# Patient Record
Sex: Male | Born: 1935 | Race: Black or African American | Hispanic: No | Marital: Married | Smoking: Former smoker
Health system: Southern US, Community
[De-identification: ages and names within clinical notes are randomized; demographics above are authoritative.]

## PROBLEM LIST (undated history)

## (undated) DIAGNOSIS — I428 Other cardiomyopathies: Secondary | ICD-10-CM

## (undated) DIAGNOSIS — Z9289 Personal history of other medical treatment: Secondary | ICD-10-CM

## (undated) DIAGNOSIS — IMO0001 Reserved for inherently not codable concepts without codable children: Secondary | ICD-10-CM

## (undated) DIAGNOSIS — K635 Polyp of colon: Secondary | ICD-10-CM

## (undated) DIAGNOSIS — M1990 Unspecified osteoarthritis, unspecified site: Secondary | ICD-10-CM

## (undated) DIAGNOSIS — C61 Malignant neoplasm of prostate: Secondary | ICD-10-CM

## (undated) DIAGNOSIS — J189 Pneumonia, unspecified organism: Secondary | ICD-10-CM

## (undated) DIAGNOSIS — C169 Malignant neoplasm of stomach, unspecified: Principal | ICD-10-CM

## (undated) DIAGNOSIS — D649 Anemia, unspecified: Secondary | ICD-10-CM

## (undated) DIAGNOSIS — I10 Essential (primary) hypertension: Secondary | ICD-10-CM

## (undated) DIAGNOSIS — C163 Malignant neoplasm of pyloric antrum: Secondary | ICD-10-CM

## (undated) DIAGNOSIS — E785 Hyperlipidemia, unspecified: Secondary | ICD-10-CM

## (undated) DIAGNOSIS — I5022 Chronic systolic (congestive) heart failure: Secondary | ICD-10-CM

## (undated) DIAGNOSIS — I447 Left bundle-branch block, unspecified: Secondary | ICD-10-CM

## (undated) DIAGNOSIS — J449 Chronic obstructive pulmonary disease, unspecified: Secondary | ICD-10-CM

## (undated) DIAGNOSIS — Z95810 Presence of automatic (implantable) cardiac defibrillator: Secondary | ICD-10-CM

## (undated) HISTORY — DX: Chronic obstructive pulmonary disease, unspecified: J44.9

## (undated) HISTORY — DX: Hyperlipidemia, unspecified: E78.5

## (undated) HISTORY — DX: Essential (primary) hypertension: I10

## (undated) HISTORY — DX: Polyp of colon: K63.5

## (undated) HISTORY — PX: COLONOSCOPY: SHX174

## (undated) HISTORY — DX: Malignant neoplasm of stomach, unspecified: C16.9

## (undated) HISTORY — DX: Malignant neoplasm of prostate: C61

## (undated) HISTORY — DX: Unspecified osteoarthritis, unspecified site: M19.90

## (undated) HISTORY — PX: PROSTATECTOMY: SHX69

---

## 2000-06-01 ENCOUNTER — Encounter: Admission: RE | Admit: 2000-06-01 | Discharge: 2000-06-01 | Payer: Self-pay | Admitting: Family Medicine

## 2000-06-01 ENCOUNTER — Encounter: Payer: Self-pay | Admitting: Family Medicine

## 2003-07-05 ENCOUNTER — Ambulatory Visit (HOSPITAL_COMMUNITY): Admission: RE | Admit: 2003-07-05 | Discharge: 2003-07-06 | Payer: Self-pay | Admitting: Internal Medicine

## 2004-07-16 ENCOUNTER — Ambulatory Visit: Payer: Self-pay | Admitting: Internal Medicine

## 2004-08-22 ENCOUNTER — Ambulatory Visit: Payer: Self-pay | Admitting: Internal Medicine

## 2004-09-06 ENCOUNTER — Ambulatory Visit: Payer: Self-pay | Admitting: Internal Medicine

## 2004-12-11 ENCOUNTER — Ambulatory Visit: Payer: Self-pay | Admitting: Internal Medicine

## 2006-06-03 ENCOUNTER — Ambulatory Visit: Payer: Self-pay | Admitting: Internal Medicine

## 2006-11-15 ENCOUNTER — Inpatient Hospital Stay (HOSPITAL_COMMUNITY): Admission: EM | Admit: 2006-11-15 | Discharge: 2006-11-19 | Payer: Self-pay | Admitting: *Deleted

## 2006-11-16 ENCOUNTER — Encounter (INDEPENDENT_AMBULATORY_CARE_PROVIDER_SITE_OTHER): Payer: Self-pay | Admitting: Internal Medicine

## 2006-12-01 ENCOUNTER — Encounter: Admission: RE | Admit: 2006-12-01 | Discharge: 2006-12-01 | Payer: Self-pay | Admitting: Family Medicine

## 2007-06-02 DIAGNOSIS — J42 Unspecified chronic bronchitis: Secondary | ICD-10-CM

## 2007-06-02 DIAGNOSIS — J449 Chronic obstructive pulmonary disease, unspecified: Secondary | ICD-10-CM

## 2007-06-03 ENCOUNTER — Ambulatory Visit: Payer: Self-pay | Admitting: Internal Medicine

## 2008-04-05 ENCOUNTER — Ambulatory Visit: Payer: Self-pay | Admitting: Internal Medicine

## 2008-04-05 DIAGNOSIS — E785 Hyperlipidemia, unspecified: Secondary | ICD-10-CM

## 2008-04-05 DIAGNOSIS — I10 Essential (primary) hypertension: Secondary | ICD-10-CM | POA: Insufficient documentation

## 2008-04-05 DIAGNOSIS — Z860100 Personal history of colon polyps, unspecified: Secondary | ICD-10-CM | POA: Insufficient documentation

## 2008-04-05 LAB — CONVERTED CEMR LAB: HDL goal, serum: 40 mg/dL

## 2008-04-19 ENCOUNTER — Ambulatory Visit: Payer: Self-pay | Admitting: Internal Medicine

## 2008-04-27 ENCOUNTER — Ambulatory Visit: Payer: Self-pay | Admitting: Internal Medicine

## 2008-04-27 DIAGNOSIS — Z8546 Personal history of malignant neoplasm of prostate: Secondary | ICD-10-CM | POA: Insufficient documentation

## 2008-04-27 DIAGNOSIS — E118 Type 2 diabetes mellitus with unspecified complications: Secondary | ICD-10-CM | POA: Insufficient documentation

## 2008-04-27 DIAGNOSIS — I447 Left bundle-branch block, unspecified: Secondary | ICD-10-CM | POA: Insufficient documentation

## 2008-05-04 ENCOUNTER — Ambulatory Visit: Payer: Self-pay

## 2008-05-05 DIAGNOSIS — C61 Malignant neoplasm of prostate: Secondary | ICD-10-CM

## 2008-05-05 HISTORY — PX: CATARACT EXTRACTION W/ INTRAOCULAR LENS  IMPLANT, BILATERAL: SHX1307

## 2008-05-05 HISTORY — PX: ROBOT ASSISTED LAPAROSCOPIC RADICAL PROSTATECTOMY: SHX5141

## 2008-05-05 HISTORY — DX: Malignant neoplasm of prostate: C61

## 2008-05-11 ENCOUNTER — Telehealth: Payer: Self-pay | Admitting: Internal Medicine

## 2008-05-17 ENCOUNTER — Telehealth: Payer: Self-pay | Admitting: Internal Medicine

## 2008-05-31 ENCOUNTER — Ambulatory Visit: Payer: Self-pay | Admitting: Internal Medicine

## 2008-06-05 ENCOUNTER — Encounter: Payer: Self-pay | Admitting: Internal Medicine

## 2008-06-09 ENCOUNTER — Encounter: Payer: Self-pay | Admitting: Internal Medicine

## 2008-07-04 ENCOUNTER — Ambulatory Visit: Admission: RE | Admit: 2008-07-04 | Discharge: 2008-08-17 | Payer: Self-pay | Admitting: Radiation Oncology

## 2008-07-05 ENCOUNTER — Encounter: Payer: Self-pay | Admitting: Internal Medicine

## 2008-07-10 ENCOUNTER — Ambulatory Visit (HOSPITAL_COMMUNITY): Admission: RE | Admit: 2008-07-10 | Discharge: 2008-07-10 | Payer: Self-pay | Admitting: Urology

## 2008-08-02 ENCOUNTER — Inpatient Hospital Stay (HOSPITAL_COMMUNITY): Admission: RE | Admit: 2008-08-02 | Discharge: 2008-08-03 | Payer: Self-pay | Admitting: Urology

## 2008-08-02 ENCOUNTER — Encounter (INDEPENDENT_AMBULATORY_CARE_PROVIDER_SITE_OTHER): Payer: Self-pay | Admitting: Urology

## 2008-09-26 ENCOUNTER — Ambulatory Visit: Payer: Self-pay | Admitting: Internal Medicine

## 2008-09-28 ENCOUNTER — Ambulatory Visit: Payer: Self-pay | Admitting: Internal Medicine

## 2008-10-12 ENCOUNTER — Ambulatory Visit: Payer: Self-pay | Admitting: Internal Medicine

## 2008-10-26 ENCOUNTER — Ambulatory Visit: Payer: Self-pay | Admitting: Internal Medicine

## 2008-10-26 ENCOUNTER — Encounter: Payer: Self-pay | Admitting: Internal Medicine

## 2008-10-26 LAB — HM COLONOSCOPY

## 2008-10-28 ENCOUNTER — Encounter: Payer: Self-pay | Admitting: Internal Medicine

## 2009-05-02 ENCOUNTER — Ambulatory Visit: Payer: Self-pay | Admitting: Internal Medicine

## 2009-05-02 DIAGNOSIS — E8881 Metabolic syndrome: Secondary | ICD-10-CM | POA: Insufficient documentation

## 2009-05-03 ENCOUNTER — Encounter: Payer: Self-pay | Admitting: Internal Medicine

## 2009-06-04 ENCOUNTER — Ambulatory Visit: Payer: Self-pay | Admitting: Internal Medicine

## 2009-06-04 DIAGNOSIS — J019 Acute sinusitis, unspecified: Secondary | ICD-10-CM | POA: Insufficient documentation

## 2009-07-16 ENCOUNTER — Telehealth: Payer: Self-pay | Admitting: Internal Medicine

## 2009-07-17 ENCOUNTER — Telehealth: Payer: Self-pay | Admitting: Internal Medicine

## 2009-07-20 ENCOUNTER — Ambulatory Visit: Payer: Self-pay | Admitting: Internal Medicine

## 2009-08-15 ENCOUNTER — Telehealth (INDEPENDENT_AMBULATORY_CARE_PROVIDER_SITE_OTHER): Payer: Self-pay | Admitting: *Deleted

## 2009-08-30 ENCOUNTER — Ambulatory Visit: Payer: Self-pay | Admitting: Internal Medicine

## 2009-11-29 ENCOUNTER — Telehealth: Payer: Self-pay | Admitting: Internal Medicine

## 2009-12-03 ENCOUNTER — Ambulatory Visit: Payer: Self-pay | Admitting: Internal Medicine

## 2009-12-26 ENCOUNTER — Encounter: Payer: Self-pay | Admitting: Internal Medicine

## 2010-01-17 ENCOUNTER — Ambulatory Visit: Payer: Self-pay | Admitting: Internal Medicine

## 2010-05-07 ENCOUNTER — Ambulatory Visit
Admission: RE | Admit: 2010-05-07 | Discharge: 2010-05-07 | Payer: Self-pay | Source: Home / Self Care | Attending: Internal Medicine | Admitting: Internal Medicine

## 2010-05-07 ENCOUNTER — Encounter: Payer: Self-pay | Admitting: Internal Medicine

## 2010-05-07 LAB — CONVERTED CEMR LAB
BUN: 15 mg/dL (ref 6–23)
Basophils Absolute: 0 10*3/uL (ref 0.0–0.1)
Chloride: 103 meq/L (ref 96–112)
Cholesterol: 166 mg/dL (ref 0–200)
Creatinine,U: 105.1 mg/dL
Eosinophils Absolute: 0.1 10*3/uL (ref 0.0–0.7)
Glucose, Bld: 103 mg/dL — ABNORMAL HIGH (ref 70–99)
HCT: 38.6 % — ABNORMAL LOW (ref 39.0–52.0)
Hgb A1c MFr Bld: 6.8 % — ABNORMAL HIGH (ref 4.6–6.5)
Ketones, ur: NEGATIVE mg/dL
Lymphs Abs: 1.4 10*3/uL (ref 0.7–4.0)
MCV: 82.9 fL (ref 78.0–100.0)
Microalb, Ur: 2.9 mg/dL — ABNORMAL HIGH (ref 0.0–1.9)
Monocytes Absolute: 0.4 10*3/uL (ref 0.1–1.0)
Platelets: 206 10*3/uL (ref 150.0–400.0)
Potassium: 4.2 meq/L (ref 3.5–5.1)
RDW: 15.5 % — ABNORMAL HIGH (ref 11.5–14.6)
Specific Gravity, Urine: 1.02 (ref 1.000–1.030)
TSH: 1.71 microintl units/mL (ref 0.35–5.50)
Total Bilirubin: 0.6 mg/dL (ref 0.3–1.2)
Triglycerides: 102 mg/dL (ref 0.0–149.0)
Urine Glucose: NEGATIVE mg/dL
Urobilinogen, UA: 0.2 (ref 0.0–1.0)
VLDL: 20.4 mg/dL (ref 0.0–40.0)

## 2010-05-08 ENCOUNTER — Encounter: Payer: Self-pay | Admitting: Internal Medicine

## 2010-06-02 LAB — CONVERTED CEMR LAB
ALT: 19 units/L (ref 0–53)
Albumin: 3.9 g/dL (ref 3.5–5.2)
Alkaline Phosphatase: 78 units/L (ref 39–117)
Alkaline Phosphatase: 84 units/L (ref 39–117)
BUN: 10 mg/dL (ref 6–23)
BUN: 13 mg/dL (ref 6–23)
Basophils Absolute: 0 10*3/uL (ref 0.0–0.1)
Basophils Relative: 1.1 % (ref 0.0–3.0)
Bilirubin, Direct: 0.1 mg/dL (ref 0.0–0.3)
Bilirubin, Direct: 0.2 mg/dL (ref 0.0–0.3)
Calcium: 9.2 mg/dL (ref 8.4–10.5)
Calcium: 9.3 mg/dL (ref 8.4–10.5)
Cholesterol: 163 mg/dL (ref 0–200)
Creatinine, Ser: 1.1 mg/dL (ref 0.4–1.5)
Crystals: NEGATIVE
Eosinophils Absolute: 0.1 10*3/uL (ref 0.0–0.7)
Eosinophils Relative: 1.7 % (ref 0.0–5.0)
GFR calc Af Amer: 95 mL/min
GFR calc non Af Amer: 78 mL/min
GFR calc non Af Amer: 84.61 mL/min (ref >60)
Glucose, Bld: 115 mg/dL — ABNORMAL HIGH (ref 70–99)
HCT: 41.7 % (ref 39.0–52.0)
HDL: 38.4 mg/dL — ABNORMAL LOW (ref >39.0)
Hemoglobin, Urine: NEGATIVE
Hgb A1c MFr Bld: 6.5 % (ref 4.6–6.5)
Hgb A1c MFr Bld: 6.7 % — ABNORMAL HIGH (ref 4.6–6.0)
Ketones, ur: NEGATIVE mg/dL
LDL Cholesterol: 92 mg/dL (ref 0–99)
Leukocytes, UA: NEGATIVE
Leukocytes, UA: NEGATIVE
Lymphocytes Relative: 35.3 % (ref 12.0–46.0)
MCHC: 33.3 g/dL (ref 30.0–36.0)
MCV: 83.5 fL (ref 78.0–100.0)
Microalb Creat Ratio: 10.6 mg/g (ref 0.0–30.0)
Microalb, Ur: 1.9 mg/dL (ref 0.0–1.9)
Monocytes Absolute: 0.4 10*3/uL (ref 0.1–1.0)
Monocytes Absolute: 0.5 10*3/uL (ref 0.1–1.0)
Monocytes Relative: 10.5 % (ref 3.0–12.0)
Neutrophils Relative %: 45.8 % (ref 43.0–77.0)
Neutrophils Relative %: 51.4 % (ref 43.0–77.0)
Nitrite: NEGATIVE
Platelets: 218 10*3/uL (ref 150–400)
Platelets: 225 10*3/uL (ref 150.0–400.0)
Potassium: 4.2 meq/L (ref 3.5–5.1)
RBC / HPF: NONE SEEN
RBC: 4.8 M/uL (ref 4.22–5.81)
RDW: 13.7 % (ref 11.5–14.6)
Sodium: 140 meq/L (ref 135–145)
Specific Gravity, Urine: 1.02 (ref 1.000–1.03)
Specific Gravity, Urine: 1.02 (ref 1.000–1.030)
TSH: 1.14 microintl units/mL (ref 0.35–5.50)
Total Bilirubin: 0.5 mg/dL (ref 0.3–1.2)
Total CHOL/HDL Ratio: 4
Total Protein, Urine: NEGATIVE mg/dL
Total Protein: 7 g/dL (ref 6.0–8.3)
Triglycerides: 116 mg/dL (ref 0–149)
Triglycerides: 141 mg/dL (ref 0.0–149.0)
Urobilinogen, UA: 0.2 (ref 0.0–1.0)
VLDL: 23 mg/dL (ref 0–40)
VLDL: 28.2 mg/dL (ref 0.0–40.0)
WBC: 3.8 10*3/uL — ABNORMAL LOW (ref 4.5–10.5)
WBC: 4.3 10*3/uL — ABNORMAL LOW (ref 4.5–10.5)
pH: 5.5 (ref 5.0–8.0)

## 2010-06-03 ENCOUNTER — Ambulatory Visit: Admit: 2010-06-03 | Payer: Self-pay | Admitting: Internal Medicine

## 2010-06-04 NOTE — Assessment & Plan Note (Signed)
Summary: F/U ///KP   Primary Provider/Referring Provider:  Etta Grandchild MD  CC:  Follow up visit-COPD and Chronic Bronchitis-SOB and wheezing at times-usually with activity..  History of Present Illness: 06/03/07 History of Present Illness: Asthma/ copd FEV1/FVC 0.59 08/22/04 1 year follow up- Had double pneumonia hosp 4 days last July. Says wife c/o his snore. He denies daytime sleepiness.. Asleep watching TV at night.He will ask her if he stops breathing. Using Combivent as needed about once a day currently. No fever, but has cough productive trace yellow- saw trace of heme? today. Last cxr was during pneumonia during the summer. No chest pain.  July 20, 2009- Asthma/ COPD Wife called complaining that for the last 3 weeks or so he has been raspy and wheezing especially at night. He had a chest cold starting around then and says this pattern is not new for him when he catches a cold. Coughs a little clear mucus. He snores some but this is different. Denies significant dyspnea, chest pain or palpitations, edema or reflux.  August 30, 2009- Asthma/ COPD Wife sends him in complaining that lying down he has to clear his throat a lot. Antibiotics didn't help.  Sitting in church may bring this out as well. Uses cough drops. He doesn't recognize reflux or postnasal drip. He thinks an inhaler might have helped long ago. Denies getting hoarse. Sleeps on 2 pilows.  December 03, 2009- Asthma/ COPD He says since last here the chest congestion/ wheeze have cleared up and he is doing well now. The rescue inhaler seemed to make  the difference and was used regularly at first, then only occasionally. He has now run out  He sleeps on 2 pillows, without raising the HOB. We had suggested this as a way to explore whether acid reflux was causing his bronchitis.   Preventive Screening-Counseling & Management  Alcohol-Tobacco     Alcohol drinks/day: 0     Smoking Status: quit     Year Quit: 2004     Pack  years: 40 years  1 pack a day     Passive Smoke Exposure: no  Current Medications (verified): 1)  Bayer Low Strength 81 Mg  Tbec (Aspirin) .... Take 1 Tablet By Mouth Once A Day 2)  Azor 10-40 Mg Tabs (Amlodipine-Olmesartan) .... Once Daily 3)  Lipitor 10 Mg Tabs (Atorvastatin Calcium) .... Take 1 Tablet By Mouth Once A Day 4)  Centrum Silver  Tabs (Multiple Vitamins-Minerals) 5)  Vitamin E 400 Unit Caps (Vitamin E) .... Take 1 By Mouth Once Daily 6)  Folic Acid 400 Mcg Tabs (Folic Acid) .... Every Evening  Allergies (verified): 1)  ! Ace Inhibitors  Past History:  Past Medical History: Last updated: 07/20/2009 Asthma COPD FEV1 2.68/ 72%, FEV1/FVC 0.63 (08/22/04) Colonic polyps, hx of Hyperlipidemia Hypertension  Past Surgical History: Last updated: 07/20/2009 Prostatectomy- Robot laparoscopic- for prostate cancer 2010  Family History: Last updated: 07/20/2009 Family History of Arthritis Family History of Prostate CA 1st degree relative <50 Father- died prostate cancer, colon cancer Mother- esophageal stricture- died  Social History: Last updated: 04/05/2008 Patient states former smoker.  Married Alcohol use-no Drug use-no Regular exercise-no  Risk Factors: Alcohol Use: 0 (12/03/2009) Exercise: no (04/05/2008)  Risk Factors: Smoking Status: quit (12/03/2009) Passive Smoke Exposure: no (12/03/2009)  Review of Systems      See HPI       The patient complains of shortness of breath with activity.  The patient denies shortness of breath at  rest, productive cough, non-productive cough, coughing up blood, chest pain, irregular heartbeats, acid heartburn, indigestion, loss of appetite, weight change, abdominal pain, difficulty swallowing, sore throat, tooth/dental problems, headaches, nasal congestion/difficulty breathing through nose, and sneezing.         Dyspneic using a pushmower  Vital Signs:  Patient profile:   76 year old male Height:      72.5  inches Weight:      251.25 pounds BMI:     33.73 O2 Sat:      100 % on Room air Pulse rate:   65 / minute BP sitting:   124 / 70  (left arm) Cuff size:   large  Vitals Entered By: Reynaldo Minium CMA (December 03, 2009 1:32 PM)  O2 Flow:  Room air CC: Follow up visit-COPD and Chronic Bronchitis-SOB and wheezing at times-usually with activity.   Physical Exam  Additional Exam:  General: A/Ox3; pleasant and cooperative, NAD, He seem sconfortable and casual today. SKIN: no rash, lesions NODES: no lymphadenopathy HEENT: [place]/AT, EOM- WNL, Conjuctivae- clear, PERRLA, TM-WNL, Nose- clear, Throat- clear and wnl, dentures, Mallampati  II NECK: Supple w/ fair ROM, JVD-trace, normal carotid impulses w/o bruits Thyroid- normal to palpation, no hoarseness or stridor. CHEST:mild wheeze right upper back, unlabored without cough HEART: RRR, no m/g/r heard, ABDOMEN: Soft and nl;  AVW:UJWJ, nl pulses, no edema  NEURO: Grossly intact to observation    .   Impression & Recommendations:  Problem # 1:  COPD (ICD-496) Chronic bronchitis. There is an asthma component which should be controlled ok for now with a rescue inhaler. He is worried about the cost and we discussed this along with options.   Problem # 2:  SINUSITIS- ACUTE-NOS (ICD-461.9) Sinusitis has resolved and is asymptomatic now. We discussed recurrence, and availability of Neti pot.  Medications Added to Medication List This Visit: 1)  Proair Hfa 108 (90 Base) Mcg/act Aers (Albuterol sulfate) .... 2 puffs four times a day as needed rescue  Other Orders: Est. Patient Level III (19147)  Patient Instructions: 1)  Please schedule a follow-up appointment in 6 months. 2)  Script for rescue inhaler Proair/ albuterol. Sample if available. Prescriptions: PROAIR HFA 108 (90 BASE) MCG/ACT AERS (ALBUTEROL SULFATE) 2 puffs four times a day as needed rescue  #1 x prn   Entered and Authorized by:   Waymon Budge MD   Signed by:   Waymon Budge MD on 12/03/2009   Method used:   Print then Give to Patient   RxID:   [id number]

## 2010-06-04 NOTE — Assessment & Plan Note (Signed)
Summary: COUGHING/ MBW   Primary Provider/Referring Provider:  Etta Grandchild MD  CC:  Coughing-prod at times-yellow in color; Per Spouse pt is wheezing more at night. .  History of Present Illness: 06/03/07 History of Present Illness: Asthma/ copd FEV1/FVC 0.59 08/22/04 1 year follow up- Had double pneumonia hosp 4 days last July. Says wife c/o his snore. He denies daytime sleepiness.. Asleep watching TV at night.He will ask her if he stops breathing. Using Combivent as needed about once a day currently. No fever, but has cough productive trace yellow- saw trace of heme? today. Last cxr was during pneumonia during the summer. No chest pain.  July 20, 2009- Asthma/ COPD Wife called complaining that for the last 3 weeks or so he has been raspy and wheezing especially at night. He had a chest cold starting around then and says this pattern is not new for him when he catches a cold. Coughs a little clear mucus. He snores some but this is different. Denies significant dyspnea, chest pain or palpitations, edema or reflux.     Current Medications (verified): 1)  Bayer Low Strength 81 Mg  Tbec (Aspirin) .... Take 1 Tablet By Mouth Once A Day 2)  Azor 10-40 Mg Tabs (Amlodipine-Olmesartan) .... Once Daily 3)  Lipitor 10 Mg Tabs (Atorvastatin Calcium) .... Take 1 Tablet By Mouth Once A Day 4)  Centrum Silver  Tabs (Multiple Vitamins-Minerals) 5)  Vitamin E 400 Unit Caps (Vitamin E) .... Take 1 By Mouth Once Daily 6)  Folic Acid 400 Mcg Tabs (Folic Acid) .... Every Evening  Allergies (verified): 1)  ! Ace Inhibitors  Past History:  Family History: Last updated: 07/20/2009 Family History of Arthritis Family History of Prostate CA 1st degree relative <50 Father- died prostate cancer, colon cancer Mother- esophageal stricture- died  Social History: Last updated: 04/05/2008 Patient states former smoker.  Married Alcohol use-no Drug use-no Regular exercise-no  Risk Factors: Alcohol  Use: 0 (06/04/2009) Exercise: no (04/05/2008)  Risk Factors: Smoking Status: quit (06/04/2009) Passive Smoke Exposure: no (06/04/2009)  Past Medical History: Asthma COPD FEV1 2.68/ 72%, FEV1/FVC 0.63 (08/22/04) Colonic polyps, hx of Hyperlipidemia Hypertension  Past Surgical History: Prostatectomy- Robot laparoscopic- for prostate cancer 2010  Family History: Family History of Arthritis Family History of Prostate CA 1st degree relative <50 Father- died prostate cancer, colon cancer Mother- esophageal stricture- died  Review of Systems      See HPI       The patient complains of prolonged cough.  The patient denies anorexia, fever, weight loss, weight gain, vision loss, decreased hearing, hoarseness, chest pain, syncope, dyspnea on exertion, peripheral edema, headaches, hemoptysis, abdominal pain, and severe indigestion/heartburn.    Vital Signs:  Patient profile:   76 year old male Height:      62.5 inches Weight:      248 pounds O2 Sat:      95 % on Room air Pulse rate:   85 / minute BP sitting:   140 / 78  (right arm) Cuff size:   large  Vitals Entered By: Reynaldo Minium CMA (July 20, 2009 11:07 AM)  O2 Flow:  Room air  Physical Exam  Additional Exam:  General: A/Ox3; pleasant and cooperative, NAD, SKIN: no rash, lesions NODES: no lymphadenopathy HEENT: [place]/AT, EOM- WNL, Conjuctivae- clear, PERRLA, TM-WNL, Nose- clear, Throat- clear and wnl NECK: Supple w/ fair ROM, JVD-trace, normal carotid impulses w/o bruits Thyroid- normal to palpation, dentures, Mallampati  II CHEST:mild rhonchi right mid back, unlabored  without cough HEART: RRR, no m/g/r heard, ABDOMEN: Soft and nl;  DVV:OHYW, nl pulses, no edema  NEURO: Grossly intact to observation    .   Impression & Recommendations:  Problem # 1:  BRONCHITIS, CHRONIC (ICD-491.9)  Exacerbation of chronic bronchitis clinically. If it fails to clear we will want to get CXR. He took an antibiotic a month ago when  he had a cold.  Medications Added to Medication List This Visit: 1)  Vitamin E 400 Unit Caps (Vitamin e) .... Take 1 by mouth once daily  Other Orders: Est. Patient Level III (73710) Admin of Therapeutic Inj  intramuscular or subcutaneous (62694) Depo- Medrol 80mg  (J1040) Nebulizer Tx (85462)  Patient Instructions: 1)  Please schedule a follow-up appointment in 1 year. 2)  neb neo xop 1.25 3)  depo 80 4)  If you don't get clear in your chest in the next couple of weeks please let me know.      Medication Administration  Injection # 1:    Medication: Depo- Medrol 80mg     Diagnosis: BRONCHITIS, CHRONIC (ICD-491.9)    Route: sq1    Site: RUOQ gluteus    Exp Date: 03/2012    Lot #: [id number]    Mfr: Pharmacia    Patient tolerated injection without complications    Given by: Reynaldo Minium CMA (July 20, 2009 12:07 PM)  Medication # 1:    Medication: Xopenex 1.25mg     Diagnosis: BRONCHITIS, CHRONIC (ICD-491.9)    Dose: 1 vial    Route: inhaled    Exp Date: 01/2010    Lot #: [id number]    Mfr: Sepracor    Patient tolerated medication without complications    Given by: Reynaldo Minium CMA (July 20, 2009 12:07 PM)  Orders Added: 1)  Est. Patient Level III [38182] 2)  Admin of Therapeutic Inj  intramuscular or subcutaneous [96372] 3)  Depo- Medrol 80mg  [J1040] 4)  Nebulizer Tx [99371]

## 2010-06-04 NOTE — Assessment & Plan Note (Signed)
Summary: COUGH--STC   Vital Signs:  Patient profile:   76 year old male Height:      62.5 inches Weight:      250 pounds BMI:     45.16 O2 Sat:      97 % on Room air Temp:     98.8 degrees F oral Pulse rate:   82 / minute Pulse rhythm:   regular Resp:     16 per minute BP sitting:   140 / 68  (left arm) Cuff size:   large  Vitals Entered By: Rock Nephew CMA (June 04, 2009 3:26 PM)  Nutrition Counseling: Patient's BMI is greater than 25 and therefore counseled on weight management options.  O2 Flow:  Room air  Primary Care Provider:  Etta Grandchild MD  CC:  URI symptoms.  History of Present Illness:  URI Symptoms      This is a 76 year old man who presents with URI symptoms.  The symptoms began 2 weeks ago.  The severity is described as moderate.  The patient reports nasal congestion, purulent nasal discharge, and dry cough, but denies productive cough, earache, and sick contacts.  The patient denies fever, stiff neck, dyspnea, wheezing, rash, vomiting, diarrhea, and use of an antipyretic.  The patient also reports sneezing.  The patient denies headache, muscle aches, and severe fatigue.  Risk factors for Strep sinusitis include double sickening.  The patient denies the following risk factors for Strep sinusitis: tender adenopathy and absence of cough.    Hypertension History:      He denies headache, chest pain, palpitations, dyspnea with exertion, orthopnea, peripheral edema, visual symptoms, neurologic problems, syncope, and side effects from treatment.  He notes no problems with any antihypertensive medication side effects.        Positive major cardiovascular risk factors include male age 74 years old or older, diabetes, hyperlipidemia, and hypertension.  Negative major cardiovascular risk factors include negative family history for ischemic heart disease and non-tobacco-user status.        Further assessment for target organ damage reveals no history of ASHD, cardiac  end-organ damage (CHF/LVH), stroke/TIA, peripheral vascular disease, renal insufficiency, or hypertensive retinopathy.     Preventive Screening-Counseling & Management  Alcohol-Tobacco     Alcohol drinks/day: 0     Smoking Status: quit     Year Quit: 2004     Pack years: 40 years  1 pack a day     Passive Smoke Exposure: no  Hep-HIV-STD-Contraception     Hepatitis Risk: no risk noted     HIV Risk: no     STD Risk: no risk noted  Medications Prior to Update: 1)  Bayer Low Strength 81 Mg  Tbec (Aspirin) .... Take 1 Tablet By Mouth Once A Day 2)  Azor 10-40 Mg Tabs (Amlodipine-Olmesartan) .... Once Daily 3)  Lipitor 10 Mg Tabs (Atorvastatin Calcium) .... Take 1 Tablet By Mouth Once A Day 4)  Centrum Silver  Tabs (Multiple Vitamins-Minerals) 5)  Vitamin E .... Daily 6)  Folic Acid 400 Mcg Tabs (Folic Acid) .... Every Evening  Current Medications (verified): 1)  Bayer Low Strength 81 Mg  Tbec (Aspirin) .... Take 1 Tablet By Mouth Once A Day 2)  Azor 10-40 Mg Tabs (Amlodipine-Olmesartan) .... Once Daily 3)  Lipitor 10 Mg Tabs (Atorvastatin Calcium) .... Take 1 Tablet By Mouth Once A Day 4)  Centrum Silver  Tabs (Multiple Vitamins-Minerals) 5)  Vitamin E .... Daily 6)  Folic Acid 400  Mcg Tabs (Folic Acid) .... Every Evening  Allergies (verified): 1)  ! Ace Inhibitors  Past History:  Past Medical History: Reviewed history from 04/05/2008 and no changes required. Astma COPD Colonic polyps, hx of Hyperlipidemia Hypertension  Past Surgical History: Reviewed history from 04/05/2008 and no changes required. Denies surgical history  Family History: Reviewed history from 04/05/2008 and no changes required. Family History of Arthritis Family History of Prostate CA 1st degree relative <50  Social History: Reviewed history from 04/05/2008 and no changes required. Patient states former smoker.  Married Alcohol use-no Drug use-no Regular exercise-no Hepatitis Risk:  no risk  noted STD Risk:  no risk noted  Review of Systems  The patient denies anorexia, fever, decreased hearing, hoarseness, prolonged cough, hemoptysis, abdominal pain, suspicious skin lesions, and enlarged lymph nodes.   ENT:  Complains of nasal congestion, nosebleeds, postnasal drainage, and sinus pressure; denies decreased hearing, difficulty swallowing, ear discharge, earache, and sore throat.  Physical Exam  General:  alert, well-developed, well-nourished, well-hydrated, and overweight-appearing.   Head:  normocephalic and atraumatic.   Ears:  R ear normal and L ear normal.   Nose:  no airflow obstruction, no intranasal foreign body, no nasal polyps, no active bleeding or clots, no sinus percussion tenderness, no septum abnormalities, nasal dischargemucosal pallor, mucosal erythema, mucosal edema, and mucosal friability.   Mouth:  Oral mucosa and oropharynx without lesions or exudates.  Teeth in good repair. Neck:  supple, full ROM, no masses, no thyromegaly, no thyroid nodules or tenderness, no JVD, normal carotid upstroke, no carotid bruits, and no cervical lymphadenopathy.   Lungs:  Normal respiratory effort, chest expands symmetrically. Lungs are clear to auscultation, no crackles or wheezes. Heart:  Normal rate and regular rhythm. S1 and S2 normal without gallop, murmur, click, rub or other extra sounds. Abdomen:  Bowel sounds positive,abdomen soft and non-tender without masses, organomegaly or hernias noted. Msk:  normal ROM, no joint tenderness, no joint swelling, no joint warmth, no redness over joints, and no joint deformities.   Pulses:  R and L carotid,radial,femoral,dorsalis pedis and posterior tibial pulses are full and equal bilaterally Extremities:  trace left pedal edema and trace right pedal edema.   Neurologic:  No cranial nerve deficits noted. Station and gait are normal. Plantar reflexes are down-going bilaterally. DTRs are symmetrical throughout. Sensory, motor and  coordinative functions appear intact. Skin:  Intact without suspicious lesions or rashes Cervical Nodes:  no anterior cervical adenopathy and no posterior cervical adenopathy.   Axillary Nodes:  no R axillary adenopathy and no L axillary adenopathy.   Inguinal Nodes:  no R inguinal adenopathy and no L inguinal adenopathy.   Psych:  Cognition and judgment appear intact. Alert and cooperative with normal attention span and concentration. No apparent delusions, illusions, hallucinations  Diabetes Management Exam:    Foot Exam (with socks and/or shoes not present):       Sensory-Pinprick/Light touch:          Left medial foot (L-4): normal          Left dorsal foot (L-5): normal          Left lateral foot (S-1): normal          Right medial foot (L-4): normal          Right dorsal foot (L-5): normal          Right lateral foot (S-1): normal       Sensory-Monofilament:  Left foot: normal          Right foot: normal       Inspection:          Left foot: normal          Right foot: normal       Nails:          Left foot: normal          Right foot: normal   Impression & Recommendations:  Problem # 1:  SINUSITIS- ACUTE-NOS (ICD-461.9) Assessment New  His updated medication list for this problem includes:    Amoxicillin 500 Mg Cap (Amoxicillin) .Marland Kitchen... Take 1 capsule by mouth three times a day x 10 days  Problem # 2:  HYPERTENSION (ICD-401.9) Assessment: Improved  His updated medication list for this problem includes:    Azor 10-40 Mg Tabs (Amlodipine-olmesartan) ..... Once daily  BP today: 140/68 Prior BP: 144/88 (05/02/2009)  Prior 10 Yr Risk Heart Disease: 47 % (05/02/2009)  Labs Reviewed: K+: 4.0 (05/02/2009) Creat: : 1.1 (05/02/2009)   Chol: 163 (05/02/2009)   HDL: 42.60 (05/02/2009)   LDL: 92 (05/02/2009)   TG: 141.0 (05/02/2009)  Problem # 3:  DIABETES-TYPE 2 (ICD-250.00) Assessment: Improved  His updated medication list for this problem includes:    Bayer  Low Strength 81 Mg Tbec (Aspirin) .Marland Kitchen... Take 1 tablet by mouth once a day    Azor 10-40 Mg Tabs (Amlodipine-olmesartan) ..... Once daily  Labs Reviewed: Creat: 1.1 (05/02/2009)     Last Eye Exam: normal (04/04/2009) Reviewed HgBA1c results: 6.5 (05/02/2009)  6.7 (04/27/2008)  Complete Medication List: 1)  Bayer Low Strength 81 Mg Tbec (Aspirin) .... Take 1 tablet by mouth once a day 2)  Azor 10-40 Mg Tabs (Amlodipine-olmesartan) .... Once daily 3)  Lipitor 10 Mg Tabs (Atorvastatin calcium) .... Take 1 tablet by mouth once a day 4)  Centrum Silver Tabs (Multiple vitamins-minerals) 5)  Vitamin E  .... Daily 6)  Folic Acid 400 Mcg Tabs (Folic acid) .... Every evening 7)  Amoxicillin 500 Mg Cap (Amoxicillin) .... Take 1 capsule by mouth three times a day x 10 days  Hypertension Assessment/Plan:      The patient's hypertensive risk group is category C: Target organ damage and/or diabetes.  His calculated 10 year risk of coronary heart disease is 27 %.  Today's blood pressure is 140/68.  His blood pressure goal is < 130/80.  Patient Instructions: 1)  Please schedule a follow-up appointment in 2 weeks. 2)  Take your antibiotic as prescribed until ALL of it is gone, but stop if you develop a rash or swelling and contact our office as soon as possible. 3)  Acute sinusitis symptoms for less than 10 days are not helped by antibiotics.Use warm moist compresses, and over the counter decongestants ( only as directed). Call if no improvement in 5-7 days, sooner if increasing pain, fever, or new symptoms. Prescriptions: AMOXICILLIN 500 MG CAP (AMOXICILLIN) Take 1 capsule by mouth three times a day X 10 days  #30 x 0   Entered and Authorized by:   Etta Grandchild MD   Signed by:   Etta Grandchild MD on 06/04/2009   Method used:   Electronically to        [facility] [id number]* (retail)       [address] [place], [place]  [address]       Ph: [phone]  Fax: [phone]   RxID:    [id number]

## 2010-06-04 NOTE — Miscellaneous (Signed)
Summary: PNA vaccine hx  Clinical Lists Changes

## 2010-06-04 NOTE — Progress Notes (Signed)
  Phone Note Call from Patient Call back at Home Phone [phone]   Caller: Patient Call For: Etta Grandchild MD Summary of Call: [name], 3 mos supply and LIPITOR, 3 mos supply pt needs mail order to "[facility]", phone # is [phone] or fax# [phone].  Initial call taken by: Verdell Face,  July 16, 2009 12:37 PM    Prescriptions: LIPITOR 10 MG TABS (ATORVASTATIN CALCIUM) Take 1 tablet by mouth once a day  #90 x 3   Entered by:   Ami Bullins CMA   Authorized by:   Etta Grandchild MD   Signed by:   Bill Salinas CMA on 07/16/2009   Method used:   Electronically to        [facility]* (mail-order)       [address]       [place], [place]  [address]       Ph: [phone]       Fax: [phone]   RxID:   [id number] AZOR 10-40 MG TABS (AMLODIPINE-OLMESARTAN) once daily  #90 x 3   Entered by:   Ami Bullins CMA   Authorized by:   Etta Grandchild MD   Signed by:   Bill Salinas CMA on 07/16/2009   Method used:   Electronically to        [facility]* (mail-order)       [address]       [place], [place]  [address]       Ph: [phone]       Fax: [phone]   RxID:   [id number]

## 2010-06-04 NOTE — Progress Notes (Signed)
Summary: nos appt  Phone Note Call from Patient   Caller: [email or web address]  Call For: [name] Summary of Call: Rsc nos from 7/27 to 8/1 @ 1:30p. Initial call taken by: Darletta Moll,  November 29, 2009 1:19 PM

## 2010-06-04 NOTE — Assessment & Plan Note (Signed)
Summary: pt not any better/2 rounds anti/apc   Primary Provider/Referring Provider:  Etta Grandchild MD  CC:  Accute visit-cough still-prod white in color; worse at night; 2 rounds of abx's already..  History of Present Illness: 06/03/07 History of Present Illness: Asthma/ copd FEV1/FVC 0.59 08/22/04 1 year follow up- Had double pneumonia hosp 4 days last July. Says wife c/o his snore. He denies daytime sleepiness.. Asleep watching TV at night.He will ask her if he stops breathing. Using Combivent as needed about once a day currently. No fever, but has cough productive trace yellow- saw trace of heme? today. Last cxr was during pneumonia during the summer. No chest pain.  July 20, 2009- Asthma/ COPD Wife called complaining that for the last 3 weeks or so he has been raspy and wheezing especially at night. He had a chest cold starting around then and says this pattern is not new for him when he catches a cold. Coughs a little clear mucus. He snores some but this is different. Denies significant dyspnea, chest pain or palpitations, edema or reflux.  August 30, 2009- Asthma/ COPD Wife sends him in complaining that lying down he has to clear his throat a lot. Antibiotics didn't help.  Sitting in church may bring this out as well. Uses cough drops. He doesn't recognize reflux or postnasal drip. He thinks an inhaler might have helped long ago. Denies getting hoarse. Sleeps on 2 pilows.       Current Medications (verified): 1)  Bayer Low Strength 81 Mg  Tbec (Aspirin) .... Take 1 Tablet By Mouth Once A Day 2)  Azor 10-40 Mg Tabs (Amlodipine-Olmesartan) .... Once Daily 3)  Lipitor 10 Mg Tabs (Atorvastatin Calcium) .... Take 1 Tablet By Mouth Once A Day 4)  Centrum Silver  Tabs (Multiple Vitamins-Minerals) 5)  Vitamin E 400 Unit Caps (Vitamin E) .... Take 1 By Mouth Once Daily 6)  Folic Acid 400 Mcg Tabs (Folic Acid) .... Every Evening  Allergies (verified): 1)  ! Ace Inhibitors  Past  History:  Past Medical History: Last updated: 07/20/2009 Asthma COPD FEV1 2.68/ 72%, FEV1/FVC 0.63 (08/22/04) Colonic polyps, hx of Hyperlipidemia Hypertension  Past Surgical History: Last updated: 07/20/2009 Prostatectomy- Robot laparoscopic- for prostate cancer 2010  Family History: Last updated: 07/20/2009 Family History of Arthritis Family History of Prostate CA 1st degree relative <50 Father- died prostate cancer, colon cancer Mother- esophageal stricture- died  Social History: Last updated: 04/05/2008 Patient states former smoker.  Married Alcohol use-no Drug use-no Regular exercise-no  Risk Factors: Alcohol Use: 0 (06/04/2009) Exercise: no (04/05/2008)  Risk Factors: Smoking Status: quit (06/04/2009) Passive Smoke Exposure: no (06/04/2009)  Review of Systems      See HPI       The patient complains of prolonged cough.  The patient denies anorexia, fever, weight loss, weight gain, vision loss, decreased hearing, hoarseness, chest pain, syncope, dyspnea on exertion, peripheral edema, headaches, hemoptysis, and severe indigestion/heartburn.    Vital Signs:  Patient profile:   76 year old male Height:      62.5 inches Weight:      242.50 pounds BMI:     43.80 O2 Sat:      95 % on Room air Pulse rate:   77 / minute BP sitting:   144 / 60  (right arm) Cuff size:   large  Vitals Entered By: Reynaldo Minium CMA (August 30, 2009 3:24 PM)  O2 Flow:  Room air  Physical Exam  Additional Exam:  General:  A/Ox3; pleasant and cooperative, NAD, SKIN: no rash, lesions NODES: no lymphadenopathy HEENT: [place]/AT, EOM- WNL, Conjuctivae- clear, PERRLA, TM-WNL, Nose- clear, Throat- clear and wnl, dentures, Mallampati  II NECK: Supple w/ fair ROM, JVD-trace, normal carotid impulses w/o bruits Thyroid- normal to palpation, no hoarseness or stridor. CHEST:mild rhonchi right mid back, unlabored without cough HEART: RRR, no m/g/r heard, ABDOMEN: Soft and nl;  ZOX:WRUE, nl  pulses, no edema  NEURO: Grossly intact to observation    .   Impression & Recommendations:  Problem # 1:  BRONCHITIS, CHRONIC (ICD-491.9)  Complaint of throat clearing is positional,  bothersome only to his wife. I can't get hx favoring reflux, sleep apnea or postnasal drip. It is positional which raises question of reflux.  We willl have him raise head of bed and try a rescue inhaler.  Other Orders: Est. Patient Level III (45409)  Patient Instructions: 1)  Please schedule a follow-up appointment in 3 months. 2)  Sample rescue albuterol inhaler: 2 puffs, 4 times daily as needed. 3)  Conisder trying for a time with the head of your bedframe raised by a brick under each leg.

## 2010-06-04 NOTE — Progress Notes (Signed)
Summary: pt still sick  Phone Note Call from Patient Call back at Home Phone [phone]   Caller: Patient Call For: young Summary of Call: pt's spouse Malachi Bonds states that pt is "no better than last ov 3/18. still coughing and wheezing. please advise.  Initial call taken by: Tivis Ringer, CNA,  August 15, 2009 12:18 PM  Follow-up for Phone Call        Per pt wife, pt is wheezing, rattling in chest, no fever, chest congestion, non productive cough. Pt states no relief in cough since depo injection at last OV.  Please advise. Carron Curie CMA  August 15, 2009 3:35 PM allergies: ace inhibitors  [facility]  Additional Follow-up for Phone Call Additional follow up Details #1::        This started with an infection but pollens may be aggravating it now. Try prednisone 10 mg, # 20 1 tab four times daily x 2 days, 3 times daily x 2 days, 2 times daily x 2 days, 1 time daily x 2 days. Additional Follow-up by: Waymon Budge MD,  August 15, 2009 4:37 PM    Additional Follow-up for Phone Call Additional follow up Details #2::    called spoke with patient's wife, Malachi Bonds.  advised of CDY's recs as stated above and pending rx.  pt's wife verbalized her understanding. Follow-up by: Boone Master CNA,  August 15, 2009 4:40 PM  New/Updated Medications: PREDNISONE 10 MG TABS (PREDNISONE) 1 tab four times daily x 2 days, 3 times daily x 2 days, 2 times daily x 2 days, 1 time daily x 2 days. Prescriptions: PREDNISONE 10 MG TABS (PREDNISONE) 1 tab four times daily x 2 days, 3 times daily x 2 days, 2 times daily x 2 days, 1 time daily x 2 days.  #20 x 0   Entered by:   Boone Master CNA   Authorized by:   Waymon Budge MD   Signed by:   Boone Master CNA on 08/15/2009   Method used:   Electronically to        [id number]* (retail)       [address] [place], [place]  [address]       Ph: [phone]       Fax: [phone]   RxID:    [id number]

## 2010-06-04 NOTE — Progress Notes (Signed)
Summary: cough- req to see cy  Phone Note Call from Patient   Caller: Spouse Call For: young Summary of Call: spouse states that pt has been coughing "constantly" for 2 wks. also weak. wants to see dr young (no one else) as soon as possible. [phone] Initial call taken by: Tivis Ringer, CNA,  July 17, 2009 4:46 PM  Follow-up for Phone Call        Called and spoke with pt's spouse.  Spouse states pt hasn't been seen since Jan 2009 and therefore requests pt to be seen by CY "asap" d/t "constant coughing."  Wife states pt will cough up sputum but unsure of the color of sputum and pt wasn't at the home at the time of the phone call to ask.  spouse denied any other symptoms.  please advise.  thank you.  Aundra Millet Reynolds LPN  July 17, 2009 4:53 PM   Additional Follow-up for Phone Call Additional follow up Details #1::        Pt has appt on Friday at 11am.Katie [facility] CMA  July 19, 2009 12:20 PM

## 2010-06-06 NOTE — Assessment & Plan Note (Signed)
Summary: SOB/COUGH/WHEEZING/APC   Primary Provider/Referring Provider:  Etta Grandchild MD  CC:  Accute Visit-body aches(legs), chills, SOB, wheezing, cough, and fevers..  History of Present Illness: August 30, 2009- Asthma/ COPD Wife sends him in complaining that lying down he has to clear his throat a lot. Antibiotics didn't help.  Sitting in church may bring this out as well. Uses cough drops. He doesn't recognize reflux or postnasal drip. He thinks an inhaler might have helped long ago. Denies getting hoarse. Sleeps on 2 pilows.  December 03, 2009- Asthma/ COPD He says since last here the chest congestion/ wheeze have cleared up and he is doing well now. The rescue inhaler seemed to make  the difference and was used regularly at first, then only occasionally. He has now run out  He sleeps on 2 pillows, without raising the HOB. We had suggested this as a way to explore whether acid reflux was causing his bronchitis.  January 17, 2010 Acute visit. Malaise few days. Chills, myalgias, nausea w/o vomiting. Temp yest 102.8, today 99.3 Denies headacheor sore throat, cough, dysuria or diarrhea. No sick exposure, but visited [place] at first of September.   Preventive Screening-Counseling & Management  Alcohol-Tobacco     Alcohol drinks/day: 0     Smoking Status: quit     Year Quit: 2004     Pack years: 40 years  1 pack a day     Passive Smoke Exposure: no  Current Medications (verified): 1)  Bayer Low Strength 81 Mg  Tbec (Aspirin) .... Take 1 Tablet By Mouth Once A Day 2)  Azor 10-40 Mg Tabs (Amlodipine-Olmesartan) .... Once Daily 3)  Lipitor 10 Mg Tabs (Atorvastatin Calcium) .... Take 1 Tablet By Mouth Once A Day 4)  Centrum Silver  Tabs (Multiple Vitamins-Minerals) 5)  Vitamin E 400 Unit Caps (Vitamin E) .... Take 1 By Mouth Once Daily 6)  Folic Acid 400 Mcg Tabs (Folic Acid) .... Every Evening 7)  Proair Hfa 108 (90 Base) Mcg/act Aers (Albuterol Sulfate) .... 2 Puffs Four Times A Day  As Needed Rescue  Allergies (verified): 1)  ! Ace Inhibitors  Past History:  Past Medical History: Last updated: 07/20/2009 Asthma COPD FEV1 2.68/ 72%, FEV1/FVC 0.63 (08/22/04) Colonic polyps, hx of Hyperlipidemia Hypertension  Past Surgical History: Last updated: 07/20/2009 Prostatectomy- Robot laparoscopic- for prostate cancer 2010  Family History: Last updated: 07/20/2009 Family History of Arthritis Family History of Prostate CA 1st degree relative <50 Father- died prostate cancer, colon cancer Mother- esophageal stricture- died  Social History: Last updated: 04/05/2008 Patient states former smoker.  Married Alcohol use-no Drug use-no Regular exercise-no  Risk Factors: Alcohol Use: 0 (01/17/2010) Exercise: no (04/05/2008)  Risk Factors: Smoking Status: quit (01/17/2010) Passive Smoke Exposure: no (01/17/2010)  Review of Systems      See HPI       The patient complains of shortness of breath with activity and productive cough.  The patient denies shortness of breath at rest, coughing up blood, chest pain, irregular heartbeats, acid heartburn, indigestion, loss of appetite, weight change, abdominal pain, difficulty swallowing, tooth/dental problems, and sneezing.    Vital Signs:  Patient profile:   76 year old male Height:      72.5 inches Weight:      254.38 pounds BMI:     34.15 O2 Sat:      97 % on Room air Temp:     99.3 degrees F oral Pulse rate:   85 / minute BP sitting:  136 / 78  (left arm) Cuff size:   large  Vitals Entered By: Reynaldo Minium CMA (January 17, 2010 3:59 PM)  O2 Flow:  Room air CC: Accute Visit-body aches(legs),chills,SOB,wheezing,cough, fevers.   Physical Exam  Additional Exam:  General: A/Ox3; pleasant and cooperative, NAD, He seem sconfortable and casual today. SKIN: no rash, lesions NODES: no lymphadenopathy HEENT: [place]/AT, EOM- WNL, Conjuctivae- clear, PERRLA, TM-WNL, Nose- clear, Throat- clear and wnl, dentures,  Mallampati  II NECK: Supple w/ fair ROM, JVD-trace, normal carotid impulses w/o bruits Thyroid- normal to palpation, no hoarseness or stridor. CHEST:mild wheeze right upper back, unlabored without cough HEART: RRR, no m/g/r heard, ABDOMEN: Soft and nl;  BJY:NWGN, nl pulses, no edema  NEURO: Grossly intact to observation    .   Impression & Recommendations:  Problem # 1:  BRONCHITIS, CHRONIC (ICD-491.9)  Acute infection. I favor this being a viral pattern exacerbation of bronchitis. We will cover with Avelox against progression due to his underlying lung disease. Supportive care emphasized.   Medications Added to Medication List This Visit: 1)  Avelox 400 Mg Tabs (Moxifloxacin hcl) .Marland Kitchen.. 1 daily  Other Orders: Est. Patient Level III (56213)  Patient Instructions: 1)  Keep scheduled appointment 2)  Stay warm and rested and get plenty of fluids 3)  Script for antibiotic Avelox sent to your drug store. Prescriptions: AVELOX 400 MG TABS (MOXIFLOXACIN HCL) 1 daily  #5 x 0   Entered and Authorized by:   Waymon Budge MD   Signed by:   Waymon Budge MD on 01/17/2010   Method used:   Electronically to        [id number]* (retail)       [address] [place], [place]  [address]       Ph: [phone]       Fax: [phone]   RxID:   [id number]

## 2010-06-06 NOTE — Assessment & Plan Note (Signed)
Summary: yearly f/u // will come fasting / cd   Vital Signs:  Patient profile:   76 year old male Height:      72.5 inches Weight:      248 pounds BMI:     33.29 O2 Sat:      98 % on Room air Temp:     98.6 degrees F oral Pulse rate:   65 / minute Pulse rhythm:   regular Resp:     16 per minute BP sitting:   142 / 80  (left arm) Cuff size:   large  Vitals Entered By: Rock Nephew CMA (May 07, 2010 9:00 AM)  Nutrition Counseling: Patient's BMI is greater than 25 and therefore counseled on weight management options.  O2 Flow:  Room air CC: Patient her for Yearly exam w/ labs, Preventive Care, Hypertension Management, Lipid Management Is Patient Diabetic? No Pain Assessment Patient in pain? no       Does patient need assistance? Functional Status Self care Ambulation Normal   Primary Care Provider:  Etta Grandchild MD  CC:  Patient her for Yearly exam w/ labs, Preventive Care, Hypertension Management, and Lipid Management.  History of Present Illness: Here for Medicare AWV:  1.   Risk factors based on Past M, S, F history: yes 2.   Physical Activities: very good 3.   Depression/mood: mood is good 4.   Hearing: he hears whispered voice at 3 feet 5.   ADL's: thorough and independent 6.   Fall Risk: none noted 7.   Home Safety: very good 8.   Height, weight, &visual acuity:done 9.   Counseling: yes 10.   Labs ordered based on risk factors: yes 11.           Referral Coordination: none today 12.           Care Plan: done 13.            Cognitive Assessment : he reponds appropriately to all questions  Hypertension History:      He complains of dyspnea with exertion, but denies headache, chest pain, palpitations, orthopnea, PND, peripheral edema, visual symptoms, neurologic problems, syncope, and side effects from treatment.  He notes no problems with any antihypertensive medication side effects.        Positive major cardiovascular risk factors include male age  28 years old or older, diabetes, hyperlipidemia, and hypertension.  Negative major cardiovascular risk factors include negative family history for ischemic heart disease and non-tobacco-user status.        Further assessment for target organ damage reveals no history of ASHD, cardiac end-organ damage (CHF/LVH), stroke/TIA, peripheral vascular disease, renal insufficiency, or hypertensive retinopathy.    Lipid Management History:      Positive NCEP/ATP III risk factors include male age 59 years old or older, diabetes, and hypertension.  Negative NCEP/ATP III risk factors include no family history for ischemic heart disease, non-tobacco-user status, no ASHD (atherosclerotic heart disease), no prior stroke/TIA, no peripheral vascular disease, and no history of aortic aneurysm.        The patient states that he knows about the "Therapeutic Lifestyle Change" diet.  His compliance with the TLC diet is fair.  The patient does not know about adjunctive measures for cholesterol lowering.  Adjunctive measures started by the patient include aerobic exercise, fiber, limit alcohol consumpton, and weight reduction.  He expresses no side effects from his lipid-lowering medication.  The patient denies any symptoms to suggest myopathy or liver  disease.      Current Medications (verified): 1)  Bayer Low Strength 81 Mg  Tbec (Aspirin) .... Take 1 Tablet By Mouth Once A Day 2)  Azor 10-40 Mg Tabs (Amlodipine-Olmesartan) .... Once Daily 3)  Lipitor 10 Mg Tabs (Atorvastatin Calcium) .... Take 1 Tablet By Mouth Once A Day 4)  Centrum Silver  Tabs (Multiple Vitamins-Minerals) 5)  Vitamin E 400 Unit Caps (Vitamin E) .... Take 1 By Mouth Once Daily 6)  Folic Acid 400 Mcg Tabs (Folic Acid) .... Every Evening 7)  Proair Hfa 108 (90 Base) Mcg/act Aers (Albuterol Sulfate) .... 2 Puffs Four Times A Day As Needed Rescue  Allergies (verified): 1)  ! Ace Inhibitors  Past History:  Past Medical History: Last updated:  07/20/2009 Asthma COPD FEV1 2.68/ 72%, FEV1/FVC 0.63 (08/22/04) Colonic polyps, hx of Hyperlipidemia Hypertension  Past Surgical History: Last updated: 07/20/2009 Prostatectomy- Robot laparoscopic- for prostate cancer 2010  Family History: Last updated: 07/20/2009 Family History of Arthritis Family History of Prostate CA 1st degree relative <50 Father- died prostate cancer, colon cancer Mother- esophageal stricture- died  Social History: Last updated: 04/05/2008 Patient states former smoker.  Married Alcohol use-no Drug use-no Regular exercise-no  Risk Factors: Alcohol Use: 0 (01/17/2010) Exercise: no (04/05/2008)  Risk Factors: Smoking Status: quit (01/17/2010) Passive Smoke Exposure: no (01/17/2010)  Family History: Reviewed history from 07/20/2009 and no changes required. Family History of Arthritis Family History of Prostate CA 1st degree relative <50 Father- died prostate cancer, colon cancer Mother- esophageal stricture- died  Social History: Reviewed history from 04/05/2008 and no changes required. Patient states former smoker.  Married Alcohol use-no Drug use-no Regular exercise-no  Review of Systems       The patient complains of weight gain, dyspnea on exertion, and incontinence.  The patient denies anorexia, fever, weight loss, chest pain, syncope, peripheral edema, prolonged cough, headaches, hemoptysis, abdominal pain, melena, hematochezia, severe indigestion/heartburn, hematuria, suspicious skin lesions, transient blindness, difficulty walking, depression, enlarged lymph nodes, angioedema, and testicular masses.   GU:  Complains of incontinence; denies discharge, dysuria, genital sores, hematuria, nocturia, urinary frequency, and urinary hesitancy. Endo:  Denies cold intolerance, excessive hunger, excessive thirst, excessive urination, heat intolerance, polyuria, and weight change.  Physical Exam  General:  alert, well-developed, well-nourished,  well-hydrated, and overweight-appearing.   Head:  normocephalic and atraumatic.   Eyes:  vision grossly intact and pupils equal.   Mouth:  Oral mucosa and oropharynx without lesions or exudates.  Teeth in good repair. Neck:  supple, full ROM, no masses, no thyromegaly, no thyroid nodules or tenderness, no JVD, normal carotid upstroke, no carotid bruits, and no cervical lymphadenopathy.   Lungs:  Normal respiratory effort, chest expands symmetrically. Lungs are clear to auscultation, no crackles or wheezes. Heart:  Normal rate and regular rhythm. S1 and S2 normal without gallop, murmur, click, rub or other extra sounds. Abdomen:  soft, non-tender, normal bowel sounds, no distention, no masses, no guarding, no rigidity, no rebound tenderness, + abdominal hernia, no inguinal hernia, no hepatomegaly, no splenomegaly, and abdominal scar(s).   Rectal:  no external abnormalities, no hemorrhoids, normal sphincter tone, no masses, no tenderness, no fissures, no fistulae, and no perianal rash.   Genitalia:  circumcised, no hydrocele, no varicocele, no scrotal masses, no cutaneous lesions, no urethral discharge, R testes atrophic, and L testes atrophic.   Prostate:  no gland enlargement, no nodules, no asymmetry, and no induration.   Msk:  normal ROM, no joint tenderness, no joint swelling, no  joint warmth, no redness over joints, and no joint deformities.   Pulses:  R and L carotid,radial,femoral,dorsalis pedis and posterior tibial pulses are full and equal bilaterally Extremities:  trace left pedal edema and trace right pedal edema.   Neurologic:  No cranial nerve deficits noted. Station and gait are normal. Plantar reflexes are down-going bilaterally. DTRs are symmetrical throughout. Sensory, motor and coordinative functions appear intact. Skin:  turgor normal, color normal, no rashes, no suspicious lesions, no ecchymoses, no petechiae, no purpura, no ulcerations, and no edema.   Cervical Nodes:  no anterior  cervical adenopathy and no posterior cervical adenopathy.   Axillary Nodes:  no R axillary adenopathy and no L axillary adenopathy.   Inguinal Nodes:  no R inguinal adenopathy and no L inguinal adenopathy.   Psych:  Cognition and judgment appear intact. Alert and cooperative with normal attention span and concentration. No apparent delusions, illusions, hallucinations  Diabetes Management Exam:    Foot Exam (with socks and/or shoes not present):       Sensory-Pinprick/Light touch:          Left medial foot (L-4): normal          Left dorsal foot (L-5): normal          Left lateral foot (S-1): normal          Right medial foot (L-4): normal          Right dorsal foot (L-5): normal          Right lateral foot (S-1): normal       Sensory-Monofilament:          Left foot: normal          Right foot: normal       Inspection:          Left foot: normal          Right foot: normal       Nails:          Left foot: normal          Right foot: normal   Impression & Recommendations:  Problem # 1:  ROUTINE GENERAL MEDICAL EXAM@HEALTH  CARE FACL (ICD-V70.0) Assessment New  Colonoscopy: Location:  [facility].   (10/26/2008) Td Booster: Tdap (09/26/2008)   Chol: 163 (05/02/2009)   HDL: 42.60 (05/02/2009)   LDL: 92 (05/02/2009)   TG: 141.0 (05/02/2009) TSH: 1.45 (05/02/2009)   HgbA1C: 6.5 (05/02/2009)   PSA: 4.14 (04/27/2008) Next Colonoscopy due:: 11/2013 (10/26/2008)  Discussed using sunscreen, use of alcohol, drug use, self testicular exam, routine dental care, routine eye care, routine physical exam, seat belts, multiple vitamins, osteoporosis prevention, adequate calcium intake in diet, and recommendations for immunizations.  Discussed exercise and checking cholesterol.  Also recommend checking PSA.  Orders: [facility] -Subsequent Annual Wellness Visit [phone]) DRE (O9629) Prostate / PSA (Medicare) (G0103)  Problem # 2:  DIABETES-TYPE 2 (ICD-250.00) Assessment: Unchanged  His  updated medication list for this problem includes:    Bayer Low Strength 81 Mg Tbec (Aspirin) .Marland Kitchen... Take 1 tablet by mouth once a day    Azor 10-40 Mg Tabs (Amlodipine-olmesartan) ..... Once daily  Orders: Venipuncture ([id number]) TLB-Lipid Panel (80061-LIPID) TLB-BMP (Basic Metabolic Panel-BMET) (80048-METABOL) TLB-CBC Platelet - w/Differential (85025-CBCD) TLB-Hepatic/Liver Function Pnl (80076-HEPATIC) TLB-TSH (Thyroid Stimulating Hormone) (84443-TSH) TLB-A1C / Hgb A1C (Glycohemoglobin) (83036-A1C) TLB-PSA (Prostate Specific Antigen) (84153-PSA) TLB-Microalbumin/Creat Ratio, Urine (82043-MALB) TLB-Udip w/ Micro (81001-URINE) Ophthalmology Referral (Ophthalmology)  Labs Reviewed: Creat: 1.1 (05/02/2009)     Last Eye  Exam: normal (04/04/2009) Reviewed HgBA1c results: 6.5 (05/02/2009)  6.7 (04/27/2008)  Problem # 3:  HYPERTENSION (ICD-401.9) Assessment: Unchanged  His updated medication list for this problem includes:    Azor 10-40 Mg Tabs (Amlodipine-olmesartan) ..... Once daily  Orders: Venipuncture ([id number]) TLB-Lipid Panel (80061-LIPID) TLB-BMP (Basic Metabolic Panel-BMET) (80048-METABOL) TLB-CBC Platelet - w/Differential (85025-CBCD) TLB-Hepatic/Liver Function Pnl (80076-HEPATIC) TLB-TSH (Thyroid Stimulating Hormone) (84443-TSH) TLB-A1C / Hgb A1C (Glycohemoglobin) (83036-A1C) TLB-PSA (Prostate Specific Antigen) (84153-PSA) TLB-Microalbumin/Creat Ratio, Urine (82043-MALB) TLB-Udip w/ Micro (81001-URINE)  BP today: 142/80 Prior BP: 136/78 (01/17/2010)  Prior 10 Yr Risk Heart Disease: 27 % (06/04/2009)  Labs Reviewed: K+: 4.0 (05/02/2009) Creat: : 1.1 (05/02/2009)   Chol: 163 (05/02/2009)   HDL: 42.60 (05/02/2009)   LDL: 92 (05/02/2009)   TG: 141.0 (05/02/2009)  Problem # 4:  NEOPLASM, MALIGNANT, PROSTATE, HX OF, S/P TURP (ICD-V10.46) Assessment: Unchanged  Orders: Venipuncture ([id number]) TLB-Lipid Panel (80061-LIPID) TLB-BMP (Basic Metabolic Panel-BMET)  (80048-METABOL) TLB-CBC Platelet - w/Differential (85025-CBCD) TLB-Hepatic/Liver Function Pnl (80076-HEPATIC) TLB-TSH (Thyroid Stimulating Hormone) (84443-TSH) TLB-A1C / Hgb A1C (Glycohemoglobin) (83036-A1C) TLB-PSA (Prostate Specific Antigen) (84153-PSA) TLB-Microalbumin/Creat Ratio, Urine (82043-MALB) TLB-Udip w/ Micro (81001-URINE) DRE (H4742) Prostate / PSA (Medicare) (V9563)  Problem # 5:  HYPERLIPIDEMIA (ICD-272.4) Assessment: Unchanged  His updated medication list for this problem includes:    Lipitor 10 Mg Tabs (Atorvastatin calcium) .Marland Kitchen... Take 1 tablet by mouth once a day  Orders: Venipuncture ([id number]) TLB-Lipid Panel (80061-LIPID) TLB-BMP (Basic Metabolic Panel-BMET) (80048-METABOL) TLB-CBC Platelet - w/Differential (85025-CBCD) TLB-Hepatic/Liver Function Pnl (80076-HEPATIC) TLB-TSH (Thyroid Stimulating Hormone) (84443-TSH) TLB-A1C / Hgb A1C (Glycohemoglobin) (83036-A1C) TLB-PSA (Prostate Specific Antigen) (84153-PSA) TLB-Microalbumin/Creat Ratio, Urine (82043-MALB) TLB-Udip w/ Micro (81001-URINE)  Labs Reviewed: SGOT: 25 (05/02/2009)   SGPT: 19 (05/02/2009)  Lipid Goals: Chol Goal: 200 (04/05/2008)   HDL Goal: 40 (04/05/2008)   LDL Goal: 100 (04/05/2008)   TG Goal: 150 (04/05/2008)  Prior 10 Yr Risk Heart Disease: 27 % (06/04/2009)   HDL:42.60 (05/02/2009), 38.4 (04/27/2008)  LDL:92 (05/02/2009), 102 (33/29/5188)  Chol:163 (05/02/2009), 164 (04/27/2008)  Trig:141.0 (05/02/2009), 116 (04/27/2008)  Problem # 6:  ELECTROCARDIOGRAM, ABNORMAL (ICD-794.31) Assessment: Unchanged His DOE is caused by his lung disease Orders: EKG w/ Interpretation (93000)  Complete Medication List: 1)  Bayer Low Strength 81 Mg Tbec (Aspirin) .... Take 1 tablet by mouth once a day 2)  Azor 10-40 Mg Tabs (Amlodipine-olmesartan) .... Once daily 3)  Lipitor 10 Mg Tabs (Atorvastatin calcium) .... Take 1 tablet by mouth once a day 4)  Centrum Silver Tabs (Multiple vitamins-minerals) 5)   Vitamin E 400 Unit Caps (Vitamin e) .... Take 1 by mouth once daily 6)  Folic Acid 400 Mcg Tabs (Folic acid) .... Every evening 7)  Proair Hfa 108 (90 Base) Mcg/act Aers (Albuterol sulfate) .... 2 puffs four times a day as needed rescue  Other Orders: Hemoccult Guaiac-1 spec.(in office) (82270)  Hypertension Assessment/Plan:      The patient's hypertensive risk group is category C: Target organ damage and/or diabetes.  His calculated 10 year risk of coronary heart disease is 27 %.  Today's blood pressure is 142/80.  His blood pressure goal is < 130/80.  Lipid Assessment/Plan:      Based on NCEP/ATP III, the patient's risk factor category is "history of diabetes".  The patient's lipid goals are as follows: Total cholesterol goal is 200; LDL cholesterol goal is 100; HDL cholesterol goal is 40; Triglyceride goal is 150.    Colorectal Screening:  Current Recommendations:    Hemoccult: NEG X 1 today  PSA  Screening:    PSA: 4.14  (04/27/2008)    Reviewed PSA screening recommendations: PSA ordered  Immunization & Chemoprophylaxis:    Tetanus vaccine: Tdap  (09/26/2008)  Patient Instructions: 1)  Please schedule a follow-up appointment in 4 months. 2)  It is important that you exercise regularly at least 20 minutes 5 times a week. If you develop chest pain, have severe difficulty breathing, or feel very tired , stop exercising immediately and seek medical attention. 3)  You need to lose weight. Consider a lower calorie diet and regular exercise.  4)  Take an Aspirin every day. 5)  Check your blood sugars regularly. If your readings are usually above 200 or below 70 you should contact our office. 6)  It is important that your Diabetic A1c level is checked every 3 months. 7)  See your eye doctor yearly to check for diabetic eye damage. 8)  Check your feet each night for sore areas, calluses or signs of infection. 9)  Check your Blood Pressure regularly. If it is above 130/80: you should make  an appointment.   Orders Added: 1)  Venipuncture [36415] 2)  TLB-Lipid Panel [80061-LIPID] 3)  TLB-BMP (Basic Metabolic Panel-BMET) [80048-METABOL] 4)  TLB-CBC Platelet - w/Differential [85025-CBCD] 5)  TLB-Hepatic/Liver Function Pnl [80076-HEPATIC] 6)  TLB-TSH (Thyroid Stimulating Hormone) [84443-TSH] 7)  TLB-A1C / Hgb A1C (Glycohemoglobin) [83036-A1C] 8)  TLB-PSA (Prostate Specific Antigen) [84153-PSA] 9)  TLB-Microalbumin/Creat Ratio, Urine [82043-MALB] 10)  TLB-Udip w/ Micro [81001-URINE] 11)  Ophthalmology Referral [Ophthalmology] 12)  [facility] -Subsequent Annual Wellness Visit [G0439] 13)  DRE [G0102] 14)  Prostate / PSA (Medicare) [G0103] 15)  Hemoccult Guaiac-1 spec.(in office) [82270] 16)  Est. Patient Level III [16109] 17)  EKG w/ Interpretation [93000]  Appended Document: yearly f/u // will come fasting / cd    Clinical Lists Changes  Observations: Added new observation of PNEUMOVAX: Historical (05/05/2006 9:46)       Immunization History:  Pneumovax Immunization History:    Pneumovax:  historical (05/05/2006)  Not Administered:    Influenza Vaccine not given due to: declined

## 2010-06-06 NOTE — Letter (Signed)
Summary: Lipid Letter  [facility]  [address] [place], [place] [address]   Phone: [phone]  Fax: [phone]    05/08/2010  [name] [address] [place], [place]  [address]  Dear Billey Gosling:  We have carefully reviewed your last lipid profile from 05/07/2010 and the results are noted below with a summary of recommendations for lipid management.    Cholesterol:       166     Goal: <200   HDL "good" Cholesterol:   40.34     Goal: >40   LDL "bad" Cholesterol:   107     Goal: <100   Triglycerides:       102.0     Goal: <150    your other labs look good    TLC Diet (Therapeutic Lifestyle Change): Saturated Fats & Transfatty acids should be kept < 7% of total calories ***Reduce Saturated Fats Polyunstaurated Fat can be up to 10% of total calories Monounsaturated Fat Fat can be up to 20% of total calories Total Fat should be no greater than 25-35% of total calories Carbohydrates should be 50-60% of total calories Protein should be approximately 15% of total calories Fiber should be at least 20-30 grams a day ***Increased fiber may help lower LDL Total Cholesterol should be < 200mg /day Consider adding plant stanol/sterols to diet (example: Benacol spread) ***A higher intake of unsaturated fat may reduce Triglycerides and Increase HDL    Adjunctive Measures (may lower LIPIDS and reduce risk of Heart Attack) include: Aerobic Exercise (20-30 minutes 3-4 times a week) Limit Alcohol Consumption Weight Reduction Aspirin 75-81 mg a day by mouth (if not allergic or contraindicated) Dietary Fiber 20-30 grams a day by mouth     Current Medications: 1)    Bayer Low Strength 81 Mg  Tbec (Aspirin) .... Take 1 tablet by mouth once a day 2)    Azor 10-40 Mg Tabs (Amlodipine-olmesartan) .... Once daily 3)    Lipitor 10 Mg Tabs (Atorvastatin calcium) .... Take 1 tablet by mouth once a day 4)    Centrum Silver  Tabs (Multiple vitamins-minerals) 5)    Vitamin E 400  Unit Caps (Vitamin e) .... Take 1 by mouth once daily 6)    Folic Acid 400 Mcg Tabs (Folic acid) .... Every evening 7)    Proair Hfa 108 (90 Base) Mcg/act Aers (Albuterol sulfate) .... 2 puffs four times a day as needed rescue  If you have any questions, please call. We appreciate being able to work with you.   Sincerely,    [facility] Etta Grandchild MD

## 2010-06-19 ENCOUNTER — Ambulatory Visit (INDEPENDENT_AMBULATORY_CARE_PROVIDER_SITE_OTHER)
Admission: RE | Admit: 2010-06-19 | Discharge: 2010-06-19 | Disposition: A | Discharge status: Home / Self Care | Payer: Medicare Other | Source: Ambulatory Visit | Attending: Internal Medicine | Admitting: Internal Medicine

## 2010-06-19 ENCOUNTER — Ambulatory Visit (INDEPENDENT_AMBULATORY_CARE_PROVIDER_SITE_OTHER): Payer: Medicare Other | Admitting: Internal Medicine

## 2010-06-19 ENCOUNTER — Other Ambulatory Visit: Payer: Self-pay | Admitting: Internal Medicine

## 2010-06-19 ENCOUNTER — Encounter: Payer: Self-pay | Admitting: Internal Medicine

## 2010-06-19 DIAGNOSIS — J449 Chronic obstructive pulmonary disease, unspecified: Secondary | ICD-10-CM

## 2010-06-19 DIAGNOSIS — J209 Acute bronchitis, unspecified: Secondary | ICD-10-CM

## 2010-06-19 DIAGNOSIS — I10 Essential (primary) hypertension: Secondary | ICD-10-CM

## 2010-06-19 DIAGNOSIS — R05 Cough: Secondary | ICD-10-CM

## 2010-06-19 DIAGNOSIS — E119 Type 2 diabetes mellitus without complications: Secondary | ICD-10-CM

## 2010-06-19 DIAGNOSIS — R059 Cough, unspecified: Secondary | ICD-10-CM

## 2010-06-26 NOTE — Assessment & Plan Note (Signed)
Summary: COUGH / NWS #   Vital Signs:  Patient profile:   76 year old male Height:      72.5 inches (184.15 cm) Weight:      248.13 pounds (112.79 kg) BMI:     33.31 O2 Sat:      95 % on Room air Temp:     98.7 degrees F (37.06 degrees C) oral Pulse rate:   81 / minute Pulse rhythm:   regular Resp:     16 per minute BP sitting:   138 / 82  (left arm) Cuff size:   large  Vitals Entered By: Brenton Grills CMA Duncan Dull) (June 19, 2010 8:17 AM)  Nutrition Counseling: Patient's BMI is greater than 25 and therefore counseled on weight management options.  O2 Flow:  Room air CC: Productive cough with yellow mucus, worse at night since January/aj, URI symptoms Is Patient Diabetic? Yes Pain Assessment Patient in pain? no        Primary Care Provider:  Etta Grandchild MD  CC:  Productive cough with yellow mucus, worse at night since January/aj, and URI symptoms.  History of Present Illness:  URI Symptoms      This is a 76 year old man who presents with URI symptoms.  The symptoms began 4 weeks ago.  The severity is described as mild.  The patient reports nasal congestion and productive cough, but denies clear nasal discharge, purulent nasal discharge, sore throat, dry cough, earache, and sick contacts.  Associated symptoms include dyspnea and wheezing.  The patient denies fever, stiff neck, rash, vomiting, diarrhea, use of an antipyretic, and response to antipyretic.  The patient denies itchy throat, sneezing, seasonal symptoms, headache, muscle aches, and severe fatigue.  The patient denies the following risk factors for Strep sinusitis: unilateral facial pain, unilateral nasal discharge, poor response to decongestant, double sickening, tooth pain, Strep exposure, tender adenopathy, and absence of cough.    Current Medications (verified): 1)  Bayer Low Strength 81 Mg  Tbec (Aspirin) .... Take 1 Tablet By Mouth Once A Day 2)  Azor 10-40 Mg Tabs (Amlodipine-Olmesartan) .... Once  Daily 3)  Lipitor 10 Mg Tabs (Atorvastatin Calcium) .... Take 1 Tablet By Mouth Once A Day 4)  Centrum Silver  Tabs (Multiple Vitamins-Minerals) 5)  Vitamin E 400 Unit Caps (Vitamin E) .... Take 1 By Mouth Once Daily 6)  Folic Acid 400 Mcg Tabs (Folic Acid) .... Every Evening 7)  Proair Hfa 108 (90 Base) Mcg/act Aers (Albuterol Sulfate) .... 2 Puffs Four Times A Day As Needed Rescue  Allergies (verified): 1)  ! Ace Inhibitors  Past History:  Past Medical History: Last updated: 07/20/2009 Asthma COPD FEV1 2.68/ 72%, FEV1/FVC 0.63 (08/22/04) Colonic polyps, hx of Hyperlipidemia Hypertension  Past Surgical History: Last updated: 07/20/2009 Prostatectomy- Robot laparoscopic- for prostate cancer 2010  Family History: Last updated: 07/20/2009 Family History of Arthritis Family History of Prostate CA 1st degree relative <50 Father- died prostate cancer, colon cancer Mother- esophageal stricture- died  Social History: Last updated: 04/05/2008 Patient states former smoker.  Married Alcohol use-no Drug use-no Regular exercise-no  Risk Factors: Alcohol Use: 0 (01/17/2010) Exercise: no (04/05/2008)  Risk Factors: Smoking Status: quit (01/17/2010) Passive Smoke Exposure: no (01/17/2010)  Family History: Reviewed history from 07/20/2009 and no changes required. Family History of Arthritis Family History of Prostate CA 1st degree relative <50 Father- died prostate cancer, colon cancer Mother- esophageal stricture- died  Social History: Reviewed history from 04/05/2008 and no changes required. Patient  states former smoker.  Married Alcohol use-no Drug use-no Regular exercise-no  Review of Systems       The patient complains of prolonged cough.  The patient denies anorexia, fever, weight loss, weight gain, hoarseness, chest pain, syncope, dyspnea on exertion, peripheral edema, headaches, hemoptysis, abdominal pain, hematuria, suspicious skin lesions, difficulty walking,  depression, enlarged lymph nodes, and angioedema.   Resp:  Complains of cough, shortness of breath, sputum productive, and wheezing; denies chest discomfort, chest pain with inspiration, coughing up blood, excessive snoring, hypersomnolence, morning headaches, and pleuritic. Endo:  Denies cold intolerance, excessive hunger, excessive thirst, excessive urination, heat intolerance, polyuria, and weight change.  Physical Exam  General:  alert, well-developed, well-nourished, well-hydrated, and overweight-appearing.   Head:  normocephalic and atraumatic.   Eyes:  vision grossly intact and pupils equal.   Ears:  R ear normal and L ear normal.   Nose:  External nasal examination shows no deformity or inflammation. Nasal mucosa are pink and moist without lesions or exudates. Mouth:  Oral mucosa and oropharynx without lesions or exudates.  Teeth in good repair. Neck:  supple, full ROM, no masses, no thyromegaly, no thyroid nodules or tenderness, no JVD, normal carotid upstroke, no carotid bruits, and no cervical lymphadenopathy.   Lungs:  He has bilateral and symmetrical inspiratory rhonchi and mild wheezing but good air movement and no rales or decreased breath sounds. normal respiratory effort, no intercostal retractions, no accessory muscle use, and no crackles.   Heart:  Normal rate and regular rhythm. S1 and S2 normal without gallop, murmur, click, rub or other extra sounds. Abdomen:  soft, non-tender, normal bowel sounds, no distention, no masses, no guarding, no rigidity, no rebound tenderness, + abdominal hernia, no inguinal hernia, no hepatomegaly, no splenomegaly, and abdominal scar(s).   Msk:  normal ROM, no joint tenderness, no joint swelling, no joint warmth, no redness over joints, and no joint deformities.   Pulses:  R and L carotid,radial,femoral,dorsalis pedis and posterior tibial pulses are full and equal bilaterally Extremities:  trace left pedal edema and trace right pedal edema.     Neurologic:  No cranial nerve deficits noted. Station and gait are normal. Plantar reflexes are down-going bilaterally. DTRs are symmetrical throughout. Sensory, motor and coordinative functions appear intact. Skin:  turgor normal, color normal, no rashes, no suspicious lesions, no ecchymoses, no petechiae, no purpura, no ulcerations, and no edema.   Cervical Nodes:  no anterior cervical adenopathy and no posterior cervical adenopathy.   Axillary Nodes:  no R axillary adenopathy and no L axillary adenopathy.   Inguinal Nodes:  no R inguinal adenopathy and no L inguinal adenopathy.   Psych:  Cognition and judgment appear intact. Alert and cooperative with normal attention span and concentration. No apparent delusions, illusions, hallucinations  Diabetes Management Exam:    Foot Exam (with socks and/or shoes not present):       Sensory-Pinprick/Light touch:          Left medial foot (L-4): normal          Left dorsal foot (L-5): normal          Left lateral foot (S-1): normal          Right medial foot (L-4): normal          Right dorsal foot (L-5): normal          Right lateral foot (S-1): normal       Sensory-Monofilament:          Left  foot: normal          Right foot: normal       Inspection:          Left foot: normal          Right foot: normal       Nails:          Left foot: normal          Right foot: normal   Impression & Recommendations:  Problem # 1:  COUGH (ICD-786.2) Assessment New  Orders: T-2 View CXR (71020TC)  Problem # 2:  BRONCHITIS-ACUTE (ICD-466.0) Assessment: New  His updated medication list for this problem includes:    Proair Hfa 108 (90 Base) Mcg/act Aers (Albuterol sulfate) .Marland Kitchen... 2 puffs four times a day as needed rescue    Ceftin 500 Mg Tab (Cefuroxime axetil) .Marland Kitchen... Take one (1) tablet by mouth two (2) times a day x 10 days    Dulera 100-5 Mcg/act Aero (Mometasone furo-formoterol fum) .Marland Kitchen... 2 puffs two times a day  Problem # 3:  COPD  (ICD-496) Assessment: Deteriorated  His updated medication list for this problem includes:    Proair Hfa 108 (90 Base) Mcg/act Aers (Albuterol sulfate) .Marland Kitchen... 2 puffs four times a day as needed rescue    Dulera 100-5 Mcg/act Aero (Mometasone furo-formoterol fum) .Marland Kitchen... 2 puffs two times a day  Problem # 4:  HYPERTENSION (ICD-401.9) Assessment: Improved  His updated medication list for this problem includes:    Azor 10-40 Mg Tabs (Amlodipine-olmesartan) ..... Once daily  BP today: 138/82 Prior BP: 142/80 (05/07/2010)  Prior 10 Yr Risk Heart Disease: 27 % (06/04/2009)  Labs Reviewed: K+: 4.2 (05/07/2010) Creat: : 1.0 (05/07/2010)   Chol: 166 (05/07/2010)   HDL: 38.30 (05/07/2010)   LDL: 107 (05/07/2010)   TG: 102.0 (05/07/2010)  Complete Medication List: 1)  Bayer Low Strength 81 Mg Tbec (Aspirin) .... Take 1 tablet by mouth once a day 2)  Azor 10-40 Mg Tabs (Amlodipine-olmesartan) .... Once daily 3)  Lipitor 10 Mg Tabs (Atorvastatin calcium) .... Take 1 tablet by mouth once a day 4)  Centrum Silver Tabs (Multiple vitamins-minerals) 5)  Vitamin E 400 Unit Caps (Vitamin e) .... Take 1 by mouth once daily 6)  Folic Acid 400 Mcg Tabs (Folic acid) .... Every evening 7)  Proair Hfa 108 (90 Base) Mcg/act Aers (Albuterol sulfate) .... 2 puffs four times a day as needed rescue 8)  Ceftin 500 Mg Tab (Cefuroxime axetil) .... Take one (1) tablet by mouth two (2) times a day x 10 days 9)  Dulera 100-5 Mcg/act Aero (Mometasone furo-formoterol fum) .... 2 puffs two times a day  Patient Instructions: 1)  Please schedule a follow-up appointment in 2 weeks. 2)  It is important that you exercise regularly at least 20 minutes 5 times a week. If you develop chest pain, have severe difficulty breathing, or feel very tired , stop exercising immediately and seek medical attention. 3)  You need to lose weight. Consider a lower calorie diet and regular exercise.  4)  Check your blood sugars regularly. If  your readings are usually above 200 or below 70 you should contact our office. 5)  It is important that your Diabetic A1c level is checked every 3 months. 6)  See your eye doctor yearly to check for diabetic eye damage. 7)  Check your feet each night for sore areas, calluses or signs of infection. 8)  Check your Blood Pressure regularly. If it is above  130/80: you should make an appointment. 9)  Take your antibiotic as prescribed until ALL of it is gone, but stop if you develop a rash or swelling and contact our office as soon as possible. 10)  Acute bronchitis symptoms for less than 10 days are not helped by antibiotics. take over the counter cough medications. call if no improvment in  5-7 days, sooner if increasing cough, fever, or new symptoms( shortness of breath, chest pain). Prescriptions: DULERA 100-5 MCG/ACT AERO (MOMETASONE FURO-FORMOTEROL FUM) 2 puffs two times a day  #10 inhs x 0   Entered and Authorized by:   Etta Grandchild MD   Signed by:   Etta Grandchild MD on 06/19/2010   Method used:   Samples Given   RxID:   [id number] CEFTIN 500 MG TAB (CEFUROXIME AXETIL) Take one (1) tablet by mouth two (2) times a day X 10 days  #20 x 1   Entered and Authorized by:   Etta Grandchild MD   Signed by:   Etta Grandchild MD on 06/19/2010   Method used:   Electronically to        [facility] [address] #[id number]* (retail)       [address] [place], [place]  [address]       Ph: [phone]       Fax: [phone]   RxID:   [id number]    Orders Added: 1)  T-2 View CXR [71020TC] 2)  Est. Patient Level IV [20254]

## 2010-07-18 ENCOUNTER — Encounter: Payer: Self-pay | Admitting: Internal Medicine

## 2010-07-18 ENCOUNTER — Telehealth: Payer: Self-pay | Admitting: Internal Medicine

## 2010-07-18 ENCOUNTER — Ambulatory Visit (INDEPENDENT_AMBULATORY_CARE_PROVIDER_SITE_OTHER): Payer: Medicare Other | Admitting: Internal Medicine

## 2010-07-18 DIAGNOSIS — J449 Chronic obstructive pulmonary disease, unspecified: Secondary | ICD-10-CM

## 2010-07-18 DIAGNOSIS — I10 Essential (primary) hypertension: Secondary | ICD-10-CM

## 2010-07-18 DIAGNOSIS — E119 Type 2 diabetes mellitus without complications: Secondary | ICD-10-CM

## 2010-07-18 DIAGNOSIS — E785 Hyperlipidemia, unspecified: Secondary | ICD-10-CM

## 2010-07-19 ENCOUNTER — Ambulatory Visit: Payer: Self-pay | Admitting: Internal Medicine

## 2010-07-23 NOTE — Progress Notes (Signed)
Summary: Rx refill  Phone Note Call from Patient Call back at Home Phone [phone]   Caller: Patient Summary of Call: Pt request Rx refill for Lipitor Initial call taken by: Burnard Leigh [facility]),  July 18, 2010 3:56 PM  Follow-up for Phone Call        [facility] for Pt to callback w/pharmacy information Follow-up by: Burnard Leigh [facility]),  July 18, 2010 3:57 PM    New/Updated Medications: LIPITOR 80 MG TABS (ATORVASTATIN CALCIUM) One by mouth once daily Prescriptions: LIPITOR 80 MG TABS (ATORVASTATIN CALCIUM) One by mouth once daily  #90 x 3   Entered and Authorized by:   Etta Grandchild MD   Signed by:   Etta Grandchild MD on 07/19/2010   Method used:   Electronically to        [facility] [address] [id number]* (retail)       [address] [place], [place]  [address]       Ph: [phone]       Fax: [phone]   RxID:   [id number]

## 2010-07-23 NOTE — Assessment & Plan Note (Signed)
Summary: 1 MO FU/NWS  #   Vital Signs:  Patient profile:   76 year old male Height:      72.5 inches Weight:      248 pounds BMI:     33.29 O2 Sat:      96 % on Room air Temp:     98.3 degrees F oral Pulse rate:   60 / minute Pulse rhythm:   regular Resp:     16 per minute BP sitting:   140 / 78  (left arm) Cuff size:   large  Vitals Entered By: Rock Nephew CMA (July 18, 2010 8:11 AM)  Nutrition Counseling: Patient's BMI is greater than 25 and therefore counseled on weight management options.  O2 Flow:  Room air CC: follow-up visit Is Patient Diabetic? No Pain Assessment Patient in pain? no       Does patient need assistance? Functional Status Self care Ambulation Normal   Primary Care Provider:  Etta Grandchild MD  CC:  follow-up visit.  History of Present Illness:  Follow-Up Visit      This is a 76 year old man who presents for Follow-up visit.  The patient denies chest pain, palpitations, dizziness, syncope, low blood sugar symptoms, high blood sugar symptoms, edema, SOB, DOE, PND, and orthopnea.  Since the last visit the patient notes no new problems or concerns.  The patient reports taking meds as prescribed, not monitoring BP, not monitoring blood sugars, and dietary compliance.  When questioned about possible medication side effects, the patient notes none.    Preventive Screening-Counseling & Management  Alcohol-Tobacco     Alcohol drinks/day: 0     Alcohol Counseling: not indicated; patient does not drink     Smoking Status: quit     Year Quit: 2004     Pack years: 40 years  1 pack a day     Passive Smoke Exposure: no     Tobacco Counseling: to remain off tobacco products  Hep-HIV-STD-Contraception     Hepatitis Risk: no risk noted     HIV Risk: no     STD Risk: no risk noted      Drug Use:  no.    Clinical Review Panels:  Prevention   Last Colonoscopy:  Location:  [facility].  (10/26/2008)   Last PSA:  0.00  (05/07/2010)  Immunizations   Last Tetanus Booster:  Tdap (09/26/2008)   Last Pneumovax:  Historical (05/05/2006)  Lipid Management   Cholesterol:  166 (05/07/2010)   LDL (bad choesterol):  107 (05/07/2010)   HDL (good cholesterol):  38.30 (05/07/2010)  Diabetes Management   HgBA1C:  6.8 (05/07/2010)   Creatinine:  1.0 (05/07/2010)   Last Dilated Eye Exam:  normal (04/04/2009)   Last Foot Exam:  yes (07/18/2010)   Last Pneumovax:  Historical (05/05/2006)  CBC   WBC:  4.0 (05/07/2010)   RBC:  4.65 (05/07/2010)   Hgb:  13.0 (05/07/2010)   Hct:  38.6 (05/07/2010)   Platelets:  206.0 (05/07/2010)   MCV  82.9 (05/07/2010)   MCHC  33.7 (05/07/2010)   RDW  15.5 (05/07/2010)   PMN:  52.9 (05/07/2010)   Lymphs:  34.2 (05/07/2010)   Monos:  10.2 (05/07/2010)   Eosinophils:  2.3 (05/07/2010)   Basophil:  0.4 (05/07/2010)  Complete Metabolic Panel   Glucose:  103 (05/07/2010)   Sodium:  140 (05/07/2010)   Potassium:  4.2 (05/07/2010)   Chloride:  103 (05/07/2010)   CO2:  29 (05/07/2010)  BUN:  15 (05/07/2010)   Creatinine:  1.0 (05/07/2010)   Albumin:  3.6 (05/07/2010)   Total Protein:  6.9 (05/07/2010)   Calcium:  8.9 (05/07/2010)   Total Bili:  0.6 (05/07/2010)   Alk Phos:  96 (05/07/2010)   SGPT (ALT):  21 (05/07/2010)   SGOT (AST):  26 (05/07/2010)   Medications Prior to Update: 1)  Bayer Low Strength 81 Mg  Tbec (Aspirin) .... Take 1 Tablet By Mouth Once A Day 2)  Azor 10-40 Mg Tabs (Amlodipine-Olmesartan) .... Once Daily 3)  Lipitor 10 Mg Tabs (Atorvastatin Calcium) .... Take 1 Tablet By Mouth Once A Day 4)  Centrum Silver  Tabs (Multiple Vitamins-Minerals) 5)  Vitamin E 400 Unit Caps (Vitamin E) .... Take 1 By Mouth Once Daily 6)  Folic Acid 400 Mcg Tabs (Folic Acid) .... Every Evening 7)  Proair Hfa 108 (90 Base) Mcg/act Aers (Albuterol Sulfate) .... 2 Puffs Four Times A Day As Needed Rescue 8)  Dulera 100-5 Mcg/act Aero (Mometasone Furo-Formoterol Fum) .... 2  Puffs Two Times A Day  Current Medications (verified): 1)  Bayer Low Strength 81 Mg  Tbec (Aspirin) .... Take 1 Tablet By Mouth Once A Day 2)  Azor 10-40 Mg Tabs (Amlodipine-Olmesartan) .... Once Daily 3)  Lipitor 10 Mg Tabs (Atorvastatin Calcium) .... Take 1 Tablet By Mouth Once A Day 4)  Centrum Silver  Tabs (Multiple Vitamins-Minerals) 5)  Vitamin E 400 Unit Caps (Vitamin E) .... Take 1 By Mouth Once Daily 6)  Folic Acid 400 Mcg Tabs (Folic Acid) .... Every Evening 7)  Proair Hfa 108 (90 Base) Mcg/act Aers (Albuterol Sulfate) .... 2 Puffs Four Times A Day As Needed Rescue 8)  Dulera 100-5 Mcg/act Aero (Mometasone Furo-Formoterol Fum) .... 2 Puffs Two Times A Day  Allergies (verified): 1)  ! Ace Inhibitors  Past History:  Past Medical History: Last updated: 07/20/2009 Asthma COPD FEV1 2.68/ 72%, FEV1/FVC 0.63 (08/22/04) Colonic polyps, hx of Hyperlipidemia Hypertension  Past Surgical History: Last updated: 07/20/2009 Prostatectomy- Robot laparoscopic- for prostate cancer 2010  Family History: Last updated: 07/20/2009 Family History of Arthritis Family History of Prostate CA 1st degree relative <50 Father- died prostate cancer, colon cancer Mother- esophageal stricture- died  Social History: Last updated: 04/05/2008 Patient states former smoker.  Married Alcohol use-no Drug use-no Regular exercise-no  Risk Factors: Alcohol Use: 0 (07/18/2010) Exercise: no (04/05/2008)  Risk Factors: Smoking Status: quit (07/18/2010) Passive Smoke Exposure: no (07/18/2010)  Family History: Reviewed history from 07/20/2009 and no changes required. Family History of Arthritis Family History of Prostate CA 1st degree relative <50 Father- died prostate cancer, colon cancer Mother- esophageal stricture- died  Social History: Reviewed history from 04/05/2008 and no changes required. Patient states former smoker.  Married Alcohol use-no Drug use-no Regular  exercise-no  Review of Systems  The patient denies anorexia, fever, weight loss, weight gain, chest pain, syncope, dyspnea on exertion, peripheral edema, prolonged cough, headaches, hemoptysis, abdominal pain, muscle weakness, suspicious skin lesions, depression, unusual weight change, abnormal bleeding, enlarged lymph nodes, and angioedema.   Resp:  Denies chest discomfort, cough, coughing up blood, excessive snoring, hypersomnolence, morning headaches, pleuritic, shortness of breath, sputum productive, and wheezing. Endo:  Denies cold intolerance, excessive hunger, excessive thirst, excessive urination, heat intolerance, polyuria, and weight change.  Physical Exam  General:  alert, well-developed, well-nourished, well-hydrated, and overweight-appearing.   Head:  normocephalic and atraumatic.   Mouth:  Oral mucosa and oropharynx without lesions or exudates.  Teeth in good repair.  Neck:  supple, full ROM, no masses, no thyromegaly, no thyroid nodules or tenderness, no JVD, normal carotid upstroke, no carotid bruits, and no cervical lymphadenopathy.   Lungs:  normal respiratory effort, no intercostal retractions, no accessory muscle use, normal breath sounds, no dullness, no fremitus, no crackles, and no wheezes.   Heart:  Normal rate and regular rhythm. S1 and S2 normal without gallop, murmur, click, rub or other extra sounds. Abdomen:  soft, non-tender, normal bowel sounds, no distention, no masses, no guarding, no rigidity, no rebound tenderness, + abdominal hernia, no inguinal hernia, no hepatomegaly, no splenomegaly, and abdominal scar(s).   Msk:  normal ROM, no joint tenderness, no joint swelling, no joint warmth, no redness over joints, and no joint deformities.   Pulses:  R and L carotid,radial,femoral,dorsalis pedis and posterior tibial pulses are full and equal bilaterally Extremities:  trace left pedal edema and trace right pedal edema.   Neurologic:  No cranial nerve deficits noted.  Station and gait are normal. Plantar reflexes are down-going bilaterally. DTRs are symmetrical throughout. Sensory, motor and coordinative functions appear intact. Skin:  turgor normal, color normal, no rashes, no suspicious lesions, no ecchymoses, no petechiae, no purpura, no ulcerations, and no edema.   Cervical Nodes:  no anterior cervical adenopathy and no posterior cervical adenopathy.   Axillary Nodes:  no R axillary adenopathy and no L axillary adenopathy.   Psych:  Cognition and judgment appear intact. Alert and cooperative with normal attention span and concentration. No apparent delusions, illusions, hallucinations  Diabetes Management Exam:    Foot Exam (with socks and/or shoes not present):       Sensory-Pinprick/Light touch:          Left medial foot (L-4): normal          Left dorsal foot (L-5): normal          Left lateral foot (S-1): normal          Right medial foot (L-4): normal          Right dorsal foot (L-5): normal          Right lateral foot (S-1): normal       Sensory-Monofilament:          Left foot: normal          Right foot: normal       Inspection:          Left foot: normal          Right foot: normal       Nails:          Left foot: normal          Right foot: normal   Impression & Recommendations:  Problem # 1:  COPD (ICD-496) Assessment Improved  His updated medication list for this problem includes:    Proair Hfa 108 (90 Base) Mcg/act Aers (Albuterol sulfate) .Marland Kitchen... 2 puffs four times a day as needed rescue    Dulera 100-5 Mcg/act Aero (Mometasone furo-formoterol fum) .Marland Kitchen... 2 puffs two times a day  Problem # 2:  DIABETES-TYPE 2 (ICD-250.00) Assessment: Improved  His updated medication list for this problem includes:    Bayer Low Strength 81 Mg Tbec (Aspirin) .Marland Kitchen... Take 1 tablet by mouth once a day    Azor 10-40 Mg Tabs (Amlodipine-olmesartan) ..... Once daily  Problem # 3:  HYPERTENSION (ICD-401.9) Assessment: Improved  His updated  medication list for this problem includes:    Azor 10-40 Mg Tabs (Amlodipine-olmesartan) .Marland KitchenMarland KitchenMarland KitchenMarland Kitchen  Once daily  BP today: 140/78 Prior BP: 138/82 (06/19/2010)  Prior 10 Yr Risk Heart Disease: 27 % (06/04/2009)  Labs Reviewed: K+: 4.2 (05/07/2010) Creat: : 1.0 (05/07/2010)   Chol: 166 (05/07/2010)   HDL: 38.30 (05/07/2010)   LDL: 107 (05/07/2010)   TG: 102.0 (05/07/2010)  Problem # 4:  HYPERLIPIDEMIA (ICD-272.4) Assessment: Unchanged  His updated medication list for this problem includes:    Lipitor 10 Mg Tabs (Atorvastatin calcium) .Marland Kitchen... Take 1 tablet by mouth once a day  Labs Reviewed: SGOT: 26 (05/07/2010)   SGPT: 21 (05/07/2010)  Lipid Goals: Chol Goal: 200 (04/05/2008)   HDL Goal: 40 (04/05/2008)   LDL Goal: 100 (04/05/2008)   TG Goal: 150 (04/05/2008)  Prior 10 Yr Risk Heart Disease: 27 % (06/04/2009)   HDL:38.30 (05/07/2010), 42.60 (05/02/2009)  LDL:107 (05/07/2010), 92 (16/02/9603)  Chol:166 (05/07/2010), 163 (05/02/2009)  Trig:102.0 (05/07/2010), 141.0 (05/02/2009)  Complete Medication List: 1)  Bayer Low Strength 81 Mg Tbec (Aspirin) .... Take 1 tablet by mouth once a day 2)  Azor 10-40 Mg Tabs (Amlodipine-olmesartan) .... Once daily 3)  Lipitor 10 Mg Tabs (Atorvastatin calcium) .... Take 1 tablet by mouth once a day 4)  Centrum Silver Tabs (Multiple vitamins-minerals) 5)  Vitamin E 400 Unit Caps (Vitamin e) .... Take 1 by mouth once daily 6)  Folic Acid 400 Mcg Tabs (Folic acid) .... Every evening 7)  Proair Hfa 108 (90 Base) Mcg/act Aers (Albuterol sulfate) .... 2 puffs four times a day as needed rescue 8)  Dulera 100-5 Mcg/act Aero (Mometasone furo-formoterol fum) .... 2 puffs two times a day  Patient Instructions: 1)  Please schedule a follow-up appointment in 3 months. 2)  It is important that you exercise regularly at least 20 minutes 5 times a week. If you develop chest pain, have severe difficulty breathing, or feel very tired , stop exercising immediately and seek  medical attention. 3)  You need to lose weight. Consider a lower calorie diet and regular exercise.  4)  Check your blood sugars regularly. If your readings are usually above 200 or below 70 you should contact our office. 5)  It is important that your Diabetic A1c level is checked every 3 months. 6)  See your eye doctor yearly to check for diabetic eye damage. 7)  Check your feet each night for sore areas, calluses or signs of infection. 8)  Check your Blood Pressure regularly. If it is above 130/80: you should make an appointment. Prescriptions: DULERA 100-5 MCG/ACT AERO (MOMETASONE FURO-FORMOTEROL FUM) 2 puffs two times a day  #1 inh x 11   Entered and Authorized by:   Etta Grandchild MD   Signed by:   Etta Grandchild MD on 07/18/2010   Method used:   Electronically to        [facility] [address] [id number]* (retail)       [address] [place], [place]  [address]       Ph: [phone]       Fax: [phone]   RxID:   [id number]    Orders Added: 1)  Est. Patient Level III [53664]

## 2010-08-06 ENCOUNTER — Ambulatory Visit (INDEPENDENT_AMBULATORY_CARE_PROVIDER_SITE_OTHER): Payer: Medicare Other | Admitting: Internal Medicine

## 2010-08-06 ENCOUNTER — Encounter: Payer: Self-pay | Admitting: Internal Medicine

## 2010-08-06 DIAGNOSIS — E785 Hyperlipidemia, unspecified: Secondary | ICD-10-CM

## 2010-08-06 DIAGNOSIS — I10 Essential (primary) hypertension: Secondary | ICD-10-CM

## 2010-08-06 DIAGNOSIS — E119 Type 2 diabetes mellitus without complications: Secondary | ICD-10-CM

## 2010-08-06 NOTE — Progress Notes (Signed)
Subjective:    Patient ID: [name], male    DOB: 1935-02-24, 76 y.o.   MRN: [id number]  Hyperlipidemia This is a chronic problem. The current episode started more than 1 year ago. The problem is uncontrolled. Recent lipid tests were reviewed and are high. Exacerbating diseases include diabetes. Factors aggravating his hyperlipidemia include fatty foods. Pertinent negatives include no chest pain, focal sensory loss, focal weakness, leg pain, myalgias or shortness of breath. Current antihyperlipidemic treatment includes exercise. The current treatment provides mild improvement of lipids. There are no compliance problems.  Risk factors for coronary artery disease include diabetes mellitus.      Review of Systems  Constitutional: Negative for fever, chills, diaphoresis, activity change, appetite change, fatigue and unexpected weight change.  HENT: Negative for facial swelling, neck pain and neck stiffness.   Respiratory: Negative for apnea, cough, choking, shortness of breath, wheezing and stridor.   Cardiovascular: Negative for chest pain, palpitations and leg swelling.  Gastrointestinal: Negative for nausea, vomiting, abdominal pain, diarrhea, constipation and abdominal distention.  Musculoskeletal: Negative for myalgias, back pain, arthralgias and gait problem.  Skin: Negative for color change, pallor and rash.  Neurological: Negative for dizziness, focal weakness, weakness, light-headedness, numbness and headaches.  Hematological: Negative for adenopathy. Does not bruise/bleed easily.   Lab Results  Component Value Date   WBC 4.0* 05/07/2010   HGB 13.0 05/07/2010   HCT 38.6* 05/07/2010   PLT 206.0 05/07/2010   CHOL 166 05/07/2010   TRIG 102.0 05/07/2010   HDL 38.30* 05/07/2010   ALT 21 05/07/2010   AST 26 05/07/2010   NA 140 05/07/2010   K 4.2 05/07/2010   CL 103 05/07/2010   CREATININE 1.0 05/07/2010   BUN 15 05/07/2010   CO2 29 05/07/2010   TSH 1.71 05/07/2010   PSA 0.00* 05/07/2010   HGBA1C 6.8*  05/07/2010   MICROALBUR 2.9* 05/07/2010   Lab Results  Component Value Date   LDLCALC 107* 05/07/2010      Objective:   Physical Exam  Constitutional: He is oriented to person, place, and time. He appears well-developed and well-nourished. No distress.  HENT:  Head: Normocephalic and atraumatic.  Right Ear: External ear normal.  Left Ear: External ear normal.  Nose: Nose normal.  Mouth/Throat: Oropharynx is clear and moist. No oropharyngeal exudate.  Eyes: Conjunctivae and EOM are normal. Pupils are equal, round, and reactive to light. Right eye exhibits no discharge. Left eye exhibits no discharge. No scleral icterus.  Neck: Normal range of motion. Neck supple. No thyromegaly present.  Cardiovascular: Normal rate, regular rhythm, normal heart sounds and intact distal pulses.  Exam reveals no gallop and no friction rub.   No murmur heard. Pulmonary/Chest: Effort normal and breath sounds normal. No respiratory distress. He has no wheezes. He has no rales. He exhibits no tenderness.  Abdominal: Soft. Bowel sounds are normal. He exhibits no distension and no mass. There is no tenderness. There is no rebound and no guarding.  Musculoskeletal: Normal range of motion. He exhibits no edema and no tenderness.  Lymphadenopathy:    He has no cervical adenopathy.  Neurological: He is alert and oriented to person, place, and time. He has normal reflexes. No cranial nerve deficit.  Skin: Skin is warm and dry. No rash noted. He is not diaphoretic. No erythema. No pallor.  Psychiatric: He has a normal mood and affect. His behavior is normal. Judgment and thought content normal.          Assessment &  Plan:

## 2010-08-06 NOTE — Assessment & Plan Note (Signed)
His recent A1C was excellent, will continue lifestyle treatments for now

## 2010-08-06 NOTE — Assessment & Plan Note (Signed)
BP is well controlled, continue same 

## 2010-08-06 NOTE — Assessment & Plan Note (Signed)
His most recent  LDL was not low enough for a diabetic so I have increased his dose to 80 mg of Lipitor

## 2010-08-06 NOTE — Patient Instructions (Signed)

## 2010-08-14 LAB — HEMOGLOBIN AND HEMATOCRIT, BLOOD
HCT: 34.7 % — ABNORMAL LOW (ref 39.0–52.0)
Hemoglobin: 11.6 g/dL — ABNORMAL LOW (ref 13.0–17.0)

## 2010-08-15 LAB — GLUCOSE, CAPILLARY
Glucose-Capillary: 106 mg/dL — ABNORMAL HIGH (ref 70–99)
Glucose-Capillary: 128 mg/dL — ABNORMAL HIGH (ref 70–99)

## 2010-08-15 LAB — COMPREHENSIVE METABOLIC PANEL
ALT: 19 U/L (ref 0–53)
AST: 28 U/L (ref 0–37)
Albumin: 3.5 g/dL (ref 3.5–5.2)
Alkaline Phosphatase: 89 U/L (ref 39–117)
GFR calc Af Amer: >60 mL/min (ref >60)
Glucose, Bld: 112 mg/dL — ABNORMAL HIGH (ref 70–99)
Potassium: 3.9 mEq/L (ref 3.5–5.1)
Sodium: 138 mEq/L (ref 135–145)
Total Protein: 6.3 g/dL (ref 6.0–8.3)

## 2010-08-15 LAB — URINALYSIS, ROUTINE W REFLEX MICROSCOPIC
Bilirubin Urine: NEGATIVE
Hgb urine dipstick: NEGATIVE
Ketones, ur: NEGATIVE mg/dL
Specific Gravity, Urine: 1.009 (ref 1.005–1.030)
Urobilinogen, UA: 0.2 mg/dL (ref 0.0–1.0)

## 2010-08-15 LAB — TYPE AND SCREEN: ABO/RH(D): A POS

## 2010-08-15 LAB — CBC
Hemoglobin: 13.1 g/dL (ref 13.0–17.0)
Platelets: 216 10*3/uL (ref 150–400)
RDW: 14.9 % (ref 11.5–15.5)

## 2010-08-15 LAB — HEMOGLOBIN AND HEMATOCRIT, BLOOD: Hemoglobin: 11.8 g/dL — ABNORMAL LOW (ref 13.0–17.0)

## 2010-08-23 ENCOUNTER — Other Ambulatory Visit: Payer: Self-pay | Admitting: *Deleted

## 2010-08-23 MED ORDER — AMLODIPINE-OLMESARTAN 10-40 MG PO TABS: 1.0000 | ORAL_TABLET | Freq: Every day | ORAL | Status: DC

## 2010-08-23 MED ORDER — ATORVASTATIN CALCIUM 80 MG PO TABS: 80.0000 mg | ORAL_TABLET | Freq: Every day | ORAL | Status: DC

## 2010-08-23 NOTE — Progress Notes (Signed)
Patient informed. 

## 2010-09-17 NOTE — Consult Note (Signed)
[name], Moran            ACCOUNT NO.:  [id number]   MEDICAL RECORD NO.:  [id number]          PATIENT TYPE:  INP   LOCATION:  4702                         FACILITY:  [facility]   PHYSICIAN:  Pramod P. Pearlean Brownie, MD    DATE OF BIRTH:  Jul 17, 1934   DATE OF CONSULTATION:  DATE OF DISCHARGE:                                 CONSULTATION   REASON FOR REFERRAL:  Syncope versus seizures.   HISTORY OF PRESENT ILLNESS:  [name] is a 76 year old African  American gentleman who had a brief episode of loss of consciousness on  Sunday morning in church.  The patient states he had not been feeling  well for the past 3 days and was feeling weak all over with some sweats.  On Sunday morning he did not have breakfast before he went to church.  He was sitting in the service for an hour.  The last thing he noticed  was that he was feeling hot and not well.  His wife was sitting right  next to him.  She heard the Bible fall out of his hand.  When the  patient did not respond to pick it up, she turned around and noticed  that he had a blank look on his face.  He was staring forward and then  soon he was arching his body backwards and had some mild tremulous  movements.  She called for help.  There were several people who were  nurses or EMS in the congregation who made the patient lay flat.  He was  probably unresponsive for 5 minutes.  The wife states she heard the  other people say that they could not initially get a pulse, but  subsequently he has good pulses.  Blood glucose was check.  It was 138  mg percent.  The patient subsequently regained consciousness.  He was  initially a little disoriented as to how he landed on the floor, but his  mind was quite sharp.  He knew exactly what had happened and could  recall events leading up to his passing out quite well.  He did not have  any headache, dizziness, focal extremity weakness, gait or balance  problems.  He does have a previous history of  syncope about 8 years ago  when while at work he suddenly fell backwards.  At the time, he had only  brief loss of consciousness.  He did seek medical attention and had an  extensive cardiac evaluation including stress test and echocardiogram  which were unremarkable.  The patient has no known history of stroke,  TIA, seizures, or significant neurological problems.   PAST MEDICAL HISTORY:  1. COPD.  2. Hypertension.  3. Asthmatic bronchitis.   HOSPITAL COURSE:  [facility].   SOCIAL HISTORY:  The patient is married.  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  He  lives with his wife.  He has history of smoking for 18 years but quit  more than 20 years ago.   REVIEW OF SYSTEMS:  As documented above.   PHYSICAL EXAMINATION:  GENERAL:  A pleasant, obese, middle-aged [place]  American male who is  at present not in distress.  VITAL SIGNS:  He is of febrile today with temperature of 97.62.  On  admission, his temperature was 103.1.  Pulse rate is 98, regular sinus,  respiratory rate of 20, blood pressure 146/68, saturations 92% on room  air.  HEENT:  Head is nontraumatic.  ENT exam is unremarkable.  NECK:  Supple without bruit.  CARDIAC:  No murmur or gallop.  LUNGS:  Clear to auscultation.  ABDOMEN:  Soft, nontender.  NEUROLOGIC:  The patient is pleasant, awake, alert, cooperative.  There  is no aphasia, apraxia, or dysarthria.  Pupils are equal and reactive.  Movements are full range without nystagmus.  Face is symmetric.  Palatal  movements are normal.  Tongue is midline.  Motor system exam reveals no  upper extremity drift, symmetric strength, tone, reflexes, coordination,  sensation.  Gait is steady.   LABORATORY DATA:  Urinalysis is normal.  Homocysteine is normal.  Basic  metabolic panel is unremarkable.  Blood cultures are negative so far.  UA is negative.  Cardiac enzymes are negative.  Spinal tap shows 1 WBC,  1 red cell, with protein of 48 mg percent and glucose of 82, both of  which were  normal.  CT scan of the head, noncontrast study, done on November 15, 2006 shows no acute abnormality.  MRI scan of the brain showed no  acute infarct or trauma.  There is a 1-cm region of susceptibility  artifact noted mainly on the gradient echo sequences in the right  superior cerebellum.  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  EEG has been done but results are not  back yet.  2-D echocardiogram done yesterday reveals normal ejection  fraction without obvious cardiac source of embolism.  Telemetry  monitoring so far is negative for arrhythmias.   IMPRESSION:  71-hour gentleman with episode of brief loss of  consciousness with some extremity jerking, likely of prolonged syncopal  event and doubt this represents a primary seizure.  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   PLAN:  I had a long discussion with the patient and his wife regarding  his symptoms, discussing the neurological prognosis.  Plan for  evaluation and treatment.  I would not recommend definite  anticonvulsants at the present time.  We will follow the results of EEG.  If he has recurring episodes in the future, may reconsider.  Continue  cardiac workup for syncope as per you.  No further diagnostic  neurological testing is indicated at the present time.  Thank you for  the referral.           ______________________________  [name]. Pearlean Brownie, MD     PPS/MEDQ  D:  11/18/2006  T:  11/19/2006  Job:  [id number]

## 2010-09-17 NOTE — Op Note (Signed)
[name], TANORI            ACCOUNT NO.:  [id number]   MEDICAL RECORD NO.:  [id number]          PATIENT TYPE:  INP   LOCATION:  0002                         FACILITY:  [facility]   PHYSICIAN:  Ronald L. Earlene Plater, M.D.  DATE OF BIRTH:  1934-06-26   DATE OF PROCEDURE:  08/02/2008  DATE OF DISCHARGE:                               OPERATIVE REPORT   PREOPERATIVE DIAGNOSES:  Gleason 7 prostate cancer.   POSTOPERATIVE DIAGNOSES:  Gleason 7 prostate cancer.   PROCEDURES PERFORMED:  1. Robotic-assisted laparoscopic radical prostatectomy with bladder      neck reconstruction.  2. Bilateral pelvic lymph node dissection.   SURGEON:  Lucrezia Starch. Earlene Plater, M.D.   RESIDENT:  Georgeanna Lea, M.D.   ANESTHESIA:  General.   DRAINS:  1. #20-French coude catheter periurethrally.  2. JP drain.   COMPLICATIONS:  None.   EBL:  300 mL.   INDICATIONS FOR PROCEDURE:  Patient is a 76 year old African American  male who was diagnosed with Gleason 4 + 3 = 7 prostate cancer and done  for abnormal DRE with a preoperative PSA of 4.14.  Patient had four  cores out of the twelve positive for Gleason 7 prostate cancer.  Patient  underwent metastatic workup, which was negative.  Different treatment  options were discussed.  Patient opted for robotic-assisted laparoscopic  radical prostatectomy with bilateral lymph node dissection.  Risks and  benefits of the procedure were explained, informed consent obtained.   DESCRIPTION OF PROCEDURE:  Patient was brought to the operating room,  placed in supine position, administered general anesthesia by the  anesthesia team.  Proper time-out was performed, verifying correct  patient, procedure and site.  Patient was given appropriate preoperative  antibiotic.  Patient was subsequently placed in dorsal lithotomy  position.  Pressure points were well-padded.  Bilateral lower extremity  SCDs were applied.  Patient was prepped and draped in the usual sterile  manner.   Over the sterile operative field we placed a 22-French Foley  catheter.  Clear urine was seen effluxing.   We then made an umbilical incision and dissected down to the peritoneum  to the rectus fascia.  We then placed a camera port.  We then started  insufflating the abdomen.  We then performed laparoscopy.  Minimal  adhesions were seen in the proximal aspect of the abdomen along the left  side.  Otherwise, there was no evidence of any bleeding or metastatic  disease.  We then, under laparoscopic vision, placed additional ports.  Two robotic ports were placed on the left side under laparoscopic  vision, approximately 100 away from the camera port, 15 cm from pubic  symphysis; the left lateral robotic port was placed four fingerbreadths  away from the middle port and four fingerbreadths above the anterior  superior iliac spine.  We then placed a right robotic port along the  right side of the abdomen, again one hand's width away from the camera  port and 15 cm from pubic symphysis.  Two additional ports were placed,  5-mm assistant port between the camera port and the right robotic port,  and a 12-mm  assistant port along the right lower quadrant, four  fingerbreadths above the anterior superior iliac spine.  We then  introduced the instruments and docked the robot.   We began our procedure by incising the peritoneal folds, bilateral  medial umbilical ligaments.  We then continued our dissection by  developing space of Retzius anteriorly.  We were able to dissect all the  way down to the bladder neck and prostate area.  The superficial dorsal  vein was coagulated with bipolar cautery.  We then dissected around the  deep dorsal vein complex.  We then released the endopelvic fascia  bilaterally.  We then fired an endovascular stapler across the DVC.  Good control of the DVC was seen.  We then identified the bladder neck.  We then incised on the anterior bladder neck area, dissected through  the  tissues.  While dissecting on the right lateral aspect, it was seen that  patient has bilobar enlarged lateral lobes of the prostate, coming into  the bladder.  These were meticulously dissected off the bladder.  We  then continued dissection posterior to the bladder.  Bilateral seminal  vesicles and ampulla of the vas deferens were identified.  The vas  deferens was clipped bilaterally.  After meticulous dissection, the cut  end of the ampulla of vas deferens and bilateral seminal vesicles were  identified, isolated.  We then dissected bilateral pedicles.  Control of  the pedicles was obtained using Hem-o-lok clips.  Of note:  There was a  nerve-sparing dissection on the left side.  However, the right side  neurovascular bundle was thickened because of the high-volume disease on  the right side.  We then incised along the prostatic-urethral junction  and after incising it circumferentially, had the prostate freed up.  This was left in the abdominal cavity in one site.   We then went about doing bilateral lymph node dissections.  Of note  that, while dissecting on the right-most lateral aspect of the prostate,  after dissecting the ampulla of the vas deferens seminal vesicle, an  additional cystic structure was seen along the posterolateral aspect.  This was dissected all the way to the insertion of the vas deferens  seminal vesicle distally onto the prostate and proximally separate from  the bladder.  A small opening was made into this cystic structure.  Whitish, turbid fluid was seen effluxing.  At the same point, we had  given patient indigo carmine and examined the bladder neck area.  Right  and left ureteric orifices were identified in their normal anatomic  position.  They were effluxing light-blue urine.  Light-blue urine was  seen effluxing through the opening made in the cystic structure.  We  then applied Hem-o-lok clip distal to it.  This was taken along with the   prostate.   Going back to the bilateral pelvic lymph node dissection, external iliac  vessels were identified bilaterally.  Genitofemoral obturator nerve was  identified bilaterally.  The fibrofatty tissue in between the external  iliac vessels and the obturator nerve was dissected.  Hem-o-lok clips  were applied distally and proximally and lymph node tissue was sent  separately.   We then irrigated the pelvic cavity.  We did our rectal insufflation  test.  No gas bubbles were seen.  We then looked for hemostasis.  Adequate hemostasis was seen.  We then used a 3- 0 Vicryl suture and  reconstructed the bladder neck to the size of the urethra.  We then used  a 2-0 Vicryl suture and applied a stay suture, approximating the bladder  neck and the urethra.  We then used a 3-0 Monocryl suture, dyed purple  and white, and completed our bladder neck-urethral anastomosis.  We then  introduced a #20 [place] coude catheter into patient's bladder.  The  balloon was inflated to 15 mL.  The bladder was irrigated and there was  no gross leakage seen.  We then placed a Jackson-Pratt drain through the  left lateral robotic port into the pelvic cavity.  This was secured at  skin level using 3-0 nylon suture.  The specimen was transferred into an  EndoCatch bag.  The robot was then undocked.  We then closed the 12-mm  assistant port using 0 Vicryl suture through a suture passer.  We then  removed all the ports under laparoscopic vision.  No bleeding was seen  from the ports.  We then extended our umbilical incision and removed the  specimen, intact.  We then closed the umbilical incision with 0 Vicryl  suture in a running manner.  We then irrigated and injected local  anesthetic into the incision and closed the skin incision, using skin  staplers.   This marked the end of the procedure.  Patient was subsequently  extubated and transferred, in stable condition, to the recovery room.   Of note:  Dr. Earlene Plater  was present and available for the case.     ______________________________  Georgeanna Lea, MD      Lucrezia Starch. Earlene Plater, M.D.  Electronically Signed    JJ/MEDQ  D:  08/02/2008  T:  08/02/2008  Job:  [id number]

## 2010-09-17 NOTE — Discharge Summary (Signed)
[name], [name]            ACCOUNT NO.:  [id number]   MEDICAL RECORD NO.:  [id number]          PATIENT TYPE:  INP   LOCATION:  4702                         FACILITY:  [facility]   PHYSICIAN:  Lonia Blood, M.D.       DATE OF BIRTH:  1935/01/30   DATE OF ADMISSION:  11/15/2006  DATE OF DISCHARGE:  11/19/2006                               DISCHARGE SUMMARY   PRIMARY CARE PHYSICIAN:  Dr. Bradd Canary   DISCHARGE DIAGNOSES:  1. Syncope - unclear etiology.  2. Aspiration pneumonia - resolving.  3. Chronic obstructive pulmonary disease.  4. Hypertension.  5. Impaired glucose tolerance with a hemoglobin A1c of 6.9.  6. Hyperlipidemia.  7. Indeterminate lung nodule.  Followup CT recommended in 6 months.   DISCHARGE MEDICATIONS:  1. Aspirin 81 mg daily.  2. Caduet 5/20 mg daily.  3. Combivent one puff three times a day.  4. Avelox 400 mg by mouth daily for one week.   CONDITION AT DISCHARGE:  [name] was discharged in good condition.  At the time of the discharge the patient was instructed follow up with  his primary care physician, Dr. Artis Flock, in regards to his newly-diagnosed  impaired glucose tolerance.  The patient was set up for outpatient  diabetes education.  The patient was instructed to follow a low  carbohydrate diet.  The patient was told not to drive for at least 2  weeks.   PROCEDURES DURING THIS ADMISSION:  1. November 15, 2006, the patient under went computed tomography scan of      the chest, findings negative for pulmonary emboli, right upper      pneumonia, and pneumonia of the posterior basilar segment of the      left lower lobe as well, right lower lobe nodule.  Recommend      followup CT in 6 months.  2. November 16, 2006, lumbar puncture under fluoroscopy.  3. 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  4. November 17, 2006, EEG; findings of EEG within normal limits.  5. November 16, 2006, transthoracic  echocardiogram; findings of ejection      fraction 60-65%, mild calcification of the aortic and mitral valve,      moderate increase in pulmonary artery pressure.   CONSULTATIONS THIS ADMISSION:  The patient was seen in consultation by  Dr. Pearlean Brownie from neurology.   HISTORY AND PHYSICAL:  Refer dictated H&P done by Dr. Eda Paschal November 15, 2006.   HOSPITAL COURSE:  #1 - SYNCOPE.  [name] was observed for 72 hours  on telemetry without any arrhythmias.  His echocardiogram revealed a  good ejection fraction.  He did not have any orthostatic hypotension.  The patient's initial episode was also worrisome for a seizure and we  have obtained an EEG which was negative for epileptiform activity.  The  patient was also seen in consultation by Dr. Pearlean Brownie from neurology who  did not recommend any antiepileptics.  As we have not identified a clear  cause of the patient's syncopal event,  we have just recommended medical  therapy with ongoing risk factor modification, no driving, and close  followup with the primary care physician.   #2 - FEVER AND INFILTRATES.  [name] had on the day of admission  acute onset of hyperthermia and altered mental status.  It was initially  difficult to sort out the exact cause of these changes.  The patient had  a lumbar puncture which was essentially normal.  A computed tomography  scan of the chest, though, revealed presence of bilateral infiltrates  which were suggestive of aspiration pneumonia which could have occurred  at the time of the patient's syncopal event.  In any case, [name]  responded well to treatment with steroids, antibiotics and nebulizer  treatments.  His respiratory status at the time of discharge is markedly  improved.  The patient was discharged afebrile and with saturation 94%  on room air.   #3 - IMPAIRED GLUCOSE TOLERANCE.  [name] was found to have  elevated to fasting plasma glucose levels.  To further sort this out we   have obtained a hemoglobin A1c which was elevated 6.9.  The patient was  set up with outpatient diabetes education as well as was instructed  follow a diabetic diet.  He was also told to see his primary care  physician for further diabetic treatment.      Lonia Blood, M.D.  Electronically Signed     SL/MEDQ  D:  11/19/2006  T:  11/20/2006  Job:  [id number]   cc:   Quita Skye. Artis Flock, M.D.

## 2010-09-17 NOTE — H&P (Signed)
[name], [name]            ACCOUNT NO.:  [id number]   MEDICAL RECORD NO.:  [id number]          PATIENT TYPE:  INP   LOCATION:  2919                         FACILITY:  [facility]   PHYSICIAN:  Joseph I Elsaid, MD      DATE OF BIRTH:  1934-08-27   DATE OF ADMISSION:  11/15/2006  DATE OF DISCHARGE:                              HISTORY & PHYSICAL   PRIMARY CARE PHYSICIAN:  Dr. Penni Moran.   HISTORY OF PRESENT ILLNESS:  A 76 year old African American male who has  a history of hypertension, history of asthma/COPD, who was in good  health until today when he was sitting in church.  History, mainly, was  presented by the wife, part of it by the patient.  Patient was sitting  at the church reading the bible.  He was complaining, at that time, of  excessive diaphoresis and feeling hot.  He denies any chest pain or  shortness of breath.  He felt blurring of vision and then the wife  noticed the bible fell from the patient and then the patient had  extended his leg and started jerking on his upper arm and face.  She  could not recall any rolling up of his eyes.  There was no bite of  tongue.  There was no urine incontinence.  There is no bowel  incontinence.  Condition lasted for 5 minutes and then he woke up and he  knew where he was, but he could not recall what exactly happened.  He  denies any history of dizziness before the episode.  Denies any chest  pain.  He complained of shortness of breath in the ER.  Also complained  of feeling cold and shivering.  According to the EMS, patient was  completely alert and oriented when he was picked up by them.  Patient  did not eat his breakfast that day, but they usually do not eat  breakfast because they will have a big lunch on Sunday.  The patient was  transported by EMS without complaint, except for decrease in the  diaphoresis.  No chest pain.  No discomfort before or after, but the  patient complained of shortness of breath at the hospital.   Denies any  headache.  Denies any numbness or weakness of his extremities and he  cannot recall what happened.   MEDICATIONS:  Caduet, dose unknown.   PAST MEDICAL HISTORY:  1. History of hypertension.  2. History of asthma and bronchitis.  3. History of collapse 4 years ago with no obvious cause.   REVIEW OF SYSTEMS:  Complained of chills and cough at the ER, shortness  of breath.  No chest pain.  No nausea.  No vomiting.  No complaint of  blurring vision.  Denies any back pain.  Denies any numbness or weakness  of his extremities.  Denies any change in his bowel habit.   FAMILY HISTORY:  Father died of prostate cancer.  Mother history of  stroke.   SOCIAL HISTORY:  He is married.  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  Lives with his  wife, have 2 boys.  History of  smoking, quit more than 18 years and  history of alcohol, quit more than 20 years.  No history of IV drug  abuse.   PHYSICAL EXAMINATION:  GENERAL:  Patient lying on bed, alert and  oriented x3.  No obvious diaphoresis.  VITAL SIGNS:  On admission, the blood pressure was 145/62.  Pulse 76.  Respiratory rate 22.  Saturating 95% on 2 liters.  Patient became  hypoxic to 84 if remove the 2 liters.  HEENT:  Pupils equal and reactive to light and accommodation.  Extraocular muscle movement was normal.  NECK:  No JVD.  No  lymphadenopathy.  No thyromegaly.  EXAMINATION OF THE HEART:  S1 and S2, like 4, but no added sounds.  EXAMINATION OF THE CHEST:  Diminished air entry bilaterally.  ABDOMEN:  Distended.  Bowel sounds positive.  No area of tenderness.  No  guarding or rebound tenderness.  LOWER EXTREMITIES:  Peripheral pulses are intact and there is no lower  limb edema.  CNS EXAMINATION:  Patient was alert and oriented x3.  Cranial nerves II-  XII is intact and patient moved all upper extremities and lower  extremities and power was 5/5 on both legs.  Gait was not done as the  patient lying in bed at this time.  Glasgow Coma Scale  15/15.   LABORATORY DATA:  Sodium 138, potassium 4.8, chloride 104, BUN 14,  glucose 119, bicarb 26.  Hemoglobin of 14.3, hematocrit 42.0, creatinine  1.3, white blood cell is 4.5.  Hemoglobin, as I mentioned 13.1, platelet  175.  CK-MB 1.2, troponin less than 0.05.  INR 1.1.  Magnesium 2.0.  CT  scan of the head, preliminary report, was negative.  Chest x-ray:  No  active lung disease.   ADMITTING DIAGNOSES/ASSESSMENT/PLAN:  1. Syncope versus seizure.  The above history coming more with the      syncope than a seizure, but the wife admitted the patient was      jerking during the event, so we are going to admit the patient for      telemetry.  We will get an MRI of his brain.  We will get an EEG      for evaluation of the seizure and no need for antibiotic medication      at this time.  This is the first result if it is seizure.  My      opinion, I do not think a seizure, more probably a syncopal      episode.  We will admit the patient, get cardiac enzymes.  A 2D      echo.  We will put the patient on seizure precautions.  Further      recommendations of the patient at the hospital.  2. Shortness of breath with some hypoxia at the emergency room.  I      will go ahead and order CT chest with IV contrast to rule out      pulmonary embolism.  3. Hypertension.  We will continue Coreg.  4. History of asthma.  Patient has no wheezing at this time.  We will      continue with his nebs p.r.n.  5. Deep venous thrombosis and gastrointestinal prophylaxis.      Joseph Bosie Helper, MD  Electronically Signed     HIE/MEDQ  D:  11/15/2006  T:  11/16/2006  Job:  [id number]

## 2010-09-17 NOTE — Procedures (Signed)
EEG NUMBER:  [id number].   HISTORY:  This is a 76 year old with episode of face and arm jerking who  is having an EEG done to evaluate for seizures.   PROCEDURE:  This is a portable EEG.   TECHNICAL DESCRIPTION:  Throughout this portable EEG there is no  distinct or sustained posterior diameter rhythm noted.  The background  activity is symmetric mostly comprised of low voltage alpha and beta  range activity of 5-10 microvolts.  The patient does occasionally have  facial and eye twitching during this recording which just shows EMG and  movement artifact.  There is no definitive epileptiform activity noted  during this time.  Photic stimulation nor hypoventilation performed  throughout this recording.  The patient does become drowsy and  eventually falls asleep during this recording.  Throughout this record  there was no evidence of a __________ or graphic seizure or interictal  discharge activity.  EKG tracing shows a heart rate of around 100 beats  per minute.   IMPRESSION:  This portable EEG is essentially within normal limits in  the awake and sleep states.  The presence of beta in the background is  highly suggestive of a medication effect.  Clinical correlation is  advised.      Bevelyn Buckles. Nash Shearer, M.D.  Electronically Signed     HQI:ONGE  D:  11/17/2006 17:45:58  T:  11/18/2006 13:43:23  Job #:  [id number]

## 2010-09-20 NOTE — Assessment & Plan Note (Signed)
[facility]                             PULMONARY OFFICE NOTE   NAME:[name], [name]                   MRN:          [id number]  DATE:06/03/2006                            DOB:          [birth year]    PROBLEM:  Chronic obstructive pulmonary disease/asthma.   HISTORY:  He was last here with a viral syndrome exacerbation in 2006  and returns now with a similar history.  He caught a chest cold after  exposure to grandchildren around Christmas time.  Sputum was white.  There has been no fever or chest pain, but he has a persistent chest  rattle and wheeze.   MEDICATIONS:  1. Norvasc.  2. Lipitor.  3. Mucinex DM p.r.n.   ALLERGIES:  No known drug allergies.   OBJECTIVE:  VITAL SIGNS:  Weight 153 pounds, blood pressure 162/86,  pulse regular 77, room air saturation 95%.  CHEST:  Unlabored, mild chest congestion.  HEART:  Heart sounds are regular without murmur.  NECK:  There is no neck vein distention or edema.  Pulses are regular.   IMPRESSION:  Exacerbation of asthma with bronchitis.   Note; the PFT's in April of 2006 had shown obstruction with an FEV1 of  2.50 (72% of predicted) after bronchodilator.   PLAN:  Nebulizer treatment, Xopenex 1.25 mg, Depo-Medrol 80 mg IM,  steroid talk, medication options were reviewed.  Schedule return in 1  year, but earlier p.r.n.  If he fails to clear, we will look at  maintenance and rescue bronchodilator therapies.     Clinton D. Maple Hudson, MD, Tonny Bollman, FACP  Electronically Signed    CDY/MedQ  DD: 06/03/2006  DT: 06/03/2006  Job #: [id number]   cc:   Quita Skye. Artis Flock, M.D.

## 2010-10-17 ENCOUNTER — Encounter: Payer: Self-pay | Admitting: Internal Medicine

## 2010-10-18 ENCOUNTER — Encounter: Payer: Self-pay | Admitting: Internal Medicine

## 2010-10-18 ENCOUNTER — Ambulatory Visit (INDEPENDENT_AMBULATORY_CARE_PROVIDER_SITE_OTHER): Payer: Medicare Other | Admitting: Internal Medicine

## 2010-10-18 ENCOUNTER — Other Ambulatory Visit (INDEPENDENT_AMBULATORY_CARE_PROVIDER_SITE_OTHER): Payer: Medicare Other

## 2010-10-18 DIAGNOSIS — E785 Hyperlipidemia, unspecified: Secondary | ICD-10-CM

## 2010-10-18 DIAGNOSIS — I10 Essential (primary) hypertension: Secondary | ICD-10-CM

## 2010-10-18 DIAGNOSIS — E119 Type 2 diabetes mellitus without complications: Secondary | ICD-10-CM

## 2010-10-18 LAB — COMPREHENSIVE METABOLIC PANEL
Albumin: 4 g/dL (ref 3.5–5.2)
BUN: 14 mg/dL (ref 6–23)
Calcium: 8.9 mg/dL (ref 8.4–10.5)
Chloride: 106 mEq/L (ref 96–112)
Creatinine, Ser: 0.9 mg/dL (ref 0.4–1.5)
Glucose, Bld: 110 mg/dL — ABNORMAL HIGH (ref 70–99)
Potassium: 3.6 mEq/L (ref 3.5–5.1)

## 2010-10-18 LAB — HEMOGLOBIN A1C: Hgb A1c MFr Bld: 7.1 % — ABNORMAL HIGH (ref 4.6–6.5)

## 2010-10-18 NOTE — Assessment & Plan Note (Signed)
No changes on this today

## 2010-10-18 NOTE — Assessment & Plan Note (Signed)
BP is well controlled, I will monitor his lytes and renal function today 

## 2010-10-18 NOTE — Progress Notes (Signed)
Subjective:    Patient ID: [name], male    DOB: 1934-05-16, 76 y.o.   MRN: [id number]  Diabetes He presents for his follow-up diabetic visit. He has type 2 diabetes mellitus. The initial diagnosis of diabetes was made 3 years ago. His disease course has been improving. There are no hypoglycemic associated symptoms. Pertinent negatives for hypoglycemia include no dizziness, headaches, seizures, speech difficulty, sweats or tremors. Pertinent negatives for diabetes include no blurred vision, no chest pain, no fatigue, no foot paresthesias, no foot ulcerations, no polydipsia, no polyphagia, no polyuria, no visual change, no weakness and no weight loss. There are no hypoglycemic complications. Current diabetic treatment includes diet. His weight is stable. He is following a generally healthy diet. Meal planning includes avoidance of concentrated sweets. He has not had a previous visit with a dietician. He participates in exercise intermittently. There is no change in his home blood glucose trend. His breakfast blood glucose range is generally 70-90 mg/dl. His lunch blood glucose range is generally 90-110 mg/dl. His dinner blood glucose range is generally 90-110 mg/dl. His highest blood glucose is 90-110 mg/dl. His overall blood glucose range is 90-110 mg/dl. An ACE inhibitor/angiotensin II receptor blocker is being taken. He does not see a podiatrist.Eye exam is current.  Hypertension This is a chronic problem. The current episode started more than 1 year ago. The problem has been gradually improving since onset. The problem is controlled. Pertinent negatives include no anxiety, blurred vision, chest pain, headaches, malaise/fatigue, neck pain, orthopnea, palpitations, peripheral edema, PND, shortness of breath or sweats. There are no associated agents to hypertension. Past treatments include calcium channel blockers and angiotensin blockers. The current treatment provides significant improvement.  Compliance problems include diet and exercise.       Review of Systems  Constitutional: Negative for fever, chills, weight loss, malaise/fatigue, diaphoresis, activity change, appetite change, fatigue and unexpected weight change.  HENT: Negative for facial swelling, neck pain and neck stiffness.   Eyes: Negative.  Negative for blurred vision.  Respiratory: Negative for cough, choking, chest tightness, shortness of breath, wheezing and stridor.   Cardiovascular: Negative for chest pain, palpitations, orthopnea, leg swelling and PND.  Gastrointestinal: Negative.   Genitourinary: Negative.  Negative for polyuria.  Musculoskeletal: Negative.   Neurological: Negative for dizziness, tremors, seizures, syncope, facial asymmetry, speech difficulty, weakness, light-headedness, numbness and headaches.  Hematological: Negative for polydipsia, polyphagia and adenopathy. Does not bruise/bleed easily.  Psychiatric/Behavioral: Negative.        Objective:   Physical Exam  Vitals reviewed. Constitutional: He is oriented to person, place, and time. He appears well-developed and well-nourished. No distress.  HENT:  Head: Normocephalic and atraumatic.  Right Ear: External ear normal.  Left Ear: External ear normal.  Nose: Nose normal.  Mouth/Throat: Oropharynx is clear and moist. No oropharyngeal exudate.  Eyes: Conjunctivae and EOM are normal. Pupils are equal, round, and reactive to light. Right eye exhibits no discharge. Left eye exhibits no discharge. No scleral icterus.  Neck: Normal range of motion. Neck supple. No JVD present. No tracheal deviation present. No thyromegaly present.  Cardiovascular: Normal rate, regular rhythm, normal heart sounds and intact distal pulses.  Exam reveals no gallop and no friction rub.   No murmur heard. Pulmonary/Chest: Effort normal and breath sounds normal. No stridor. No respiratory distress. He has no wheezes. He has no rales. He exhibits no tenderness.    Abdominal: Soft. Bowel sounds are normal. He exhibits no distension and no mass. There is  no tenderness. There is no rebound and no guarding.  Musculoskeletal: Normal range of motion. He exhibits no edema and no tenderness.  Lymphadenopathy:    He has no cervical adenopathy.  Neurological: He is alert and oriented to person, place, and time. He has normal reflexes. No cranial nerve deficit.  Skin: Skin is warm and dry. No rash noted. He is not diaphoretic. No erythema. No pallor.  Psychiatric: He has a normal mood and affect. His behavior is normal. Judgment and thought content normal.         Lab Results  Component Value Date   WBC 4.0* 05/07/2010   HGB 13.0 05/07/2010   HCT 38.6* 05/07/2010   PLT 206.0 05/07/2010   CHOL 166 05/07/2010   TRIG 102.0 05/07/2010   HDL 38.30* 05/07/2010   ALT 21 05/07/2010   AST 26 05/07/2010   NA 140 05/07/2010   K 4.2 05/07/2010   CL 103 05/07/2010   CREATININE 1.0 05/07/2010   BUN 15 05/07/2010   CO2 29 05/07/2010   TSH 1.71 05/07/2010   PSA 0.00* 05/07/2010   INR 1.0 07/24/2008   HGBA1C 6.8* 05/07/2010   MICROALBUR 2.9* 05/07/2010   Assessment & Plan:

## 2010-10-18 NOTE — Patient Instructions (Signed)
Diabetes, Type 2 Diabetes is a lasting (chronic) disease. In type 2 diabetes, the pancreas does not make enough insulin (a hormone), and the body does not respond normally to the insulin that is made. This type of diabetes was also previously called adult onset diabetes. About 90% of all those who have diabetes have type 2. It usually occurs after the age of 40 but can occur at any age. CAUSES Unlike type 1 diabetes, which happens because insulin is no longer being made, type 2 diabetes happens because the body is making less insulin and has trouble using the insulin properly. SYMPTOMS  Drinking more than usual.   Urinating more than usual.   Blurred vision.   Dry, itchy skin.   Frequent infection like yeast infections in women.   More tired than usual (fatigue).  TREATMENT  Healthy eating.   Exercise.   Medication, if needed.   Monitoring blood glucose (sugar).   Seeing your caregiver regularly.  HOME CARE INSTRUCTIONS  Check your blood glucose (sugar) at least once daily. More frequent monitoring may be necessary, depending on your medications and on how well your diabetes is controlled. Your caregiver will advise you.   Take your medicine as directed by your caregiver.   Do not smoke.   Make wise food choices. Ask your caregiver for information. Weight loss can improve your diabetes.   Learn about low blood glucose (hypoglycemia) and how to treat it.   Get your eyes checked regularly.   Have a yearly physical exam. Have your blood pressure checked. Get your blood and urine tested.   Wear a pendant or bracelet saying that you have diabetes.   Check your feet every night for sores. Let your caregiver know if you have sores that are not healing.  SEEK MEDICAL CARE IF:  You are having problems keeping your blood glucose at target range.   You feel you might be having problems with your medicines.   You have symptoms of an illness that is not improving after 24  hours.   You have a sore or wound that is not healing.   You notice a change in vision or a new problem with your vision.   You develop a fever of more than 100.5.  Document Released: 04/21/2005 Document Re-Released: 05/13/2009 [employer] Patient Information 2011 [employer]. 

## 2010-10-18 NOTE — Assessment & Plan Note (Signed)
It sounds like his blood sugars are well controlled, I will check his A1C today and monitor his renal function

## 2010-10-31 ENCOUNTER — Other Ambulatory Visit: Payer: Self-pay | Admitting: *Deleted

## 2010-10-31 MED ORDER — ATORVASTATIN CALCIUM 80 MG PO TABS: 80.0000 mg | ORAL_TABLET | Freq: Every day | ORAL | Status: DC

## 2010-10-31 NOTE — Progress Notes (Signed)
Pt req RF, done,  left vm for pt

## 2010-11-05 ENCOUNTER — Telehealth: Payer: Self-pay

## 2010-11-05 MED ORDER — MOMETASONE FURO-FORMOTEROL FUM 100-5 MCG/ACT IN AERO: 2.0000 | INHALATION_SPRAY | Freq: Two times a day (BID) | RESPIRATORY_TRACT | Status: DC

## 2010-11-05 NOTE — Telephone Encounter (Signed)
Refill request for Dulera 100-26mcg to [facility] supply

## 2010-12-04 ENCOUNTER — Telehealth: Payer: Self-pay

## 2010-12-04 NOTE — Telephone Encounter (Signed)
Wife lmovm for someone to call her back, did not indicate why

## 2010-12-06 ENCOUNTER — Other Ambulatory Visit: Payer: Self-pay | Admitting: *Deleted

## 2010-12-06 MED ORDER — AMLODIPINE-OLMESARTAN 10-40 MG PO TABS: 1.0000 | ORAL_TABLET | Freq: Every day | ORAL | Status: DC

## 2010-12-06 NOTE — Progress Notes (Signed)
Needs rf, done

## 2011-02-17 LAB — CBC
Hemoglobin: 12.1 — ABNORMAL LOW
MCHC: 33.4
MCV: 82.3
RBC: 4.41
RBC: 4.44
WBC: 12.6 — ABNORMAL HIGH

## 2011-02-17 LAB — BASIC METABOLIC PANEL
CO2: 25
Calcium: 8.6
Calcium: 8.7
Chloride: 103
Creatinine, Ser: 1.13
GFR calc Af Amer: >60
GFR calc Af Amer: >60
GFR calc non Af Amer: >60
Potassium: 3.6
Sodium: 137

## 2011-02-18 LAB — MAGNESIUM: Magnesium: 2

## 2011-02-18 LAB — I-STAT 8, (EC8 V) (CONVERTED LAB)
Acid-Base Excess: 1
Bicarbonate: 26.7 — ABNORMAL HIGH
Chloride: 104
HCT: 42
Operator id: 257131
TCO2: 28
pCO2, Ven: 46.9

## 2011-02-18 LAB — HEMOGLOBIN A1C: Hgb A1c MFr Bld: 6.9 — ABNORMAL HIGH

## 2011-02-18 LAB — URINALYSIS, ROUTINE W REFLEX MICROSCOPIC
Bilirubin Urine: NEGATIVE
Hgb urine dipstick: NEGATIVE
Nitrite: NEGATIVE
Specific Gravity, Urine: 1.027
pH: 5.5

## 2011-02-18 LAB — BLOOD GAS, ARTERIAL
Acid-base deficit: 3.7 — ABNORMAL HIGH
Bicarbonate: 20
FIO2: 0.4
O2 Saturation: 86.2
O2 Saturation: 99.1
Patient temperature: 98.6
TCO2: 20.9
pCO2 arterial: 36.7
pH, Arterial: 7.406
pO2, Arterial: 135 — ABNORMAL HIGH

## 2011-02-18 LAB — DIFFERENTIAL
Eosinophils Absolute: 0
Eosinophils Relative: 0
Lymphs Abs: 0.9

## 2011-02-18 LAB — COMPREHENSIVE METABOLIC PANEL
ALT: 27
AST: 39 — ABNORMAL HIGH
Alkaline Phosphatase: 77
BUN: 14
CO2: 24
Chloride: 101
Creatinine, Ser: 1.08
GFR calc Af Amer: >60
GFR calc non Af Amer: >60
GFR calc non Af Amer: >60
Glucose, Bld: 130 — ABNORMAL HIGH
Potassium: 3.7
Sodium: 135
Total Bilirubin: 0.4
Total Bilirubin: 0.6
Total Protein: 6.7

## 2011-02-18 LAB — CSF CELL COUNT WITH DIFFERENTIAL
RBC Count, CSF: 1 — ABNORMAL HIGH
Tube #: 1

## 2011-02-18 LAB — CSF CULTURE W GRAM STAIN: Culture: NO GROWTH

## 2011-02-18 LAB — CBC
HCT: 36.9 — ABNORMAL LOW
HCT: 38.7 — ABNORMAL LOW
MCV: 81.3
MCV: 82.1
Platelets: 163
Platelets: 175
WBC: 4.5
WBC: 8.1

## 2011-02-18 LAB — POCT CARDIAC MARKERS
CKMB, poc: 1.2
Myoglobin, poc: 153
Myoglobin, poc: 98.6
Operator id: 257131
Operator id: 270111
Troponin i, poc: <0.05
Troponin i, poc: <0.05

## 2011-02-18 LAB — POCT I-STAT 3, ART BLOOD GAS (G3+): O2 Saturation: 93

## 2011-02-18 LAB — PROTEIN AND GLUCOSE, CSF
Glucose, CSF: 82 — ABNORMAL HIGH
Total  Protein, CSF: 48 — ABNORMAL HIGH

## 2011-02-18 LAB — CULTURE, BLOOD (ROUTINE X 2)
Culture: NO GROWTH
Culture: NO GROWTH

## 2011-02-18 LAB — B-NATRIURETIC PEPTIDE (CONVERTED LAB): Pro B Natriuretic peptide (BNP): 66

## 2011-02-18 LAB — URINE CULTURE

## 2011-02-18 LAB — LIPID PANEL
Cholesterol: 143
Triglycerides: 103

## 2011-02-18 LAB — CK TOTAL AND CKMB (NOT AT ARMC)
CK, MB: 1.8
Total CK: 202

## 2011-02-18 LAB — TROPONIN I: Troponin I: 0.02

## 2011-02-18 LAB — D-DIMER, QUANTITATIVE: D-Dimer, Quant: 1.76 — ABNORMAL HIGH

## 2011-02-19 ENCOUNTER — Ambulatory Visit: Payer: Medicare Other | Admitting: Internal Medicine

## 2011-02-20 ENCOUNTER — Ambulatory Visit (INDEPENDENT_AMBULATORY_CARE_PROVIDER_SITE_OTHER)
Admission: RE | Admit: 2011-02-20 | Discharge: 2011-02-20 | Disposition: A | Discharge status: Home / Self Care | Payer: Medicare Other | Source: Ambulatory Visit | Attending: Internal Medicine | Admitting: Internal Medicine

## 2011-02-20 ENCOUNTER — Ambulatory Visit (INDEPENDENT_AMBULATORY_CARE_PROVIDER_SITE_OTHER): Payer: Medicare Other | Admitting: Internal Medicine

## 2011-02-20 ENCOUNTER — Encounter: Payer: Self-pay | Admitting: Internal Medicine

## 2011-02-20 VITALS — BP 130/70 | HR 67 | Temp 98.8°F | Resp 16 | Wt 250.0 lb

## 2011-02-20 DIAGNOSIS — M542 Cervicalgia: Secondary | ICD-10-CM

## 2011-02-20 DIAGNOSIS — M25519 Pain in unspecified shoulder: Secondary | ICD-10-CM

## 2011-02-20 DIAGNOSIS — M25512 Pain in left shoulder: Secondary | ICD-10-CM

## 2011-02-20 DIAGNOSIS — R202 Paresthesia of skin: Secondary | ICD-10-CM | POA: Insufficient documentation

## 2011-02-20 DIAGNOSIS — R209 Unspecified disturbances of skin sensation: Secondary | ICD-10-CM

## 2011-02-20 NOTE — Assessment & Plan Note (Signed)
I will check a plain xray to look for ddd, fracture, etc

## 2011-02-20 NOTE — Assessment & Plan Note (Signed)
i will check a plain xray to look for djd, spur, fracure

## 2011-02-20 NOTE — Assessment & Plan Note (Signed)
I have asked him to get a NCS/EMG done to see where these symptoms are coming from

## 2011-02-20 NOTE — Progress Notes (Signed)
Subjective:    Patient ID: [name], male    DOB: 1935-03-24, 76 y.o.   MRN: [id number]  HPI He returns c/o several month history of worsening pain in neck and left shoulder with numbness and tingling in his left hand.  Review of Systems  Constitutional: Negative for fever, chills, diaphoresis, activity change, appetite change, fatigue and unexpected weight change.  HENT: Positive for neck pain and neck stiffness. Negative for hearing loss, sore throat, facial swelling, trouble swallowing, voice change, sinus pressure, tinnitus and ear discharge.   Eyes: Negative.   Respiratory: Negative for apnea, cough, choking, chest tightness, shortness of breath, wheezing and stridor.   Cardiovascular: Negative for chest pain, palpitations and leg swelling.  Genitourinary: Negative.   Musculoskeletal: Negative for myalgias, back pain, joint swelling, arthralgias and gait problem.  Skin: Negative for color change, pallor, rash and wound.  Neurological: Positive for numbness. Negative for dizziness, tremors, seizures, syncope, facial asymmetry, speech difficulty, weakness, light-headedness and headaches.  Hematological: Negative for adenopathy. Does not bruise/bleed easily.  Psychiatric/Behavioral: Negative.        Objective:   Physical Exam  Vitals reviewed. Constitutional: He is oriented to person, place, and time. He appears well-developed and well-nourished. No distress.  HENT:  Head: Normocephalic and atraumatic.  Mouth/Throat: Oropharynx is clear and moist. No oropharyngeal exudate.  Eyes: Conjunctivae are normal. Right eye exhibits no discharge. Left eye exhibits no discharge. No scleral icterus.  Neck: Normal range of motion. Neck supple. No JVD present. No tracheal deviation present. No thyromegaly present.  Cardiovascular: Normal rate, regular rhythm, normal heart sounds and intact distal pulses.  Exam reveals no gallop and no friction rub.   No murmur heard. Pulmonary/Chest:  Effort normal and breath sounds normal. No stridor. No respiratory distress. He has no wheezes. He has no rales. He exhibits no tenderness.  Abdominal: Soft. Bowel sounds are normal. He exhibits no distension and no mass. There is no tenderness. There is no rebound and no guarding.  Musculoskeletal: Normal range of motion. He exhibits no edema and no tenderness.       Left shoulder: Normal. He exhibits normal range of motion, no tenderness, no bony tenderness, no swelling, no effusion, no crepitus, no deformity, no pain and normal strength.       Cervical back: Normal. He exhibits normal range of motion, no tenderness, no bony tenderness, no swelling, no edema, no deformity, no laceration, no pain, no spasm and normal pulse.  Lymphadenopathy:    He has no cervical adenopathy.  Neurological: He is alert and oriented to person, place, and time. He displays no atrophy, no tremor and normal reflexes. No cranial nerve deficit or sensory deficit. He exhibits abnormal muscle tone (mild weakness in his left hand). He displays a negative Romberg sign. He displays no seizure activity. Coordination and gait normal. He displays no Babinski's sign on the right side. He displays no Babinski's sign on the left side.  Reflex Scores:      Tricep reflexes are 1+ on the right side and 1+ on the left side.      Bicep reflexes are 1+ on the right side and 1+ on the left side.      Brachioradialis reflexes are 1+ on the right side and 1+ on the left side.      Patellar reflexes are 1+ on the right side and 1+ on the left side.      Achilles reflexes are 1+ on the right side and 1+ on  the left side. Skin: Skin is warm and dry. No rash noted. He is not diaphoretic. No erythema. No pallor.  Psychiatric: He has a normal mood and affect. His behavior is normal. Judgment and thought content normal.      Lab Results  Component Value Date   WBC 4.0* 05/07/2010   HGB 13.0 05/07/2010   HCT 38.6* 05/07/2010   PLT 206.0 05/07/2010     GLUCOSE 110* 10/18/2010   CHOL 166 05/07/2010   TRIG 102.0 05/07/2010   HDL 38.30* 05/07/2010   LDLCALC 107* 05/07/2010   ALT 25 10/18/2010   AST 30 10/18/2010   NA 140 10/18/2010   K 3.6 10/18/2010   CL 106 10/18/2010   CREATININE 0.9 10/18/2010   BUN 14 10/18/2010   CO2 28 10/18/2010   TSH 1.71 05/07/2010   PSA 0.00* 05/07/2010   INR 1.0 07/24/2008   HGBA1C 7.1* 10/18/2010   MICROALBUR 2.9* 05/07/2010      Assessment & Plan:

## 2011-03-17 ENCOUNTER — Encounter: Payer: Self-pay | Admitting: Internal Medicine

## 2011-03-19 ENCOUNTER — Ambulatory Visit (INDEPENDENT_AMBULATORY_CARE_PROVIDER_SITE_OTHER): Payer: Medicare Other | Admitting: Internal Medicine

## 2011-03-19 ENCOUNTER — Encounter: Payer: Self-pay | Admitting: Internal Medicine

## 2011-03-19 VITALS — BP 140/80 | HR 56 | Temp 98.7°F | Resp 16

## 2011-03-19 DIAGNOSIS — M25519 Pain in unspecified shoulder: Secondary | ICD-10-CM

## 2011-03-19 DIAGNOSIS — I10 Essential (primary) hypertension: Secondary | ICD-10-CM

## 2011-03-19 DIAGNOSIS — R209 Unspecified disturbances of skin sensation: Secondary | ICD-10-CM

## 2011-03-19 DIAGNOSIS — M5412 Radiculopathy, cervical region: Secondary | ICD-10-CM

## 2011-03-19 DIAGNOSIS — R202 Paresthesia of skin: Secondary | ICD-10-CM

## 2011-03-19 DIAGNOSIS — M25512 Pain in left shoulder: Secondary | ICD-10-CM

## 2011-03-19 NOTE — Assessment & Plan Note (Signed)
Plain films showed some DJD in the [facility] joint

## 2011-03-19 NOTE — Progress Notes (Signed)
Subjective:    Patient ID: [name], male    DOB: Oct 01, 1934, 76 y.o.   MRN: [id number]  Neck Pain  This is a chronic problem. The current episode started more than 1 month ago. The problem occurs intermittently. The problem has been unchanged. The pain is associated with nothing. The pain is present in the midline. The quality of the pain is described as burning. The pain is at a severity of 2/10. The pain is mild. The symptoms are aggravated by nothing. The pain is same all the time. Associated symptoms include numbness (in left arm) and tingling (in left arm). Pertinent negatives include no chest pain, fever, headaches, leg pain, pain with swallowing, paresis, photophobia, syncope, trouble swallowing, visual change, weakness or weight loss. He has tried nothing for the symptoms.      Review of Systems  Constitutional: Negative for fever, chills, weight loss, diaphoresis, activity change, appetite change, fatigue and unexpected weight change.  HENT: Positive for neck pain. Negative for facial swelling and trouble swallowing.   Eyes: Negative for photophobia, pain, discharge, redness, itching and visual disturbance.  Respiratory: Negative.   Cardiovascular: Negative for chest pain, palpitations, leg swelling and syncope.  Gastrointestinal: Negative for nausea, vomiting, abdominal pain, diarrhea, constipation and blood in stool.  Genitourinary: Negative for dysuria, urgency, frequency, hematuria, flank pain, decreased urine volume, enuresis and difficulty urinating.  Musculoskeletal: Positive for arthralgias (left shoulder). Negative for myalgias, back pain, joint swelling and gait problem.  Skin: Negative for color change, pallor, rash and wound.  Neurological: Positive for tingling (in left arm) and numbness (in left arm). Negative for dizziness, tremors, seizures, syncope, facial asymmetry, speech difficulty, weakness, light-headedness and headaches.  Hematological: Negative for  adenopathy. Does not bruise/bleed easily.  Psychiatric/Behavioral: Negative.        Objective:   Physical Exam  Vitals reviewed. Constitutional: He is oriented to person, place, and time. He appears well-developed and well-nourished. No distress.  HENT:  Head: Normocephalic and atraumatic.  Mouth/Throat: Oropharynx is clear and moist. No oropharyngeal exudate.  Eyes: Conjunctivae are normal. Right eye exhibits no discharge. Left eye exhibits no discharge. No scleral icterus.  Neck: Normal range of motion. Neck supple. No JVD present. No tracheal deviation present. No thyromegaly present.  Cardiovascular: Normal rate, regular rhythm, normal heart sounds and intact distal pulses.  Exam reveals no gallop and no friction rub.   No murmur heard. Pulmonary/Chest: Effort normal and breath sounds normal. No stridor. No respiratory distress. He has no wheezes. He has no rales. He exhibits no tenderness.  Abdominal: Soft. Bowel sounds are normal. He exhibits no distension and no mass. There is no tenderness. There is no rebound and no guarding.  Musculoskeletal: Normal range of motion. He exhibits no edema and no tenderness.       Cervical back: Normal. He exhibits normal range of motion, no tenderness, no bony tenderness, no swelling, no edema, no deformity, no laceration, no pain, no spasm and normal pulse.  Lymphadenopathy:    He has no cervical adenopathy.  Neurological: He is alert and oriented to person, place, and time. He has normal strength. He displays no atrophy, no tremor and normal reflexes. No cranial nerve deficit or sensory deficit. He exhibits normal muscle tone. He displays a negative Romberg sign. He displays no seizure activity. Coordination and gait normal. He displays no Babinski's sign on the right side. He displays no Babinski's sign on the left side.  Reflex Scores:      Tricep  reflexes are 1+ on the right side and 1+ on the left side.      Bicep reflexes are 1+ on the right  side and 1+ on the left side.      Brachioradialis reflexes are 1+ on the right side and 1+ on the left side.      Patellar reflexes are 1+ on the right side and 1+ on the left side.      Achilles reflexes are 1+ on the right side and 1+ on the left side. Skin: Skin is warm and dry. No rash noted. He is not diaphoretic. No erythema. No pallor.  Psychiatric: He has a normal mood and affect. His behavior is normal. Judgment and thought content normal.      Lab Results  Component Value Date   WBC 4.0* 05/07/2010   HGB 13.0 05/07/2010   HCT 38.6* 05/07/2010   PLT 206.0 05/07/2010   GLUCOSE 110* 10/18/2010   CHOL 166 05/07/2010   TRIG 102.0 05/07/2010   HDL 38.30* 05/07/2010   LDLCALC 107* 05/07/2010   ALT 25 10/18/2010   AST 30 10/18/2010   NA 140 10/18/2010   K 3.6 10/18/2010   CL 106 10/18/2010   CREATININE 0.9 10/18/2010   BUN 14 10/18/2010   CO2 28 10/18/2010   TSH 1.71 05/07/2010   PSA 0.00* 05/07/2010   INR 1.0 07/24/2008   HGBA1C 7.1* 10/18/2010   MICROALBUR 2.9* 05/07/2010      Assessment & Plan:

## 2011-03-19 NOTE — Assessment & Plan Note (Signed)
Check MRI of the cervical spine

## 2011-03-19 NOTE — Assessment & Plan Note (Signed)
His BP is well controlled 

## 2011-03-19 NOTE — Assessment & Plan Note (Signed)
The plain films showed significant changes in his neck so I will proceed with an MRI to look for spinal stenosis, nerve impingement, etc

## 2011-03-25 ENCOUNTER — Ambulatory Visit
Admission: RE | Admit: 2011-03-25 | Discharge: 2011-03-25 | Disposition: A | Discharge status: Home / Self Care | Payer: Medicare Other | Source: Ambulatory Visit | Attending: Internal Medicine | Admitting: Internal Medicine

## 2011-03-25 DIAGNOSIS — M5412 Radiculopathy, cervical region: Secondary | ICD-10-CM

## 2011-03-30 ENCOUNTER — Encounter: Payer: Self-pay | Admitting: Internal Medicine

## 2011-04-15 ENCOUNTER — Ambulatory Visit (INDEPENDENT_AMBULATORY_CARE_PROVIDER_SITE_OTHER): Payer: Medicare Other | Admitting: Internal Medicine

## 2011-04-15 ENCOUNTER — Encounter: Payer: Self-pay | Admitting: Internal Medicine

## 2011-04-15 ENCOUNTER — Other Ambulatory Visit (INDEPENDENT_AMBULATORY_CARE_PROVIDER_SITE_OTHER): Payer: Medicare Other

## 2011-04-15 VITALS — BP 140/78 | HR 76 | Temp 98.8°F | Resp 16 | Wt 251.0 lb

## 2011-04-15 DIAGNOSIS — Z79899 Other long term (current) drug therapy: Secondary | ICD-10-CM

## 2011-04-15 DIAGNOSIS — Z23 Encounter for immunization: Secondary | ICD-10-CM

## 2011-04-15 DIAGNOSIS — I10 Essential (primary) hypertension: Secondary | ICD-10-CM

## 2011-04-15 DIAGNOSIS — E119 Type 2 diabetes mellitus without complications: Secondary | ICD-10-CM

## 2011-04-15 DIAGNOSIS — E785 Hyperlipidemia, unspecified: Secondary | ICD-10-CM

## 2011-04-15 DIAGNOSIS — D649 Anemia, unspecified: Secondary | ICD-10-CM

## 2011-04-15 DIAGNOSIS — D510 Vitamin B12 deficiency anemia due to intrinsic factor deficiency: Secondary | ICD-10-CM | POA: Insufficient documentation

## 2011-04-15 DIAGNOSIS — M5412 Radiculopathy, cervical region: Secondary | ICD-10-CM

## 2011-04-15 LAB — URINALYSIS, ROUTINE W REFLEX MICROSCOPIC
Bilirubin Urine: NEGATIVE
Hgb urine dipstick: NEGATIVE
Ketones, ur: NEGATIVE
Nitrite: NEGATIVE
Total Protein, Urine: NEGATIVE
Urine Glucose: NEGATIVE

## 2011-04-15 LAB — CBC WITH DIFFERENTIAL/PLATELET
Basophils Absolute: 0 10*3/uL (ref 0.0–0.1)
Basophils Relative: 0.4 % (ref 0.0–3.0)
Hemoglobin: 12.8 g/dL — ABNORMAL LOW (ref 13.0–17.0)
Lymphocytes Relative: 34.7 % (ref 12.0–46.0)
Monocytes Relative: 11.3 % (ref 3.0–12.0)
Neutro Abs: 2.3 10*3/uL (ref 1.4–7.7)
RBC: 4.4 Mil/uL (ref 4.22–5.81)
RDW: 14.6 % (ref 11.5–14.6)

## 2011-04-15 LAB — COMPREHENSIVE METABOLIC PANEL
AST: 30 U/L (ref 0–37)
Albumin: 3.8 g/dL (ref 3.5–5.2)
BUN: 15 mg/dL (ref 6–23)
Calcium: 9 mg/dL (ref 8.4–10.5)
Chloride: 107 mEq/L (ref 96–112)
Potassium: 4.1 mEq/L (ref 3.5–5.1)
Sodium: 141 mEq/L (ref 135–145)
Total Protein: 6.7 g/dL (ref 6.0–8.3)

## 2011-04-15 LAB — IBC PANEL
Iron: 65 ug/dL (ref 42–165)
Saturation Ratios: 21.5 % (ref 20.0–50.0)
Transferrin: 215.8 mg/dL (ref 212.0–360.0)

## 2011-04-15 NOTE — Patient Instructions (Addendum)
Diabetes, Type 2 Diabetes is a long-lasting (chronic) disease. In type 2 diabetes, the pancreas does not make enough insulin (a hormone), and the body does not respond normally to the insulin that is made. This type of diabetes was also previously called adult-onset diabetes. It usually occurs after the age of 55, but it can occur at any age.  CAUSES  Type 2 diabetes happens because the pancreasis not making enough insulin or your body has trouble using the insulin that your pancreas does make properly. SYMPTOMS   Drinking more than usual.   Urinating more than usual.   Blurred vision.   Dry, itchy skin.   Frequent infections.   Feeling more tired than usual (fatigue).  DIAGNOSIS The diagnosis of type 2 diabetes is usually made by one of the following tests:  Fasting blood glucose test. You will not eat for at least 8 hours and then take a blood test.   Random blood glucose test. Your blood glucose (sugar) is checked at any time of the day regardless of when you ate.   Oral glucose tolerance test (OGTT). Your blood glucose is measured after you have not eaten (fasted) and then after you drink a glucose containing beverage.  TREATMENT   Healthy eating.   Exercise.   Medicine, if needed.   Monitoring blood glucose.   Seeing your caregiver regularly.  HOME CARE INSTRUCTIONS   Check your blood glucose at least once a day. More frequent monitoring may be necessary, depending on your medicines and on how well your diabetes is controlled. Your caregiver will advise you.   Take your medicine as directed by your caregiver.   Do not smoke.   Make wise food choices. Ask your caregiver for information. Weight loss can improve your diabetes.   Learn about low blood glucose (hypoglycemia) and how to treat it.   Get your eyes checked regularly.   Have a yearly physical exam. Have your blood pressure checked and your blood and urine tested.   Wear a pendant or bracelet saying  that you have diabetes.   Check your feet every night for cuts, sores, blisters, and redness. Let your caregiver know if you have any problems.  SEEK MEDICAL CARE IF:   You have problems keeping your blood glucose in target range.   You have problems with your medicines.   You have symptoms of an illness that do not improve after 24 hours.   You have a sore or wound that is not healing.   You notice a change in vision or a new problem with your vision.   You have a fever.  MAKE SURE YOU:  Understand these instructions.   Will watch your condition.   Will get help right away if you are not doing well or get worse.  Document Released: 04/21/2005 Document Revised: 01/02/2011 Document Reviewed: 10/07/2010 [facility] Patient Information 2012 [place], [place].Degenerative Disc Disease Degenerative disc disease is a condition caused by the changes that occur in the cushions of the backbone (spinal discs) as you grow older. Spinal discs are soft and compressible discs located between the bones of the spine (vertebrae). They act like shock absorbers. Degenerative disc disease can affect the wholespine. However, the neck and lower back are most commonly affected. Many changes can occur in the spinal discs with aging, such as:  The spinal discs may dry and shrink.   Small tears may occur in the tough, outer covering of the disc (annulus).   The disc space may  become smaller due to loss of water.   Abnormal growths in the bone (spurs) may occur. This can put pressure on the nerve roots exiting the spinal canal, causing pain.   The spinal canal may become narrowed.  CAUSES  Degenerative disc disease is a condition caused by the changes that occur in the spinal discs with aging. The exact cause is not known, but there is a genetic basis for many patients. Degenerative changes can occur due to loss of fluid in the disc. This makes the disc thinner and reduces the space between the backbones. Small  cracks can develop in the outer layer of the disc. This can lead to the breakdown of the disc. You are more likely to get degenerative disc disease if you are overweight. Smoking cigarettes and doing heavy work such as weightlifting can also increase your risk of this condition. Degenerative changes can start after a sudden injury. Growth of bone spurs can compress the nerve roots and cause pain.  SYMPTOMS  The symptoms vary from person to person. Some people may have no pain, while others have severe pain. The pain may be so severe that it can limit your activities. The location of the pain depends on the part of your backbone that is affected. You will have neck or arm pain if a disc in the neck area is affected. You will have pain in your back, buttocks, or legs if a disc in the lower back is affected. The pain becomes worse while bending, reaching up, or with twisting movements. The pain may start gradually and then get worse as time passes. It may also start after a major or minor injury. You may feel numbness or tingling in the arms or legs.  DIAGNOSIS  Your caregiver will ask you about your symptoms and about activities or habits that may cause the pain. He or she may also ask about any injuries, diseases, ortreatments you have had earlier. Your caregiver will examine you to check for the range of movement that is possible in the affected area, to check for strength in your extremities, and to check for sensation in the areas of the arms and legs supplied by different nerve roots. An X-ray of the spine may be taken. Your caregiver may suggest other imaging tests, such as a computerized magnetic scan (MRI), if needed.  TREATMENT  Treatment includes rest, modifying your activities, and applying ice and heat. Your caregiver may prescribe medicines to reduce your pain and may ask you to do some exercises to strengthen your back. In some cases, you may need surgery. You and your caregiver will decide on the  treatment that is best for you. HOME CARE INSTRUCTIONS   Follow proper lifting and walking techniques as advised by your caregiver.   Maintain good posture.   Exercise regularly as advised.   Perform relaxation exercises.   Change your sitting, standing, and sleeping habits as advised. Change positions frequently.   Lose weight as advised.   Stop smoking if you smoke.   Wear supportive footwear.  SEEK MEDICAL CARE IF:  The pain does not go away within 1 to 4 weeks. SEEK IMMEDIATE MEDICAL CARE IF:   The pain is severe.   You notice weakness in your arms, hands, or legs.   You begin to lose control of your bladder or bowel.  MAKE SURE YOU:   Understand these instructions.   Will watch your condition.   Will get help right away if you are not  doing well or get worse.  Document Released: 02/16/2007 Document Revised: 01/01/2011 Document Reviewed: 02/16/2007 [facility] Patient Information 2012 [place], [place].

## 2011-04-15 NOTE — Progress Notes (Signed)
Subjective:    Patient ID: [name], male    DOB: [birth year], 76 y.o.   MRN: [id number]  HPI He returns for f/up and to discuss his recent MRI results that showed significant C-spine disease with bulging discs, foraminal stenosis, some impingement, and osteophytes. Today he tells me that the pain is better and the numbness and tingling in his left hand has improved. He does not wish to pursue any further treatment at this time.   Review of Systems  Constitutional: Negative for fever, chills, diaphoresis, activity change, appetite change, fatigue and unexpected weight change.  HENT: Positive for neck pain and neck stiffness. Negative for sore throat, facial swelling, trouble swallowing and voice change.   Eyes: Negative.   Respiratory: Negative for cough, chest tightness, shortness of breath, wheezing and stridor.   Cardiovascular: Negative for chest pain, palpitations and leg swelling.  Gastrointestinal: Negative for nausea, vomiting, abdominal pain, diarrhea, constipation and blood in stool.  Genitourinary: Negative for dysuria, urgency, frequency, hematuria, flank pain, decreased urine volume, enuresis and difficulty urinating.  Musculoskeletal: Negative for myalgias, back pain, joint swelling, arthralgias and gait problem.  Skin: Negative for color change, pallor, rash and wound.  Neurological: Positive for numbness. Negative for dizziness, tremors, seizures, syncope, facial asymmetry, speech difficulty, weakness, light-headedness and headaches.  Hematological: Negative for adenopathy. Does not bruise/bleed easily.  Psychiatric/Behavioral: Negative.        Objective:   Physical Exam  Vitals reviewed. Constitutional: He is oriented to person, place, and time. He appears well-developed and well-nourished. No distress.  HENT:  Head: Normocephalic and atraumatic.  Mouth/Throat: Oropharynx is clear and moist. No oropharyngeal exudate.  Eyes: Conjunctivae are normal. Right eye  exhibits no discharge. Left eye exhibits no discharge. No scleral icterus.  Neck: Normal range of motion. Neck supple. No JVD present. No tracheal deviation present. No thyromegaly present.  Cardiovascular: Normal rate, regular rhythm, normal heart sounds and intact distal pulses.  Exam reveals no gallop and no friction rub.   No murmur heard. Pulmonary/Chest: Effort normal and breath sounds normal. No stridor. No respiratory distress. He has no wheezes. He has no rales. He exhibits no tenderness.  Abdominal: Soft. Bowel sounds are normal. He exhibits no distension and no mass. There is no tenderness. There is no rebound and no guarding.  Musculoskeletal: Normal range of motion. He exhibits no edema and no tenderness.       Cervical back: Normal. He exhibits normal range of motion, no tenderness, no bony tenderness, no swelling, no edema, no deformity, no laceration, no pain, no spasm and normal pulse.  Lymphadenopathy:    He has no cervical adenopathy.  Neurological: He is alert and oriented to person, place, and time. He has normal strength. He displays no atrophy, no tremor and normal reflexes. No cranial nerve deficit or sensory deficit. He exhibits normal muscle tone. He displays a negative Romberg sign. He displays no seizure activity. Coordination and gait normal. He displays no Babinski's sign on the right side. He displays no Babinski's sign on the left side.  Reflex Scores:      Tricep reflexes are 1+ on the right side and 1+ on the left side.      Bicep reflexes are 1+ on the right side and 1+ on the left side.      Brachioradialis reflexes are 1+ on the right side and 1+ on the left side.      Patellar reflexes are 1+ on the right side and 1+ on  the left side.      Achilles reflexes are 1+ on the right side and 1+ on the left side. Skin: Skin is warm and dry. No rash noted. He is not diaphoretic. No erythema. No pallor.  Psychiatric: He has a normal mood and affect. His behavior is  normal. Judgment and thought content normal.     Lab Results  Component Value Date   WBC 4.0* 05/07/2010   HGB 13.0 05/07/2010   HCT 38.6* 05/07/2010   PLT 206.0 05/07/2010   GLUCOSE 110* 10/18/2010   CHOL 166 05/07/2010   TRIG 102.0 05/07/2010   HDL 38.30* 05/07/2010   LDLCALC 107* 05/07/2010   ALT 25 10/18/2010   AST 30 10/18/2010   NA 140 10/18/2010   K 3.6 10/18/2010   CL 106 10/18/2010   CREATININE 0.9 10/18/2010   BUN 14 10/18/2010   CO2 28 10/18/2010   TSH 1.71 05/07/2010   PSA 0.00* 05/07/2010   INR 1.0 07/24/2008   HGBA1C 7.1* 10/18/2010   MICROALBUR 2.9* 05/07/2010       Assessment & Plan:

## 2011-04-18 NOTE — Assessment & Plan Note (Signed)
He is due for an a1c test today and I will monitor his renal function

## 2011-04-18 NOTE — Assessment & Plan Note (Signed)
I will recommend further treatment options when he is ready

## 2011-04-18 NOTE — Assessment & Plan Note (Signed)
I will recheck his CBC and will look at his vitamin levels today

## 2011-04-18 NOTE — Assessment & Plan Note (Signed)
His BP is well controlled, I will check his lytes and renal function today 

## 2011-04-22 ENCOUNTER — Ambulatory Visit: Payer: Medicare Other | Admitting: Internal Medicine

## 2011-05-02 ENCOUNTER — Other Ambulatory Visit: Payer: Self-pay | Admitting: Internal Medicine

## 2011-06-04 LAB — HM DIABETES EYE EXAM: HM Diabetic Eye Exam: NORMAL

## 2011-06-13 ENCOUNTER — Ambulatory Visit (INDEPENDENT_AMBULATORY_CARE_PROVIDER_SITE_OTHER): Payer: Medicare Other | Admitting: Internal Medicine

## 2011-06-13 ENCOUNTER — Ambulatory Visit (INDEPENDENT_AMBULATORY_CARE_PROVIDER_SITE_OTHER)
Admission: RE | Admit: 2011-06-13 | Discharge: 2011-06-13 | Disposition: A | Discharge status: Home / Self Care | Payer: Medicare Other | Source: Ambulatory Visit | Attending: Internal Medicine | Admitting: Internal Medicine

## 2011-06-13 ENCOUNTER — Encounter: Payer: Self-pay | Admitting: Internal Medicine

## 2011-06-13 VITALS — BP 144/68 | HR 56 | Temp 98.0°F | Resp 16 | Wt 253.0 lb

## 2011-06-13 DIAGNOSIS — J209 Acute bronchitis, unspecified: Secondary | ICD-10-CM

## 2011-06-13 DIAGNOSIS — J449 Chronic obstructive pulmonary disease, unspecified: Secondary | ICD-10-CM

## 2011-06-13 DIAGNOSIS — R059 Cough, unspecified: Secondary | ICD-10-CM

## 2011-06-13 DIAGNOSIS — R05 Cough: Secondary | ICD-10-CM

## 2011-06-13 MED ORDER — AMOXICILLIN-POT CLAVULANATE 875-125 MG PO TABS: 1.0000 | ORAL_TABLET | Freq: Two times a day (BID) | ORAL | Status: AC

## 2011-06-13 MED ORDER — PROMETHAZINE-DM 6.25-15 MG/5ML PO SYRP: 5.0000 mL | ORAL_SOLUTION | Freq: Four times a day (QID) | ORAL | Status: AC | PRN

## 2011-06-13 NOTE — Assessment & Plan Note (Signed)
I will check a CXR to look for pna, mass, edema, etc. 

## 2011-06-13 NOTE — Patient Instructions (Signed)

## 2011-06-13 NOTE — Progress Notes (Signed)
Subjective:    Patient ID: [name], male    DOB: [birth year], 77 y.o.   MRN: [id number]  Cough This is a new problem. The current episode started 1 to 4 weeks ago. The problem has been gradually worsening. The problem occurs every few hours. The cough is productive of purulent sputum. Associated symptoms include chills, nasal congestion, postnasal drip, rhinorrhea and a sore throat. Pertinent negatives include no chest pain, ear congestion, ear pain, fever, headaches, heartburn, hemoptysis, myalgias, rash, shortness of breath, sweats, weight loss or wheezing. The symptoms are aggravated by nothing. He has tried OTC cough suppressant for the symptoms. The treatment provided moderate relief. His past medical history is significant for COPD and pneumonia.      Review of Systems  Constitutional: Positive for chills. Negative for fever and weight loss.  HENT: Positive for sore throat, rhinorrhea and postnasal drip. Negative for ear pain.   Eyes: Negative.   Respiratory: Positive for cough. Negative for hemoptysis, shortness of breath and wheezing.   Cardiovascular: Negative.  Negative for chest pain.  Gastrointestinal: Negative.  Negative for heartburn.  Genitourinary: Negative.   Musculoskeletal: Negative.  Negative for myalgias.  Skin: Negative.  Negative for rash.  Neurological: Negative.  Negative for headaches.  Hematological: Negative.   Psychiatric/Behavioral: Negative.        Objective:   Physical Exam  Vitals reviewed. Constitutional: He is oriented to person, place, and time. He appears well-developed and well-nourished. No distress.  HENT:  Head: No trismus in the jaw.  Right Ear: Hearing, tympanic membrane, external ear and ear canal normal.  Left Ear: Hearing, tympanic membrane, external ear and ear canal normal.  Nose: Mucosal edema and rhinorrhea present. No nose lacerations, sinus tenderness, nasal deformity, septal deviation or nasal septal hematoma. No  epistaxis.  No foreign bodies. Right sinus exhibits no maxillary sinus tenderness and no frontal sinus tenderness. Left sinus exhibits no maxillary sinus tenderness and no frontal sinus tenderness.  Mouth/Throat: Oropharynx is clear and moist and mucous membranes are normal. Mucous membranes are not pale, not dry and not cyanotic. No uvula swelling. No oropharyngeal exudate, posterior oropharyngeal edema, posterior oropharyngeal erythema or tonsillar abscesses.  Eyes: Conjunctivae are normal. Right eye exhibits no discharge. Left eye exhibits no discharge. No scleral icterus.  Neck: Normal range of motion. Neck supple. No JVD present. No tracheal deviation present. No thyromegaly present.  Cardiovascular: Normal rate, regular rhythm, normal heart sounds and intact distal pulses.  Exam reveals no gallop and no friction rub.   No murmur heard. Pulmonary/Chest: Effort normal and breath sounds normal. No respiratory distress. He has no wheezes. He has no rales. He exhibits no tenderness.  Abdominal: Soft. Bowel sounds are normal. He exhibits no distension and no mass. There is no tenderness. There is no rebound and no guarding.  Musculoskeletal: Normal range of motion. He exhibits no edema and no tenderness.  Lymphadenopathy:    He has no cervical adenopathy.  Neurological: He is oriented to person, place, and time.  Skin: Skin is warm and dry. No rash noted. He is not diaphoretic. No erythema. No pallor.  Psychiatric: He has a normal mood and affect. His behavior is normal. Judgment and thought content normal.      Lab Results  Component Value Date   WBC 4.5 04/15/2011   HGB 12.8* 04/15/2011   HCT 38.3* 04/15/2011   PLT 222.0 04/15/2011   GLUCOSE 107* 04/15/2011   CHOL 166 05/07/2010   TRIG 102.0 05/07/2010  HDL 38.30* 05/07/2010   LDLCALC 107* 05/07/2010   ALT 25 04/15/2011   AST 30 04/15/2011   NA 141 04/15/2011   K 4.1 04/15/2011   CL 107 04/15/2011   CREATININE 1.1 04/15/2011   BUN 15  04/15/2011   CO2 28 04/15/2011   TSH 1.71 05/07/2010   PSA 0.00* 05/07/2010   INR 1.0 07/24/2008   HGBA1C 6.7* 04/15/2011   MICROALBUR 2.9* 05/07/2010      Assessment & Plan:

## 2011-06-13 NOTE — Assessment & Plan Note (Signed)
Start augmentin for the infection and a cough suppressant 

## 2011-06-13 NOTE — Assessment & Plan Note (Signed)
There is no evidence of exacerbation today, he will continue using dulera

## 2011-07-28 ENCOUNTER — Other Ambulatory Visit: Payer: Self-pay | Admitting: Internal Medicine

## 2012-01-14 ENCOUNTER — Other Ambulatory Visit: Payer: Self-pay | Admitting: Internal Medicine

## 2012-01-21 ENCOUNTER — Other Ambulatory Visit: Payer: Self-pay | Admitting: Internal Medicine

## 2012-02-25 ENCOUNTER — Encounter: Payer: Self-pay | Admitting: Internal Medicine

## 2012-02-25 ENCOUNTER — Ambulatory Visit (INDEPENDENT_AMBULATORY_CARE_PROVIDER_SITE_OTHER): Payer: Medicare Other | Admitting: Internal Medicine

## 2012-02-25 VITALS — BP 132/70 | HR 86 | Temp 98.5°F | Ht 74.0 in | Wt 252.2 lb

## 2012-02-25 DIAGNOSIS — K5289 Other specified noninfective gastroenteritis and colitis: Secondary | ICD-10-CM

## 2012-02-25 DIAGNOSIS — J4489 Other specified chronic obstructive pulmonary disease: Secondary | ICD-10-CM

## 2012-02-25 DIAGNOSIS — K529 Noninfective gastroenteritis and colitis, unspecified: Secondary | ICD-10-CM | POA: Insufficient documentation

## 2012-02-25 DIAGNOSIS — H6120 Impacted cerumen, unspecified ear: Secondary | ICD-10-CM

## 2012-02-25 DIAGNOSIS — E119 Type 2 diabetes mellitus without complications: Secondary | ICD-10-CM

## 2012-02-25 DIAGNOSIS — J449 Chronic obstructive pulmonary disease, unspecified: Secondary | ICD-10-CM

## 2012-02-25 MED ORDER — ONDANSETRON HCL 4 MG PO TABS: 4.0000 mg | ORAL_TABLET | Freq: Three times a day (TID) | ORAL | Status: DC | PRN

## 2012-02-25 MED ORDER — DIPHENOXYLATE-ATROPINE 2.5-0.025 MG PO TABS: 1.0000 | ORAL_TABLET | Freq: Four times a day (QID) | ORAL | Status: DC | PRN

## 2012-02-25 NOTE — Patient Instructions (Addendum)
Take all new medications as prescribed Continue all other medications as before Your ears were irrigated today of wax on both sides Please return for worsening fever, pain, diarrhea, blood, vomiting or other worsening symptoms

## 2012-02-29 ENCOUNTER — Encounter: Payer: Self-pay | Admitting: Internal Medicine

## 2012-02-29 DIAGNOSIS — H6120 Impacted cerumen, unspecified ear: Secondary | ICD-10-CM | POA: Insufficient documentation

## 2012-02-29 NOTE — Assessment & Plan Note (Signed)
Improved after irrigation 

## 2012-02-29 NOTE — Assessment & Plan Note (Signed)
stable overall by hx and exam, most recent data reviewed with pt, and pt to continue medical treatment as before SpO2 Readings from Last 3 Encounters:  02/25/12 96%  06/13/11 95%  03/19/11 98%

## 2012-02-29 NOTE — Assessment & Plan Note (Signed)
stable overall by hx and exam, most recent data reviewed with pt, and pt to continue medical treatment as before Lab Results  Component Value Date   HGBA1C 6.7* 04/15/2011

## 2012-02-29 NOTE — Assessment & Plan Note (Signed)
Doubt food poisoning, likely onset viral AGE, exam bening, d/w pt, for nausea/diarrheal control,  to f/u any worsening symptoms or concerns such as blood or other, pt delcines labs at this time

## 2012-02-29 NOTE — Progress Notes (Signed)
Subjective:    Patient ID: [name], male    DOB: 1935/01/12, 77 y.o.   MRN: [id number]  HPI  Here after awoke this am with acute onset n/v, bloating abdominal, diarrheal watery stool x 3, after some cramping overnight; abd sore now after vomiiting;  Also with general weakness and malaise, no chills, blood, rash, joint swelling.  No Fever, sick contacts, wife has similar food last PM.  Has been active at church shaking hands recently.  Pt denies chest pain, increased sob or doe, wheezing, orthopnea, PND, increased LE swelling, palpitations, dizziness or syncope.   Pt denies polydipsia, polyuria.  Also recently with bilat hearing loss with wax today Past Medical History  Diagnosis Date  . Asthma   . COPD (chronic obstructive pulmonary disease)     FEV1 2.68/72%, FEV1/FVC 0.63 (08/22/04  . Colon polyps   . Hyperlipidemia   . HTN (hypertension)   . Prostate cancer 2010   Past Surgical History  Procedure Date  . Prostatectomy 2010    robot, laparoscopic for prostate cancer    reports that he has quit smoking. He does not have any smokeless tobacco history on file. He reports that he does not drink alcohol or use illicit drugs. family history includes Arthritis in his other; Cancer in his father; Colon cancer in his father; Esophagitis in his mother; and Prostate cancer in his father. Allergies  Allergen Reactions  . Ace Inhibitors     REACTION: cough   Current Outpatient Prescriptions on File Prior to Visit  Medication Sig Dispense Refill  . aspirin 81 MG EC tablet Take 81 mg by mouth daily.        Marland Kitchen atorvastatin (LIPITOR) 80 MG tablet TAKE 1 TABLET BY MOUTH EVERY DAY  90 tablet  2  . AZOR 10-40 MG per tablet TAKE 1 TABLET BY MOUTH EVERY DAY  90 tablet  0  . AZOR 10-40 MG per tablet TAKE 1 TABLET BY MOUTH EVERY DAY  90 each  3  . DULERA 100-5 MCG/ACT AERO INHALE 2 PUFFS INTO THE LUNGS BY MOUTH TWICE DAILY  39 g  2  . folic acid (FOLVITE) 400 MCG tablet Take 400 mcg by mouth  every evening.        . Multiple Vitamins-Minerals (MULTIVITAMIN,TX-MINERALS) tablet Take 1 tablet by mouth daily.        . vitamin E 400 UNIT capsule Take 400 Units by mouth daily.           Review of Systems  Constitutional: Negative for diaphoresis and unexpected weight change.  HENT: Negative for tinnitus.   Eyes: Negative for photophobia and visual disturbance.  Respiratory: Negative for choking and stridor.   Gastrointestinal: Negative for vomiting and blood in stool.  Genitourinary: Negative for hematuria and decreased urine volume.  Musculoskeletal: Negative for gait problem.  Skin: Negative for color change and wound.  Neurological: Negative for tremors and numbness.  Psychiatric/Behavioral: Negative for decreased concentration. The patient is not hyperactive.       Objective:   Physical Exam BP 132/70  Pulse 86  Temp 98.5 F (36.9 C) (Oral)  Ht 6\' 2"  (1.88 m)  Wt 252 lb 4 oz (114.42 kg)  BMI 32.39 kg/m2  SpO2 96% Physical Exam  VS noted, mild ill Constitutional: Pt appears well-developed and well-nourished.  HENT: Head: Normocephalic.  Right Ear: External ear normal.  Left Ear: External ear normal.  Hearing improved after wax irrigation bilat Eyes: Conjunctivae and EOM are normal. Pupils are  equal, round, and reactive to light.  Neck: Normal range of motion. Neck supple.  Cardiovascular: Normal rate and regular rhythm.   Pulmonary/Chest: Effort normal and breath sounds normal.  Abd:  Soft, NT, non-distended, + BS  - benign exam Neurological: Pt is alert. Not confused  Skin: Skin is warm. No erythema.  Psychiatric: Pt behavior is normal. Thought content normal.      Assessment & Plan:

## 2012-04-19 ENCOUNTER — Other Ambulatory Visit: Payer: Self-pay | Admitting: Internal Medicine

## 2012-05-31 ENCOUNTER — Encounter: Payer: Self-pay | Admitting: Internal Medicine

## 2012-05-31 ENCOUNTER — Ambulatory Visit (INDEPENDENT_AMBULATORY_CARE_PROVIDER_SITE_OTHER): Payer: Medicare Other | Admitting: Internal Medicine

## 2012-05-31 ENCOUNTER — Other Ambulatory Visit (INDEPENDENT_AMBULATORY_CARE_PROVIDER_SITE_OTHER): Payer: Medicare Other

## 2012-05-31 VITALS — BP 144/86 | HR 68 | Temp 97.6°F | Resp 18 | Ht 73.5 in | Wt 247.1 lb

## 2012-05-31 DIAGNOSIS — Z8546 Personal history of malignant neoplasm of prostate: Secondary | ICD-10-CM

## 2012-05-31 DIAGNOSIS — Z0000 Encounter for general adult medical examination without abnormal findings: Secondary | ICD-10-CM

## 2012-05-31 DIAGNOSIS — J449 Chronic obstructive pulmonary disease, unspecified: Secondary | ICD-10-CM

## 2012-05-31 DIAGNOSIS — E785 Hyperlipidemia, unspecified: Secondary | ICD-10-CM

## 2012-05-31 DIAGNOSIS — I10 Essential (primary) hypertension: Secondary | ICD-10-CM

## 2012-05-31 DIAGNOSIS — E119 Type 2 diabetes mellitus without complications: Secondary | ICD-10-CM

## 2012-05-31 LAB — URINALYSIS, ROUTINE W REFLEX MICROSCOPIC
Bilirubin Urine: NEGATIVE
Ketones, ur: NEGATIVE
Leukocytes, UA: NEGATIVE
Specific Gravity, Urine: 1.02 (ref 1.000–1.030)
Urine Glucose: NEGATIVE
pH: 7 (ref 5.0–8.0)

## 2012-05-31 LAB — CBC WITH DIFFERENTIAL/PLATELET
Basophils Relative: 0.9 % (ref 0.0–3.0)
Eosinophils Relative: 1.3 % (ref 0.0–5.0)
Lymphocytes Relative: 32.5 % (ref 12.0–46.0)
Monocytes Relative: 8.6 % (ref 3.0–12.0)
Neutrophils Relative %: 56.7 % (ref 43.0–77.0)
Platelets: 224 10*3/uL (ref 150.0–400.0)
RBC: 4.8 Mil/uL (ref 4.22–5.81)
WBC: 4.6 10*3/uL (ref 4.5–10.5)

## 2012-05-31 LAB — PSA: PSA: 0 ng/mL — ABNORMAL LOW (ref 0.10–4.00)

## 2012-05-31 LAB — COMPREHENSIVE METABOLIC PANEL
AST: 30 U/L (ref 0–37)
Albumin: 4 g/dL (ref 3.5–5.2)
BUN: 15 mg/dL (ref 6–23)
Calcium: 9.1 mg/dL (ref 8.4–10.5)
Chloride: 106 mEq/L (ref 96–112)
Glucose, Bld: 118 mg/dL — ABNORMAL HIGH (ref 70–99)
Potassium: 4.3 mEq/L (ref 3.5–5.1)
Sodium: 142 mEq/L (ref 135–145)
Total Protein: 7.1 g/dL (ref 6.0–8.3)

## 2012-05-31 LAB — HM DIABETES FOOT EXAM: HM Diabetic Foot Exam: NORMAL

## 2012-05-31 LAB — HEMOGLOBIN A1C: Hgb A1c MFr Bld: 6.8 % — ABNORMAL HIGH (ref 4.6–6.5)

## 2012-05-31 LAB — LIPID PANEL
Cholesterol: 134 mg/dL (ref 0–200)
HDL: 40.2 mg/dL (ref >39.00)
LDL Cholesterol: 80 mg/dL (ref 0–99)
VLDL: 13.8 mg/dL (ref 0.0–40.0)

## 2012-05-31 LAB — TSH: TSH: 1.1 u[IU]/mL (ref 0.35–5.50)

## 2012-05-31 NOTE — Assessment & Plan Note (Signed)
His BP is well controlled I will check his lytes and renal function 

## 2012-05-31 NOTE — Assessment & Plan Note (Addendum)

## 2012-05-31 NOTE — Assessment & Plan Note (Signed)
I will check his a1c and will monitor his renal function 

## 2012-05-31 NOTE — Patient Instructions (Signed)

## 2012-05-31 NOTE — Progress Notes (Signed)
Subjective:    Patient ID: [name], male    DOB: [birth year], 78 y.o.   MRN: [id number]  Diabetes He presents for his follow-up diabetic visit. He has type 2 diabetes mellitus. There are no hypoglycemic associated symptoms. Pertinent negatives for hypoglycemia include no dizziness, headaches, seizures, speech difficulty or tremors. Pertinent negatives for diabetes include no blurred vision, no chest pain, no fatigue, no foot paresthesias, no foot ulcerations, no polydipsia, no polyphagia, no polyuria, no visual change, no weakness and no weight loss. There are no hypoglycemic complications. There are no diabetic complications. Current diabetic treatment includes diet. He is compliant with treatment most of the time. His weight is stable. He is following a generally healthy diet. Meal planning includes avoidance of concentrated sweets. He participates in exercise intermittently. There is no change in his home blood glucose trend. An ACE inhibitor/angiotensin II receptor blocker is being taken. He does not see a podiatrist.Eye exam is not current.      Review of Systems  Constitutional: Negative for fever, chills, weight loss, diaphoresis, activity change, appetite change, fatigue and unexpected weight change.  HENT: Negative.   Eyes: Negative.  Negative for blurred vision.  Respiratory: Negative for cough, chest tightness, shortness of breath and wheezing.   Cardiovascular: Negative for chest pain, palpitations and leg swelling.  Gastrointestinal: Negative for nausea, vomiting, abdominal pain, diarrhea, constipation and anal bleeding.  Genitourinary: Negative for dysuria, polyuria, frequency, flank pain, decreased urine volume, penile swelling, scrotal swelling, enuresis, difficulty urinating and testicular pain.  Musculoskeletal: Negative for myalgias, back pain, joint swelling, arthralgias and gait problem.  Skin: Negative.   Neurological: Negative for dizziness, tremors, seizures,  syncope, facial asymmetry, speech difficulty, weakness, light-headedness, numbness and headaches.  Hematological: Negative for polydipsia, polyphagia and adenopathy. Does not bruise/bleed easily.  Psychiatric/Behavioral: Negative.        Objective:   Physical Exam  Vitals reviewed. Constitutional: He is oriented to person, place, and time. He appears well-developed and well-nourished. No distress.  HENT:  Head: Normocephalic and atraumatic.  Mouth/Throat: Oropharynx is clear and moist. No oropharyngeal exudate.  Eyes: Conjunctivae normal are normal. Right eye exhibits no discharge. Left eye exhibits no discharge. No scleral icterus.  Neck: Normal range of motion. Neck supple. No JVD present. No tracheal deviation present. No thyromegaly present.  Cardiovascular: Normal rate, regular rhythm, normal heart sounds and intact distal pulses.  Exam reveals no gallop and no friction rub.   No murmur heard. Pulmonary/Chest: Effort normal and breath sounds normal. No stridor. No respiratory distress. He has no wheezes. He has no rales. He exhibits no tenderness.  Abdominal: Soft. Bowel sounds are normal. He exhibits no distension and no mass. There is no tenderness. There is no rebound and no guarding.  Genitourinary: Rectum normal, prostate normal and penis normal. Guaiac negative stool. No penile tenderness.  Musculoskeletal: Normal range of motion. He exhibits no edema and no tenderness.  Lymphadenopathy:    He has no cervical adenopathy.  Neurological: He is alert and oriented to person, place, and time. He has normal reflexes. He displays normal reflexes. No cranial nerve deficit. He exhibits normal muscle tone. Coordination normal.  Skin: Skin is warm and dry. No rash noted. He is not diaphoretic. No erythema. No pallor.  Psychiatric: He has a normal mood and affect. His behavior is normal. Judgment and thought content normal.     Lab Results  Component Value Date   WBC 4.5 04/15/2011    HGB 12.8* 04/15/2011  HCT 38.3* 04/15/2011   PLT 222.0 04/15/2011   GLUCOSE 107* 04/15/2011   CHOL 166 05/07/2010   TRIG 102.0 05/07/2010   HDL 38.30* 05/07/2010   LDLCALC 107* 05/07/2010   ALT 25 04/15/2011   AST 30 04/15/2011   NA 141 04/15/2011   K 4.1 04/15/2011   CL 107 04/15/2011   CREATININE 1.1 04/15/2011   BUN 15 04/15/2011   CO2 28 04/15/2011   TSH 1.71 05/07/2010   PSA 0.00* 05/07/2010   INR 1.0 07/24/2008   HGBA1C 6.7* 04/15/2011   MICROALBUR 2.9* 05/07/2010       Assessment & Plan:

## 2012-05-31 NOTE — Assessment & Plan Note (Signed)
He is doing well on dulera 

## 2012-06-28 ENCOUNTER — Ambulatory Visit: Payer: Medicare Other | Admitting: Internal Medicine

## 2012-07-05 ENCOUNTER — Encounter: Payer: Self-pay | Admitting: Internal Medicine

## 2012-07-05 ENCOUNTER — Ambulatory Visit (INDEPENDENT_AMBULATORY_CARE_PROVIDER_SITE_OTHER): Payer: Medicare Other | Admitting: Internal Medicine

## 2012-07-05 ENCOUNTER — Other Ambulatory Visit (INDEPENDENT_AMBULATORY_CARE_PROVIDER_SITE_OTHER): Payer: Medicare Other

## 2012-07-05 VITALS — BP 138/58 | HR 68 | Temp 102.3°F | Resp 16 | Wt 253.0 lb

## 2012-07-05 LAB — CBC WITH DIFFERENTIAL/PLATELET
Basophils Absolute: 0 10*3/uL (ref 0.0–0.1)
Eosinophils Absolute: 0.3 10*3/uL (ref 0.0–0.7)
Lymphocytes Relative: 7.6 % — ABNORMAL LOW (ref 12.0–46.0)
MCHC: 33 g/dL (ref 30.0–36.0)
Neutro Abs: 5.2 10*3/uL (ref 1.4–7.7)
Neutrophils Relative %: 79.8 % — ABNORMAL HIGH (ref 43.0–77.0)
Platelets: 210 10*3/uL (ref 150.0–400.0)
RDW: 14.1 % (ref 11.5–14.6)

## 2012-07-05 MED ORDER — CEFUROXIME AXETIL 500 MG PO TABS: 500.0000 mg | ORAL_TABLET | Freq: Two times a day (BID) | ORAL | Status: DC

## 2012-07-05 MED ORDER — SULFAMETHOXAZOLE-TRIMETHOPRIM 800-160 MG PO TABS: 1.0000 | ORAL_TABLET | Freq: Two times a day (BID) | ORAL | Status: DC

## 2012-07-05 NOTE — Progress Notes (Signed)
Subjective:    Patient ID: [name], male    DOB: [birth year], 78 y.o.   MRN: [id number]  HPI  He returns complaining of worsening boils over the last 3 weeks. There was one on his left chest that has opened and drained and is resolving but there are two on his right thigh that have been persistently painful and swollen.  Review of Systems  Constitutional: Negative for fever, chills, diaphoresis, activity change, appetite change, fatigue and unexpected weight change.  HENT: Negative.   Eyes: Negative.   Respiratory: Negative.  Negative for cough, shortness of breath, wheezing and stridor.   Cardiovascular: Negative.   Gastrointestinal: Negative.  Negative for nausea, vomiting, abdominal pain, diarrhea and constipation.  Endocrine: Negative.   Genitourinary: Negative.   Musculoskeletal: Negative.   Skin: Positive for wound. Negative for color change, pallor and rash.  Allergic/Immunologic: Negative.   Neurological: Negative.   Hematological: Negative.   Psychiatric/Behavioral: Negative.        Objective:   Physical Exam  Vitals reviewed. Constitutional: He is oriented to person, place, and time. Vital signs are normal. He appears well-developed and well-nourished.  Non-toxic appearance. He does not have a sickly appearance. He does not appear ill. No distress.  HENT:  Head: Normocephalic and atraumatic.  Mouth/Throat: Oropharynx is clear and moist. No oropharyngeal exudate.  Eyes: Conjunctivae are normal. Right eye exhibits no discharge. Left eye exhibits no discharge. No scleral icterus.  Neck: Normal range of motion. Neck supple. No JVD present. No tracheal deviation present. No thyromegaly present.  Cardiovascular: Normal rate, regular rhythm, normal heart sounds and intact distal pulses.  Exam reveals no gallop and no friction rub.   No murmur heard. Pulmonary/Chest: Effort normal and breath sounds normal. No stridor. No respiratory distress. He has no wheezes. He has  no rales. He exhibits no tenderness.  Abdominal: Soft. Bowel sounds are normal. He exhibits no distension and no mass. There is no tenderness. There is no rebound and no guarding.  Musculoskeletal: Normal range of motion. He exhibits no edema and no tenderness.  Lymphadenopathy:    He has no cervical adenopathy.  Neurological: He is oriented to person, place, and time.  Skin: He is not diaphoretic.     The areas on the right side were cleaned with betadine then prepped and draped in sterile fashion, local anesthesia was obtained with 2% lido with epi. Each abscess was opened with a 6 mm punch incision. They both released a moderate amount of purulent exudate that was sent for culture. The cavities were explored with H2O2/Qtips and several loculations were disrupted, they were irrigated then packed with iodoform and a dressing was applied. He tolerated the procedures well with no blood loss or complications.     Lab Results  Component Value Date   WBC 4.6 05/31/2012   HGB 13.5 05/31/2012   HCT 39.9 05/31/2012   PLT 224.0 05/31/2012   GLUCOSE 118* 05/31/2012   CHOL 134 05/31/2012   TRIG 69.0 05/31/2012   HDL 40.20 05/31/2012   LDLCALC 80 05/31/2012   ALT 25 05/31/2012   AST 30 05/31/2012   NA 142 05/31/2012   K 4.3 05/31/2012   CL 106 05/31/2012   CREATININE 1.0 05/31/2012   BUN 15 05/31/2012   CO2 28 05/31/2012   TSH 1.10 05/31/2012   PSA 0.00* 05/31/2012   INR 1.0 07/24/2008   HGBA1C 6.8* 05/31/2012   MICROALBUR 2.9* 05/07/2010       Assessment & Plan:

## 2012-07-05 NOTE — Assessment & Plan Note (Signed)
He was not aware that he had a fever, he does not appear toxic I will check his CBC, ESR, and blood culture to see if he has any evidence of systemic involvement He will start ceftin and septra

## 2012-07-05 NOTE — Assessment & Plan Note (Signed)
I and D has been done Will treat with ceftin for strep and gm negatives and septra for MRSA He will RTC in 1-2 days for recheck

## 2012-07-05 NOTE — Patient Instructions (Signed)
Abscess An abscess is an infected area that contains a collection of pus and debris. It can occur in almost any part of the body. An abscess is also known as a furuncle or boil. CAUSES   An abscess occurs when tissue gets infected. This can occur from blockage of oil or sweat glands, infection of hair follicles, or a minor injury to the skin. As the body tries to fight the infection, pus collects in the area and creates pressure under the skin. This pressure causes pain. People with weakened immune systems have difficulty fighting infections and get certain abscesses more often.   SYMPTOMS Usually an abscess develops on the skin and becomes a painful mass that is red, warm, and tender. If the abscess forms under the skin, you may feel a moveable soft area under the skin. Some abscesses break open (rupture) on their own, but most will continue to get worse without care. The infection can spread deeper into the body and eventually into the bloodstream, causing you to feel ill.   DIAGNOSIS   Your caregiver will take your medical history and perform a physical exam. A sample of fluid may also be taken from the abscess to determine what is causing your infection. TREATMENT   Your caregiver may prescribe antibiotic medicines to fight the infection. However, taking antibiotics alone usually does not cure an abscess. Your caregiver may need to make a small cut (incision) in the abscess to drain the pus. In some cases, gauze is packed into the abscess to reduce pain and to continue draining the area. HOME CARE INSTRUCTIONS    Only take over-the-counter or prescription medicines for pain, discomfort, or fever as directed by your caregiver.   If you were prescribed antibiotics, take them as directed. Finish them even if you start to feel better.   If gauze is used, follow your caregiver's directions for changing the gauze.   To avoid spreading the infection:   Keep your draining abscess covered with a  bandage.   Wash your hands well.   Do not share personal care items, towels, or whirlpools with others.   Avoid skin contact with others.   Keep your skin and clothes clean around the abscess.   Keep all follow-up appointments as directed by your caregiver.  SEEK MEDICAL CARE IF:    You have increased pain, swelling, redness, fluid drainage, or bleeding.   You have muscle aches, chills, or a general ill feeling.   You have a fever.  MAKE SURE YOU:    Understand these instructions.   Will watch your condition.   Will get help right away if you are not doing well or get worse.  Document Released: 01/29/2005 Document Revised: 10/21/2011 Document Reviewed: 07/04/2011 [employer] Patient Information 2013 [employer].    

## 2012-07-06 ENCOUNTER — Ambulatory Visit: Payer: Medicare Other | Admitting: Internal Medicine

## 2012-07-07 ENCOUNTER — Ambulatory Visit: Payer: Medicare Other | Admitting: Internal Medicine

## 2012-07-07 ENCOUNTER — Ambulatory Visit (INDEPENDENT_AMBULATORY_CARE_PROVIDER_SITE_OTHER): Payer: Medicare Other | Admitting: Internal Medicine

## 2012-07-07 ENCOUNTER — Encounter: Payer: Self-pay | Admitting: Internal Medicine

## 2012-07-07 VITALS — BP 122/78 | HR 78 | Temp 98.1°F | Ht 73.5 in | Wt 254.0 lb

## 2012-07-07 MED ORDER — HYDROCHLOROTHIAZIDE 12.5 MG PO CAPS: 12.5000 mg | ORAL_CAPSULE | Freq: Every day | ORAL | Status: DC

## 2012-07-07 NOTE — Progress Notes (Signed)
**Note De-Identified [name] Obfuscation** Subjective:    Patient ID: [name], male    DOB: [birth year], 78 y.o.   MRN: [id number]  HPI  Pt presents to the clinic today to f/u  2 abscess of his left leg. He saw Dr. Yetta Barre 2 days ago for the same. I & D was performed, exudate was cultured, positive for GNR's and GPC's. Pt was placed on Ceftin and Septra until final wound culture report is back. Pt is here today for removal of his packing. He states that it has been draining. It is not painful. Additionally, he c/o of increased swelling in his legs. This is a new problem for him. He does have a history of hypertension but has never been on medication for it. He denies difficulty breathing, shortness of breath or chest pain. His shoes are tight on his feet and his socks leave imprints on his shins.  Review of Systems       Past Medical History  Diagnosis Date  . Asthma   . COPD (chronic obstructive pulmonary disease)     FEV1 2.68/72%, FEV1/FVC 0.63 (08/22/04  . Colon polyps   . Hyperlipidemia   . HTN (hypertension)   . Prostate cancer 2010    Current Outpatient Prescriptions  Medication Sig Dispense Refill  . aspirin 81 MG EC tablet Take 81 mg by mouth daily.        Marland Kitchen atorvastatin (LIPITOR) 80 MG tablet TAKE 1 TABLET BY MOUTH EVERY DAY  90 tablet  0  . AZOR 10-40 MG per tablet TAKE 1 TABLET BY MOUTH EVERY DAY  90 each  3  . cefUROXime (CEFTIN) 500 MG tablet Take 1 tablet (500 mg total) by mouth 2 (two) times daily.  20 tablet  0  . diphenoxylate-atropine (LOMOTIL) 2.5-0.025 MG per tablet Take 1 tablet by mouth 4 (four) times daily as needed for diarrhea or loose stools.  40 tablet  0  . DULERA 100-5 MCG/ACT AERO INHALE 2 PUFFS INTO THE LUNGS BY MOUTH TWICE DAILY  39 g  2  . folic acid (FOLVITE) 400 MCG tablet Take 400 mcg by mouth every evening.        . Multiple Vitamins-Minerals (MULTIVITAMIN,TX-MINERALS) tablet Take 1 tablet by mouth daily.        . ondansetron (ZOFRAN) 4 MG tablet Take 1 tablet (4 mg total) by  mouth every 8 (eight) hours as needed for nausea.  40 tablet  0  . sulfamethoxazole-trimethoprim (SEPTRA DS) 800-160 MG per tablet Take 1 tablet by mouth 2 (two) times daily.  20 tablet  0  . vitamin E 400 UNIT capsule Take 400 Units by mouth daily.        . hydrochlorothiazide (MICROZIDE) 12.5 MG capsule Take 1 capsule (12.5 mg total) by mouth daily.  30 capsule  0   No current facility-administered medications for this visit.    Allergies  Allergen Reactions  . Ace Inhibitors     REACTION: cough    Family History  Problem Relation Age of Onset  . Arthritis Other   . Esophagitis Mother     STRICTURE  . Colon cancer Father   . Prostate cancer Father   . Cancer Father     prostate    History   Social History  . Marital Status: Married    Spouse Name: N/A    Number of Children: N/A  . Years of Education: N/A   Occupational History  . Not on file.   Social History Main Topics  . **Note De-Identified [name] Obfuscation** Smoking status: Former Games developer  . Smokeless tobacco: Never Used  . Alcohol Use: No  . Drug Use: No  . Sexually Active: Not Currently   Other Topics Concern  . Not on file   Social History Narrative   Regular Exercise -  NO           Constitutional: Denies fever, malaise, fatigue, headache or abrupt weight changes.  Respiratory: Denies difficulty breathing, shortness of breath, cough or sputum production.   Cardiovascular: Pt reports swelling of bilateral lower extremities. Denies chest pain, chest tightness, palpitations or swelling in the hands.  Musculoskeletal: Denies decrease in range of motion, difficulty with gait, muscle pain or joint pain and swelling.  Skin: Pt reports 2 large abscesses on right leg. Denies redness, rashes, lesions or ulcercations.    No other specific complaints in a complete review of systems (except as listed in HPI above).   Objective:   Physical Exam   BP 122/78  Pulse 78  Temp(Src) 98.1 F (36.7 C) (Oral)  Ht 6' 1.5" (1.867 m)  Wt 254 lb  (115.214 kg)  BMI 33.05 kg/m2  SpO2 93% Wt Readings from Last 3 Encounters:  07/07/12 254 lb (115.214 kg)  07/05/12 253 lb (114.76 kg)  05/31/12 247 lb 1.9 oz (112.093 kg)    General: Appears their stated age, well developed, well nourished in NAD. Skin: 2 large abscess of right thigh, packed with evidence of purulent drainage. Skin around abscesses excoriated. Cardiovascular: Normal rate and rhythm. S1,S2 noted.  No murmur, rubs or gallops noted. No JVD, 1+  Pretibial pitting edema. No carotid bruits noted. Pulmonary/Chest: Normal effort and positive vesicular breath sounds. No respiratory distress. No wheezes, rales or ronchi noted.  Musculoskeletal: Normal range of motion. No signs of joint swelling. No difficulty with gait.         Assessment & Plan:   Cellulitis and abscess of right leg, additional workup required:  Using aseptic technique, packing was removed. Wound was irrigated with saline. Packing was reinserted and covered with a large bandage. Pt will remain of Ceftin and Septra until final culture results come back.  Peripheral edema, new onset, no additional workup required at this time:  Will give eRx for HCTZ 12.5 mg Try to avoid sodium in your diet If swelling continues, may need to get echo of heart to assess fluid status  RTC in 2 days to have packing removed and wound checked

## 2012-07-07 NOTE — Patient Instructions (Signed)
Abscess  Care After  An abscess (also called a boil or furuncle) is an infected area that contains a collection of pus. Signs and symptoms of an abscess include pain, tenderness, redness, or hardness, or you may feel a moveable soft area under your skin. An abscess can occur anywhere in the body. The infection may spread to surrounding tissues causing cellulitis. A cut (incision) by the surgeon was made over your abscess and the pus was drained out. Gauze may have been packed into the space to provide a drain that will allow the cavity to heal from the inside outwards. The boil may be painful for 5 to 7 days. Most people with a boil do not have high fevers. Your abscess, if seen early, may not have localized, and may not have been lanced. If not, another appointment may be required for this if it does not get better on its own or with medications.  HOME CARE INSTRUCTIONS   · Only take over-the-counter or prescription medicines for pain, discomfort, or fever as directed by your caregiver.  · When you bathe, soak and then remove gauze or iodoform packs at least daily or as directed by your caregiver. You may then wash the wound gently with mild soapy water. Repack with gauze or do as your caregiver directs.  SEEK IMMEDIATE MEDICAL CARE IF:   · You develop increased pain, swelling, redness, drainage, or bleeding in the wound site.  · You develop signs of generalized infection including muscle aches, chills, fever, or a general ill feeling.  · An oral temperature above 102° F (38.9° C) develops, not controlled by medication.  See your caregiver for a recheck if you develop any of the symptoms described above. If medications (antibiotics) were prescribed, take them as directed.  Document Released: 11/07/2004 Document Revised: 07/14/2011 Document Reviewed: 07/05/2007  ExitCare® Patient Information ©2013 [employer].

## 2012-07-08 LAB — CULTURE, BLOOD (SINGLE)

## 2012-07-09 ENCOUNTER — Ambulatory Visit: Payer: Medicare Other | Admitting: Internal Medicine

## 2012-07-10 LAB — WOUND CULTURE

## 2012-07-14 ENCOUNTER — Ambulatory Visit: Payer: Medicare Other

## 2012-07-14 ENCOUNTER — Encounter: Payer: Self-pay | Admitting: Internal Medicine

## 2012-07-14 ENCOUNTER — Ambulatory Visit (INDEPENDENT_AMBULATORY_CARE_PROVIDER_SITE_OTHER): Payer: Medicare Other | Admitting: Internal Medicine

## 2012-07-14 VITALS — BP 150/72 | HR 81 | Temp 97.8°F | Resp 16 | Wt 247.8 lb

## 2012-07-14 DIAGNOSIS — T8189XA Other complications of procedures, not elsewhere classified, initial encounter: Secondary | ICD-10-CM

## 2012-07-14 NOTE — Patient Instructions (Signed)
Wound Care  Wound care helps prevent pain and infection.    You may need a tetanus shot if:   You cannot remember when you had your last tetanus shot.   You have never had a tetanus shot.   The injury broke your skin.  If you need a tetanus shot and you choose not to have one, you may get tetanus. Sickness from tetanus can be serious.  HOME CARE     Only take medicine as told by your doctor.   Clean the wound daily with mild soap and water.   Change any bandages (dressings) as told by your doctor.   Put medicated cream and a bandage on the wound as told by your doctor.   Change the bandage if it gets wet, dirty, or starts to smell.   Take showers. Do not take baths, swim, or do anything that puts your wound under water.   Rest and raise (elevate) the wound until the pain and puffiness (swelling) are better.   Keep all doctor visits as told.  GET HELP RIGHT AWAY IF:     Yellowish-white fluid (pus) comes from the wound.   Medicine does not lessen your pain.   There is a red streak going away from the wound.   You have a fever.  MAKE SURE YOU:     Understand these instructions.   Will watch your condition.   Will get help right away if you are not doing well or get worse.  Document Released: 01/29/2008 Document Revised: 07/14/2011 Document Reviewed: 08/25/2010  [employer] Patient Information 2013 [employer].

## 2012-07-15 ENCOUNTER — Encounter: Payer: Self-pay | Admitting: Internal Medicine

## 2012-07-15 NOTE — Progress Notes (Signed)
  Subjective:    Patient ID: [name], male    DOB: Sep 01, 1934, 78 y.o.   MRN: [id number]  Wound Check He was originally treated more than 14 days ago. Previous treatment included I&D of abscess. His temperature was unmeasured prior to arrival. There has been no drainage from the wound. There is no redness present. There is no swelling present. The pain has no pain. He has no difficulty moving the affected extremity or digit.      Review of Systems  Constitutional: Negative.  Negative for fever, chills and diaphoresis.  HENT: Negative.   Eyes: Negative.   Respiratory: Negative for cough, chest tightness, shortness of breath, wheezing and stridor.   Cardiovascular: Negative for chest pain, palpitations and leg swelling.  Gastrointestinal: Negative for nausea, vomiting, diarrhea, constipation and anal bleeding.  Endocrine: Negative.   Genitourinary: Negative.   Musculoskeletal: Negative.   Skin: Positive for wound. Negative for color change, pallor and rash.  Allergic/Immunologic: Negative.   Neurological: Negative.  Negative for dizziness.  Hematological: Negative for adenopathy. Does not bruise/bleed easily.  Psychiatric/Behavioral: Negative.        Objective:   Physical Exam  Vitals reviewed. Constitutional: He is oriented to person, place, and time. He appears well-developed and well-nourished. No distress.  HENT:  Head: Normocephalic and atraumatic.  Mouth/Throat: Oropharynx is clear and moist. No oropharyngeal exudate.  Eyes: Conjunctivae are normal. Right eye exhibits no discharge. Left eye exhibits no discharge. No scleral icterus.  Neck: Normal range of motion. Neck supple. No JVD present. No tracheal deviation present. No thyromegaly present.  Cardiovascular: Normal rate, regular rhythm, normal heart sounds and intact distal pulses.  Exam reveals no gallop and no friction rub.   No murmur heard. Pulmonary/Chest: Effort normal and breath sounds normal. No  stridor. No respiratory distress. He has no wheezes. He has no rales. He exhibits no tenderness.  Abdominal: Soft. Bowel sounds are normal. He exhibits no distension. There is no tenderness. There is no rebound and no guarding.  Musculoskeletal: Normal range of motion. He exhibits no edema and no tenderness.  Lymphadenopathy:    He has no cervical adenopathy.  Neurological: He is oriented to person, place, and time.  Skin: Skin is warm and dry. No abrasion, no bruising, no burn, no ecchymosis, no laceration, no lesion, no petechiae and no rash noted. He is not diaphoretic. No erythema. No pallor.     Psychiatric: He has a normal mood and affect. His behavior is normal. Judgment and thought content normal.          Assessment & Plan:

## 2012-07-15 NOTE — Assessment & Plan Note (Signed)
It appears that the infection has resolved Culture sent to see if there is any persistent infection Antibiotics were completed

## 2012-07-15 NOTE — Assessment & Plan Note (Signed)
I have asked him to see the wound care center for evaluation on the ongoing management of this

## 2012-07-19 ENCOUNTER — Encounter (HOSPITAL_BASED_OUTPATIENT_CLINIC_OR_DEPARTMENT_OTHER): Payer: Medicare Other

## 2012-07-19 ENCOUNTER — Other Ambulatory Visit: Payer: Self-pay | Admitting: Internal Medicine

## 2012-07-22 LAB — WOUND CULTURE

## 2012-09-20 ENCOUNTER — Encounter: Payer: Self-pay | Admitting: Internal Medicine

## 2012-09-20 ENCOUNTER — Ambulatory Visit (INDEPENDENT_AMBULATORY_CARE_PROVIDER_SITE_OTHER): Payer: Medicare Other | Admitting: Internal Medicine

## 2012-09-20 VITALS — BP 132/84 | HR 72 | Temp 99.0°F | Resp 16 | Wt 239.0 lb

## 2012-09-20 DIAGNOSIS — J449 Chronic obstructive pulmonary disease, unspecified: Secondary | ICD-10-CM

## 2012-09-20 DIAGNOSIS — R509 Fever, unspecified: Secondary | ICD-10-CM

## 2012-09-20 DIAGNOSIS — J441 Chronic obstructive pulmonary disease with (acute) exacerbation: Secondary | ICD-10-CM

## 2012-09-20 DIAGNOSIS — J4489 Other specified chronic obstructive pulmonary disease: Secondary | ICD-10-CM

## 2012-09-20 DIAGNOSIS — L03119 Cellulitis of unspecified part of limb: Secondary | ICD-10-CM

## 2012-09-20 MED ORDER — UMECLIDINIUM-VILANTEROL 62.5-25 MCG/INH IN AEPB: 1.0000 | INHALATION_SPRAY | Freq: Every day | RESPIRATORY_TRACT | Status: DC

## 2012-09-20 MED ORDER — METHYLPREDNISOLONE ACETATE 40 MG/ML IJ SUSP
40.0000 mg | Freq: Once | INTRAMUSCULAR | Status: AC
  Administered 2012-09-20: 40 mg via INTRAMUSCULAR

## 2012-09-20 MED ORDER — CEFUROXIME AXETIL 500 MG PO TABS: 500.0000 mg | ORAL_TABLET | Freq: Two times a day (BID) | ORAL | Status: DC

## 2012-09-20 MED ORDER — METHYLPREDNISOLONE ACETATE 80 MG/ML IJ SUSP
80.0000 mg | Freq: Once | INTRAMUSCULAR | Status: AC
  Administered 2012-09-20: 80 mg via INTRAMUSCULAR

## 2012-09-20 NOTE — Assessment & Plan Note (Addendum)
Stop dulera Start Anoro for better symptom relief and better compliance (he was having trouble keeping up with the BID dosing of dulera) He was given a sample of Anoro and I showed him how to use it and he demonstrated proficiency with its use

## 2012-09-20 NOTE — Assessment & Plan Note (Signed)
He was given an injection of depo-medrol IM I will treat the infection with ceftin

## 2012-09-20 NOTE — Addendum Note (Signed)
Addended by: Edwena Felty T on: 09/20/2012 02:23 PM   Modules accepted: Orders

## 2012-09-20 NOTE — Progress Notes (Signed)
Subjective:    Patient ID: [name], male    DOB: [birth year], 78 y.o.   MRN: [id number]  Cough This is a new problem. The current episode started in the past 7 days. The problem has been unchanged. The problem occurs every few hours. The cough is productive of sputum. Associated symptoms include shortness of breath. Pertinent negatives include no chest pain, chills, ear congestion, ear pain, fever, headaches, heartburn, hemoptysis, myalgias, nasal congestion, postnasal drip, rash, rhinorrhea, sore throat, sweats, weight loss or wheezing. He has tried steroid inhaler and a beta-agonist inhaler for the symptoms. The treatment provided mild relief. His past medical history is significant for COPD. There is no history of asthma, bronchiectasis, bronchitis, emphysema, environmental allergies or pneumonia.      Review of Systems  Constitutional: Negative.  Negative for fever, chills, weight loss, diaphoresis, activity change, appetite change, fatigue and unexpected weight change.  HENT: Negative.  Negative for ear pain, sore throat, facial swelling, rhinorrhea, trouble swallowing, voice change and postnasal drip.   Eyes: Negative.   Respiratory: Positive for cough and shortness of breath. Negative for apnea, hemoptysis, choking, chest tightness, wheezing and stridor.   Cardiovascular: Negative for chest pain, palpitations and leg swelling.  Gastrointestinal: Negative.  Negative for heartburn, nausea, vomiting, abdominal pain, diarrhea and constipation.  Endocrine: Negative.   Genitourinary: Negative.   Musculoskeletal: Negative.  Negative for myalgias.  Skin: Negative.  Negative for rash.  Allergic/Immunologic: Negative.  Negative for environmental allergies.  Neurological: Negative.  Negative for headaches.  Hematological: Negative for adenopathy. Does not bruise/bleed easily.  Psychiatric/Behavioral: Negative.        Objective:   Physical Exam  Vitals reviewed. Constitutional: He  is oriented to person, place, and time. He appears well-developed and well-nourished.  Non-toxic appearance. He does not have a sickly appearance. He does not appear ill. No distress.  HENT:  Head: Normocephalic and atraumatic.  Mouth/Throat: Oropharynx is clear and moist. No oropharyngeal exudate.  Eyes: Conjunctivae are normal. Right eye exhibits no discharge. Left eye exhibits no discharge. No scleral icterus.  Neck: Normal range of motion. Neck supple. No JVD present. No tracheal deviation present. No thyromegaly present.  Cardiovascular: Normal rate, regular rhythm, normal heart sounds and intact distal pulses.  Exam reveals no gallop and no friction rub.   No murmur heard. Pulmonary/Chest: Effort normal. No accessory muscle usage or stridor. Not tachypneic. No respiratory distress. He has no decreased breath sounds. He has no wheezes. He has rhonchi in the right middle field and the left middle field. He has no rales. He exhibits no tenderness.  Abdominal: Soft. Bowel sounds are normal. He exhibits no distension and no mass. There is no tenderness. There is no rebound and no guarding.  Musculoskeletal: Normal range of motion. He exhibits no edema and no tenderness.  Lymphadenopathy:    He has no cervical adenopathy.  Neurological: He is oriented to person, place, and time.  Skin: Skin is warm and dry. No rash noted. He is not diaphoretic. No erythema. No pallor.  Psychiatric: He has a normal mood and affect. His behavior is normal. Judgment and thought content normal.      Lab Results  Component Value Date   WBC 6.5 07/05/2012   HGB 11.4* 07/05/2012   HCT 34.6* 07/05/2012   PLT 210.0 07/05/2012   GLUCOSE 118* 05/31/2012   CHOL 134 05/31/2012   TRIG 69.0 05/31/2012   HDL 40.20 05/31/2012   LDLCALC 80 05/31/2012   ALT 25 05/31/2012  AST 30 05/31/2012   NA 142 05/31/2012   K 4.3 05/31/2012   CL 106 05/31/2012   CREATININE 1.0 05/31/2012   BUN 15 05/31/2012   CO2 28 05/31/2012   TSH 1.10  05/31/2012   PSA 0.00* 05/31/2012   INR 1.0 07/24/2008   HGBA1C 6.8* 05/31/2012   MICROALBUR 2.9* 05/07/2010      Assessment & Plan:

## 2012-09-20 NOTE — Patient Instructions (Signed)

## 2012-09-29 ENCOUNTER — Encounter: Payer: Self-pay | Admitting: Internal Medicine

## 2012-09-29 ENCOUNTER — Ambulatory Visit (INDEPENDENT_AMBULATORY_CARE_PROVIDER_SITE_OTHER): Payer: Medicare Other | Admitting: Internal Medicine

## 2012-09-29 VITALS — BP 138/72 | HR 75 | Temp 99.3°F | Resp 16 | Wt 238.0 lb

## 2012-09-29 DIAGNOSIS — J42 Unspecified chronic bronchitis: Secondary | ICD-10-CM

## 2012-09-29 DIAGNOSIS — J441 Chronic obstructive pulmonary disease with (acute) exacerbation: Secondary | ICD-10-CM

## 2012-09-29 DIAGNOSIS — J449 Chronic obstructive pulmonary disease, unspecified: Secondary | ICD-10-CM

## 2012-09-29 MED ORDER — ROFLUMILAST 500 MCG PO TABS: 500.0000 ug | ORAL_TABLET | Freq: Every day | ORAL | Status: DC

## 2012-09-29 MED ORDER — MOXIFLOXACIN HCL 400 MG PO TABS: 400.0000 mg | ORAL_TABLET | Freq: Every day | ORAL | Status: DC

## 2012-09-29 MED ORDER — UMECLIDINIUM-VILANTEROL 62.5-25 MCG/INH IN AEPB: 1.0000 | INHALATION_SPRAY | Freq: Every day | RESPIRATORY_TRACT | Status: DC

## 2012-09-29 NOTE — Progress Notes (Signed)
Subjective:    Patient ID: [name], male    DOB: Mar 10, 1935, 78 y.o.   MRN: [id number]  Cough This is a recurrent problem. The current episode started 1 to 4 weeks ago. The problem has been gradually worsening. The problem occurs every few hours. The cough is productive of purulent sputum. Associated symptoms include chills and wheezing. Pertinent negatives include no chest pain, ear congestion, ear pain, fever, headaches, heartburn, hemoptysis, myalgias, nasal congestion, postnasal drip, rash, rhinorrhea, sore throat, shortness of breath, sweats or weight loss. Nothing aggravates the symptoms. Treatments tried: anoro and ceftin. The treatment provided no relief. His past medical history is significant for bronchitis and COPD. There is no history of asthma, bronchiectasis, environmental allergies or pneumonia.      Review of Systems  Constitutional: Positive for chills. Negative for fever, weight loss, diaphoresis, activity change, appetite change, fatigue and unexpected weight change.  HENT: Negative.  Negative for ear pain, sore throat, rhinorrhea and postnasal drip.   Eyes: Negative.   Respiratory: Positive for cough and wheezing. Negative for apnea, hemoptysis, choking, chest tightness, shortness of breath and stridor.   Cardiovascular: Negative for chest pain, palpitations and leg swelling.  Gastrointestinal: Negative.  Negative for heartburn.  Endocrine: Negative.   Genitourinary: Negative.   Musculoskeletal: Negative.  Negative for myalgias.  Skin: Negative.  Negative for rash.  Allergic/Immunologic: Negative.  Negative for environmental allergies.  Neurological: Negative.  Negative for headaches.  Hematological: Negative.   Psychiatric/Behavioral: Negative.        Objective:   Physical Exam  Vitals reviewed. Constitutional: He is oriented to person, place, and time. He appears well-developed and well-nourished.  Non-toxic appearance. He does not have a sickly  appearance. He does not appear ill. No distress.  HENT:  Head: Normocephalic and atraumatic.  Mouth/Throat: Oropharynx is clear and moist. No oropharyngeal exudate.  Eyes: Conjunctivae are normal. Right eye exhibits no discharge. Left eye exhibits no discharge. No scleral icterus.  Neck: Normal range of motion. Neck supple. No JVD present. No tracheal deviation present. No thyromegaly present.  Cardiovascular: Normal rate, regular rhythm, normal heart sounds and intact distal pulses.  Exam reveals no gallop.   No murmur heard. Pulmonary/Chest: Effort normal and breath sounds normal. No accessory muscle usage or stridor. Not tachypneic. No respiratory distress. He has no decreased breath sounds. He has no wheezes. He has no rhonchi. He has no rales. He exhibits no tenderness.  Abdominal: Soft. Bowel sounds are normal. He exhibits no distension and no mass. There is no tenderness. There is no rebound and no guarding.  Musculoskeletal: Normal range of motion. He exhibits no edema and no tenderness.  Lymphadenopathy:    He has no cervical adenopathy.  Neurological: He is oriented to person, place, and time.  Skin: Skin is warm and dry. No rash noted. He is not diaphoretic. No erythema. No pallor.  Psychiatric: He has a normal mood and affect. His behavior is normal. Judgment and thought content normal.      Lab Results  Component Value Date   WBC 6.5 07/05/2012   HGB 11.4* 07/05/2012   HCT 34.6* 07/05/2012   PLT 210.0 07/05/2012   GLUCOSE 118* 05/31/2012   CHOL 134 05/31/2012   TRIG 69.0 05/31/2012   HDL 40.20 05/31/2012   LDLCALC 80 05/31/2012   ALT 25 05/31/2012   AST 30 05/31/2012   NA 142 05/31/2012   K 4.3 05/31/2012   CL 106 05/31/2012   CREATININE 1.0 05/31/2012  BUN 15 05/31/2012   CO2 28 05/31/2012   TSH 1.10 05/31/2012   PSA 0.00* 05/31/2012   INR 1.0 07/24/2008   HGBA1C 6.8* 05/31/2012   MICROALBUR 2.9* 05/07/2010      Assessment & Plan:

## 2012-09-29 NOTE — Assessment & Plan Note (Signed)
He is having frequent flare ups so I have added daliresp to his treatment regimen

## 2012-09-29 NOTE — Assessment & Plan Note (Signed)
He is having another flare so I changed the antibiotic to avelox I have also asked him to add daliresp to his treatment He will continue anoro

## 2012-09-29 NOTE — Patient Instructions (Signed)
Chronic Obstructive Pulmonary Disease Exacerbation  Chronic obstructive pulmonary disease (COPD) is a condition that limits airflow. COPD may include chronic bronchitis, pulmonary emphysema, or both. COPD exacerbation means that your COPD has gotten worse. Without treatment, this can be a life-threatening problem. COPD exacerbation requires immediate medical care.  CAUSES   COPD exacerbation can be caused by:   Exposure to smoke.   Exposure to air pollution, chemical fumes, or dust.   Respiratory infections.   Genetics, particularly alpha 1-antitrypsin deficiency.   A condition in which the body's immune system attacks itself (autoimmunity).  SYMPTOMS    Increased coughing.   Increased wheezing.   Increased shortness of breath.   Swelling due to a buildup of fluid (peripheral edema) related to heart strain.   Rapid breathing.   Chest enlargement (barrel chest).   Chest tightness.  DIAGNOSIS   There is no single test that can diagnosis COPD exacerbation. Your history, physical exam, and other tests will help your caregiver make a diagnosis. Tests may include a chest X-ray, pulmonary function tests, spirometry, basic lab tests, and an arterial blood gas test.  TREATMENT   Severe problems may require a stay in the hospital. Depending on the cause of your problems, the following may be prescribed:   Antibiotic medicines.   Bronchodilators (inhaled or tablets).   Cortisone medicines (inhaled or tablets).   Supplemental oxygen therapy.   Pulmonary rehabilitation. This is a broad program that may involve exercise, nutrition counseling, breathing techniques, and further education about your condition.  It is important to use good technique with inhaled medicines. Spacer devices may be needed to help improve drug delivery.  HOME CARE INSTRUCTIONS    Do not smoke. Quitting smoking is very important to prevent worsening of COPD.   Avoid exposure to all substances that irritate the airway, especially tobacco  smoke.   If prescribed, take your antibiotics as directed. Finish them even if you start to feel better.   Only take over-the-counter or prescription medicines as directed by your caregiver.   Drink enough fluids to keep your urine clear or pale yellow. This can help thin bronchial secretions.   Use a cool mist vaporizer. This makes it easier to clear your chest when you cough.   If you have a home nebulizer and oxygen, continue to use them as directed.   Maintain all necessary vaccinations to prevent infections.   Exercise regularly.   Eat a healthy diet.   Keep all follow-up appointments as directed by your caregiver.  SEEK IMMEDIATE MEDICAL CARE IF:   You have extreme shortness of breath.   You have trouble talking.   You have severe chest pain or blood in your sputum.   You have a high fever, weakness, repeated vomiting, or fainting.   You feel confused.   You keep getting worse.  MAKE SURE YOU:    Understand these instructions.   Will watch your condition.   Will get help right away if you are not doing well or get worse.  Document Released: 02/16/2007 Document Revised: 07/14/2011 Document Reviewed: 12/17/2010  [employer] Patient Information 2014 [employer].

## 2012-09-29 NOTE — Assessment & Plan Note (Signed)
Continue anoro Add daliresp

## 2012-10-20 ENCOUNTER — Telehealth: Payer: Self-pay

## 2012-10-20 NOTE — Telephone Encounter (Signed)
Received PA request for Daliresp, form received and faxed back to [employer]

## 2012-10-25 ENCOUNTER — Other Ambulatory Visit: Payer: Self-pay | Admitting: Internal Medicine

## 2012-10-25 ENCOUNTER — Telehealth: Payer: Self-pay | Admitting: Internal Medicine

## 2012-10-25 DIAGNOSIS — J42 Unspecified chronic bronchitis: Secondary | ICD-10-CM

## 2012-10-25 DIAGNOSIS — J441 Chronic obstructive pulmonary disease with (acute) exacerbation: Secondary | ICD-10-CM

## 2012-10-25 MED ORDER — UMECLIDINIUM-VILANTEROL 62.5-25 MCG/INH IN AEPB: 1.0000 | INHALATION_SPRAY | Freq: Every day | RESPIRATORY_TRACT | Status: DC

## 2012-10-25 NOTE — Telephone Encounter (Signed)
Medicare denied the RX for Daliresp.  It may need a PA.  The pharmacy is [facility] on [address].

## 2012-10-26 NOTE — Telephone Encounter (Signed)
Called [facility] who stated that patient did not need a PA for Dalire sp. Pharmacy stated that patient has reach a coverage gap and until gap has passed patient will have to pay the cost out of pocket.

## 2012-10-26 NOTE — Telephone Encounter (Signed)
If his insurance won't pay for Dalire, is there something cheaper or do we have samples of something that will work?

## 2012-10-27 NOTE — Telephone Encounter (Signed)
No, there is no alternative to daliresp

## 2012-10-27 NOTE — Telephone Encounter (Signed)
Received fax response from [employer] advising request was received and and has an approved request on file. Pt wife and pharmacy notified

## 2012-11-01 ENCOUNTER — Other Ambulatory Visit: Payer: Self-pay | Admitting: *Deleted

## 2012-11-01 DIAGNOSIS — J42 Unspecified chronic bronchitis: Secondary | ICD-10-CM

## 2012-11-01 DIAGNOSIS — J441 Chronic obstructive pulmonary disease with (acute) exacerbation: Secondary | ICD-10-CM

## 2012-11-01 MED ORDER — ROFLUMILAST 500 MCG PO TABS: 500.0000 ug | ORAL_TABLET | Freq: Every day | ORAL | Status: DC

## 2012-11-01 NOTE — Telephone Encounter (Signed)
Medication approved and pharmacy notified.

## 2012-11-01 NOTE — Telephone Encounter (Signed)
Malachi Bonds, pt's wife called states pt needs Rx for Daliresp.  Rx sent to [facility] pharmacy.

## 2012-11-18 ENCOUNTER — Ambulatory Visit (INDEPENDENT_AMBULATORY_CARE_PROVIDER_SITE_OTHER): Payer: Medicare Other | Admitting: Internal Medicine

## 2012-11-18 ENCOUNTER — Encounter: Payer: Self-pay | Admitting: Internal Medicine

## 2012-11-18 VITALS — BP 136/80 | HR 88 | Ht 74.0 in | Wt 239.4 lb

## 2012-11-18 DIAGNOSIS — J449 Chronic obstructive pulmonary disease, unspecified: Secondary | ICD-10-CM

## 2012-11-18 DIAGNOSIS — J4489 Other specified chronic obstructive pulmonary disease: Secondary | ICD-10-CM

## 2012-11-18 MED ORDER — AEROCHAMBER MV MISC: Status: DC

## 2012-11-18 MED ORDER — MOMETASONE FURO-FORMOTEROL FUM 100-5 MCG/ACT IN AERO: INHALATION_SPRAY | RESPIRATORY_TRACT | Status: DC

## 2012-11-18 MED ORDER — MOMETASONE FURO-FORMOTEROL FUM 100-5 MCG/ACT IN AERO: 2.0000 | INHALATION_SPRAY | Freq: Two times a day (BID) | RESPIRATORY_TRACT | Status: AC

## 2012-11-18 NOTE — Patient Instructions (Addendum)
The throat irritation may be coming from the Anoro powder  Script for Aerochamber spacer    Sample and Script for Dulera 100.   2 puffs, through the Aerochamber spacer, twice daily    Try this INSTEAD of ANORO for now. See if the throat irritation goes away and the [facility] does as good a job for your breathing.  Since the Daliresp pill is so expensive for you, use up what you have. Then we will see how you have done with it before we decide about refilling.

## 2012-11-18 NOTE — Progress Notes (Signed)
11/18/12- 77 former smoker  yoM re-establishing for COPD                            CP Dr Karie Schwalbe. [name] Has been using Anoro, Daliresp. FOLLOWS FOR: throat congestion, wheezing at night, occ prod cough with clear mucus x3days Main complaint is increased throat clearing for 6 weeks. Dr. Yetta Moran gave Daliresp, helpful but too expensive. Dulera changed to lAnoro, started shortly before throat became hoarse. Denies dysphagia or throat pain. Occasional sneeze. CXR 06/13/11 IMPRESSION:  Stable chest x-ray with slight hyperaeration. No active lung  disease.  Original Report Authenticated By: Juline Patch, M.D.  ROS-see HPI Constitutional:   No-   weight loss, night sweats, fevers, chills, fatigue, lassitude. HEENT:   No-  headaches, difficulty swallowing, tooth/dental problems, sore throat,       No-  sneezing, itching, ear ache, nasal congestion, post nasal drip,  CV:  No-   chest pain, orthopnea, PND, swelling in lower extremities, anasarca, dizziness, palpitations Resp: + shortness of breath with exertion or at rest.              No-   productive cough,  No non-productive cough,  No- coughing up of blood.              No-   change in color of mucus.  No- wheezing.   Skin: No-   rash or lesions. GI:  No-   heartburn, indigestion, abdominal pain, nausea, vomiting, GU:  MS:  No-   joint pain or swelling.   Neuro-     nothing unusual Psych:  No- change in mood or affect. No depression or anxiety.  No memory loss.  OBJ- Physical Exam General- Alert, Oriented, Affect-appropriate, Distress- none acute Skin- rash-none, lesions- none, excoriation- none Lymphadenopathy- none Head- atraumatic            Eyes- Gross vision intact, PERRLA, conjunctivae and secretions clear            Ears- Hearing, canals-normal            Nose- Clear, no-Septal dev, mucus, polyps, erosion, perforation             Throat- Mallampati III , mucosa clear , drainage- none, tonsils- atrophic Neck- flexible , trachea midline, no  stridor , thyroid nl, carotid no bruit Chest - symmetrical excursion , unlabored           Heart/CV- RRR , no murmur , no gallop  , no rub, nl s1 s2                           - JVD- none , edema- none, stasis changes- none, varices- none           Lung- clear to P&A, wheeze- none, cough- none , dullness-none, rub- none           Chest wall-  Abd-  Br/ Gen/ Rectal- Not done, not indicated Extrem- cyanosis- none, clubbing, none, atrophy- none, strength- nl Neuro- grossly intact to observation

## 2012-12-01 ENCOUNTER — Other Ambulatory Visit (INDEPENDENT_AMBULATORY_CARE_PROVIDER_SITE_OTHER): Payer: Medicare Other

## 2012-12-01 ENCOUNTER — Other Ambulatory Visit: Payer: Medicare Other

## 2012-12-01 ENCOUNTER — Ambulatory Visit (INDEPENDENT_AMBULATORY_CARE_PROVIDER_SITE_OTHER): Payer: Medicare Other | Admitting: Internal Medicine

## 2012-12-01 ENCOUNTER — Encounter: Payer: Self-pay | Admitting: Internal Medicine

## 2012-12-01 VITALS — BP 138/86 | HR 58 | Temp 97.7°F | Resp 16 | Wt 235.0 lb

## 2012-12-01 DIAGNOSIS — L03119 Cellulitis of unspecified part of limb: Secondary | ICD-10-CM

## 2012-12-01 DIAGNOSIS — I10 Essential (primary) hypertension: Secondary | ICD-10-CM

## 2012-12-01 DIAGNOSIS — R509 Fever, unspecified: Secondary | ICD-10-CM

## 2012-12-01 DIAGNOSIS — T8189XA Other complications of procedures, not elsewhere classified, initial encounter: Secondary | ICD-10-CM

## 2012-12-01 DIAGNOSIS — L02413 Cutaneous abscess of right upper limb: Secondary | ICD-10-CM | POA: Insufficient documentation

## 2012-12-01 DIAGNOSIS — IMO0002 Reserved for concepts with insufficient information to code with codable children: Secondary | ICD-10-CM

## 2012-12-01 DIAGNOSIS — IMO0001 Reserved for inherently not codable concepts without codable children: Secondary | ICD-10-CM

## 2012-12-01 DIAGNOSIS — L02419 Cutaneous abscess of limb, unspecified: Secondary | ICD-10-CM

## 2012-12-01 LAB — BASIC METABOLIC PANEL
Calcium: 9.3 mg/dL (ref 8.4–10.5)
GFR: 98.03 mL/min (ref >60.00)
Potassium: 4.2 mEq/L (ref 3.5–5.1)
Sodium: 141 mEq/L (ref 135–145)

## 2012-12-01 LAB — HEMOGLOBIN A1C: Hgb A1c MFr Bld: 7.1 % — ABNORMAL HIGH (ref 4.6–6.5)

## 2012-12-01 MED ORDER — RIFAMPIN 300 MG PO CAPS: 300.0000 mg | ORAL_CAPSULE | Freq: Two times a day (BID) | ORAL | Status: DC

## 2012-12-01 MED ORDER — SULFAMETHOXAZOLE-TMP DS 800-160 MG PO TABS: 1.0000 | ORAL_TABLET | Freq: Two times a day (BID) | ORAL | Status: DC

## 2012-12-01 NOTE — Patient Instructions (Signed)
Abscess An abscess is an infected area that contains a collection of pus and debris.It can occur in almost any part of the body. An abscess is also known as a furuncle or boil. CAUSES  An abscess occurs when tissue gets infected. This can occur from blockage of oil or sweat glands, infection of hair follicles, or a minor injury to the skin. As the body tries to fight the infection, pus collects in the area and creates pressure under the skin. This pressure causes pain. People with weakened immune systems have difficulty fighting infections and get certain abscesses more often.  SYMPTOMS Usually an abscess develops on the skin and becomes a painful mass that is red, warm, and tender. If the abscess forms under the skin, you may feel a moveable soft area under the skin. Some abscesses break open (rupture) on their own, but most will continue to get worse without care. The infection can spread deeper into the body and eventually into the bloodstream, causing you to feel ill.  DIAGNOSIS  Your caregiver will take your medical history and perform a physical exam. A sample of fluid may also be taken from the abscess to determine what is causing your infection. TREATMENT  Your caregiver may prescribe antibiotic medicines to fight the infection. However, taking antibiotics alone usually does not cure an abscess. Your caregiver may need to make a small cut (incision) in the abscess to drain the pus. In some cases, gauze is packed into the abscess to reduce pain and to continue draining the area. HOME CARE INSTRUCTIONS   Only take over-the-counter or prescription medicines for pain, discomfort, or fever as directed by your caregiver.  If you were prescribed antibiotics, take them as directed. Finish them even if you start to feel better.  If gauze is used, follow your caregiver's directions for changing the gauze.  To avoid spreading the infection:  Keep your draining abscess covered with a  bandage.  Wash your hands well.  Do not share personal care items, towels, or whirlpools with others.  Avoid skin contact with others.  Keep your skin and clothes clean around the abscess.  Keep all follow-up appointments as directed by your caregiver. SEEK MEDICAL CARE IF:   You have increased pain, swelling, redness, fluid drainage, or bleeding.  You have muscle aches, chills, or a general ill feeling.  You have a fever. MAKE SURE YOU:   Understand these instructions.  Will watch your condition.  Will get help right away if you are not doing well or get worse. Document Released: 01/29/2005 Document Revised: 10/21/2011 Document Reviewed: 07/04/2011 [employer] Patient Information 2014 [employer].  

## 2012-12-01 NOTE — Progress Notes (Signed)
Subjective:    Patient ID: [name], male    DOB: 1934/08/01, 78 y.o.   MRN: [id number]  Wound Check He was originally treated 5 to 10 days ago. There has been colored discharge from the wound. The redness has worsened. The swelling has worsened. The pain has worsened. He has no difficulty moving the affected extremity or digit.      Review of Systems  Constitutional: Negative.  Negative for fever, chills, diaphoresis, activity change, appetite change, fatigue and unexpected weight change.  HENT: Negative.   Eyes: Negative.   Respiratory: Negative.  Negative for cough, chest tightness, shortness of breath, wheezing and stridor.   Cardiovascular: Negative.   Gastrointestinal: Negative.  Negative for nausea, vomiting, abdominal pain, diarrhea, constipation and anal bleeding.  Endocrine: Negative.  Negative for polydipsia, polyphagia and polyuria.  Genitourinary: Negative.   Musculoskeletal: Negative.  Negative for myalgias and arthralgias.  Skin: Positive for wound. Negative for color change, pallor and rash.  Allergic/Immunologic: Negative.   Neurological: Negative.   Hematological: Negative.  Negative for adenopathy. Does not bruise/bleed easily.  Psychiatric/Behavioral: Negative.        Objective:   Physical Exam  Vitals reviewed. Constitutional: He is oriented to person, place, and time. He appears well-developed and well-nourished. No distress.  HENT:  Head: Normocephalic and atraumatic.  Mouth/Throat: Oropharynx is clear and moist. No oropharyngeal exudate.  Eyes: Conjunctivae are normal. Right eye exhibits no discharge. Left eye exhibits no discharge. No scleral icterus.  Neck: Normal range of motion. Neck supple. No JVD present. No tracheal deviation present. No thyromegaly present.  Cardiovascular: Normal rate, regular rhythm, normal heart sounds and intact distal pulses.  Exam reveals no gallop and no friction rub.   No murmur heard. Pulmonary/Chest: Effort  normal and breath sounds normal. No stridor. No respiratory distress. He has no wheezes. He has no rales. He exhibits no tenderness.  Abdominal: Soft. Bowel sounds are normal. He exhibits no distension and no mass. There is no tenderness. There is no rebound and no guarding.  Musculoskeletal: Normal range of motion. He exhibits no edema and no tenderness.       Legs: Lymphadenopathy:    He has no cervical adenopathy.  Neurological: He is oriented to person, place, and time.  Skin: Skin is warm and dry. No rash noted. He is not diaphoretic. No erythema. No pallor.     The area over the Rt. FA was cleaned with betadine and prepped and draped in sterile fashion. Then local anesthesia was obtained with lido 2% with epi. A 4 mm punch incision was made and a deep cavity filled with pus was found and explored. A clx was sent. The cavity was irrigated with H2O2 than packed with iodoform. He tolerated all of this well with no blood loss or complications. A dressing was applied.  Psychiatric: He has a normal mood and affect. His behavior is normal. Judgment and thought content normal.     Lab Results  Component Value Date   WBC 6.5 07/05/2012   HGB 11.4* 07/05/2012   HCT 34.6* 07/05/2012   PLT 210.0 07/05/2012   GLUCOSE 118* 05/31/2012   CHOL 134 05/31/2012   TRIG 69.0 05/31/2012   HDL 40.20 05/31/2012   LDLCALC 80 05/31/2012   ALT 25 05/31/2012   AST 30 05/31/2012   NA 142 05/31/2012   K 4.3 05/31/2012   CL 106 05/31/2012   CREATININE 1.0 05/31/2012   BUN 15 05/31/2012   CO2 28 05/31/2012  TSH 1.10 05/31/2012   PSA 0.00* 05/31/2012   INR 1.0 07/24/2008   HGBA1C 6.8* 05/31/2012   MICROALBUR 2.9* 05/07/2010       Assessment & Plan:

## 2012-12-02 NOTE — Assessment & Plan Note (Signed)
He has had recurrent infections with MRSA and I am concerned that he may have a reservoir in his nose or under his fingernails so I will treat with rifampin + bactrim. I and D was done today, culture is pending. He will rtc in 2 days for a recheck.

## 2012-12-02 NOTE — Assessment & Plan Note (Signed)
I will recheck his A1C today 

## 2012-12-02 NOTE — Assessment & Plan Note (Signed)
His BP is well controlled 

## 2012-12-03 ENCOUNTER — Encounter: Payer: Self-pay | Admitting: Internal Medicine

## 2012-12-03 ENCOUNTER — Other Ambulatory Visit: Payer: Medicare Other

## 2012-12-03 ENCOUNTER — Ambulatory Visit (INDEPENDENT_AMBULATORY_CARE_PROVIDER_SITE_OTHER): Payer: Medicare Other | Admitting: Internal Medicine

## 2012-12-03 VITALS — BP 136/72 | HR 73 | Temp 98.5°F | Resp 16 | Wt 235.0 lb

## 2012-12-03 DIAGNOSIS — L02413 Cutaneous abscess of right upper limb: Secondary | ICD-10-CM

## 2012-12-03 DIAGNOSIS — L02419 Cutaneous abscess of limb, unspecified: Secondary | ICD-10-CM

## 2012-12-03 DIAGNOSIS — L03119 Cellulitis of unspecified part of limb: Secondary | ICD-10-CM

## 2012-12-03 NOTE — Patient Instructions (Addendum)

## 2012-12-05 LAB — WOUND CULTURE: Gram Stain: NONE SEEN

## 2012-12-05 NOTE — Assessment & Plan Note (Signed)
He is complaining of throat clearing. From the timing, it may be related to Anoro. I don't see thrush or other visible irritation. Plan-we will see if returned to [facility], using an AeroChamber, clears this complaint.

## 2012-12-06 ENCOUNTER — Encounter: Payer: Self-pay | Admitting: Internal Medicine

## 2012-12-06 ENCOUNTER — Ambulatory Visit (INDEPENDENT_AMBULATORY_CARE_PROVIDER_SITE_OTHER): Payer: Medicare Other | Admitting: Internal Medicine

## 2012-12-06 VITALS — BP 142/80 | HR 98 | Temp 97.7°F | Resp 16 | Wt 241.0 lb

## 2012-12-06 DIAGNOSIS — IMO0002 Reserved for concepts with insufficient information to code with codable children: Secondary | ICD-10-CM

## 2012-12-06 DIAGNOSIS — L02419 Cutaneous abscess of limb, unspecified: Secondary | ICD-10-CM

## 2012-12-06 DIAGNOSIS — L03119 Cellulitis of unspecified part of limb: Secondary | ICD-10-CM

## 2012-12-06 DIAGNOSIS — L02413 Cutaneous abscess of right upper limb: Secondary | ICD-10-CM

## 2012-12-06 NOTE — Progress Notes (Signed)
  Subjective:    Patient ID: [name], male    DOB: [birth year], 78 y.o.   MRN: [id number]  HPI Comments: He returns for packing removal from the right forearm but today he tells me that the pustule over the left lower anterior thigh has blossomed into an area of pain, redness, swelling.  Wound Check He was originally treated 2 to 3 days ago. Previous treatment included I&D of abscess and oral antibiotics. There has been clear discharge from the wound. The redness has improved. The swelling has improved. The pain has improved.      Review of Systems  Constitutional: Negative.  Negative for fever, chills, diaphoresis, activity change, appetite change, fatigue and unexpected weight change.  HENT: Negative.   Eyes: Negative.   Respiratory: Negative.  Negative for cough, chest tightness, shortness of breath, wheezing and stridor.   Cardiovascular: Negative.  Negative for chest pain, palpitations and leg swelling.  Gastrointestinal: Negative.   Endocrine: Negative.   Genitourinary: Negative.   Musculoskeletal: Negative.   Skin: Positive for wound.  Allergic/Immunologic: Negative.   Neurological: Negative.  Negative for dizziness, weakness and light-headedness.  Hematological: Negative.  Negative for adenopathy. Does not bruise/bleed easily.  Psychiatric/Behavioral: Negative.        Objective:   Physical Exam  Vitals reviewed. Constitutional: He is oriented to person, place, and time. He appears well-developed and well-nourished.  Non-toxic appearance. He does not have a sickly appearance. He does not appear ill. No distress.  HENT:  Head: Normocephalic and atraumatic.  Mouth/Throat: Oropharynx is clear and moist. No oropharyngeal exudate.  Eyes: Conjunctivae are normal. Right eye exhibits no discharge. Left eye exhibits no discharge. No scleral icterus.  Neck: Normal range of motion. Neck supple. No JVD present. No tracheal deviation present. No thyromegaly present.    Cardiovascular: Normal rate, regular rhythm, normal heart sounds and intact distal pulses.  Exam reveals no gallop and no friction rub.   No murmur heard. Pulmonary/Chest: Effort normal and breath sounds normal. No stridor. No respiratory distress. He has no wheezes. He has no rales. He exhibits no tenderness.  Abdominal: Soft. Bowel sounds are normal. He exhibits no distension and no mass. There is no tenderness. There is no rebound and no guarding.  Musculoskeletal: Normal range of motion. He exhibits no edema and no tenderness.       Arms:      Legs: The area over the left thigh was cleaned with betadine then prepped and draped in sterile fashion, local anesthesia was obtained with 2% lido with epi, 2 cc's were used. A 4 mm punch incision was made and a scant amount of purulent exudate was expressed, the cavity was explored and it was irrigated with H2O2 then packed with iodoform. He tolerated it well.  Lymphadenopathy:    He has no cervical adenopathy.  Neurological: He is oriented to person, place, and time.  Skin: Skin is warm and dry. No rash noted. He is not diaphoretic. No erythema. No pallor.  Psychiatric: He has a normal mood and affect. His behavior is normal. Judgment and thought content normal.          Assessment & Plan:

## 2012-12-06 NOTE — Assessment & Plan Note (Signed)
I and D was completed No Clx was sent He will continue with the current antibiotic regimen

## 2012-12-06 NOTE — Assessment & Plan Note (Signed)
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

## 2012-12-07 ENCOUNTER — Encounter: Payer: Self-pay | Admitting: Internal Medicine

## 2012-12-07 NOTE — Progress Notes (Signed)
  Subjective:    Patient ID: [name], male    DOB: 1935-02-17, 78 y.o.   MRN: [id number]  Wound Check He was originally treated 3 to 5 days ago. Previous treatment included I&D of abscess and oral antibiotics. His temperature was unmeasured prior to arrival. There has been no drainage from the wound. The redness has improved. The swelling has improved. The pain has no pain. He has no difficulty moving the affected extremity or digit.      Review of Systems  Constitutional: Negative.  Negative for fever, chills, diaphoresis and fatigue.  HENT: Negative.   Eyes: Negative.   Respiratory: Negative.   Cardiovascular: Negative.   Gastrointestinal: Negative.  Negative for abdominal pain.  Endocrine: Negative.   Genitourinary: Negative.   Musculoskeletal: Negative.   Skin: Positive for wound.  Allergic/Immunologic: Negative.   Neurological: Negative.  Negative for dizziness, weakness and light-headedness.  Hematological: Negative for adenopathy. Does not bruise/bleed easily.  Psychiatric/Behavioral: Negative.        Objective:   Physical Exam  Vitals reviewed. Constitutional: He is oriented to person, place, and time. He appears well-developed and well-nourished.  Non-toxic appearance. He does not have a sickly appearance. He does not appear ill. No distress.  HENT:  Head: Normocephalic and atraumatic.  Mouth/Throat: Oropharynx is clear and moist. No oropharyngeal exudate.  Eyes: Conjunctivae are normal. Right eye exhibits no discharge. Left eye exhibits no discharge. No scleral icterus.  Neck: Normal range of motion. Neck supple. No JVD present. No tracheal deviation present. No thyromegaly present.  Cardiovascular: Normal rate, regular rhythm, normal heart sounds and intact distal pulses.  Exam reveals no gallop and no friction rub.   No murmur heard. Pulmonary/Chest: Effort normal and breath sounds normal. No stridor. No respiratory distress. He has no wheezes. He has no  rales. He exhibits no tenderness.  Abdominal: Soft. Bowel sounds are normal. He exhibits no distension and no mass. There is no tenderness. There is no rebound and no guarding.  Musculoskeletal: Normal range of motion. He exhibits no edema and no tenderness.       Arms:      Legs: Lymphadenopathy:    He has no cervical adenopathy.  Neurological: He is oriented to person, place, and time.  Skin: Skin is warm and dry. No rash noted. He is not diaphoretic. No erythema. No pallor.  Psychiatric: He has a normal mood and affect. His behavior is normal. Judgment and thought content normal.          Assessment & Plan:

## 2012-12-07 NOTE — Assessment & Plan Note (Signed)
Improvement noted 

## 2012-12-07 NOTE — Assessment & Plan Note (Signed)
Improvement noted Will continue antibiotics to cover MRSA

## 2012-12-10 LAB — WOUND CULTURE: Gram Stain: NONE SEEN

## 2013-01-04 ENCOUNTER — Ambulatory Visit (INDEPENDENT_AMBULATORY_CARE_PROVIDER_SITE_OTHER)
Admission: RE | Admit: 2013-01-04 | Discharge: 2013-01-04 | Disposition: A | Discharge status: Home / Self Care | Payer: Medicare Other | Source: Ambulatory Visit | Attending: Internal Medicine | Admitting: Internal Medicine

## 2013-01-04 ENCOUNTER — Encounter: Payer: Self-pay | Admitting: Internal Medicine

## 2013-01-04 ENCOUNTER — Ambulatory Visit (INDEPENDENT_AMBULATORY_CARE_PROVIDER_SITE_OTHER): Payer: Medicare Other | Admitting: Internal Medicine

## 2013-01-04 VITALS — BP 140/80 | HR 95 | Ht 74.0 in | Wt 237.2 lb

## 2013-01-04 DIAGNOSIS — J449 Chronic obstructive pulmonary disease, unspecified: Secondary | ICD-10-CM

## 2013-01-04 DIAGNOSIS — J441 Chronic obstructive pulmonary disease with (acute) exacerbation: Secondary | ICD-10-CM

## 2013-01-04 NOTE — Progress Notes (Signed)
11/18/12- 77 former smoker  yoM re-establishing for COPD                            CP Dr Karie Schwalbe. [name] Has been using Anoro, Daliresp. FOLLOWS FOR: throat congestion, wheezing at night, occ prod cough with clear mucus x3days Main complaint is increased throat clearing for 6 weeks. Dr. Yetta Moran gave Daliresp, helpful but too expensive. Dulera changed to lAnoro, started shortly before throat became hoarse. Denies dysphagia or throat pain. Occasional sneeze. CXR 06/13/11 IMPRESSION:  Stable chest x-ray with slight hyperaeration. No active lung  disease.  Original Report Authenticated By: Juline Patch, M.D.  01/04/13- 40 former smoker  yoM re-establishing for COPD                            PCP Dr Karie Schwalbe. [name] FOLLOWS FOR: has been using Dulera in the mornings for the most part(trying to get the second dose in at night); denies any wheezing, cough, congestion, or SOB today. Does not want flu vaccine "made him sick". Feels well controlled now with Dulera once daily. Medications reviewed. Daliresp was too expensive.  ROS-see HPI Constitutional:   No-   weight loss, night sweats, fevers, chills, fatigue, lassitude. HEENT:   No-  headaches, difficulty swallowing, tooth/dental problems, sore throat,       No-  sneezing, itching, ear ache, nasal congestion, post nasal drip,  CV:  No-   chest pain, orthopnea, PND, swelling in lower extremities, anasarca, dizziness, palpitations Resp: + shortness of breath with exertion or at rest.              No-   productive cough,  No non-productive cough,  No- coughing up of blood.              No-   change in color of mucus.  No- wheezing.   Skin: No-   rash or lesions. GI:  No-   heartburn, indigestion, abdominal pain, nausea, vomiting, GU:  MS:  No-   joint pain or swelling.   Neuro-     nothing unusual Psych:  No- change in mood or affect. No depression or anxiety.  No memory loss.  OBJ- Physical Exam General- Alert, Oriented, Affect-appropriate, Distress- none  acute Skin- rash-none, lesions- none, excoriation- none Lymphadenopathy- none Head- atraumatic            Eyes- Gross vision intact, PERRLA, conjunctivae and secretions clear            Ears- Hearing, canals-normal            Nose- Clear, no-Septal dev, mucus, polyps, erosion, perforation             Throat- Mallampati III , mucosa clear , drainage- none, tonsils- atrophic Neck- flexible , trachea midline, no stridor , thyroid nl, carotid no bruit Chest - symmetrical excursion , unlabored           Heart/CV- RRR , no murmur , no gallop  , no rub, nl s1 s2                           - JVD- none , edema- none, stasis changes- none, varices- none           Lung- clear to P&A, wheeze- none, cough- none , dullness-none, rub- none  Chest wall-  Abd-  Br/ Gen/ Rectal- Not done, not indicated Extrem- cyanosis- none, clubbing, none, atrophy- none, strength- nl Neuro- grossly intact to observation

## 2013-01-04 NOTE — Patient Instructions (Addendum)
Ok - CXR  Dx COPD  We can continue Dulera - be quick to slide back up to 2 puffs, twice daily at first sign of trouble coming.  We can stop Daliresp for now because of cost.   I do encourage everybody to get the flu shot

## 2013-01-06 ENCOUNTER — Telehealth: Payer: Self-pay | Admitting: Internal Medicine

## 2013-01-06 NOTE — Telephone Encounter (Signed)
Called and spoke with pts wife and she stated that she was not really sure of the phone call.  She did review the cxr results that were given yesterday and she is aware of recs from OV with CY to cont the dulera and stop the daliresp.  Nothing further is needed.

## 2013-01-11 NOTE — Assessment & Plan Note (Signed)
He is not finding Daliresp cost effective. Plan-use up and stopped Daliresp. I suggested he reconsider his refusal to take flu shot. Order CXR

## 2013-03-23 ENCOUNTER — Ambulatory Visit (INDEPENDENT_AMBULATORY_CARE_PROVIDER_SITE_OTHER): Payer: Medicare Other | Admitting: Internal Medicine

## 2013-03-23 ENCOUNTER — Encounter: Payer: Self-pay | Admitting: Internal Medicine

## 2013-03-23 ENCOUNTER — Other Ambulatory Visit (INDEPENDENT_AMBULATORY_CARE_PROVIDER_SITE_OTHER): Payer: Medicare Other

## 2013-03-23 VITALS — BP 138/76 | HR 84 | Temp 98.8°F | Resp 16 | Ht 74.0 in | Wt 240.0 lb

## 2013-03-23 DIAGNOSIS — I10 Essential (primary) hypertension: Secondary | ICD-10-CM

## 2013-03-23 DIAGNOSIS — IMO0001 Reserved for inherently not codable concepts without codable children: Secondary | ICD-10-CM

## 2013-03-23 DIAGNOSIS — J441 Chronic obstructive pulmonary disease with (acute) exacerbation: Secondary | ICD-10-CM

## 2013-03-23 LAB — BASIC METABOLIC PANEL
Chloride: 103 mEq/L (ref 96–112)
Creatinine, Ser: 1.2 mg/dL (ref 0.4–1.5)
Potassium: 4.1 mEq/L (ref 3.5–5.1)
Sodium: 138 mEq/L (ref 135–145)

## 2013-03-23 LAB — HEMOGLOBIN A1C: Hgb A1c MFr Bld: 6.9 % — ABNORMAL HIGH (ref 4.6–6.5)

## 2013-03-23 NOTE — Progress Notes (Signed)
Pre visit review using our clinic review tool, if applicable. No additional management support is needed unless otherwise documented below in the visit note. 

## 2013-03-23 NOTE — Assessment & Plan Note (Signed)
I will check his A1C and will advise further 

## 2013-03-23 NOTE — Assessment & Plan Note (Signed)
He is doing well on his current regimen 

## 2013-03-23 NOTE — Assessment & Plan Note (Signed)
His BP is well controlled Today I will check his lytes and renal function 

## 2013-03-23 NOTE — Patient Instructions (Signed)
Type 2 Diabetes Mellitus, Adult Type 2 diabetes mellitus, often simply referred to as type 2 diabetes, is a long-lasting (chronic) disease. In type 2 diabetes, the pancreas does not make enough insulin (a hormone), the cells are less responsive to the insulin that is made (insulin resistance), or both. Normally, insulin moves sugars from food into the tissue cells. The tissue cells use the sugars for energy. The lack of insulin or the lack of normal response to insulin causes excess sugars to build up in the blood instead of going into the tissue cells. As a result, high blood sugar (hyperglycemia) develops. The effect of high sugar (glucose) levels can cause many complications. Type 2 diabetes was also previously called adult-onset diabetes but it can occur at any age.  RISK FACTORS  A person is predisposed to developing type 2 diabetes if someone in the family has the disease and also has one or more of the following primary risk factors:  Overweight.  An inactive lifestyle.  A history of consistently eating high-calorie foods. Maintaining a normal weight and regular physical activity can reduce the chance of developing type 2 diabetes. SYMPTOMS  A person with type 2 diabetes may not show symptoms initially. The symptoms of type 2 diabetes appear slowly. The symptoms include:  Increased thirst (polydipsia).  Increased urination (polyuria).  Increased urination during the night (nocturia).  Weight loss. This weight loss may be rapid.  Frequent, recurring infections.  Tiredness (fatigue).  Weakness.  Vision changes, such as blurred vision.  Fruity smell to your breath.  Abdominal pain.  Nausea or vomiting.  Cuts or bruises which are slow to heal.  Tingling or numbness in the hands or feet. DIAGNOSIS Type 2 diabetes is frequently not diagnosed until complications of diabetes are present. Type 2 diabetes is diagnosed when symptoms or complications are present and when blood  glucose levels are increased. Your blood glucose level may be checked by one or more of the following blood tests:  A fasting blood glucose test. You will not be allowed to eat for at least 8 hours before a blood sample is taken.  A random blood glucose test. Your blood glucose is checked at any time of the day regardless of when you ate.  A hemoglobin A1c blood glucose test. A hemoglobin A1c test provides information about blood glucose control over the previous 3 months.  An oral glucose tolerance test (OGTT). Your blood glucose is measured after you have not eaten (fasted) for 2 hours and then after you drink a glucose-containing beverage. TREATMENT   You may need to take insulin or diabetes medicine daily to keep blood glucose levels in the desired range.  You will need to match insulin dosing with exercise and healthy food choices. The treatment goal is to maintain the before meal blood sugar (preprandial glucose) level at 70 130 mg/dL. HOME CARE INSTRUCTIONS   Have your hemoglobin A1c level checked twice a year.  Perform daily blood glucose monitoring as directed by your caregiver.  Monitor urine ketones when you are ill and as directed by your caregiver.  Take your diabetes medicine or insulin as directed by your caregiver to maintain your blood glucose levels in the desired range.  Never run out of diabetes medicine or insulin. It is needed every day.  Adjust insulin based on your intake of carbohydrates. Carbohydrates can raise blood glucose levels but need to be included in your diet. Carbohydrates provide vitamins, minerals, and fiber which are an essential part of   a healthy diet. Carbohydrates are found in fruits, vegetables, whole grains, dairy products, legumes, and foods containing added sugars.    Eat healthy foods. Alternate 3 meals with 3 snacks.  Lose weight if overweight.  Carry a medical alert card or wear your medical alert jewelry.  Carry a 15 gram  carbohydrate snack with you at all times to treat low blood glucose (hypoglycemia). Some examples of 15 gram carbohydrate snacks include:  Glucose tablets, 3 or 4   Glucose gel, 15 gram tube  Raisins, 2 tablespoons (24 grams)  Jelly beans, 6  Animal crackers, 8  Regular pop, 4 ounces (120 mL)  Gummy treats, 9  Recognize hypoglycemia. Hypoglycemia occurs with blood glucose levels of 70 mg/dL and below. The risk for hypoglycemia increases when fasting or skipping meals, during or after intense exercise, and during sleep. Hypoglycemia symptoms can include:  Tremors or shakes.  Decreased ability to concentrate.  Sweating.  Increased heart rate.  Headache.  Dry mouth.  Hunger.  Irritability.  Anxiety.  Restless sleep.  Altered speech or coordination.  Confusion.  Treat hypoglycemia promptly. If you are alert and able to safely swallow, follow the 15:15 rule:  Take 15 20 grams of rapid-acting glucose or carbohydrate. Rapid-acting options include glucose gel, glucose tablets, or 4 ounces (120 mL) of fruit juice, regular soda, or low fat milk.  Check your blood glucose level 15 minutes after taking the glucose.  Take 15 20 grams more of glucose if the repeat blood glucose level is still 70 mg/dL or below.  Eat a meal or snack within 1 hour once blood glucose levels return to normal.    Be alert to polyuria and polydipsia which are early signs of hyperglycemia. An early awareness of hyperglycemia allows for prompt treatment. Treat hyperglycemia as directed by your caregiver.  Engage in at least 150 minutes of moderate-intensity physical activity a week, spread over at least 3 days of the week or as directed by your caregiver. In addition, you should engage in resistance exercise at least 2 times a week or as directed by your caregiver.  Adjust your medicine and food intake as needed if you start a new exercise or sport.  Follow your sick day plan at any time you  are unable to eat or drink as usual.  Avoid tobacco use.  Limit alcohol intake to no more than 1 drink per day for nonpregnant women and 2 drinks per day for men. You should drink alcohol only when you are also eating food. Talk with your caregiver whether alcohol is safe for you. Tell your caregiver if you drink alcohol several times a week.  Follow up with your caregiver regularly.  Schedule an eye exam soon after the diagnosis of type 2 diabetes and then annually.  Perform daily skin and foot care. Examine your skin and feet daily for cuts, bruises, redness, nail problems, bleeding, blisters, or sores. A foot exam by a caregiver should be done annually.  Brush your teeth and gums at least twice a day and floss at least once a day. Follow up with your dentist regularly.  Share your diabetes management plan with your workplace or school.  Stay up-to-date with immunizations.  Learn to manage stress.  Obtain ongoing diabetes education and support as needed.  Participate in, or seek rehabilitation as needed to maintain or improve independence and quality of life. Request a physical or occupational therapy referral if you are having foot or hand numbness or difficulties with grooming,   dressing, eating, or physical activity. SEEK MEDICAL CARE IF:   You are unable to eat food or drink fluids for more than 6 hours.  You have nausea and vomiting for more than 6 hours.  Your blood glucose level is over 240 mg/dL.  There is a change in mental status.  You develop an additional serious illness.  You have diarrhea for more than 6 hours.  You have been sick or have had a fever for a couple of days and are not getting better.  You have pain during any physical activity.  SEEK IMMEDIATE MEDICAL CARE IF:  You have difficulty breathing.  You have moderate to large ketone levels. MAKE SURE YOU:  Understand these instructions.  Will watch your condition.  Will get help right away if  you are not doing well or get worse. Document Released: 04/21/2005 Document Revised: 01/14/2012 Document Reviewed: 11/18/2011 [employer] Patient Information 2014 [employer].  

## 2013-03-23 NOTE — Progress Notes (Signed)
Subjective:    Patient ID: [name], male    DOB: 12-06-1934, 78 y.o.   MRN: [id number]  Hypertension This is a chronic problem. The current episode started more than 1 year ago. The problem has been gradually improving since onset. The problem is controlled. Pertinent negatives include no anxiety, blurred vision, chest pain, headaches, malaise/fatigue, neck pain, orthopnea, palpitations, peripheral edema, PND, shortness of breath or sweats. Past treatments include angiotensin blockers and calcium channel blockers. The current treatment provides moderate improvement. Compliance problems include exercise and diet.       Review of Systems  Constitutional: Negative.  Negative for fever, chills, malaise/fatigue, diaphoresis, appetite change and fatigue.  HENT: Negative.   Eyes: Negative.  Negative for blurred vision.  Respiratory: Negative.  Negative for apnea, cough, choking, chest tightness, shortness of breath, wheezing and stridor.   Cardiovascular: Negative.  Negative for chest pain, palpitations, orthopnea, leg swelling and PND.  Gastrointestinal: Negative.  Negative for nausea, vomiting, abdominal pain, diarrhea, constipation and blood in stool.  Endocrine: Negative.  Negative for polydipsia, polyphagia and polyuria.  Genitourinary: Negative.   Musculoskeletal: Negative.  Negative for arthralgias, joint swelling, myalgias, neck pain and neck stiffness.  Skin: Negative.   Allergic/Immunologic: Negative.   Neurological: Negative.  Negative for dizziness, tremors, seizures, syncope, facial asymmetry, speech difficulty, weakness, light-headedness, numbness and headaches.  Hematological: Negative.  Negative for adenopathy. Does not bruise/bleed easily.  Psychiatric/Behavioral: Negative.        Objective:   Physical Exam  Vitals reviewed. Constitutional: He is oriented to person, place, and time. He appears well-developed and well-nourished. No distress.  HENT:  Head:  Normocephalic and atraumatic.  Mouth/Throat: Oropharynx is clear and moist. No oropharyngeal exudate.  Eyes: Conjunctivae are normal. Right eye exhibits no discharge. Left eye exhibits no discharge. No scleral icterus.  Neck: Normal range of motion. Neck supple. No JVD present. No tracheal deviation present. No thyromegaly present.  Cardiovascular: Normal rate, regular rhythm, normal heart sounds and intact distal pulses.  Exam reveals no gallop and no friction rub.   No murmur heard. Pulmonary/Chest: Effort normal and breath sounds normal. No stridor. No respiratory distress. He has no wheezes. He has no rales. He exhibits no tenderness.  Abdominal: Soft. Bowel sounds are normal. He exhibits no distension and no mass. There is no tenderness. There is no rebound and no guarding.  Musculoskeletal: Normal range of motion. He exhibits no edema and no tenderness.  Lymphadenopathy:    He has no cervical adenopathy.  Neurological: He is oriented to person, place, and time.  Skin: Skin is warm and dry. No rash noted. He is not diaphoretic. No erythema. No pallor.  Psychiatric: He has a normal mood and affect. His behavior is normal. Judgment and thought content normal.     Lab Results  Component Value Date   WBC 6.5 07/05/2012   HGB 11.4* 07/05/2012   HCT 34.6* 07/05/2012   PLT 210.0 07/05/2012   GLUCOSE 98 12/01/2012   CHOL 134 05/31/2012   TRIG 69.0 05/31/2012   HDL 40.20 05/31/2012   LDLCALC 80 05/31/2012   ALT 25 05/31/2012   AST 30 05/31/2012   NA 141 12/01/2012   K 4.2 12/01/2012   CL 109 12/01/2012   CREATININE 1.0 12/01/2012   BUN 11 12/01/2012   CO2 27 12/01/2012   TSH 1.10 05/31/2012   PSA 0.00* 05/31/2012   INR 1.0 07/24/2008   HGBA1C 7.1* 12/01/2012   MICROALBUR 2.9* 05/07/2010  Assessment & Plan:

## 2013-04-20 ENCOUNTER — Other Ambulatory Visit: Payer: Self-pay | Admitting: Internal Medicine

## 2013-05-16 ENCOUNTER — Other Ambulatory Visit: Payer: Self-pay | Admitting: Internal Medicine

## 2013-07-05 ENCOUNTER — Ambulatory Visit: Payer: Medicare Other | Admitting: Internal Medicine

## 2013-07-22 ENCOUNTER — Ambulatory Visit: Payer: Medicare Other | Admitting: Internal Medicine

## 2013-07-27 ENCOUNTER — Encounter: Payer: Self-pay | Admitting: Internal Medicine

## 2013-07-27 ENCOUNTER — Other Ambulatory Visit: Payer: Self-pay | Admitting: Internal Medicine

## 2013-08-01 ENCOUNTER — Telehealth: Payer: Self-pay | Admitting: Internal Medicine

## 2013-08-01 MED ORDER — MOMETASONE FURO-FORMOTEROL FUM 100-5 MCG/ACT IN AERO: INHALATION_SPRAY | RESPIRATORY_TRACT | Status: DC

## 2013-08-01 NOTE — Telephone Encounter (Signed)
Spouse aware RX has been called in. Nothing further needed

## 2013-08-08 ENCOUNTER — Encounter: Payer: Self-pay | Admitting: Internal Medicine

## 2013-08-08 ENCOUNTER — Ambulatory Visit (INDEPENDENT_AMBULATORY_CARE_PROVIDER_SITE_OTHER): Payer: Commercial Managed Care - HMO | Admitting: Internal Medicine

## 2013-08-08 VITALS — BP 136/64 | HR 8 | Ht 74.0 in | Wt 239.4 lb

## 2013-08-08 DIAGNOSIS — J449 Chronic obstructive pulmonary disease, unspecified: Secondary | ICD-10-CM

## 2013-08-08 DIAGNOSIS — J4489 Other specified chronic obstructive pulmonary disease: Secondary | ICD-10-CM

## 2013-08-08 MED ORDER — THEOPHYLLINE ER 200 MG PO TB12: ORAL_TABLET | ORAL | Status: DC

## 2013-08-08 NOTE — Patient Instructions (Signed)
Try adding theophylline 200 mg twice daily with meals. Script sent.

## 2013-08-08 NOTE — Progress Notes (Signed)
11/18/12- 24 former smoker  yoM re-establishing for COPD                            CP Dr Darene Lamer. Ronnald Ramp Has been using Anoro, Daliresp. FOLLOWS FOR: throat congestion, wheezing at night, occ prod cough with clear mucus x3days Main complaint is increased throat clearing for 6 weeks. Dr. Ronnald Ramp gave Daliresp, helpful but too expensive. Dulera changed to lAnoro, started shortly before throat became hoarse. Denies dysphagia or throat pain. Occasional sneeze. CXR 06/13/11 IMPRESSION:  Stable chest x-ray with slight hyperaeration. No active lung  disease.  Original Report Authenticated By: Joretta Bachelor, M.D.  01/04/13- 50 former smoker  yoM re-establishing for COPD                            PCP Dr Darene Lamer. Ronnald Ramp FOLLOWS FOR: has been using Dulera in the mornings for the most part(trying to get the second dose in at night); denies any wheezing, cough, congestion, or SOB today. Does not want flu vaccine "made him sick". Feels well controlled now with Dulera once daily. Medications reviewed. Daliresp was too expensive.  08/08/13- 14 former smoker  yoM re-establishing for COPD                            PCP Dr Darene Lamer. Ronnald Ramp ACUTE VISIT: continues to have "tickle" cough/congestion in back of throat. When lying down at night has to clear throat alot. Upper airway rattle/tickle cough lying down. Some dyspnea on exertion. Daliresp was too expensive. Using [facility]. CXR 01/05/13 IMPRESSION:  Underlying emphysema. No edema or consolidation.  Original Report Authenticated By: Lowella Grip, M.D.  ROS-see HPI Constitutional:   No-   weight loss, night sweats, fevers, chills, fatigue, lassitude. HEENT:   No-  headaches, difficulty swallowing, tooth/dental problems, sore throat,       No-  sneezing, itching, ear ache, nasal congestion, post nasal drip,  CV:  No-   chest pain, orthopnea, PND, swelling in lower extremities, anasarca, dizziness, palpitations Resp: + shortness of breath with exertion or at rest.              No-    productive cough,  + non-productive cough,  No- coughing up of blood.              No-   change in color of mucus.  No- wheezing.   Skin: No-   rash or lesions. GI:  No-   heartburn, indigestion, abdominal pain, nausea, vomiting, GU:  MS:  No-   joint pain or swelling.   Neuro-     nothing unusual Psych:  No- change in mood or affect. No depression or anxiety.  No memory loss.  OBJ- Physical Exam General- Alert, Oriented, Affect-appropriate, Distress- none acute Skin- rash-none, lesions- none, excoriation- none Lymphadenopathy- none Head- atraumatic            Eyes- Gross vision intact, PERRLA, conjunctivae and secretions clear            Ears- Hearing, canals-normal            Nose- Clear, no-Septal dev, mucus, polyps, erosion, perforation             Throat- Mallampati III , mucosa clear , drainage- none, tonsils- atrophic Neck- flexible , trachea midline, no stridor , thyroid nl, carotid no bruit Chest - symmetrical  excursion , unlabored           Heart/CV- RRR , no murmur , no gallop  , no rub, nl s1 s2                           - JVD- none , edema- none, stasis changes- none, varices- none           Lung- +rhonchi, wheeze- none, cough- none , dullness-none, rub- none           Chest wall-  Abd-  Br/ Gen/ Rectal- Not done, not indicated Extrem- cyanosis- none, clubbing, none, atrophy- none, strength- nl Neuro- grossly intact to observation

## 2013-09-04 NOTE — Assessment & Plan Note (Signed)
Chronic bronchitis with persistent cough and rattle. Plan-try theophylline

## 2013-09-13 ENCOUNTER — Encounter: Payer: Self-pay | Admitting: Internal Medicine

## 2013-09-14 ENCOUNTER — Telehealth: Payer: Self-pay | Admitting: Internal Medicine

## 2013-09-14 NOTE — Telephone Encounter (Signed)
Rec'd from [facility] forward 2 pages to [place]

## 2013-09-26 ENCOUNTER — Encounter: Payer: Self-pay | Admitting: Internal Medicine

## 2013-10-28 ENCOUNTER — Ambulatory Visit (AMBULATORY_SURGERY_CENTER): Payer: Commercial Managed Care - HMO | Admitting: *Deleted

## 2013-10-28 VITALS — Ht 74.0 in | Wt 242.0 lb

## 2013-10-28 DIAGNOSIS — Z86010 Personal history of colonic polyps: Secondary | ICD-10-CM

## 2013-10-28 MED ORDER — MOVIPREP 100 G PO SOLR: ORAL | Status: DC

## 2013-10-28 NOTE — Progress Notes (Signed)
No allergies to eggs or soy. No problems with anesthesia.  Pt given Emmi instructions for colonoscopy  No oxygen use  No diet drug use  

## 2013-11-09 ENCOUNTER — Ambulatory Visit (AMBULATORY_SURGERY_CENTER): Payer: Commercial Managed Care - HMO | Admitting: Internal Medicine

## 2013-11-09 ENCOUNTER — Encounter: Payer: Self-pay | Admitting: Internal Medicine

## 2013-11-09 ENCOUNTER — Encounter: Payer: Commercial Managed Care - HMO | Admitting: Internal Medicine

## 2013-11-09 ENCOUNTER — Telehealth: Payer: Self-pay | Admitting: *Deleted

## 2013-11-09 VITALS — BP 134/70 | HR 58 | Temp 97.4°F | Resp 21 | Ht 74.0 in | Wt 242.0 lb

## 2013-11-09 DIAGNOSIS — Z86010 Personal history of colonic polyps: Secondary | ICD-10-CM

## 2013-11-09 DIAGNOSIS — D126 Benign neoplasm of colon, unspecified: Secondary | ICD-10-CM

## 2013-11-09 MED ORDER — SODIUM CHLORIDE 0.9 % IV SOLN: 500.0000 mL | INTRAVENOUS | Status: DC

## 2013-11-09 NOTE — Progress Notes (Signed)
Report to PACU, RN, vss, BBS= Clear.  

## 2013-11-09 NOTE — Telephone Encounter (Signed)
Message copied by Hulan Saas on Wed Nov 09, 2013  1:54 PM ------      Message from: Lafayette Dragon      Created: Wed Nov 09, 2013  1:26 PM      Regarding: RE: referral to cardiology       Rollene Fare, can you, please, call pt to tell him Dr Ronnald Ramp would like to see him in the office first before being referred to Dr Claiborne Billings. Thanx. DB      ----- Message -----         From: Janith Lima, MD         Sent: 11/09/2013   9:35 AM           To: Lafayette Dragon, MD      Subject: RE: referral to cardiology                               Please have him come see me,            Thanks, Alona Bene      ----- Message -----         From: Lafayette Dragon, MD         Sent: 11/09/2013   9:15 AM           To: Janith Lima, MD      Subject: referral to cardiology                                   [name], [name] had colonoscopy today and had  Wide QRS complex, r/o LBBB, he has never seen a cardiologist. GHis wife sees Dr Ellouise Newer. Would you, please, consider referring him to Dr Claiborne Billings? Thanx. Dora ( I spoke to them after the procedure)             ------

## 2013-11-09 NOTE — Op Note (Signed)
[place]  [address] [place], [id number]   COLONOSCOPY PROCEDURE REPORT  PATIENT: [name], [name]  MR#: [id number] BIRTHDATE: Jul 16, 1934 , 78  yrs. old GENDER: Male ENDOSCOPIST: Lafayette Dragon, MD REFERRED BY:Dr Scarlette Calico PROCEDURE DATE:  11/09/2013 PROCEDURE:   Colonoscopy with cold biopsy polypectomy and Colonoscopy with snare polypectomy First Screening Colonoscopy - Avg.  risk and is 50 yrs.  old or older - No.  Prior Negative Screening - Now for repeat screening. N/A  History of Adenoma - Now for follow-up colonoscopy & has been > or = to 3 yrs.  Yes hx of adenoma.  Has been 3 or more years since last colonoscopy.  Polyps Removed Today? Yes. ASA CLASS:   Class III INDICATIONS:colonoscopy in June 2000 and..  2 hyperplastic and 2 adenomatous polyps removed. MEDICATIONS: MAC sedation, administered by CRNA and propofol (Diprivan) 200mg  IV  DESCRIPTION OF PROCEDURE:   After the risks benefits and alternatives of the procedure were thoroughly explained, informed consent was obtained.  A digital rectal exam revealed no abnormalities of the rectum.   The LB [id number]  endoscope was introduced through the anus and advanced to the cecum, which was identified by both the appendix and ileocecal valve. No adverse events experienced.   The quality of the prep was good, using MoviPrep  The instrument was then slowly withdrawn as the colon was fully examined.      COLON FINDINGS: Five polypoid shaped sessile polyps ranging between 5-76mm in size were found throughout the entire examined colon. 3 polyp sin ascending colon and 2 polyps at 50 cm.  A polypectomy was performed with cold forceps, x2 at 50 cm,  with a cold snare and using snare cautery. x 3  The resection was complete and the polyp tissue was completely retrieved.   Mild diverticulosis was noted. Retroflexed views revealed no abnormalities. The time to cecum=5 minutes 47 seconds.   Withdrawal time=13 minutes 11 seconds.  The scope was withdrawn and the procedure completed. COMPLICATIONS: There were no complications.  ENDOSCOPIC IMPRESSION: 1.   Five sessile polyps ranging between 5-74mm in size were found throughout the entire examined colon; polypectomy was performed with cold forceps, with a cold snare and using snare cautery 2.   Mild diverticulosis was noted  RECOMMENDATIONS: 1.  Await pathology results 2.  high-fiber diet Recall colonoscopy pending path report   eSigned:  Lafayette Dragon, MD 11/09/2013 9:10 AM   cc:   PATIENT NAME:  [name], [name] MR#: [id number]

## 2013-11-09 NOTE — Telephone Encounter (Signed)
Spoke with patient and gave him the recommendation.

## 2013-11-09 NOTE — Patient Instructions (Signed)
Discharge instructions given with verbal understanding. Handouts on polyps and diverticulosis. Resume previous medications. YOU HAD AN ENDOSCOPIC PROCEDURE TODAY AT THE [facility]: Refer to the procedure report that was given to you for any specific questions about what was found during the examination.  If the procedure report does not answer your questions, please call your gastroenterologist to clarify.  If you requested that your care partner not be given the details of your procedure findings, then the procedure report has been included in a sealed envelope for you to review at your convenience later.  YOU SHOULD EXPECT: Some feelings of bloating in the abdomen. Passage of more gas than usual.  Walking can help get rid of the air that was put into your GI tract during the procedure and reduce the bloating. If you had a lower endoscopy (such as a colonoscopy or flexible sigmoidoscopy) you may notice spotting of blood in your stool or on the toilet paper. If you underwent a bowel prep for your procedure, then you may not have a normal bowel movement for a few days.  DIET: Your first meal following the procedure should be a light meal and then it is ok to progress to your normal diet.  A half-sandwich or bowl of soup is an example of a good first meal.  Heavy or fried foods are harder to digest and may make you feel nauseous or bloated.  Likewise meals heavy in dairy and vegetables can cause extra gas to form and this can also increase the bloating.  Drink plenty of fluids but you should avoid alcoholic beverages for 24 hours.  ACTIVITY: Your care partner should take you home directly after the procedure.  You should plan to take it easy, moving slowly for the rest of the day.  You can resume normal activity the day after the procedure however you should NOT DRIVE or use heavy machinery for 24 hours (because of the sedation medicines used during the test).    SYMPTOMS TO REPORT  IMMEDIATELY: A gastroenterologist can be reached at any hour.  During normal business hours, 8:30 AM to 5:00 PM Monday through Friday, call [phone].  After hours and on weekends, please call the GI answering service at [phone] who will take a message and have the physician on call contact you.   Following lower endoscopy (colonoscopy or flexible sigmoidoscopy):  Excessive amounts of blood in the stool  Significant tenderness or worsening of abdominal pains  Swelling of the abdomen that is new, acute  Fever of 100F or higher  FOLLOW UP: If any biopsies were taken you will be contacted by phone or by letter within the next 1-3 weeks.  Call your gastroenterologist if you have not heard about the biopsies in 3 weeks.  Our staff will call the home number listed on your records the next business day following your procedure to check on you and address any questions or concerns that you may have at that time regarding the information given to you following your procedure. This is a courtesy call and so if there is no answer at the home number and we have not heard from you through the emergency physician on call, we will assume that you have returned to your regular daily activities without incident.  SIGNATURES/CONFIDENTIALITY: You and/or your care partner have signed paperwork which will be entered into your electronic medical record.  These signatures attest to the fact that that the information above on your After Visit Summary   has been reviewed and is understood.  Full responsibility of the confidentiality of this discharge information lies with you and/or your care-partner. 

## 2013-11-09 NOTE — Progress Notes (Signed)
Called to room to assist during endoscopic procedure.  Patient ID and intended procedure confirmed with present staff. Received instructions for my participation in the procedure from the performing physician.  

## 2013-11-10 ENCOUNTER — Telehealth: Payer: Self-pay | Admitting: *Deleted

## 2013-11-10 NOTE — Telephone Encounter (Signed)
  Follow up Call-  Call back number 11/09/2013  Post procedure Call Back phone  # [phone]  Permission to leave phone message Yes     Patient questions:  Do you have a fever, pain , or abdominal swelling? No. Pain Score  0 *  Have you tolerated food without any problems? Yes.    Have you been able to return to your normal activities? Yes.    Do you have any questions about your discharge instructions: Diet   No. Medications  No. Follow up visit  No.  Do you have questions or concerns about your Care? No.  Actions: * If pain score is 4 or above: No action needed, pain <4.

## 2013-11-14 ENCOUNTER — Encounter: Payer: Self-pay | Admitting: Internal Medicine

## 2013-11-15 ENCOUNTER — Encounter: Payer: Self-pay | Admitting: Internal Medicine

## 2013-11-15 ENCOUNTER — Other Ambulatory Visit (INDEPENDENT_AMBULATORY_CARE_PROVIDER_SITE_OTHER): Payer: Commercial Managed Care - HMO

## 2013-11-15 ENCOUNTER — Ambulatory Visit (INDEPENDENT_AMBULATORY_CARE_PROVIDER_SITE_OTHER): Payer: Commercial Managed Care - HMO | Admitting: Internal Medicine

## 2013-11-15 VITALS — BP 142/78 | HR 88 | Temp 98.4°F | Resp 16 | Ht 74.0 in | Wt 240.8 lb

## 2013-11-15 DIAGNOSIS — R9431 Abnormal electrocardiogram [ECG] [EKG]: Secondary | ICD-10-CM

## 2013-11-15 DIAGNOSIS — E1165 Type 2 diabetes mellitus with hyperglycemia: Principal | ICD-10-CM

## 2013-11-15 DIAGNOSIS — I10 Essential (primary) hypertension: Secondary | ICD-10-CM

## 2013-11-15 DIAGNOSIS — I447 Left bundle-branch block, unspecified: Secondary | ICD-10-CM

## 2013-11-15 DIAGNOSIS — D649 Anemia, unspecified: Secondary | ICD-10-CM

## 2013-11-15 DIAGNOSIS — IMO0001 Reserved for inherently not codable concepts without codable children: Secondary | ICD-10-CM

## 2013-11-15 DIAGNOSIS — E785 Hyperlipidemia, unspecified: Secondary | ICD-10-CM

## 2013-11-15 DIAGNOSIS — Z23 Encounter for immunization: Secondary | ICD-10-CM

## 2013-11-15 LAB — COMPREHENSIVE METABOLIC PANEL
ALBUMIN: 3.6 g/dL (ref 3.5–5.2)
ALT: 19 U/L (ref 0–53)
AST: 24 U/L (ref 0–37)
Alkaline Phosphatase: 104 U/L (ref 39–117)
BUN: 11 mg/dL (ref 6–23)
CALCIUM: 9.2 mg/dL (ref 8.4–10.5)
CHLORIDE: 104 meq/L (ref 96–112)
CO2: 28 meq/L (ref 19–32)
CREATININE: 0.9 mg/dL (ref 0.4–1.5)
GFR: 101.43 mL/min (ref >60.00)
GLUCOSE: 108 mg/dL — AB (ref 70–99)
Potassium: 3.8 mEq/L (ref 3.5–5.1)
Sodium: 139 mEq/L (ref 135–145)
Total Bilirubin: 0.4 mg/dL (ref 0.2–1.2)
Total Protein: 7.2 g/dL (ref 6.0–8.3)

## 2013-11-15 LAB — HEMOGLOBIN A1C: Hgb A1c MFr Bld: 6.9 % — ABNORMAL HIGH (ref 4.6–6.5)

## 2013-11-15 LAB — CBC WITH DIFFERENTIAL/PLATELET
BASOS ABS: 0 10*3/uL (ref 0.0–0.1)
Basophils Relative: 0.4 % (ref 0.0–3.0)
Eosinophils Absolute: 0 10*3/uL (ref 0.0–0.7)
Eosinophils Relative: 1.2 % (ref 0.0–5.0)
HCT: 35.4 % — ABNORMAL LOW (ref 39.0–52.0)
Hemoglobin: 11.4 g/dL — ABNORMAL LOW (ref 13.0–17.0)
LYMPHS PCT: 31.5 % (ref 12.0–46.0)
Lymphs Abs: 1.2 10*3/uL (ref 0.7–4.0)
MCHC: 32.1 g/dL (ref 30.0–36.0)
MCV: 77.4 fl — ABNORMAL LOW (ref 78.0–100.0)
MONOS PCT: 12 % (ref 3.0–12.0)
Monocytes Absolute: 0.5 10*3/uL (ref 0.1–1.0)
NEUTROS ABS: 2.1 10*3/uL (ref 1.4–7.7)
NEUTROS PCT: 54.9 % (ref 43.0–77.0)
Platelets: 329 10*3/uL (ref 150.0–400.0)
RBC: 4.57 Mil/uL (ref 4.22–5.81)
RDW: 18.3 % — ABNORMAL HIGH (ref 11.5–15.5)
WBC: 3.8 10*3/uL — ABNORMAL LOW (ref 4.0–10.5)

## 2013-11-15 LAB — LIPID PANEL
CHOLESTEROL: 129 mg/dL (ref 0–200)
HDL: 42.2 mg/dL (ref >39.00)
LDL Cholesterol: 71 mg/dL (ref 0–99)
NonHDL: 86.8
Total CHOL/HDL Ratio: 3
Triglycerides: 81 mg/dL (ref 0.0–149.0)
VLDL: 16.2 mg/dL (ref 0.0–40.0)

## 2013-11-15 LAB — BRAIN NATRIURETIC PEPTIDE: Pro B Natriuretic peptide (BNP): 151 pg/mL — ABNORMAL HIGH (ref 0.0–100.0)

## 2013-11-15 LAB — TSH: TSH: 1.41 u[IU]/mL (ref 0.35–4.50)

## 2013-11-15 NOTE — Assessment & Plan Note (Signed)
His BP is adequately well controlled and his lytes and renal function are normal

## 2013-11-15 NOTE — Progress Notes (Signed)
Pre visit review using our clinic review tool, if applicable. No additional management support is needed unless otherwise documented below in the visit note. 

## 2013-11-15 NOTE — Progress Notes (Signed)
Subjective:    Patient ID: [name], male    DOB: 1934/09/03, 79 y.o.   MRN: [id number]  Hypertension This is a chronic problem. The current episode started more than 1 year ago. The problem is unchanged. The problem is controlled. Associated symptoms include peripheral edema. Pertinent negatives include no anxiety, blurred vision, chest pain, headaches, malaise/fatigue, neck pain, orthopnea, palpitations, PND, shortness of breath or sweats. There are no associated agents to hypertension. Past treatments include calcium channel blockers. The current treatment provides moderate improvement. Compliance problems include exercise and diet.  Hypertensive end-organ damage includes left ventricular hypertrophy.      Review of Systems  Constitutional: Negative.  Negative for fever, chills, malaise/fatigue, diaphoresis, activity change, appetite change, fatigue and unexpected weight change.  HENT: Negative.   Eyes: Negative.  Negative for blurred vision.  Respiratory: Negative.  Negative for apnea, cough, choking, chest tightness, shortness of breath, wheezing and stridor.   Cardiovascular: Positive for leg swelling. Negative for chest pain, palpitations, orthopnea and PND.  Gastrointestinal: Negative.  Negative for nausea, vomiting, abdominal pain, diarrhea, constipation and blood in stool.  Endocrine: Negative.  Negative for polydipsia, polyphagia and polyuria.  Genitourinary: Negative.   Musculoskeletal: Negative.  Negative for arthralgias, back pain, gait problem, joint swelling, myalgias, neck pain and neck stiffness.  Skin: Negative.  Negative for rash.  Allergic/Immunologic: Negative.   Neurological: Negative.  Negative for dizziness, tremors, seizures, syncope, facial asymmetry, speech difficulty, weakness, light-headedness, numbness and headaches.  Hematological: Negative.  Negative for adenopathy. Does not bruise/bleed easily.  Psychiatric/Behavioral: Negative.          Objective:   Physical Exam  Vitals reviewed. Constitutional: He is oriented to person, place, and time. He appears well-developed and well-nourished. No distress.  HENT:  Head: Normocephalic and atraumatic.  Mouth/Throat: Oropharynx is clear and moist. No oropharyngeal exudate.  Eyes: Conjunctivae are normal. Right eye exhibits no discharge. Left eye exhibits no discharge. No scleral icterus.  Neck: Normal range of motion. Neck supple. No JVD present. No tracheal deviation present. No thyromegaly present.  Cardiovascular: Normal rate, regular rhythm, normal heart sounds and intact distal pulses.  Exam reveals no gallop and no friction rub.   No murmur heard. Pulmonary/Chest: Effort normal and breath sounds normal. No stridor. No respiratory distress. He has no wheezes. He has no rales. He exhibits no tenderness.  Abdominal: Soft. Bowel sounds are normal. He exhibits no distension and no mass. There is no tenderness. There is no rebound and no guarding.  Musculoskeletal: Normal range of motion. He exhibits edema (2+ pitting edema in BLE). He exhibits no tenderness.  Lymphadenopathy:    He has no cervical adenopathy.  Neurological: He is oriented to person, place, and time.  Skin: Skin is warm and dry. No rash noted. He is not diaphoretic. No erythema. No pallor.  Psychiatric: He has a normal mood and affect. His behavior is normal. Judgment and thought content normal.     Lab Results  Component Value Date   WBC 6.5 07/05/2012   HGB 11.4* 07/05/2012   HCT 34.6* 07/05/2012   PLT 210.0 07/05/2012   GLUCOSE 104* 03/23/2013   CHOL 134 05/31/2012   TRIG 69.0 05/31/2012   HDL 40.20 05/31/2012   LDLCALC 80 05/31/2012   ALT 25 05/31/2012   AST 30 05/31/2012   NA 138 03/23/2013   K 4.1 03/23/2013   CL 103 03/23/2013   CREATININE 1.2 03/23/2013   BUN 13 03/23/2013   CO2 29 03/23/2013  TSH 1.10 05/31/2012   PSA 0.00* 05/31/2012   INR 1.0 07/24/2008   HGBA1C 6.9* 03/23/2013   MICROALBUR 2.9*  05/07/2010       Assessment & Plan:

## 2013-11-15 NOTE — Assessment & Plan Note (Signed)
His blood sugars are well controlled 

## 2013-11-15 NOTE — Patient Instructions (Signed)
Type 2 Diabetes Mellitus, Adult Type 2 diabetes mellitus, often simply referred to as type 2 diabetes, is a long-lasting (chronic) disease. In type 2 diabetes, the pancreas does not make enough insulin (a hormone), the cells are less responsive to the insulin that is made (insulin resistance), or both. Normally, insulin moves sugars from food into the tissue cells. The tissue cells use the sugars for energy. The lack of insulin or the lack of normal response to insulin causes excess sugars to build up in the blood instead of going into the tissue cells. As a result, high blood sugar (hyperglycemia) develops. The effect of high sugar (glucose) levels can cause many complications. Type 2 diabetes was also previously called adult-onset diabetes but it can occur at any age.  RISK FACTORS  A person is predisposed to developing type 2 diabetes if someone in the family has the disease and also has one or more of the following primary risk factors:  Overweight.  An inactive lifestyle.  A history of consistently eating high-calorie foods. Maintaining a normal weight and regular physical activity can reduce the chance of developing type 2 diabetes. SYMPTOMS  A person with type 2 diabetes may not show symptoms initially. The symptoms of type 2 diabetes appear slowly. The symptoms include:  Increased thirst (polydipsia).  Increased urination (polyuria).  Increased urination during the night (nocturia).  Weight loss. This weight loss may be rapid.  Frequent, recurring infections.  Tiredness (fatigue).  Weakness.  Vision changes, such as blurred vision.  Fruity smell to your breath.  Abdominal pain.  Nausea or vomiting.  Cuts or bruises which are slow to heal.  Tingling or numbness in the hands or feet. DIAGNOSIS Type 2 diabetes is frequently not diagnosed until complications of diabetes are present. Type 2 diabetes is diagnosed when symptoms or complications are present and when blood  glucose levels are increased. Your blood glucose level may be checked by one or more of the following blood tests:  A fasting blood glucose test. You will not be allowed to eat for at least 8 hours before a blood sample is taken.  A random blood glucose test. Your blood glucose is checked at any time of the day regardless of when you ate.  A hemoglobin A1c blood glucose test. A hemoglobin A1c test provides information about blood glucose control over the previous 3 months.  An oral glucose tolerance test (OGTT). Your blood glucose is measured after you have not eaten (fasted) for 2 hours and then after you drink a glucose-containing beverage. TREATMENT   You may need to take insulin or diabetes medicine daily to keep blood glucose levels in the desired range.  If you use insulin, you may need to adjust the dosage depending on the carbohydrates that you eat with each meal or snack. The treatment goal is to maintain the before meal blood sugar (preprandial glucose) level at 70-130 mg/dL. HOME CARE INSTRUCTIONS   Have your hemoglobin A1c level checked twice a year.  Perform daily blood glucose monitoring as directed by your health care provider.  Monitor urine ketones when you are ill and as directed by your health care provider.  Take your diabetes medicine or insulin as directed by your health care provider to maintain your blood glucose levels in the desired range.  Never run out of diabetes medicine or insulin. It is needed every day.  If you are using insulin, you may need to adjust the amount of insulin given based on your intake   of carbohydrates. Carbohydrates can raise blood glucose levels but need to be included in your diet. Carbohydrates provide vitamins, minerals, and fiber which are an essential part of a healthy diet. Carbohydrates are found in fruits, vegetables, whole grains, dairy products, legumes, and foods containing added sugars.  Eat healthy foods. You should make an  appointment to see a registered dietitian to help you create an eating plan that is right for you.  Lose weight if overweight.  Carry a medical alert card or wear your medical alert jewelry.  Carry a 15 gram carbohydrate snack with you at all times to treat low blood glucose (hypoglycemia). Some examples of 15 gram carbohydrate snacks include:  Glucose tablets, 3 or 4  Raisins, 2 tablespoons (24 grams)  Jelly beans, 6  Animal crackers, 8  Regular pop, 4 ounces (120 mL)  Gummy treats, 9  Recognize hypoglycemia. Hypoglycemia occurs with blood glucose levels of 70 mg/dL and below. The risk for hypoglycemia increases when fasting or skipping meals, during or after intense exercise, and during sleep. Hypoglycemia symptoms can include:  Tremors or shakes.  Decreased ability to concentrate.  Sweating.  Increased heart rate.  Headache.  Dry mouth.  Hunger.  Irritability.  Anxiety.  Restless sleep.  Altered speech or coordination.  Confusion.  Treat hypoglycemia promptly. If you are alert and able to safely swallow, follow the 15:15 rule:  Take 15-20 grams of rapid-acting glucose or carbohydrate. Rapid-acting options include glucose gel, glucose tablets, or 4 ounces (120 mL) of fruit juice, regular soda, or low fat milk.  Check your blood glucose level 15 minutes after taking the glucose.  Take 15-20 grams more of glucose if the repeat blood glucose level is still 70 mg/dL or below.  Eat a meal or snack within 1 hour once blood glucose levels return to normal.  Be alert to feeling very thirsty and urinating more frequently than usual, which are early signs of hyperglycemia. An early awareness of hyperglycemia allows for prompt treatment. Treat hyperglycemia as directed by your health care provider.  Engage in at least 150 minutes of moderate-intensity physical activity a week, spread over at least 3 days of the week or as directed by your health care provider. In  addition, you should engage in resistance exercise at least 2 times a week or as directed by your health care provider.  Adjust your medicine and food intake as needed if you start a new exercise or sport.  Follow your sick day plan at any time you are unable to eat or drink as usual.  Avoid tobacco use.  Limit alcohol intake to no more than 1 drink per day for nonpregnant women and 2 drinks per day for men. You should drink alcohol only when you are also eating food. Talk with your health care provider whether alcohol is safe for you. Tell your health care provider if you drink alcohol several times a week.  Follow up with your health care provider regularly.  Schedule an eye exam soon after the diagnosis of type 2 diabetes and then annually.  Perform daily skin and foot care. Examine your skin and feet daily for cuts, bruises, redness, nail problems, bleeding, blisters, or sores. A foot exam by a health care provider should be done annually.  Brush your teeth and gums at least twice a day and floss at least once a day. Follow up with your dentist regularly.  Share your diabetes management plan with your workplace or school.  Stay up-to-date with   immunizations.  Learn to manage stress.  Obtain ongoing diabetes education and support as needed.  Participate in, or seek rehabilitation as needed to maintain or improve independence and quality of life. Request a physical or occupational therapy referral if you are having foot or hand numbness or difficulties with grooming, dressing, eating, or physical activity. SEEK MEDICAL CARE IF:   You are unable to eat food or drink fluids for more than 6 hours.  You have nausea and vomiting for more than 6 hours.  Your blood glucose level is over 240 mg/dL.  There is a change in mental status.  You develop an additional serious illness.  You have diarrhea for more than 6 hours.  You have been sick or have had a fever for a couple of days  and are not getting better.  You have pain during any physical activity.  SEEK IMMEDIATE MEDICAL CARE IF:  You have difficulty breathing.  You have moderate to large ketone levels. MAKE SURE YOU:  Understand these instructions.  Will watch your condition.  Will get help right away if you are not doing well or get worse. Document Released: 04/21/2005 Document Revised: 04/26/2013 Document Reviewed: 11/18/2011 ExitCare Patient Information 2015 [employer]. This information is not intended to replace advice given to you by your health care provider. Make sure you discuss any questions you have with your health care provider.  

## 2013-11-15 NOTE — Assessment & Plan Note (Signed)
He has achieved his LDL goal and is doing well on lipitor

## 2013-11-15 NOTE — Assessment & Plan Note (Signed)
He complains of edema and his BNP is very slightly elevated I have asked him to see cardiology for further evaluation

## 2013-11-17 ENCOUNTER — Encounter: Payer: Self-pay | Admitting: *Deleted

## 2013-11-17 LAB — HM COLONOSCOPY

## 2013-12-19 ENCOUNTER — Ambulatory Visit (INDEPENDENT_AMBULATORY_CARE_PROVIDER_SITE_OTHER): Payer: Commercial Managed Care - HMO | Admitting: Internal Medicine

## 2013-12-19 ENCOUNTER — Encounter: Payer: Self-pay | Admitting: Internal Medicine

## 2013-12-19 VITALS — BP 150/70 | HR 78 | Ht 74.0 in | Wt 237.0 lb

## 2013-12-19 DIAGNOSIS — I10 Essential (primary) hypertension: Secondary | ICD-10-CM

## 2013-12-19 DIAGNOSIS — E785 Hyperlipidemia, unspecified: Secondary | ICD-10-CM

## 2013-12-19 DIAGNOSIS — R9431 Abnormal electrocardiogram [ECG] [EKG]: Secondary | ICD-10-CM

## 2013-12-19 DIAGNOSIS — I447 Left bundle-branch block, unspecified: Secondary | ICD-10-CM

## 2013-12-19 NOTE — Progress Notes (Addendum)
HPI Patient is a 79 yo who is referred for abnromal EKG  Followed by Dr Ronnald Ramp Patient has no known history of CAD Says he has been SOB with activity for awhile  No change.  Denies dizzienss.  No CP   Works in [employer].    Remote syncope when had pneumonia Allergies  Allergen Reactions  . Ace Inhibitors     REACTION: cough    Current Outpatient Prescriptions  Medication Sig Dispense Refill  . aspirin 81 MG EC tablet Take 81 mg by mouth daily.        Marland Kitchen atorvastatin (LIPITOR) 80 MG tablet TAKE 1 TABLET BY MOUTH EVERY DAY  90 tablet  3  . AZOR 10-40 MG per tablet TAKE 1 TABLET BY MOUTH EVERY DAY  90 tablet  3  . folic acid (FOLVITE) 431 MCG tablet Take 400 mcg by mouth every evening.        . mometasone-formoterol (DULERA) 100-5 MCG/ACT AERO 2 puffs through spacer tube, then rinse mouth, twice daily  3 Inhaler  0  . Multiple Vitamins-Minerals (MULTIVITAMIN,TX-MINERALS) tablet Take 1 tablet by mouth daily.        [name] Apply to eye daily.      Marland Kitchen Spacer/Aero-Holding Chambers (AEROCHAMBER MV) inhaler Use as instructed  1 each  0  . theophylline (THEODUR) 200 MG 12 hr tablet 1 twice daily with food  60 tablet  prn  . vitamin E 400 UNIT capsule Take 400 Units by mouth daily.         No current facility-administered medications for this visit.    Past Medical History  Diagnosis Date  . Asthma   . COPD (chronic obstructive pulmonary disease)     FEV1 2.68/72%, FEV1/FVC 0.63 (08/22/04  . Colon polyps   . Hyperlipidemia   . HTN (hypertension)   . Prostate cancer 2010  . Arthritis     Past Surgical History  Procedure Laterality Date  . Prostatectomy  2010    robot, laparoscopic for prostate cancer  . Cataract extraction w/ intraocular lens  implant, bilateral Bilateral 2010    Family History  Problem Relation Age of Onset  . Arthritis Other   . Esophagitis Mother     STRICTURE  . Colon cancer Father 23  . Prostate cancer  Father   . Cancer Father     prostate    History   Social History  . Marital Status: Married    Spouse Name: N/A    Number of Children: N/A  . Years of Education: N/A   Occupational History  . Not on file.   Social History Main Topics  . Smoking status: Former Smoker -- 1.50 packs/day for 40 years    Types: Cigarettes    Quit date: 05/05/2002  . Smokeless tobacco: Never Used  . Alcohol Use: No  . Drug Use: No  . Sexual Activity: Not Currently   Other Topics Concern  . Not on file   Social History Narrative   Regular Exercise -  NO          Review of Systems:  All systems reviewed.  They are negative to the above problem except as previously stated.  Vital Signs: There were no vitals taken for this visit.  Physical Exam  HEENT:  Normocephalic, atraumatic. EOMI, PERRLA.  Neck: JVP is normal.  No bruits.  Lungs:Some decreased airflw  No wheezes or rales.   Heart: Regular rate and rhythm. Normal  S1, S2. No S3.   No significant murmurs. PMI not displaced.  Abdomen:  Supple, nontender. Normal bowel sounds. No masses. No hepatomegaly.  Extremities:   Good distal pulses throughout. No lower extremity edema.  Musculoskeletal :moving all extremities.  Neuro:   alert and oriented x3.  CN II-XII grossly intact.  EKG  SR 78 bpm First degree AV block  PR interval 232 msec  LBBB  Assessment and Plan:  1.  LBBB  Will set up for [place] needs to hold theophylline the day before and AM of test.  2.  HTN  Follow  Keep on meds  3.  HL  Keep on statin.   ADDENDUM:  01/04/14  Patinet had echo and myoview scans that were both signif abnormal Evid of scar with LV dysfunction. Discussed with patient and wife  WOuld recommend LHC to define anatomy Patient understands  Agrees to proceed.  Dorris Carnes

## 2013-12-19 NOTE — Patient Instructions (Addendum)
Your physician recommends that you continue on your current medications as directed. Please refer to the Current Medication list given to you today. Your physician has requested that you have an echocardiogram. Echocardiography is a painless test that uses sound waves to create images of your heart. It provides your doctor with information about the size and shape of your heart and how well your heart's chambers and valves are working. This procedure takes approximately one hour. There are no restrictions for this procedure.  Your physician has requested that you have a lexiscan myoview. For further information please visit [email or web address]. Please follow instruction sheet, as given.---PLEASE DO NOT TAKE THEOPHYLLINE THE NIGHT BEFORE OR THE MORNING BEFORE YOUR STRESS TEST.

## 2013-12-23 ENCOUNTER — Encounter: Payer: Self-pay | Admitting: Internal Medicine

## 2013-12-23 ENCOUNTER — Telehealth: Payer: Self-pay | Admitting: *Deleted

## 2013-12-23 NOTE — Telephone Encounter (Signed)
Per Dr. Harrington Challenger pt should not take Theophylline day before and morning of stress test (3 doses total). I spoke with pt's wife and gave her this information.

## 2013-12-27 ENCOUNTER — Ambulatory Visit (HOSPITAL_COMMUNITY): Payer: Medicare HMO | Attending: Cardiology | Admitting: Cardiology

## 2013-12-27 ENCOUNTER — Ambulatory Visit (HOSPITAL_BASED_OUTPATIENT_CLINIC_OR_DEPARTMENT_OTHER): Payer: Medicare HMO | Admitting: Radiology

## 2013-12-27 VITALS — BP 149/77 | HR 65 | Ht 74.0 in | Wt 233.0 lb

## 2013-12-27 DIAGNOSIS — R9431 Abnormal electrocardiogram [ECG] [EKG]: Secondary | ICD-10-CM

## 2013-12-27 DIAGNOSIS — I447 Left bundle-branch block, unspecified: Secondary | ICD-10-CM

## 2013-12-27 DIAGNOSIS — R0602 Shortness of breath: Secondary | ICD-10-CM

## 2013-12-27 DIAGNOSIS — E785 Hyperlipidemia, unspecified: Secondary | ICD-10-CM

## 2013-12-27 DIAGNOSIS — I2789 Other specified pulmonary heart diseases: Secondary | ICD-10-CM | POA: Diagnosis not present

## 2013-12-27 DIAGNOSIS — I10 Essential (primary) hypertension: Secondary | ICD-10-CM

## 2013-12-27 MED ORDER — TECHNETIUM TC 99M SESTAMIBI GENERIC - CARDIOLITE
30.0000 | Freq: Once | INTRAVENOUS | Status: AC | PRN
  Administered 2013-12-27: 30 via INTRAVENOUS

## 2013-12-27 MED ORDER — REGADENOSON 0.4 MG/5ML IV SOLN
0.4000 mg | Freq: Once | INTRAVENOUS | Status: AC
  Administered 2013-12-27: 0.4 mg via INTRAVENOUS

## 2013-12-27 MED ORDER — TECHNETIUM TC 99M SESTAMIBI GENERIC - CARDIOLITE
10.0000 | Freq: Once | INTRAVENOUS | Status: AC | PRN
  Administered 2013-12-27: 10 via INTRAVENOUS

## 2013-12-27 NOTE — Progress Notes (Signed)
[facility] SITE [facility] NUCLE[facility] [address] [place], [place] [address] [phone]    Cardiology Nuclear Med Study  [name] is a 79 y.o. male     MRN : [id number]     DOB: 05-04-1935  Procedure Date: 12/27/2013  Nuclear Med Background Indication for Stress Test:  Evaluation for Ischemia History:  Asthma, COPD and Emphysema Cardiac Risk Factors: History of Smoking, Hypertension, LBBB, Lipids and NIDDM  Symptoms:  DOE and SOB   Nuclear Pre-Procedure Caffeine/Decaff Intake:  None NPO After: 7:00pm   Lungs:  clear O2 Sat: 96% on room air. IV 0.9% NS with Angio Cath:  20g  IV Site: R Antecubital  IV Started by:  Perrin Maltese, EMT-P  Chest Size (in):  48 Cup Size: n/a  Height: 6\' 2"  (1.88 m)  Weight:  233 lb (105.688 kg)  BMI:  Body mass index is 29.9 kg/(m^2). Tech Comments: Rx this am No Theodur in 48 hrs    Nuclear Med Study 1 or 2 day study: 1 day  Stress Test Type:  Carlton Adam  Reading MD: n/a  Order Authorizing Provider:  P.Ross MD  Resting Radionuclide: Technetium 27m Sestamibi  Resting Radionuclide Dose: 11.0 mCi   Stress Radionuclide:  Technetium 26m Sestamibi  Stress Radionuclide Dose: 33.0 mCi           Stress Protocol Rest HR: 65 Stress HR: 86  Rest BP: 149/77 Stress BP: 152/86  Exercise Time (min): n/a METS: n/a           Dose of Adenosine (mg):  n/a Dose of Lexiscan: 0.4 mg  Dose of Atropine (mg): n/a Dose of Dobutamine: n/a mcg/kg/min (at max HR)  Stress Test Technologist: Glade Lloyd, BS-ES  Nuclear Technologist:  Annye Rusk, CNMT     Rest Procedure:  Myocardial perfusion imaging was performed at rest 45 minutes following the intravenous administration of Technetium 79m Sestamibi. Rest ECG: NSR-LBBB  Stress Procedure:  The patient received IV Lexiscan 0.4 mg over 15-seconds.  Technetium 32m Sestamibi injected at 30-seconds.  Quantitative spect images were obtained after a 45 minute delay.  During the infusion of Lexiscan  the patient complained of SOB, cough, head tension and stomach tension.  These symptoms began to resolve in recovery.  Stress ECG: No significant change from baseline ECG  QPS Raw Data Images:  Patient motion noted. Stress Images:  Decreased uptake entire septum and inferior wall Rest Images:  Decreased uptake entire septum and inferior wall Subtraction (SDS):  No reversibility is appreciated. Transient Ischemic Dilatation (Normal <1.22):  1.05 Lung/Heart Ratio (Normal <0.45):  0.31  Quantitative Gated Spect Images QGS EDV:  n/a QGS ESV:  n/a  Impression Exercise Capacity:  Lexiscan with no exercise. BP Response:  Normal blood pressure response. Clinical Symptoms:  There is dyspnea. ECG Impression:  No significant ST segment change suggestive of ischemia. 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  LV Ejection Fraction: Study not gated.  LV Wall Motion:  Study not gated  EF 25-30% by echo    Jenkins Rouge

## 2013-12-27 NOTE — Progress Notes (Signed)
Echo performed. 

## 2013-12-30 ENCOUNTER — Encounter: Payer: Self-pay | Admitting: *Deleted

## 2013-12-30 ENCOUNTER — Telehealth: Payer: Self-pay | Admitting: *Deleted

## 2013-12-30 DIAGNOSIS — Z01812 Encounter for preprocedural laboratory examination: Secondary | ICD-10-CM

## 2013-12-30 NOTE — Telephone Encounter (Signed)
Cath scheduled for Wednesday 9/2 with Dr. Angelena Form for 11:30 am. Coming for labs on Monday 8/31. Letter printed and ready to go over with patient when he comes for labs.

## 2013-12-30 NOTE — Telephone Encounter (Signed)
Message copied by Rodman Key on Fri Dec 30, 2013  9:56 AM ------      Message from: Dorris Carnes V      Created: Fri Dec 30, 2013  9:50 AM       Spoke to patient about results  Would recommm cardiac cath to evaluate.      Needs precath labs  Sched cath for 9/2 with Burt Knack or [place].        Patient agrees to proceed. ------

## 2014-01-02 ENCOUNTER — Other Ambulatory Visit (INDEPENDENT_AMBULATORY_CARE_PROVIDER_SITE_OTHER): Payer: Commercial Managed Care - HMO

## 2014-01-02 DIAGNOSIS — Z01812 Encounter for preprocedural laboratory examination: Secondary | ICD-10-CM

## 2014-01-02 LAB — BASIC METABOLIC PANEL
BUN: 13 mg/dL (ref 6–23)
CALCIUM: 8.8 mg/dL (ref 8.4–10.5)
CHLORIDE: 103 meq/L (ref 96–112)
CO2: 28 mEq/L (ref 19–32)
CREATININE: 1 mg/dL (ref 0.4–1.5)
GFR: 93.25 mL/min (ref >60.00)
Glucose, Bld: 99 mg/dL (ref 70–99)
Potassium: 3.2 mEq/L — ABNORMAL LOW (ref 3.5–5.1)
Sodium: 139 mEq/L (ref 135–145)

## 2014-01-02 LAB — CBC
HCT: 34 % — ABNORMAL LOW (ref 39.0–52.0)
Hemoglobin: 10.8 g/dL — ABNORMAL LOW (ref 13.0–17.0)
MCHC: 31.7 g/dL (ref 30.0–36.0)
MCV: 77 fl — ABNORMAL LOW (ref 78.0–100.0)
Platelets: 256 10*3/uL (ref 150.0–400.0)
RBC: 4.42 Mil/uL (ref 4.22–5.81)
RDW: 19 % — AB (ref 11.5–15.5)
WBC: 4.6 10*3/uL (ref 4.0–10.5)

## 2014-01-02 LAB — PROTIME-INR
INR: 1 ratio (ref 0.8–1.0)
Prothrombin Time: 11 s (ref 9.6–13.1)

## 2014-01-02 NOTE — Telephone Encounter (Signed)
Reviewed letter with patient and his wife.

## 2014-01-03 ENCOUNTER — Encounter (HOSPITAL_COMMUNITY): Payer: Self-pay | Admitting: Pharmacy Technician

## 2014-01-04 ENCOUNTER — Encounter (HOSPITAL_COMMUNITY)
Admission: RE | Disposition: A | Discharge status: Home / Self Care | Payer: Self-pay | Source: Ambulatory Visit | Attending: Cardiovascular Disease

## 2014-01-04 ENCOUNTER — Ambulatory Visit (HOSPITAL_COMMUNITY)
Admission: RE | Admit: 2014-01-04 | Discharge: 2014-01-04 | Disposition: A | Discharge status: Home / Self Care | Payer: Medicare HMO | Source: Ambulatory Visit | Attending: Cardiovascular Disease | Admitting: Cardiovascular Disease

## 2014-01-04 ENCOUNTER — Telehealth: Payer: Self-pay | Admitting: Internal Medicine

## 2014-01-04 DIAGNOSIS — IMO0002 Reserved for concepts with insufficient information to code with codable children: Secondary | ICD-10-CM | POA: Insufficient documentation

## 2014-01-04 DIAGNOSIS — I10 Essential (primary) hypertension: Secondary | ICD-10-CM | POA: Diagnosis not present

## 2014-01-04 DIAGNOSIS — Z87891 Personal history of nicotine dependence: Secondary | ICD-10-CM | POA: Insufficient documentation

## 2014-01-04 DIAGNOSIS — Z8546 Personal history of malignant neoplasm of prostate: Secondary | ICD-10-CM | POA: Diagnosis not present

## 2014-01-04 DIAGNOSIS — J449 Chronic obstructive pulmonary disease, unspecified: Secondary | ICD-10-CM | POA: Insufficient documentation

## 2014-01-04 DIAGNOSIS — I428 Other cardiomyopathies: Secondary | ICD-10-CM | POA: Insufficient documentation

## 2014-01-04 DIAGNOSIS — E785 Hyperlipidemia, unspecified: Secondary | ICD-10-CM | POA: Diagnosis not present

## 2014-01-04 DIAGNOSIS — Z7982 Long term (current) use of aspirin: Secondary | ICD-10-CM | POA: Insufficient documentation

## 2014-01-04 DIAGNOSIS — J4489 Other specified chronic obstructive pulmonary disease: Secondary | ICD-10-CM | POA: Insufficient documentation

## 2014-01-04 HISTORY — PX: LEFT HEART CATHETERIZATION WITH CORONARY ANGIOGRAM: SHX5451

## 2014-01-04 LAB — POTASSIUM: POTASSIUM: 3.7 meq/L (ref 3.7–5.3)

## 2014-01-04 SURGERY — LEFT HEART CATHETERIZATION WITH CORONARY ANGIOGRAM
Anesthesia: LOCAL

## 2014-01-04 MED ORDER — SODIUM CHLORIDE 0.9 % IJ SOLN: 3.0000 mL | Freq: Two times a day (BID) | INTRAMUSCULAR | Status: DC

## 2014-01-04 MED ORDER — MIDAZOLAM HCL 2 MG/2ML IJ SOLN
INTRAMUSCULAR | Status: AC
  Filled 2014-01-04: qty 2

## 2014-01-04 MED ORDER — HEPARIN (PORCINE) IN NACL 2-0.9 UNIT/ML-% IJ SOLN
INTRAMUSCULAR | Status: AC
  Filled 2014-01-04: qty 1000

## 2014-01-04 MED ORDER — VERAPAMIL HCL 2.5 MG/ML IV SOLN
INTRAVENOUS | Status: AC
  Filled 2014-01-04: qty 2

## 2014-01-04 MED ORDER — SODIUM CHLORIDE 0.9 % IJ SOLN: 3.0000 mL | INTRAMUSCULAR | Status: DC | PRN

## 2014-01-04 MED ORDER — NITROGLYCERIN 1 MG/10 ML FOR IR/CATH LAB
INTRA_ARTERIAL | Status: AC
  Filled 2014-01-04: qty 10

## 2014-01-04 MED ORDER — SODIUM CHLORIDE 0.9 % IV SOLN: INTRAVENOUS | Status: DC

## 2014-01-04 MED ORDER — ASPIRIN 81 MG PO CHEW: 81.0000 mg | CHEWABLE_TABLET | ORAL | Status: DC

## 2014-01-04 MED ORDER — FENTANYL CITRATE 0.05 MG/ML IJ SOLN
INTRAMUSCULAR | Status: AC
  Filled 2014-01-04: qty 2

## 2014-01-04 MED ORDER — HEPARIN SODIUM (PORCINE) 1000 UNIT/ML IJ SOLN
INTRAMUSCULAR | Status: AC
  Filled 2014-01-04: qty 1

## 2014-01-04 MED ORDER — SODIUM CHLORIDE 0.9 % IV SOLN: 250.0000 mL | INTRAVENOUS | Status: DC | PRN

## 2014-01-04 MED ORDER — LIDOCAINE HCL (PF) 1 % IJ SOLN
INTRAMUSCULAR | Status: AC
  Filled 2014-01-04: qty 30

## 2014-01-04 NOTE — Discharge Instructions (Signed)
Radial Site Care °Refer to this sheet in the next few weeks. These instructions provide you with information on caring for yourself after your procedure. Your caregiver may also give you more specific instructions. Your treatment has been planned according to current medical practices, but problems sometimes occur. Call your caregiver if you have any problems or questions after your procedure. °HOME CARE INSTRUCTIONS °· You may shower the day after the procedure. Remove the bandage (dressing) and gently wash the site with plain soap and water. Gently pat the site dry. °· Do not apply powder or lotion to the site. °· Do not submerge the affected site in water for 3 to 5 days. °· Inspect the site at least twice daily. °· Do not flex or bend the affected arm for 24 hours. °· No lifting over 5 pounds (2.3 kg) for 5 days after your procedure. °· Do not drive home if you are discharged the same day of the procedure. Have someone else drive you. °· You may drive 24 hours after the procedure unless otherwise instructed by your caregiver. °· Do not operate machinery or power tools for 24 hours. °· A responsible adult should be with you for the first 24 hours after you arrive home. °What to expect: °· Any bruising will usually fade within 1 to 2 weeks. °· Blood that collects in the tissue (hematoma) may be painful to the touch. It should usually decrease in size and tenderness within 1 to 2 weeks. °SEEK IMMEDIATE MEDICAL CARE IF: °· You have unusual pain at the radial site. °· You have redness, warmth, swelling, or pain at the radial site. °· You have drainage (other than a small amount of blood on the dressing). °· You have chills. °· You have a fever or persistent symptoms for more than 72 hours. °· You have a fever and your symptoms suddenly get worse. °· Your arm becomes pale, cool, tingly, or numb. °· You have heavy bleeding from the site. Hold pressure on the site. °Document Released: 05/24/2010 Document Revised:  07/14/2011 Document Reviewed: 05/24/2010 °ExitCare® Patient Information ©2015 [employer]. This information is not intended to replace advice given to you by your health care provider. Make sure you discuss any questions you have with your health care provider. ° °

## 2014-01-04 NOTE — H&P (View-Only) (Signed)
HPI Patient is a 79 yo who is referred for abnromal EKG  Followed by Dr Ronnald Ramp Patient has no known history of CAD Says he has been SOB with activity for awhile  No change.  Denies dizzienss.  No CP   Works in [employer].    Remote syncope when had pneumonia Allergies  Allergen Reactions  . Ace Inhibitors     REACTION: cough    Current Outpatient Prescriptions  Medication Sig Dispense Refill  . aspirin 81 MG EC tablet Take 81 mg by mouth daily.        Marland Kitchen atorvastatin (LIPITOR) 80 MG tablet TAKE 1 TABLET BY MOUTH EVERY DAY  90 tablet  3  . AZOR 10-40 MG per tablet TAKE 1 TABLET BY MOUTH EVERY DAY  90 tablet  3  . folic acid (FOLVITE) 606 MCG tablet Take 400 mcg by mouth every evening.        . mometasone-formoterol (DULERA) 100-5 MCG/ACT AERO 2 puffs through spacer tube, then rinse mouth, twice daily  3 Inhaler  0  . Multiple Vitamins-Minerals (MULTIVITAMIN,TX-MINERALS) tablet Take 1 tablet by mouth daily.        [name] Apply to eye daily.      Marland Kitchen Spacer/Aero-Holding Chambers (AEROCHAMBER MV) inhaler Use as instructed  1 each  0  . theophylline (THEODUR) 200 MG 12 hr tablet 1 twice daily with food  60 tablet  prn  . vitamin E 400 UNIT capsule Take 400 Units by mouth daily.         No current facility-administered medications for this visit.    Past Medical History  Diagnosis Date  . Asthma   . COPD (chronic obstructive pulmonary disease)     FEV1 2.68/72%, FEV1/FVC 0.63 (08/22/04  . Colon polyps   . Hyperlipidemia   . HTN (hypertension)   . Prostate cancer 2010  . Arthritis     Past Surgical History  Procedure Laterality Date  . Prostatectomy  2010    robot, laparoscopic for prostate cancer  . Cataract extraction w/ intraocular lens  implant, bilateral Bilateral 2010    Family History  Problem Relation Age of Onset  . Arthritis Other   . Esophagitis Mother     STRICTURE  . Colon cancer Father 3  . Prostate cancer  Father   . Cancer Father     prostate    History   Social History  . Marital Status: Married    Spouse Name: N/A    Number of Children: N/A  . Years of Education: N/A   Occupational History  . Not on file.   Social History Main Topics  . Smoking status: Former Smoker -- 1.50 packs/day for 40 years    Types: Cigarettes    Quit date: 05/05/2002  . Smokeless tobacco: Never Used  . Alcohol Use: No  . Drug Use: No  . Sexual Activity: Not Currently   Other Topics Concern  . Not on file   Social History Narrative   Regular Exercise -  NO          Review of Systems:  All systems reviewed.  They are negative to the above problem except as previously stated.  Vital Signs: There were no vitals taken for this visit.  Physical Exam  HEENT:  Normocephalic, atraumatic. EOMI, PERRLA.  Neck: JVP is normal.  No bruits.  Lungs:Some decreased airflw  No wheezes or rales.   Heart: Regular rate and rhythm. Normal  S1, S2. No S3.   No significant murmurs. PMI not displaced.  Abdomen:  Supple, nontender. Normal bowel sounds. No masses. No hepatomegaly.  Extremities:   Good distal pulses throughout. No lower extremity edema.  Musculoskeletal :moving all extremities.  Neuro:   alert and oriented x3.  CN II-XII grossly intact.  EKG  SR 78 bpm First degree AV block  PR interval 232 msec  LBBB  Assessment and Plan:  1.  LBBB  Will set up for [place] needs to hold theophylline the day before and AM of test.  2.  HTN  Follow  Keep on meds  3.  HL  Keep on statin.   ADDENDUM:  01/04/14  Patinet had echo and myoview scans that were both signif abnormal Evid of scar with LV dysfunction. Discussed with patient and wife  WOuld recommend LHC to define anatomy Patient understands  Agrees to proceed.  Dorris Carnes

## 2014-01-04 NOTE — Telephone Encounter (Signed)
No call made  Error

## 2014-01-04 NOTE — Interval H&P Note (Signed)
History and Physical Interval Note:  01/04/2014 12:09 PM  [name]  has presented today for cardiac cath with the diagnosis of cardiomyopathy, dyspnea and abnormal myoview  The various methods of treatment have been discussed with the patient and family. After consideration of risks, benefits and other options for treatment, the patient has consented to  Procedure(s): LEFT HEART CATHETERIZATION WITH CORONARY ANGIOGRAM (N/A) as a surgical intervention .  The patient's history has been reviewed, patient examined, no change in status, stable for surgery.  I have reviewed the patient's chart and labs.  Questions were answered to the patient's satisfaction.    Cath Lab Visit (complete for each Cath Lab visit)  Clinical Evaluation Leading to the Procedure:   ACS: No.  Non-ACS:    Anginal Classification: CCS II  Anti-ischemic medical therapy: Minimal Therapy (1 class of medications)  Non-Invasive Test Results: Intermediate-risk stress test findings: cardiac mortality 1-3%/year  Prior CABG: No previous CABG        MCALHANY,[name]

## 2014-01-04 NOTE — CV Procedure (Signed)
      Cardiac Catheterization Operative Report  [name] [id number] 9/2/201512:53 PM Scarlette Calico, MD  Procedure Performed:  1. Left Heart Catheterization 2. Selective Coronary Angiography  Operator: Lauree Chandler, MD  Arterial access site:  Right radial artery.   Indication: 79 yo male with history of HTN, HLD, former tobacco abuse, COPD with recent dyspnea on exertion. He has been evaluated by Dr. Harrington Challenger and found to have a cardiomyopathy with LVEF=25-30%. Stress myoview with possible ischemia.                                     Procedure Details: The risks, benefits, complications, treatment options, and expected outcomes were discussed with the patient. The patient and/or family concurred with the proposed plan, giving informed consent. The patient was brought to the cath lab after IV hydration was begun and oral premedication was given. The patient was further sedated with Versed and Fentanyl. The right wrist was assessed with a reverse Allens test which was positive. The right wrist was prepped and draped in a sterile fashion. 1% lidocaine was used for local anesthesia. Using the modified Seldinger access technique, a 5 French sheath was placed in the right radial artery. 3 mg Verapamil was given through the sheath. 5000 units IV heparin was given. Standard diagnostic catheters were used to perform selective coronary angiography. A pigtail catheter was used to cross the aortic valve. LV pressures were measured. The sheath was removed from the right radial artery and a Terumo hemostasis band was applied at the arteriotomy site on the right wrist.    There were no immediate complications. The patient was taken to the recovery area in stable condition.   Hemodynamic Findings: Central aortic pressure: 120/58 Left ventricular pressure: 119/8/15  Angiographic Findings:  Left main: No obstructive disease.   Left Anterior Descending Artery: Large caliber vessel that  courses to the apex. There is a moderate caliber diagonal branch. No obstructive disease.   Circumflex Artery: Large caliber vessel with early obtuse marginal branch. No obstructive disease.   Right Coronary Artery: Large dominant vessel with no obstructive disease.   Left Ventricular Angiogram: Deferred.   Impression: 1. No angiographic evidence of CAD 2. Non-ischemic cardiomyopathy  Recommendations: Medical management of non-ischemic cardiomyopathy.        Complications:  None. The patient tolerated the procedure well.

## 2014-01-06 ENCOUNTER — Telehealth: Payer: Self-pay | Admitting: Internal Medicine

## 2014-01-06 NOTE — Telephone Encounter (Signed)
New Prob    Pt has some questions regarding next follow up appointment. Please call.

## 2014-01-06 NOTE — Telephone Encounter (Signed)
PT  CALLING  WANTING  TO KNOW  WHAT NEXT STEP  IS  CATH    WAS OKAY   AND  WHEN DO YOU  WANT T O  SEE HIM   BACK IN OFFICE ?

## 2014-01-09 NOTE — Telephone Encounter (Signed)
Patient should be on a b blocker  Would add low dose coreg 1/2 of 3.125 bid  Continue other meds Need to treat with medicines that are beneficial for CHF/LV dysfunction F/U with me in a couple wks. Will neet appt with Beckie Salts in EP since EF down.

## 2014-01-10 MED ORDER — CARVEDILOL 3.125 MG PO TABS: 3.1250 mg | ORAL_TABLET | Freq: Two times a day (BID) | ORAL | Status: DC

## 2014-01-10 NOTE — Telephone Encounter (Signed)
Pt returned the call.

## 2014-01-10 NOTE — Telephone Encounter (Signed)
Left message with wife. Patient will call back when he returns from the store.

## 2014-01-10 NOTE — Telephone Encounter (Signed)
Called patient with MD recommendations. He will start Coreg and see Dr.Ross on 9/25 at 9am. Will send message to [facility] to schedule EP consult.

## 2014-01-11 ENCOUNTER — Other Ambulatory Visit: Payer: Self-pay

## 2014-01-11 MED ORDER — CARVEDILOL 3.125 MG PO TABS: ORAL_TABLET | ORAL | Status: DC

## 2014-01-13 ENCOUNTER — Telehealth: Payer: Self-pay | Admitting: Internal Medicine

## 2014-01-13 NOTE — Telephone Encounter (Signed)
Requested clarification on coreg dose. Reviewed orders/meds. Informed [name] at [facility] pt is to take 1/2 in AM and 1/2 in PM of carvedilol 3.125 tablet.

## 2014-01-13 NOTE — Telephone Encounter (Signed)
°  [name] from [facility] needs clarification on medication, please call and advise. [name] said she's been trying to obtain this information for over a week.

## 2014-01-27 ENCOUNTER — Encounter: Payer: Self-pay | Admitting: Internal Medicine

## 2014-01-27 ENCOUNTER — Ambulatory Visit (INDEPENDENT_AMBULATORY_CARE_PROVIDER_SITE_OTHER): Payer: Commercial Managed Care - HMO | Admitting: Internal Medicine

## 2014-01-27 VITALS — BP 142/78 | HR 73 | Ht 74.0 in | Wt 240.0 lb

## 2014-01-27 DIAGNOSIS — I447 Left bundle-branch block, unspecified: Secondary | ICD-10-CM

## 2014-01-27 MED ORDER — ATORVASTATIN CALCIUM 40 MG PO TABS: 40.0000 mg | ORAL_TABLET | Freq: Every day | ORAL | Status: DC

## 2014-01-27 NOTE — Progress Notes (Signed)
HPI Patient is a 79 yo who is referred for abnromal EKG  Followed by Dr Ronnald Ramp Patient has no known history of CAD Says he has been SOB with activity for awhile  No change.  Denies dizzienss.  No CP   Works in [employer].    Remote syncope when had pneumonia  I saw thie patient in August   LVEF was down on echo at 25 to 30%  Myoview susp for ischemia   He went on for cardiac cath  This showed normal coronary arteries.    SInce cath he notes some SOB with activity  No PND  No edema.  No dizziness  No palp No Known Allergies  Current Outpatient Prescriptions  Medication Sig Dispense Refill  . amLODipine-olmesartan (AZOR) 10-40 MG per tablet Take 1 tablet by mouth daily.      Marland Kitchen aspirin 81 MG EC tablet Take 81 mg by mouth daily.        Marland Kitchen atorvastatin (LIPITOR) 80 MG tablet Take 80 mg by mouth daily.      . carvedilol (COREG) 3.125 MG tablet Take 1/2 tab in the AM and 1/2 tab in the PM for a total of 3.125mg  daily.  30 tablet  3  . folic acid (FOLVITE) 564 MCG tablet Take 400 mcg by mouth every evening.        . mometasone-formoterol (DULERA) 100-5 MCG/ACT AERO Inhale 2 puffs into the lungs 2 (two) times daily.      . Multiple Vitamins-Minerals (MULTIVITAMIN,TX-MINERALS) tablet Take 1 tablet by mouth daily.        [name] Place 1 drop into both eyes daily.       Marland Kitchen Spacer/Aero-Holding Chambers (AEROCHAMBER MV) inhaler Use as instructed  1 each  0  . theophylline (THEODUR) 200 MG 12 hr tablet Take 200 mg by mouth 2 (two) times daily.      . vitamin E 400 UNIT capsule Take 400 Units by mouth daily.         No current facility-administered medications for this visit.    Past Medical History  Diagnosis Date  . Asthma   . COPD (chronic obstructive pulmonary disease)     FEV1 2.68/72%, FEV1/FVC 0.63 (08/22/04  . Colon polyps   . Hyperlipidemia   . HTN (hypertension)   . Prostate cancer 2010  . Arthritis     Past Surgical History  Procedure  Laterality Date  . Prostatectomy  2010    robot, laparoscopic for prostate cancer  . Cataract extraction w/ intraocular lens  implant, bilateral Bilateral 2010    Family History  Problem Relation Age of Onset  . Arthritis Other   . Esophagitis Mother     STRICTURE  . Colon cancer Father 76  . Prostate cancer Father   . Cancer Father     prostate  . Heart attack Neg Hx   . Stroke Mother   . Hypertension Mother   . Hypertension Father     History   Social History  . Marital Status: Married    Spouse Name: N/A    Number of Children: N/A  . Years of Education: N/A   Occupational History  . Not on file.   Social History Main Topics  . Smoking status: Former Smoker -- 1.50 packs/day for 40 years    Types: Cigarettes    Quit date: 05/05/2002  . Smokeless tobacco: Never Used  . Alcohol Use: No  . Drug Use: No  .  Sexual Activity: Not Currently   Other Topics Concern  . Not on file   Social History Narrative   Regular Exercise -  NO          Review of Systems:  All systems reviewed.  They are negative to the above problem except as previously stated.  Vital Signs: BP 142/78  Pulse 73  Ht 6\' 2"  (1.88 m)  Wt 240 lb (108.863 kg)  BMI 30.80 kg/m2  SpO2 97%  Physical Exam  HEENT:  Normocephalic, atraumatic. EOMI, PERRLA.  Neck: JVP is normal.  No bruits.  Lungs:Some decreased airflw  No wheezes or rales.   Heart: Regular rate and rhythm. Normal S1, S2. No S3.   No significant murmurs. PMI not displaced.  Abdomen:  Supple, nontender. Normal bowel sounds. No masses. No hepatomegaly.  Extremities:   Good distal pulses throughout. No lower extremity edema.  Musculoskeletal :moving all extremities.  Neuro:   alert and oriented x3.  CN II-XII grossly intact.  EKG SB 59   First degree AVB  PR 240 msec.  Nonspecific IVCD.    Assessment and Plan: 1.  Nonischemic cardiolmyopathy  Volume status looks OK  Would keep ona same regimen  Check EKG today.  Repeat echo in Jan  to reassess LVEF  If still down, would refer to EP for possible BiV 2.  LBBB   3.  HTN  Follow   4.  HL  Keep on statin.  Can cut dose to 40  Follow levels of lipids.

## 2014-01-27 NOTE — Patient Instructions (Signed)
Your physician has recommended you make the following change in your medication:  1) DECREASE LIPITOR 40 MG DAILY   Your physician has requested that you have an echocardiogram. Echocardiography is a painless test that uses sound waves to create images of your heart. It provides your doctor with information about the size and shape of your heart and how well your heart's chambers and valves are working. This procedure takes approximately one hour. There are no restrictions for this procedure.  Your physician wants you to follow-up AT END OF January 2016.  You will receive a reminder letter in the mail two months in advance. If you don't receive a letter, please call our office to schedule the follow-up appointment. PLEASE SCHEDULE YOUR ECHO AND YOUR APPOINTMENT WITH DR ROSS FOR THE END OF January.

## 2014-01-30 ENCOUNTER — Other Ambulatory Visit: Payer: Self-pay | Admitting: Internal Medicine

## 2014-02-07 ENCOUNTER — Encounter: Payer: Self-pay | Admitting: Internal Medicine

## 2014-02-07 ENCOUNTER — Ambulatory Visit (INDEPENDENT_AMBULATORY_CARE_PROVIDER_SITE_OTHER)
Admission: RE | Admit: 2014-02-07 | Discharge: 2014-02-07 | Disposition: A | Discharge status: Home / Self Care | Payer: Commercial Managed Care - HMO | Source: Ambulatory Visit | Attending: Internal Medicine | Admitting: Internal Medicine

## 2014-02-07 ENCOUNTER — Ambulatory Visit (INDEPENDENT_AMBULATORY_CARE_PROVIDER_SITE_OTHER): Payer: Commercial Managed Care - HMO | Admitting: Internal Medicine

## 2014-02-07 VITALS — BP 112/58 | HR 94 | Ht 73.0 in | Wt 245.2 lb

## 2014-02-07 DIAGNOSIS — Z23 Encounter for immunization: Secondary | ICD-10-CM

## 2014-02-07 DIAGNOSIS — J449 Chronic obstructive pulmonary disease, unspecified: Secondary | ICD-10-CM

## 2014-02-07 DIAGNOSIS — I447 Left bundle-branch block, unspecified: Secondary | ICD-10-CM

## 2014-02-07 NOTE — Patient Instructions (Signed)
Flu vax  Order- CXR- Dx COPD with emphysema  Order- Office spirometry  Please call as needed

## 2014-02-07 NOTE — Progress Notes (Signed)
11/18/12- 35 former smoker  yoM re-establishing for COPD                            CP Dr Darene Lamer. [name] Has been using Anoro, Daliresp. FOLLOWS FOR: throat congestion, wheezing at night, occ prod cough with clear mucus x3days Main complaint is increased throat clearing for 6 weeks. Dr. Ronnald Moran gave Daliresp, helpful but too expensive. Dulera changed to lAnoro, started shortly before throat became hoarse. Denies dysphagia or throat pain. Occasional sneeze. CXR 06/13/11 IMPRESSION:  Stable chest x-ray with slight hyperaeration. No active lung  disease.  Original Report Authenticated By: Joretta Bachelor, M.D.  01/04/13- 29 former smoker  yoM re-establishing for COPD                            PCP Dr Darene Lamer. [name] FOLLOWS FOR: has been using Dulera in the mornings for the most part(trying to get the second dose in at night); denies any wheezing, cough, congestion, or SOB today. Does not want flu vaccine "made him sick". Feels well controlled now with Dulera once daily. Medications reviewed. Daliresp was too expensive.  08/08/13- 80 former smoker  yoM re-establishing for COPD                            PCP Dr Darene Lamer. [name] ACUTE VISIT: continues to have "tickle" cough/congestion in back of throat. When lying down at night has to clear throat alot. Upper airway rattle/tickle cough lying down. Some dyspnea on exertion. Daliresp was too expensive. Using [facility] CXR 01/05/13 IMPRESSION:  Underlying emphysema. No edema or consolidation.  Original Report Authenticated By: Lowella Grip, M.D.  02/07/14- 62 yoM  former smoker followed for COPD, complicated by DM2, CAD/CM, anemia/ FE          PCP Dr Darene Lamer. [name] FOLLOWS FOR: Pt states he is getting short winded all the time(walking up hills causes the most trouble). Had Myocardial imaging , then Echo showing EF 25-30% with akinesis, moderate Pulmonary hypertension, Grade 1 dCHF, LBBB Increased dyspnea on exertion especially hills and stairs. Occasional wheeze but not cough. Using  [facility] once or twice daily. Anemia Hgb 10.8 on BASA. Had colonoscopy Often forgets theophylline at breakfast time when he skips breakfast. Office Spirometry 02/07/14- moderate airway obstruction. FVC 3.45/84%, FEV1 1.86/60%, FEV1/FVC 0.54, FEF 25-75% 0.67/22%  ROS-see HPI Constitutional:   No-   weight loss, night sweats, fevers, chills, fatigue, lassitude. HEENT:   No-  headaches, difficulty swallowing, tooth/dental problems, sore throat,       No-  sneezing, itching, ear ache, nasal congestion, post nasal drip,  CV:  No-   chest pain, orthopnea, PND, swelling in lower extremities, anasarca, dizziness, palpitations Resp: + shortness of breath with exertion or at rest.              No-   productive cough,  + non-productive cough,  No- coughing up of blood.              No-   change in color of mucus.  No- wheezing.   Skin: No-   rash or lesions. GI:  No-   heartburn, indigestion, abdominal pain, nausea, vomiting, GU:  MS:  No-   joint pain or swelling.   Neuro-     nothing unusual Psych:  No- change in mood or affect. No depression or anxiety.  No memory loss.  OBJ- Physical Exam General- Alert, Oriented, Affect-appropriate, Distress- none acute Skin- rash-none, lesions- none, excoriation- none Lymphadenopathy- none Head- atraumatic            Eyes- Gross vision intact, PERRLA, conjunctivae and secretions clear            Ears- Hearing, canals-normal            Nose- Clear, no-Septal dev, mucus, polyps, erosion, perforation             Throat- Mallampati III , mucosa clear , drainage- none, tonsils- atrophic Neck- flexible , trachea midline, no stridor , thyroid nl, carotid no bruit Chest - symmetrical excursion , unlabored           Heart/CV- RRR , no murmur , no gallop  , no rub, nl s1 s2                           - JVD- none , edema- none, stasis changes- none, varices- none           Lung- clear, wheeze- none, cough- none , dullness-none, rub- none           Chest wall-  Abd-   Br/ Gen/ Rectal- Not done, not indicated Extrem- cyanosis- none, clubbing, none, atrophy- none, strength- nl Neuro- grossly intact to observation

## 2014-02-08 ENCOUNTER — Institutional Professional Consult (permissible substitution): Payer: Commercial Managed Care - HMO | Admitting: Internal Medicine

## 2014-02-12 ENCOUNTER — Encounter: Payer: Self-pay | Admitting: Internal Medicine

## 2014-02-12 NOTE — Assessment & Plan Note (Signed)
Being followed by cardiology with updates to problem list expected to reflect cardiomyopathy

## 2014-02-12 NOTE — Assessment & Plan Note (Addendum)
Most of his increase in exertional dyspnea is probably cardiogenic Plan-emphasize use of Dulera twice daily, flu vaccine, CXR, office spirometry

## 2014-04-13 ENCOUNTER — Encounter (HOSPITAL_COMMUNITY): Payer: Self-pay | Admitting: Cardiovascular Disease

## 2014-04-18 ENCOUNTER — Ambulatory Visit (INDEPENDENT_AMBULATORY_CARE_PROVIDER_SITE_OTHER): Payer: Commercial Managed Care - HMO | Admitting: Family

## 2014-04-18 ENCOUNTER — Encounter: Payer: Self-pay | Admitting: Family

## 2014-04-18 VITALS — BP 160/82 | HR 63 | Temp 98.2°F | Resp 18 | Ht 74.0 in | Wt 236.8 lb

## 2014-04-18 DIAGNOSIS — J069 Acute upper respiratory infection, unspecified: Secondary | ICD-10-CM | POA: Insufficient documentation

## 2014-04-18 NOTE — Progress Notes (Signed)
Pre visit review using our clinic review tool, if applicable. No additional management support is needed unless otherwise documented below in the visit note. 

## 2014-04-18 NOTE — Patient Instructions (Signed)
Thank you for choosing [facility].  Summary/Instructions:  Your prescription(s) have been submitted to your pharmacy. Please take as directed and contact our office if you believe you are having problem(s) with the medication(s).  If your symptoms worsen or fail to improve, please contact our office for further instruction, or in case of emergency go directly to the emergency room at the closest medical facility.   For your cough - Delsym or Robutussin-DM,  Make sure to drink plenty fluids over the course of the day.   Upper Respiratory Infection, Adult An upper respiratory infection (URI) is also sometimes known as the common cold. The upper respiratory tract includes the nose, sinuses, throat, trachea, and bronchi. Bronchi are the airways leading to the lungs. Most people improve within 1 week, but symptoms can last up to 2 weeks. A residual cough may last even longer.  CAUSES Many different viruses can infect the tissues lining the upper respiratory tract. The tissues become irritated and inflamed and often become very moist. Mucus production is also common. A cold is contagious. You can easily spread the virus to others by oral contact. This includes kissing, sharing a glass, coughing, or sneezing. Touching your mouth or nose and then touching a surface, which is then touched by another person, can also spread the virus. SYMPTOMS  Symptoms typically develop 1 to 3 days after you come in contact with a cold virus. Symptoms vary from person to person. They may include:  Runny nose.  Sneezing.  Nasal congestion.  Sinus irritation.  Sore throat.  Loss of voice (laryngitis).  Cough.  Fatigue.  Muscle aches.  Loss of appetite.  Headache.  Low-grade fever. DIAGNOSIS  You might diagnose your own cold based on familiar symptoms, since most people get a cold 2 to 3 times a year. Your caregiver can confirm this based on your exam. Most importantly, your caregiver can check  that your symptoms are not due to another disease such as strep throat, sinusitis, pneumonia, asthma, or epiglottitis. Blood tests, throat tests, and X-rays are not necessary to diagnose a common cold, but they may sometimes be helpful in excluding other more serious diseases. Your caregiver will decide if any further tests are required. RISKS AND COMPLICATIONS  You may be at risk for a more severe case of the common cold if you smoke cigarettes, have chronic heart disease (such as heart failure) or lung disease (such as asthma), or if you have a weakened immune system. The very young and very old are also at risk for more serious infections. Bacterial sinusitis, middle ear infections, and bacterial pneumonia can complicate the common cold. The common cold can worsen asthma and chronic obstructive pulmonary disease (COPD). Sometimes, these complications can require emergency medical care and may be life-threatening. PREVENTION  The best way to protect against getting a cold is to practice good hygiene. Avoid oral or hand contact with people with cold symptoms. Wash your hands often if contact occurs. There is no clear evidence that vitamin C, vitamin E, echinacea, or exercise reduces the chance of developing a cold. However, it is always recommended to get plenty of rest and practice good nutrition. TREATMENT  Treatment is directed at relieving symptoms. There is no cure. Antibiotics are not effective, because the infection is caused by a virus, not by bacteria. Treatment may include:  Increased fluid intake. Sports drinks offer valuable electrolytes, sugars, and fluids.  Breathing heated mist or steam (vaporizer or shower).  Eating chicken soup or other  clear broths, and maintaining good nutrition.  Getting plenty of rest.  Using gargles or lozenges for comfort.  Controlling fevers with ibuprofen or acetaminophen as directed by your caregiver.  Increasing usage of your inhaler if you have  asthma. Zinc gel and zinc lozenges, taken in the first 24 hours of the common cold, can shorten the duration and lessen the severity of symptoms. Pain medicines may help with fever, muscle aches, and throat pain. A variety of non-prescription medicines are available to treat congestion and runny nose. Your caregiver can make recommendations and may suggest nasal or lung inhalers for other symptoms.  HOME CARE INSTRUCTIONS   Only take over-the-counter or prescription medicines for pain, discomfort, or fever as directed by your caregiver.  Use a warm mist humidifier or inhale steam from a shower to increase air moisture. This may keep secretions moist and make it easier to breathe.  Drink enough water and fluids to keep your urine clear or pale yellow.  Rest as needed.  Return to work when your temperature has returned to normal or as your caregiver advises. You may need to stay home longer to avoid infecting others. You can also use a face mask and careful hand washing to prevent spread of the virus. SEEK MEDICAL CARE IF:   After the first few days, you feel you are getting worse rather than better.  You need your caregiver's advice about medicines to control symptoms.  You develop chills, worsening shortness of breath, or brown or red sputum. These may be signs of pneumonia.  You develop yellow or brown nasal discharge or pain in the face, especially when you bend forward. These may be signs of sinusitis.  You develop a fever, swollen neck glands, pain with swallowing, or white areas in the back of your throat. These may be signs of strep throat. SEEK IMMEDIATE MEDICAL CARE IF:   You have a fever.  You develop severe or persistent headache, ear pain, sinus pain, or chest pain.  You develop wheezing, a prolonged cough, cough up blood, or have a change in your usual mucus (if you have chronic lung disease).  You develop sore muscles or a stiff neck. Document Released: 10/15/2000  Document Revised: 07/14/2011 Document Reviewed: 07/27/2013 [facility] Patient Information 2015 [place], [place]. This information is not intended to replace advice given to you by your health care provider. Make sure you discuss any questions you have with your health care provider.

## 2014-04-18 NOTE — Progress Notes (Signed)
   Subjective:    Patient ID: [name], male    DOB: 14-Feb-1935, 79 y.o.   MRN: [id number]  Chief Complaint  Patient presents with  . Cough    x1 week, not feeling bad and gets worse at night, does not feel anything in his chest    HPI:  [name] is a 79 y.o. male who presents today for an acute visit.   Acute symptoms started approximately 1 week ago with a cough and indicates the cough is "coming from my throat."  Does have some congestion. Varies between productive and non-productive cough, which is mostly in the morning.  Denies any fevers, chills or headaches. Has tried some cough medicine and cough drops which seem to provide some relief. Cough is worse at night when he first goes to bed, but then clears up slightly as the night progresses. Sleeps with head propped up on 2 pillows. The course of the disease has improved over the last couple days.  No Known Allergies  Current Outpatient Prescriptions on File Prior to Visit  Medication Sig Dispense Refill  . amLODipine-olmesartan (AZOR) 10-40 MG per tablet Take 1 tablet by mouth daily.    Marland Kitchen aspirin 81 MG EC tablet Take 81 mg by mouth daily.      Marland Kitchen atorvastatin (LIPITOR) 40 MG tablet Take 1 tablet (40 mg total) by mouth daily. 90 tablet 3  . carvedilol (COREG) 3.125 MG tablet Take 1/2 tab in the AM and 1/2 tab in the PM for a total of 3.125mg  daily. 30 tablet 3  . DULERA 100-5 MCG/ACT AERO INHALE 2 PUFFS THROUGH SPACER TUBE THEN RINSE MOUTH TWICE DAILY 39 g 0  . folic acid (FOLVITE) 412 MCG tablet Take 400 mcg by mouth every evening.      . Multiple Vitamins-Minerals (MULTIVITAMIN,TX-MINERALS) tablet Take 1 tablet by mouth daily.      [name] Place 1 drop into both eyes daily.     Marland Kitchen Spacer/Aero-Holding Chambers (AEROCHAMBER MV) inhaler Use as instructed 1 each 0  . theophylline (THEODUR) 200 MG 12 hr tablet Take 200 mg by mouth 2 (two) times daily.    . vitamin E 400 UNIT  capsule Take 400 Units by mouth daily.       No current facility-administered medications on file prior to visit.    Review of Systems    See HPI  Objective:    BP 160/82 mmHg  Pulse 63  Temp(Src) 98.2 F (36.8 C) (Oral)  Resp 18  Ht 6\' 2"  (1.88 m)  Wt 236 lb 12.8 oz (107.412 kg)  BMI 30.39 kg/m2  SpO2 95% Nursing note and vital signs reviewed.  Physical Exam  Constitutional: He is oriented to person, place, and time. He appears well-developed and well-nourished. No distress.  HENT:  Right Ear: Hearing, tympanic membrane, external ear and ear canal normal.  Left Ear: Hearing, tympanic membrane, external ear and ear canal normal.  Nose: Nose normal.  Mouth/Throat: Uvula is midline, oropharynx is clear and moist and mucous membranes are normal.  Cardiovascular: Normal rate, regular rhythm, normal heart sounds and intact distal pulses.   Pulmonary/Chest: Effort normal and breath sounds normal.  Neurological: He is alert and oriented to person, place, and time.  Skin: Skin is warm and dry.  Psychiatric: He has a normal mood and affect. His behavior is normal. Judgment and thought content normal.       Assessment & Plan:

## 2014-04-18 NOTE — Assessment & Plan Note (Signed)
Symptoms and exam consistent with resolving viral upper respiratory infection. Continue supportive care and over-the-counter medications as needed. Follow up if symptoms worsen or fail to improve

## 2014-05-18 ENCOUNTER — Encounter: Payer: Self-pay | Admitting: Internal Medicine

## 2014-05-19 ENCOUNTER — Other Ambulatory Visit: Payer: Self-pay

## 2014-05-19 MED ORDER — AMLODIPINE-OLMESARTAN 10-40 MG PO TABS: 1.0000 | ORAL_TABLET | Freq: Every day | ORAL | Status: DC

## 2014-05-24 ENCOUNTER — Other Ambulatory Visit (HOSPITAL_COMMUNITY): Payer: Commercial Managed Care - HMO

## 2014-05-28 NOTE — Progress Notes (Signed)
Error   This encounter was created in error - please disregard. 

## 2014-05-29 ENCOUNTER — Encounter: Payer: Commercial Managed Care - HMO | Admitting: Internal Medicine

## 2014-05-29 ENCOUNTER — Other Ambulatory Visit (HOSPITAL_COMMUNITY): Payer: Commercial Managed Care - HMO

## 2014-06-07 NOTE — Progress Notes (Signed)
HPI Patient is a 80 yo with history of NICM (cath with normal coronary arteries  LVEF at echo 25 to 30%)  Alos history of LBBB I saw him back in the fall   LVEF was down on echo at 25 to 30%  Myoview susp for ischemia   He went on for cardiac cath  This showed normal coronary arteries.    SInce cath he notes some SOB with activity  No PND  No edema.  No dizziness  No palp No Known Allergies  Current Outpatient Prescriptions  Medication Sig Dispense Refill  . amLODipine-olmesartan (AZOR) 10-40 MG per tablet Take 1 tablet by mouth daily. 90 tablet 3  . aspirin 81 MG EC tablet Take 81 mg by mouth daily.      Marland Kitchen atorvastatin (LIPITOR) 40 MG tablet Take 1 tablet (40 mg total) by mouth daily. 90 tablet 3  . carvedilol (COREG) 3.125 MG tablet Take 1/2 tab in the AM and 1/2 tab in the PM for a total of 3.125mg  daily. 30 tablet 3  . DULERA 100-5 MCG/ACT AERO INHALE 2 PUFFS THROUGH SPACER TUBE THEN RINSE MOUTH TWICE DAILY 39 g 0  . folic acid (FOLVITE) 097 MCG tablet Take 400 mcg by mouth every evening.      . Multiple Vitamins-Minerals (MULTIVITAMIN,TX-MINERALS) tablet Take 1 tablet by mouth daily.      [name] Place 1 drop into both eyes daily.     Marland Kitchen Spacer/Aero-Holding Chambers (AEROCHAMBER MV) inhaler Use as instructed 1 each 0  . theophylline (THEODUR) 200 MG 12 hr tablet Take 200 mg by mouth 2 (two) times daily.    . vitamin E 400 UNIT capsule Take 400 Units by mouth daily.       No current facility-administered medications for this visit.    Past Medical History  Diagnosis Date  . Asthma   . COPD (chronic obstructive pulmonary disease)     FEV1 2.68/72%, FEV1/FVC 0.63 (08/22/04  . Colon polyps   . Hyperlipidemia   . HTN (hypertension)   . Prostate cancer 2010  . Arthritis     Past Surgical History  Procedure Laterality Date  . Prostatectomy  2010    robot, laparoscopic for prostate cancer  . Cataract extraction w/ intraocular lens  implant,  bilateral Bilateral 2010  . Left heart catheterization with coronary angiogram N/A 01/04/2014    Procedure: LEFT HEART CATHETERIZATION WITH CORONARY ANGIOGRAM;  Surgeon: Burnell Blanks, MD;  Location: [facility] CATH LAB;  Service: Cardiovascular;  Laterality: N/A;    Family History  Problem Relation Age of Onset  . Arthritis Other   . Esophagitis Mother     STRICTURE  . Colon cancer Father 61  . Prostate cancer Father   . Cancer Father     prostate  . Heart attack Neg Hx   . Stroke Mother   . Hypertension Mother   . Hypertension Father     History   Social History  . Marital Status: Married    Spouse Name: N/A    Number of Children: N/A  . Years of Education: N/A   Occupational History  . Not on file.   Social History Main Topics  . Smoking status: Former Smoker -- 1.50 packs/day for 40 years    Types: Cigarettes    Quit date: 05/05/2002  . Smokeless tobacco: Never Used  . Alcohol Use: No  . Drug Use: No  . Sexual Activity: Not Currently   Other Topics  Concern  . Not on file   Social History Narrative   Regular Exercise -  NO          Review of Systems:  All systems reviewed.  They are negative to the above problem except as previously stated.  Vital Signs: BP 134/72 mmHg  Pulse 77  Ht 6\' 2"  (1.88 m)  Wt 238 lb 6.4 oz (108.138 kg)  BMI 30.60 kg/m2  Physical Exam  HEENT:  Normocephalic, atraumatic. EOMI, PERRLA.  Neck: JVP is normal.  No bruits.  Lungs:Some decreased airflw  No wheezes or rales.   Heart: Regular rate and rhythm. Normal S1, S2. No S3.   No significant murmurs. PMI not displaced.  Abdomen:  Supple, nontender. Normal bowel sounds. No masses. No hepatomegaly.  Extremities:   Good distal pulses throughout. No lower extremity edema.  Musculoskeletal :moving all extremities.  Neuro:   alert and oriented x3.  CN II-XII grossly intact.  EKG SB 59   First degree AVB  PR 240 msec.  Nonspecific IVCD.    Assessment and Plan: 1.  Nonischemic  cardiolmyopathy  Volume status looks OK  Would keep ona same regimen.  Echo shows LVEF still down  I think in 20 to 25% range  In comparison to images from previous study there is no signif change I will refer the patient to EP for evaluation of BiV /ICD  Patient does give out when he tries to do things    3.  HTN  Follow I will not push med dosing for now.    4.  HL  Keep on statin.  Can cut dose to 40  Follow levels of lipids.

## 2014-06-08 ENCOUNTER — Ambulatory Visit (HOSPITAL_COMMUNITY): Payer: Commercial Managed Care - HMO | Attending: Internal Medicine | Admitting: Radiology

## 2014-06-08 ENCOUNTER — Ambulatory Visit (INDEPENDENT_AMBULATORY_CARE_PROVIDER_SITE_OTHER): Payer: Commercial Managed Care - HMO | Admitting: Internal Medicine

## 2014-06-08 ENCOUNTER — Encounter: Payer: Self-pay | Admitting: Internal Medicine

## 2014-06-08 VITALS — BP 134/72 | HR 77 | Ht 74.0 in | Wt 238.4 lb

## 2014-06-08 DIAGNOSIS — R0602 Shortness of breath: Secondary | ICD-10-CM | POA: Diagnosis not present

## 2014-06-08 DIAGNOSIS — I447 Left bundle-branch block, unspecified: Secondary | ICD-10-CM | POA: Diagnosis not present

## 2014-06-08 DIAGNOSIS — I429 Cardiomyopathy, unspecified: Secondary | ICD-10-CM | POA: Insufficient documentation

## 2014-06-08 DIAGNOSIS — I10 Essential (primary) hypertension: Secondary | ICD-10-CM

## 2014-06-08 DIAGNOSIS — I428 Other cardiomyopathies: Secondary | ICD-10-CM

## 2014-06-08 LAB — BRAIN NATRIURETIC PEPTIDE: Pro B Natriuretic peptide (BNP): 293 pg/mL — ABNORMAL HIGH (ref 0.0–100.0)

## 2014-06-08 LAB — BASIC METABOLIC PANEL
BUN: 13 mg/dL (ref 6–23)
CHLORIDE: 106 meq/L (ref 96–112)
CO2: 28 meq/L (ref 19–32)
CREATININE: 1.15 mg/dL (ref 0.40–1.50)
Calcium: 9.1 mg/dL (ref 8.4–10.5)
GFR: 79.27 mL/min (ref >60.00)
GLUCOSE: 95 mg/dL (ref 70–99)
Potassium: 3.8 mEq/L (ref 3.5–5.1)
SODIUM: 138 meq/L (ref 135–145)

## 2014-06-08 NOTE — Patient Instructions (Signed)
Your physician recommends that you continue on your current medications as directed. Please refer to the Current Medication list given to you today. Your physician recommends that you return for lab work in: TODAY (BMET, BNP) You have been referred to [place]. February 16 AT 11:30 AM

## 2014-06-08 NOTE — Progress Notes (Signed)
Echocardiogram performed.  

## 2014-06-20 ENCOUNTER — Encounter: Payer: Self-pay | Admitting: Internal Medicine

## 2014-06-20 ENCOUNTER — Encounter: Payer: Self-pay | Admitting: *Deleted

## 2014-06-20 ENCOUNTER — Ambulatory Visit (INDEPENDENT_AMBULATORY_CARE_PROVIDER_SITE_OTHER): Payer: Commercial Managed Care - HMO | Admitting: Internal Medicine

## 2014-06-20 VITALS — BP 146/58 | HR 86 | Ht 74.0 in | Wt 241.0 lb

## 2014-06-20 DIAGNOSIS — I10 Essential (primary) hypertension: Secondary | ICD-10-CM | POA: Diagnosis not present

## 2014-06-20 DIAGNOSIS — I5022 Chronic systolic (congestive) heart failure: Secondary | ICD-10-CM

## 2014-06-20 DIAGNOSIS — J449 Chronic obstructive pulmonary disease, unspecified: Secondary | ICD-10-CM

## 2014-06-20 DIAGNOSIS — I429 Cardiomyopathy, unspecified: Secondary | ICD-10-CM | POA: Diagnosis not present

## 2014-06-20 DIAGNOSIS — I428 Other cardiomyopathies: Secondary | ICD-10-CM

## 2014-06-20 NOTE — Assessment & Plan Note (Signed)
His blood pressure is elevated slightly. Once his device is placed, we will attempt up titration of his beta blocker if tolerated.

## 2014-06-20 NOTE — Progress Notes (Signed)
HPI [name] is referred today by Dr. Harrington Challenger for evaluation of chronic systolic heart failure in the setting of LBBB. The patient is a 80 yo man with chronic systolic heart failure who has had symptoms for over a year which have gradually worsened despite medical therapy. His EF is 25% and he has moderate pulmonary HTN. The patient has LBBB on his ECG. He has not had syncope. No peripheral edema. He has class 3 symptoms getting sob when walking on flat ground more than 50 ft. he denies cough or hemoptysis. He has been on maximal medical therapy with a beta blocker and an ARB. No Known Allergies   Current Outpatient Prescriptions  Medication Sig Dispense Refill  . amLODipine-olmesartan (AZOR) 10-40 MG per tablet Take 1 tablet by mouth daily. 90 tablet 3  . aspirin 81 MG EC tablet Take 81 mg by mouth daily.      Marland Kitchen atorvastatin (LIPITOR) 40 MG tablet Take 1 tablet (40 mg total) by mouth daily. 90 tablet 3  . carvedilol (COREG) 3.125 MG tablet Take 1/2 tab in the AM and 1/2 tab in the PM for a total of 3.125mg  daily. 30 tablet 3  . DULERA 100-5 MCG/ACT AERO INHALE 2 PUFFS THROUGH SPACER TUBE THEN RINSE MOUTH TWICE DAILY 39 g 0  . folic acid (FOLVITE) 631 MCG tablet Take 400 mcg by mouth every evening.      . Multiple Vitamins-Minerals (MULTIVITAMIN,TX-MINERALS) tablet Take 1 tablet by mouth daily.      [name] Glycol-Propyl Glycol (SYSTANE OP) Place 1 drop into both eyes daily.     Marland Kitchen Spacer/Aero-Holding Chambers (AEROCHAMBER MV) inhaler Use as instructed 1 each 0  . theophylline (THEODUR) 200 MG 12 hr tablet Take 200 mg by mouth 2 (two) times daily.    . vitamin E 400 UNIT capsule Take 400 Units by mouth daily.       No current facility-administered medications for this visit.     Past Medical History  Diagnosis Date  . Asthma   . COPD (chronic obstructive pulmonary disease)     FEV1 2.68/72%, FEV1/FVC 0.63 (08/22/04  . Colon polyps   . Hyperlipidemia   . HTN (hypertension)     . Prostate cancer 2010  . Arthritis     ROS:   All systems reviewed and negative except as noted in the HPI.   Past Surgical History  Procedure Laterality Date  . Prostatectomy  2010    robot, laparoscopic for prostate cancer  . Cataract extraction w/ intraocular lens  implant, bilateral Bilateral 2010  . Left heart catheterization with coronary angiogram N/A 01/04/2014    Procedure: LEFT HEART CATHETERIZATION WITH CORONARY ANGIOGRAM;  Surgeon: Burnell Blanks, MD;  Location: [facility] CATH LAB;  Service: Cardiovascular;  Laterality: N/A;     Family History  Problem Relation Age of Onset  . Arthritis Other   . Esophagitis Mother     STRICTURE  . Colon cancer Father 7  . Prostate cancer Father   . Cancer Father     prostate  . Heart attack Neg Hx   . Stroke Mother   . Hypertension Mother   . Hypertension Father      History   Social History  . Marital Status: Married    Spouse Name: N/A  . Number of Children: N/A  . Years of Education: N/A   Occupational History  . Not on file.   Social History Main Topics  . Smoking status:  Former Smoker -- 1.50 packs/day for 40 years    Types: Cigarettes    Quit date: 05/05/2002  . Smokeless tobacco: Never Used  . Alcohol Use: No  . Drug Use: No  . Sexual Activity: Not Currently   Other Topics Concern  . Not on file   Social History Narrative   Regular Exercise -  NO           BP 146/58 mmHg  Pulse 86  Ht 6\' 2"  (1.88 m)  Wt 241 lb (109.317 kg)  BMI 30.93 kg/m2  Physical Exam:  Well appearing 79 yo man, NAD HEENT: Unremarkable Neck:  7 cm JVD, no thyromegally Back:  No CVA tenderness Lungs:  Clear with no wheezes, rales or rhonchi HEART:  Regular rate rhythm, no murmurs, no rubs, no clicks, distant heart sounds Abd:  soft, positive bowel sounds, no organomegally, no rebound, no guarding Ext:  2 plus pulses, trace peripheral edema, no cyanosis, no clubbing Skin:  No rashes no nodules Neuro:  CN II  through XII intact, motor grossly intact  EKG - NSR with LBBB  Assess/Plan:

## 2014-06-20 NOTE — Patient Instructions (Addendum)
Your physician has recommended that you have a defibrillator inserted. An implantable cardioverter defibrillator (ICD) is a small device that is placed in your chest or, in rare cases, your abdomen. This device uses electrical pulses or shocks to help control life-threatening, irregular heartbeats that could lead the heart to suddenly stop beating (sudden cardiac arrest). Leads are attached to the ICD that goes into your heart. This is done in the hospital and usually requires an overnight stay. Please see the instruction sheet given to you today for more information.  Your physician recommends that you return for lab work on March 7th (bmet and cbc)  Your physician recommends that you schedule a follow-up appointment in: 7-10 days from 07/17/14 in device clinic for wound check   See instruction sheet for procedure  Bi-V Cardioverter Defibrillator Implantation An implantable cardioverter defibrillator (ICD) is a small, lightweight, battery-powered device that is placed (implanted) under the skin in the chest or abdomen. Your caregiver may prescribe an ICD if:  You have had an irregular heart rhythm (arrhythmia) that originated in the lower chambers of the heart (ventricles).  Your heart has been damaged by a disease (such as coronary artery disease) or heart condition (such as a heart attack). An ICD consists of a battery that lasts several years, a small computer called a pulse generator, and wires called leads that go into the heart. It is used to detect and correct two dangerous arrhythmias: a rapid heart rhythm (tachycardia) and an arrhythmia in which the ventricles contract in an uncoordinated way (fibrillation). When an ICD detects tachycardia, it sends an electrical signal to the heart that restores the heartbeat to normal (cardioversion). This signal is usually painless. If cardioversion does not work or if the ICD detects fibrillation, it delivers a small electrical shock to the heart  (defibrillation) to restart the heart. The shock may feel like a strong jolt in the chest.ICDs may be programmed to correct other problems. Sometimes, ICDs are programmed to act as another type of implantable device called a pacemaker. Pacemakers are used to treat a slow heartbeat (bradycardia). LET YOUR CAREGIVER KNOW ABOUT:  Any allergies you have.  All medicines you are taking, including vitamins, herbs, eyedrops, and over-the-counter medicines and creams.  Previous problems you or members of your family have had with the use of anesthetics.  Any blood disorders you have had.  Other health problems you have. RISKS AND COMPLICATIONS Generally, the procedure to implant an ICD is safe. However, as with any surgical procedure, complications can occur. Possible complications associated with implanting an ICD include:  Swelling, bleeding, or bruising at the site where the ICD was implanted.  Infection at the site where the ICD was implanted.  A reaction to medicine used during the procedure.  Nerve, heart, or blood vessel damage.  Blood clots. BEFORE THE PROCEDURE  You may need to have blood tests, heart tests, or a chest X-ray done before the day of the procedure.  Ask your caregiver about changing or stopping your regular medicines.  Make plans to have someone drive you home. You may need to stay in the hospital overnight after the procedure.  Stop smoking at least 24 hours before the procedure.  Take a bath or shower the night before the procedure. You may need to scrub your chest or abdomen with a special type of soap.  Do not eat or drink before your procedure for as long as directed by your caregiver. Ask if it is okay to take any  needed medicine with a small sip of water. PROCEDURE  The procedure to implant an ICD in your chest or abdomen is usually done at a hospital in a room that has a large X-ray machine called a fluoroscope. The machine will be above you during the  procedure. It will help your caregiver see your heart during the procedure. Implanting an ICD usually takes 1-3 hours. Before the procedure:   Small monitors will be put on your body. They will be used to check your heart, blood pressure, and oxygen level.  A needle will be put into a vein in your hand or arm. This is called an intravenous (IV) access tube. Fluids and medicine will flow directly into your body through the IV tube.  Your chest or abdomen will be cleaned with a germ-killing (antiseptic) solution. The area may be shaved.  You may be given medicine to help you relax (sedative).  You will be given a medicine called a local anesthetic. This medicine will make the surgical site numb while the ICD is implanted. You will be sleepy but awake during the procedure. After you are numb the procedure will begin. The caregiver will:  Make a small cut (incision). This will make a pocket deep under your skin that will hold the pulse generator.  Guide the leads through a large blood vessel into your heart and attach them to the heart muscles. Depending on the ICD, the leads may go into one ventricle or they may go to both ventricles and into an upper chamber of the heart (atrium).  Test the ICD.  Close the incision with stitches, glue, or staples. AFTER THE PROCEDURE  You may feel pain. Some pain is normal. It may last a few days.  You may stay in a recovery area until the local anesthetic has worn off. Your blood pressure and pulse will be checked often. You will be taken to a room where your heart will be monitored.  A chest X-ray will be taken. This is done to check that the cardioverter defibrillator is in the right place.  You may stay in the hospital overnight.  A slight bump may be seen over the skin where the ICD was placed. Sometimes, it is possible to feel the ICD under the skin. This is normal.  In the months and years afterward, your caregiver will check the device, the  leads, and the battery every few months. Eventually, when the battery is low, the ICD will be replaced. Document Released: 01/11/2002 Document Revised: 02/09/2013 Document Reviewed: 05/10/2012 [facility] Patient Information 2015 [place], [place]. This information is not intended to replace advice given to you by your health care provider. Make sure you discuss any questions you have with your health care provider.

## 2014-06-20 NOTE — Assessment & Plan Note (Signed)
The patient has moderate airway obstruction by spirometry. He is currently not actively wheezing. He'll continue his inhalers as needed.

## 2014-06-20 NOTE — Assessment & Plan Note (Signed)
I have discussed the treatment options with the patient. The risks/benefits/goals/expectations of BiV ICD insertion have been discussed with the patient and he wishes to proceed.

## 2014-07-10 ENCOUNTER — Other Ambulatory Visit (INDEPENDENT_AMBULATORY_CARE_PROVIDER_SITE_OTHER): Payer: Commercial Managed Care - HMO | Admitting: *Deleted

## 2014-07-10 DIAGNOSIS — I429 Cardiomyopathy, unspecified: Secondary | ICD-10-CM | POA: Diagnosis not present

## 2014-07-10 DIAGNOSIS — I428 Other cardiomyopathies: Secondary | ICD-10-CM

## 2014-07-10 LAB — CBC WITH DIFFERENTIAL/PLATELET
Basophils Absolute: 0 10*3/uL (ref 0.0–0.1)
Basophils Relative: 0.4 % (ref 0.0–3.0)
EOS ABS: 0.1 10*3/uL (ref 0.0–0.7)
EOS PCT: 1.5 % (ref 0.0–5.0)
HEMATOCRIT: 29.2 % — AB (ref 39.0–52.0)
HEMOGLOBIN: 9.3 g/dL — AB (ref 13.0–17.0)
LYMPHS ABS: 1 10*3/uL (ref 0.7–4.0)
Lymphocytes Relative: 20.2 % (ref 12.0–46.0)
MCHC: 31.9 g/dL (ref 30.0–36.0)
MONO ABS: 0.4 10*3/uL (ref 0.1–1.0)
Monocytes Relative: 8.6 % (ref 3.0–12.0)
NEUTROS ABS: 3.5 10*3/uL (ref 1.4–7.7)
NEUTROS PCT: 69.3 % (ref 43.0–77.0)
Platelets: 345 10*3/uL (ref 150.0–400.0)
RBC: 4.27 Mil/uL (ref 4.22–5.81)
RDW: 19.2 % — ABNORMAL HIGH (ref 11.5–15.5)
WBC: 5.1 10*3/uL (ref 4.0–10.5)

## 2014-07-10 LAB — BASIC METABOLIC PANEL
BUN: 9 mg/dL (ref 6–23)
CALCIUM: 8.8 mg/dL (ref 8.4–10.5)
CHLORIDE: 106 meq/L (ref 96–112)
CO2: 28 mEq/L (ref 19–32)
Creatinine, Ser: 0.99 mg/dL (ref 0.40–1.50)
GFR: 94.21 mL/min (ref >60.00)
Glucose, Bld: 102 mg/dL — ABNORMAL HIGH (ref 70–99)
Potassium: 3.5 mEq/L (ref 3.5–5.1)
Sodium: 139 mEq/L (ref 135–145)

## 2014-07-14 DIAGNOSIS — J45909 Unspecified asthma, uncomplicated: Secondary | ICD-10-CM | POA: Diagnosis not present

## 2014-07-14 DIAGNOSIS — M1990 Unspecified osteoarthritis, unspecified site: Secondary | ICD-10-CM | POA: Diagnosis not present

## 2014-07-14 DIAGNOSIS — I272 Other secondary pulmonary hypertension: Secondary | ICD-10-CM | POA: Diagnosis not present

## 2014-07-14 DIAGNOSIS — Z79899 Other long term (current) drug therapy: Secondary | ICD-10-CM | POA: Diagnosis not present

## 2014-07-14 DIAGNOSIS — E785 Hyperlipidemia, unspecified: Secondary | ICD-10-CM | POA: Diagnosis not present

## 2014-07-14 DIAGNOSIS — I429 Cardiomyopathy, unspecified: Secondary | ICD-10-CM | POA: Diagnosis not present

## 2014-07-14 DIAGNOSIS — Z7982 Long term (current) use of aspirin: Secondary | ICD-10-CM | POA: Diagnosis not present

## 2014-07-14 DIAGNOSIS — I10 Essential (primary) hypertension: Secondary | ICD-10-CM | POA: Diagnosis not present

## 2014-07-14 DIAGNOSIS — Z87891 Personal history of nicotine dependence: Secondary | ICD-10-CM | POA: Diagnosis not present

## 2014-07-14 DIAGNOSIS — J449 Chronic obstructive pulmonary disease, unspecified: Secondary | ICD-10-CM | POA: Diagnosis not present

## 2014-07-14 DIAGNOSIS — Z9079 Acquired absence of other genital organ(s): Secondary | ICD-10-CM | POA: Diagnosis not present

## 2014-07-14 DIAGNOSIS — Z8546 Personal history of malignant neoplasm of prostate: Secondary | ICD-10-CM | POA: Diagnosis not present

## 2014-07-14 DIAGNOSIS — I447 Left bundle-branch block, unspecified: Secondary | ICD-10-CM | POA: Diagnosis not present

## 2014-07-14 DIAGNOSIS — I5022 Chronic systolic (congestive) heart failure: Secondary | ICD-10-CM | POA: Diagnosis not present

## 2014-07-14 MED ORDER — MUPIROCIN 2 % EX OINT
TOPICAL_OINTMENT | Freq: Once | CUTANEOUS | Status: AC
  Administered 2014-07-17: 07:00:00 via NASAL
  Filled 2014-07-14: qty 22

## 2014-07-16 DIAGNOSIS — I429 Cardiomyopathy, unspecified: Secondary | ICD-10-CM | POA: Diagnosis not present

## 2014-07-16 DIAGNOSIS — E785 Hyperlipidemia, unspecified: Secondary | ICD-10-CM | POA: Diagnosis not present

## 2014-07-16 DIAGNOSIS — I5022 Chronic systolic (congestive) heart failure: Secondary | ICD-10-CM | POA: Diagnosis not present

## 2014-07-16 DIAGNOSIS — J449 Chronic obstructive pulmonary disease, unspecified: Secondary | ICD-10-CM | POA: Diagnosis not present

## 2014-07-16 DIAGNOSIS — J45909 Unspecified asthma, uncomplicated: Secondary | ICD-10-CM | POA: Diagnosis not present

## 2014-07-16 DIAGNOSIS — I10 Essential (primary) hypertension: Secondary | ICD-10-CM | POA: Diagnosis not present

## 2014-07-16 DIAGNOSIS — I272 Other secondary pulmonary hypertension: Secondary | ICD-10-CM | POA: Diagnosis not present

## 2014-07-16 DIAGNOSIS — M1990 Unspecified osteoarthritis, unspecified site: Secondary | ICD-10-CM | POA: Diagnosis not present

## 2014-07-16 DIAGNOSIS — I447 Left bundle-branch block, unspecified: Secondary | ICD-10-CM | POA: Diagnosis not present

## 2014-07-16 MED ORDER — CEFAZOLIN SODIUM-DEXTROSE 2-3 GM-% IV SOLR: 2.0000 g | INTRAVENOUS | Status: DC

## 2014-07-16 MED ORDER — SODIUM CHLORIDE 0.9 % IR SOLN
80.0000 mg | Status: DC
  Filled 2014-07-16: qty 2

## 2014-07-17 ENCOUNTER — Encounter (HOSPITAL_COMMUNITY)
Admission: RE | Disposition: A | Discharge status: Home / Self Care | Payer: Self-pay | Source: Ambulatory Visit | Attending: Internal Medicine

## 2014-07-17 ENCOUNTER — Encounter (HOSPITAL_COMMUNITY): Payer: Self-pay | Admitting: General Practice

## 2014-07-17 ENCOUNTER — Ambulatory Visit (HOSPITAL_COMMUNITY): Payer: Commercial Managed Care - HMO

## 2014-07-17 ENCOUNTER — Ambulatory Visit (HOSPITAL_COMMUNITY)
Admission: RE | Admit: 2014-07-17 | Discharge: 2014-07-18 | Disposition: A | Discharge status: Home / Self Care | Payer: Commercial Managed Care - HMO | Source: Ambulatory Visit | Attending: Internal Medicine | Admitting: Internal Medicine

## 2014-07-17 DIAGNOSIS — Z95810 Presence of automatic (implantable) cardiac defibrillator: Secondary | ICD-10-CM | POA: Diagnosis not present

## 2014-07-17 DIAGNOSIS — E785 Hyperlipidemia, unspecified: Secondary | ICD-10-CM | POA: Insufficient documentation

## 2014-07-17 DIAGNOSIS — I272 Other secondary pulmonary hypertension: Secondary | ICD-10-CM | POA: Insufficient documentation

## 2014-07-17 DIAGNOSIS — I429 Cardiomyopathy, unspecified: Secondary | ICD-10-CM

## 2014-07-17 DIAGNOSIS — Z87891 Personal history of nicotine dependence: Secondary | ICD-10-CM | POA: Insufficient documentation

## 2014-07-17 DIAGNOSIS — J449 Chronic obstructive pulmonary disease, unspecified: Secondary | ICD-10-CM | POA: Insufficient documentation

## 2014-07-17 DIAGNOSIS — J45909 Unspecified asthma, uncomplicated: Secondary | ICD-10-CM | POA: Insufficient documentation

## 2014-07-17 DIAGNOSIS — Z7982 Long term (current) use of aspirin: Secondary | ICD-10-CM | POA: Insufficient documentation

## 2014-07-17 DIAGNOSIS — M1990 Unspecified osteoarthritis, unspecified site: Secondary | ICD-10-CM | POA: Diagnosis not present

## 2014-07-17 DIAGNOSIS — Z8546 Personal history of malignant neoplasm of prostate: Secondary | ICD-10-CM | POA: Insufficient documentation

## 2014-07-17 DIAGNOSIS — I5022 Chronic systolic (congestive) heart failure: Secondary | ICD-10-CM | POA: Diagnosis not present

## 2014-07-17 DIAGNOSIS — Z79899 Other long term (current) drug therapy: Secondary | ICD-10-CM | POA: Insufficient documentation

## 2014-07-17 DIAGNOSIS — I10 Essential (primary) hypertension: Secondary | ICD-10-CM | POA: Diagnosis not present

## 2014-07-17 DIAGNOSIS — I428 Other cardiomyopathies: Secondary | ICD-10-CM

## 2014-07-17 DIAGNOSIS — Z9079 Acquired absence of other genital organ(s): Secondary | ICD-10-CM | POA: Insufficient documentation

## 2014-07-17 DIAGNOSIS — I447 Left bundle-branch block, unspecified: Secondary | ICD-10-CM | POA: Insufficient documentation

## 2014-07-17 HISTORY — DX: Chronic systolic (congestive) heart failure: I50.22

## 2014-07-17 HISTORY — DX: Other cardiomyopathies: I42.8

## 2014-07-17 HISTORY — DX: Pneumonia, unspecified organism: J18.9

## 2014-07-17 HISTORY — DX: Presence of automatic (implantable) cardiac defibrillator: Z95.810

## 2014-07-17 HISTORY — DX: Left bundle-branch block, unspecified: I44.7

## 2014-07-17 HISTORY — PX: BI-VENTRICULAR IMPLANTABLE CARDIOVERTER DEFIBRILLATOR: SHX5459

## 2014-07-17 LAB — SURGICAL PCR SCREEN
MRSA, PCR: NEGATIVE
Staphylococcus aureus: NEGATIVE

## 2014-07-17 SURGERY — BI-VENTRICULAR IMPLANTABLE CARDIOVERTER DEFIBRILLATOR  (CRT-D)
Anesthesia: LOCAL

## 2014-07-17 MED ORDER — CARVEDILOL 3.125 MG PO TABS
3.1250 mg | ORAL_TABLET | Freq: Two times a day (BID) | ORAL | Status: DC
  Administered 2014-07-17 – 2014-07-18 (×3): 3.125 mg via ORAL
  Filled 2014-07-17 (×3): qty 1

## 2014-07-17 MED ORDER — LIDOCAINE HCL (PF) 1 % IJ SOLN
INTRAMUSCULAR | Status: AC
  Filled 2014-07-17: qty 60

## 2014-07-17 MED ORDER — AMLODIPINE BESYLATE 10 MG PO TABS
10.0000 mg | ORAL_TABLET | Freq: Every day | ORAL | Status: DC
  Administered 2014-07-17 – 2014-07-18 (×2): 10 mg via ORAL
  Filled 2014-07-17 (×2): qty 1

## 2014-07-17 MED ORDER — AMLODIPINE-OLMESARTAN 10-40 MG PO TABS: 1.0000 | ORAL_TABLET | Freq: Every day | ORAL | Status: DC

## 2014-07-17 MED ORDER — SODIUM CHLORIDE 0.9 % IJ SOLN: 3.0000 mL | INTRAMUSCULAR | Status: DC | PRN

## 2014-07-17 MED ORDER — CEFAZOLIN SODIUM-DEXTROSE 2-3 GM-% IV SOLR
2.0000 g | Freq: Four times a day (QID) | INTRAVENOUS | Status: AC
  Administered 2014-07-17 – 2014-07-18 (×3): 2 g via INTRAVENOUS
  Filled 2014-07-17 (×3): qty 50

## 2014-07-17 MED ORDER — IRBESARTAN 150 MG PO TABS
300.0000 mg | ORAL_TABLET | Freq: Every day | ORAL | Status: DC
  Administered 2014-07-17 – 2014-07-18 (×2): 300 mg via ORAL
  Filled 2014-07-17 (×2): qty 2

## 2014-07-17 MED ORDER — SODIUM CHLORIDE 0.9 % IV SOLN
250.0000 mL | INTRAVENOUS | Status: DC
  Administered 2014-07-17: 07:00:00 via INTRAVENOUS

## 2014-07-17 MED ORDER — ONDANSETRON HCL 4 MG/2ML IJ SOLN: 4.0000 mg | Freq: Four times a day (QID) | INTRAMUSCULAR | Status: DC | PRN

## 2014-07-17 MED ORDER — SODIUM CHLORIDE 0.9 % IV SOLN
INTRAVENOUS | Status: DC
  Administered 2014-07-17: 07:00:00 via INTRAVENOUS

## 2014-07-17 MED ORDER — MIDAZOLAM HCL 5 MG/5ML IJ SOLN
INTRAMUSCULAR | Status: AC
  Filled 2014-07-17: qty 5

## 2014-07-17 MED ORDER — CHLORHEXIDINE GLUCONATE 4 % EX LIQD
60.0000 mL | Freq: Once | CUTANEOUS | Status: DC
  Filled 2014-07-17: qty 60

## 2014-07-17 MED ORDER — CEFAZOLIN SODIUM-DEXTROSE 2-3 GM-% IV SOLR
INTRAVENOUS | Status: AC
  Filled 2014-07-17: qty 50

## 2014-07-17 MED ORDER — SODIUM CHLORIDE 0.9 % IJ SOLN
3.0000 mL | Freq: Two times a day (BID) | INTRAMUSCULAR | Status: DC
Start: 2014-07-17 — End: 2014-07-17

## 2014-07-17 MED ORDER — HEPARIN (PORCINE) IN NACL 2-0.9 UNIT/ML-% IJ SOLN
INTRAMUSCULAR | Status: AC
  Filled 2014-07-17: qty 500

## 2014-07-17 MED ORDER — ATORVASTATIN CALCIUM 40 MG PO TABS
40.0000 mg | ORAL_TABLET | Freq: Every day | ORAL | Status: DC
  Administered 2014-07-17 – 2014-07-18 (×2): 40 mg via ORAL
  Filled 2014-07-17 (×2): qty 1

## 2014-07-17 MED ORDER — VITAMIN E 180 MG (400 UNIT) PO CAPS
400.0000 [IU] | ORAL_CAPSULE | Freq: Every day | ORAL | Status: DC
  Administered 2014-07-17 – 2014-07-18 (×2): 400 [IU] via ORAL
  Filled 2014-07-17 (×2): qty 1

## 2014-07-17 MED ORDER — MUPIROCIN 2 % EX OINT
TOPICAL_OINTMENT | CUTANEOUS | Status: AC
  Filled 2014-07-17: qty 22

## 2014-07-17 MED ORDER — THEOPHYLLINE ER 200 MG PO TB12
200.0000 mg | ORAL_TABLET | Freq: Two times a day (BID) | ORAL | Status: DC
  Administered 2014-07-17 – 2014-07-18 (×3): 200 mg via ORAL
  Filled 2014-07-17 (×4): qty 1

## 2014-07-17 MED ORDER — ACETAMINOPHEN 325 MG PO TABS
325.0000 mg | ORAL_TABLET | ORAL | Status: DC | PRN
  Administered 2014-07-17 (×2): 650 mg via ORAL
  Filled 2014-07-17 (×2): qty 2

## 2014-07-17 MED ORDER — FENTANYL CITRATE 0.05 MG/ML IJ SOLN
INTRAMUSCULAR | Status: AC
  Filled 2014-07-17: qty 2

## 2014-07-17 NOTE — H&P (Signed)
  ICD Criteria  Current LVEF:25% ;Obtained > or = 1 month ago and < or = 3 months ago.  NYHA Functional Classification: Class III  Heart Failure History:  Yes, Duration of heart failure since onset is > 9 months  Non-Ischemic Dilated Cardiomyopathy History:  Yes, timeframe is 3 to 9 months  Atrial Fibrillation/Atrial Flutter:  No.  Ventricular Tachycardia History:  No.  Cardiac Arrest History:  No  History of Syndromes with Risk of Sudden Death:  No.  Previous ICD:  No.  Electrophysiology Study: No.  Prior MI: No.  PPM: No.  OSA:  No  Patient Life Expectancy of >=1 year: Yes.  Anticoagulation Therapy:  Patient is NOT on anticoagulation therapy.   Beta Blocker Therapy:  Yes.   Ace Inhibitor/ARB Therapy:  Yes.

## 2014-07-17 NOTE — Interval H&P Note (Signed)
History and Physical Interval Note:  07/17/2014 7:19 AM  [name]  has presented today for surgery, with the diagnosis of non-ischemic cardiomyopathy, left bundle branch block  The various methods of treatment have been discussed with the patient and family. After consideration of risks, benefits and other options for treatment, the patient has consented to  Procedure(s): BI-VENTRICULAR IMPLANTABLE CARDIOVERTER DEFIBRILLATOR  (CRT-D) (N/A) as a surgical intervention .  The patient's history has been reviewed, patient examined, no change in status, stable for surgery.  I have reviewed the patient's chart and labs.  Questions were answered to the patient's satisfaction.     Mikle [email or web address].D.

## 2014-07-17 NOTE — CV Procedure (Signed)
SURGEON:  Cristopher Peru, MD      PREPROCEDURE DIAGNOSES:   1. Nonischemic cardiomyopathy.   2. New York Heart Association class III, heart failure chronically.   3. Left bundle-branch block.      POSTPROCEDURE DIAGNOSES:   1. Nonischemic cardiomyopathy.   2. New York Heart Association class III heart failure chronically.   3. Left bundle-branch block.      PROCEDURES:    1. Biventricular ICD implantation.  2. Defibrillation threshold testing 3. Venography of the coronary sinus 4. Venography of the left upper extremity     INTRODUCTION:  [name] is a 80 y.o. male with a nonischemic CM (EF 25), NYHA Class III CHF, and LBBB QRS morophology. At this time, he meets MADIT II/ SCD-HeFT criteria for ICD implantation for primary prevention of sudden death.  Given LBBB, the patient may also be expected to benefit from resynchronization therapy. The patient has been treated with an optimal medical regimen but continues to have a depressed ejection fraction and NYHA Class III CHF symptoms.  he therefore  presents today for a biventricular ICD implantation.      DESCRIPTION OF PROCEDURE:  Informed written consent was obtained and the   patient was brought to the electrophysiology lab in the fasting state. The patient was adequately sedated with intravenous Versed and Fentanyl as outlined in the nursing report.  The patient's left chest was prepped and draped in the usual sterile fashion by the EP lab staff.  The skin overlying the left deltopectoral region was infiltrated with lidocaine for local analgesia.  A 6-cm incision was made over the left deltopectoral region.  A left subcutaneous defibrillator pocket was fashioned using a combination of sharp and blunt dissection.  Electrocautery was used to assure hemostasis.   Left Upper extremity Venography:  A venogram of the left upper extremity was performed after we had trouble accessing the left subclavian vein which revealed a large  caudally displaced left axillary vein which emptied into a large left subclavian vein.    RA/RV Lead Placement: The left axillary vein was cannulated with fluoroscopic visualization.  No contrast was required for this endeavor.  Through the left axillary vein, a St. Jude [id number] (serial # [id number]  ) right atrial lead and a St. Jude [id number] (serial number [id number]) right ventricular defibrillator lead were advanced with fluoroscopic visualization into the right atrial appendage and right ventricular apical septal positions respectively.  Initial atrial lead P-waves measured 5 mV with an impedance of 383 ohms and a threshold of 0.6 volts at 0.5 milliseconds.  The right ventricular lead R-wave measured 26 mV with impedance of 731 ohms and a threshold of 0.6 volts at 0.5 milliseconds.   LV Lead Placement:  A St.Jude guide was advanced through the left axillary vein into the low lateral right atrium. A 6 french hexapolar EP catheter was introduced through the Datto. Jude guide and used to cannulate the coronary sinus. A coronary sinus nonselective venogram was performed by hand injection of nonionic contrast. This demonstrated a small CS and a small lateral vein.  A 0.014 angioplasty guide wire was introduced through the St. Jude Guide and advanced into the lateral vein. A St. Jude(serial number [id number]) lead was advanced through the coronary sinus into the distal lateral branch. This was approximately one half from the base to the apex in a very lateral  position. In this location with 3-2 bipolar configuration, the left ventricular lead R-waves measured 7 mV with  impedance of 592 ohms and a threshold of 0.8 volt at 0.5 Milliseconds with no diaphragmatic stimulation observed when pacing at 10 volts output. The St. Jude guide was therefore removed.  All three leads were secured to the pectoralis fascia using #2 silk suture over the suture sleeves. The pocket then irrigated with copious gentamicin solution.    Device Placement: The leads were then  connected to a St. Jude (serial  Number [id number]) biventricular ICD.  The defibrillator was placed into the  pocket.  The pocket was then closed in 2 layers with 2.0 Vicryl suture  for the subcutaneous and subcuticular layers.  Steri-Strips and a  sterile dressing were then applied.   DFT Testing: Defibrillation Threshold testing was then performed. Ventricular fibrillation was induced with a T shock.  Adequate sensing of ventricular  fibrillation was observed with minimal dropout with a programmed sensitivity of 1.104mV.  The patient was successfully defibrillated to sinus rhythm with a single 20 joules shock delivered from the device with an impedance of 66 ohms in a duration of 4.5 seconds.  The patient remained in sinus rhythm thereafter.  There were no early apparent complications.     CONCLUSIONS:   1. Nonischemic cardiomyopathy with Left bundle-branch block and chronic New York Heart Association class III heart failure.   2. Successful biventricular ICD implantation.   3. DFT less than or equal to 20 joules.   4. No early apparent complications.   Cristopher Peru, MD  9:16 AM 07/17/2014

## 2014-07-17 NOTE — Progress Notes (Addendum)
Patient had several runs of [place].  Tarri Fuller, PA stated to give patient his home dose of coreg.  Coreg was given and Dr. Lovena Le also notified.

## 2014-07-17 NOTE — H&P (View-Only) (Signed)
HPI [name] is referred today by Dr. Harrington Challenger for evaluation of chronic systolic heart failure in the setting of LBBB. The patient is a 80 yo man with chronic systolic heart failure who has had symptoms for over a year which have gradually worsened despite medical therapy. His EF is 25% and he has moderate pulmonary HTN. The patient has LBBB on his ECG. He has not had syncope. No peripheral edema. He has class 3 symptoms getting sob when walking on flat ground more than 50 ft. he denies cough or hemoptysis. He has been on maximal medical therapy with a beta blocker and an ARB. No Known Allergies   Current Outpatient Prescriptions  Medication Sig Dispense Refill  . amLODipine-olmesartan (AZOR) 10-40 MG per tablet Take 1 tablet by mouth daily. 90 tablet 3  . aspirin 81 MG EC tablet Take 81 mg by mouth daily.      [name] Kitchen atorvastatin (LIPITOR) 40 MG tablet Take 1 tablet (40 mg total) by mouth daily. 90 tablet 3  . carvedilol (COREG) 3.125 MG tablet Take 1/2 tab in the AM and 1/2 tab in the PM for a total of 3.125mg  daily. 30 tablet 3  . DULERA 100-5 MCG/ACT AERO INHALE 2 PUFFS THROUGH SPACER TUBE THEN RINSE MOUTH TWICE DAILY 39 g 0  . folic acid (FOLVITE) 732 MCG tablet Take 400 mcg by mouth every evening.      . Multiple Vitamins-Minerals (MULTIVITAMIN,TX-MINERALS) tablet Take 1 tablet by mouth daily.      [name] (SYSTANE OP) Place 1 drop into both eyes daily.     [name] Kitchen Spacer/Aero-Holding Chambers (AEROCHAMBER MV) inhaler Use as instructed 1 each 0  . theophylline (THEODUR) 200 MG 12 hr tablet Take 200 mg by mouth 2 (two) times daily.    . vitamin E 400 UNIT capsule Take 400 Units by mouth daily.       No current facility-administered medications for this visit.     Past Medical History  Diagnosis Date  . Asthma   . COPD (chronic obstructive pulmonary disease)     FEV1 2.68/72%, FEV1/FVC 0.63 (08/22/04  . Colon polyps   . Hyperlipidemia   . HTN (hypertension)     . Prostate cancer 2010  . Arthritis     ROS:   All systems reviewed and negative except as noted in the HPI.   Past Surgical History  Procedure Laterality Date  . Prostatectomy  2010    robot, laparoscopic for prostate cancer  . Cataract extraction w/ intraocular lens  implant, bilateral Bilateral 2010  . Left heart catheterization with coronary angiogram [name] 01/04/2014    Procedure: LEFT HEART CATHETERIZATION WITH CORONARY ANGIOGRAM;  Surgeon: Burnell Blanks, MD;  Location[facility] CATH LAB;  Service: Cardiovascular;  Laterality: [name];     Family History  Problem Relation Age of Onset  . Arthritis Other   . Esophagitis Mother     STRICTURE  . Colon cancer Father 79  . Prostate cancer Father   . Cancer Father     prostate  . Heart attack Neg Hx   . Stroke Mother   . Hypertension Mother   . Hypertension Father      History   Social History  . Marital Status: Married    Spouse Name: [name]  . Number of Children: [name]  . Years of Education: [name]   Occupational History  . Not on file.   Social History Main Topics  . Smoking status:  Former Smoker -- 1.50 packs/day for 40 years    Types: Cigarettes    Quit date: 05/05/2002  . Smokeless tobacco: Never Used  . Alcohol Use: No  . Drug Use: No  . Sexual Activity: Not Currently   Other Topics Concern  . Not on file   Social History Narrative   Regular Exercise -  NO           BP 146/58 mmHg  Pulse 86  Ht 6\' 2"  (1.88 m)  Wt 241 lb (109.317 kg)  BMI 30.93 kg/m2  Physical Exam:  Well appearing 80 yo man, NAD HEENT: Unremarkable Neck:  7 cm JVD, no thyromegally Back:  No CVA tenderness Lungs:  Clear with no wheezes, rales or rhonchi HEART:  Regular rate rhythm, no murmurs, no rubs, no clicks, distant heart sounds Abd:  soft, positive bowel sounds, no organomegally, no rebound, no guarding Ext:  2 plus pulses, trace peripheral edema, no cyanosis, no clubbing Skin:  No rashes no nodules Neuro:  CN II  through XII intact, motor grossly intact  EKG - NSR with LBBB  Assess/Plan:

## 2014-07-17 NOTE — Progress Notes (Signed)
UR Completed Jhada Risk Graves-Bigelow, RN,BSN [phone]  

## 2014-07-18 ENCOUNTER — Ambulatory Visit (HOSPITAL_COMMUNITY): Payer: Commercial Managed Care - HMO

## 2014-07-18 DIAGNOSIS — M1990 Unspecified osteoarthritis, unspecified site: Secondary | ICD-10-CM | POA: Diagnosis not present

## 2014-07-18 DIAGNOSIS — E785 Hyperlipidemia, unspecified: Secondary | ICD-10-CM | POA: Diagnosis not present

## 2014-07-18 DIAGNOSIS — I429 Cardiomyopathy, unspecified: Secondary | ICD-10-CM | POA: Diagnosis not present

## 2014-07-18 DIAGNOSIS — I5022 Chronic systolic (congestive) heart failure: Secondary | ICD-10-CM | POA: Diagnosis not present

## 2014-07-18 DIAGNOSIS — Z95810 Presence of automatic (implantable) cardiac defibrillator: Secondary | ICD-10-CM | POA: Diagnosis not present

## 2014-07-18 DIAGNOSIS — J45909 Unspecified asthma, uncomplicated: Secondary | ICD-10-CM | POA: Diagnosis not present

## 2014-07-18 DIAGNOSIS — I10 Essential (primary) hypertension: Secondary | ICD-10-CM | POA: Diagnosis not present

## 2014-07-18 DIAGNOSIS — I272 Other secondary pulmonary hypertension: Secondary | ICD-10-CM | POA: Diagnosis not present

## 2014-07-18 DIAGNOSIS — J449 Chronic obstructive pulmonary disease, unspecified: Secondary | ICD-10-CM | POA: Diagnosis not present

## 2014-07-18 DIAGNOSIS — I447 Left bundle-branch block, unspecified: Secondary | ICD-10-CM | POA: Diagnosis not present

## 2014-07-18 DIAGNOSIS — J90 Pleural effusion, not elsewhere classified: Secondary | ICD-10-CM | POA: Diagnosis not present

## 2014-07-18 LAB — BASIC METABOLIC PANEL
Anion gap: 7 (ref 5–15)
BUN: 9 mg/dL (ref 6–23)
CO2: 25 mmol/L (ref 19–32)
Calcium: 8.3 mg/dL — ABNORMAL LOW (ref 8.4–10.5)
Chloride: 106 mmol/L (ref 96–112)
Creatinine, Ser: 1.01 mg/dL (ref 0.50–1.35)
GFR, EST AFRICAN AMERICAN: 81 mL/min — AB (ref >90)
GFR, EST NON AFRICAN AMERICAN: 70 mL/min — AB (ref >90)
Glucose, Bld: 113 mg/dL — ABNORMAL HIGH (ref 70–99)
POTASSIUM: 3.6 mmol/L (ref 3.5–5.1)
Sodium: 138 mmol/L (ref 135–145)

## 2014-07-18 LAB — MAGNESIUM: Magnesium: 1.7 mg/dL (ref 1.5–2.5)

## 2014-07-18 NOTE — Progress Notes (Signed)
Pts rhythm showed a multitude of PVCs and failure to captures. Morning EKG pulled, as ordered, and showed an abnormal EKG so a second one was collected. 2nd showed abnormal with PVCs and suspect unspecified pacemaker failure. Dr Aundra Dubin paged and informed, said it would be addressed on morning rounds, and ordered a BMP and Magnesium lab drawn, which were ordered stat. Will continue to monitor

## 2014-07-18 NOTE — Progress Notes (Signed)
Patient ID: [name], male   DOB: 10-25-1934, 80 y.o.   MRN: [id number]    Patient Name: [name] Date of Encounter: 07/18/2014     Active Problems:   Chronic systolic heart failure   S/P ICD (internal cardiac defibrillator) procedure    SUBJECTIVE  No chest pain or sob.   CURRENT MEDS . amLODipine  10 mg Oral Daily   And  . irbesartan  300 mg Oral Daily  . atorvastatin  40 mg Oral Daily  . carvedilol  3.125 mg Oral BID WC  . theophylline  200 mg Oral BID  . vitamin E  400 Units Oral Daily    OBJECTIVE  Filed Vitals:   07/17/14 2131 07/17/14 2141 07/18/14 0540 07/18/14 0812  BP:  122/68  141/99  Pulse:  83  84  Temp: 98.7 F (37.1 C)  98.9 F (37.2 C)   TempSrc: Oral  Oral   Resp:      Height:      Weight:   233 lb 7.5 oz (105.9 kg)   SpO2:  94%      Intake/Output Summary (Last 24 hours) at 07/18/14 0843 Last data filed at 07/18/14 0541  Gross per 24 hour  Intake      0 ml  Output    150 ml  Net   -150 ml   Filed Weights   07/17/14 0548 07/18/14 0540  Weight: 233 lb (105.688 kg) 233 lb 7.5 oz (105.9 kg)    PHYSICAL EXAM  General: Pleasant, NAD. Neuro: Alert and oriented X 3. Moves all extremities spontaneously. Psych: Normal affect. HEENT:  Normal  Neck: Supple without bruits or JVD. Lungs:  Resp regular and unlabored, CTA. Incision with no hematoma. Heart: RRR no s3, s4, or murmurs. Abdomen: Soft, non-tender, non-distended, BS + x 4.  Extremities: No clubbing, cyanosis or edema. DP/PT/Radials 2+ and equal bilaterally.  Accessory Clinical Findings  CBC No results for input(s): WBC, NEUTROABS, HGB, HCT, MCV, PLT in the last 72 hours. Basic Metabolic Panel  Recent Labs  07/18/14 0538  NA 138  K 3.6  CL 106  CO2 25  GLUCOSE 113*  BUN 9  CREATININE 1.01  CALCIUM 8.3*  MG 1.7   Liver Function Tests No results for input(s): AST, ALT, ALKPHOS, BILITOT, PROT, ALBUMIN in the last 72 hours. No results for input(s):  LIPASE, AMYLASE in the last 72 hours. Cardiac Enzymes No results for input(s): CKTOTAL, CKMB, CKMBINDEX, TROPONINI in the last 72 hours. BNP Invalid input(s): POCBNP D-Dimer No results for input(s): DDIMER in the last 72 hours. Hemoglobin A1C No results for input(s): HGBA1C in the last 72 hours. Fasting Lipid Panel No results for input(s): CHOL, HDL, LDLCALC, TRIG, CHOLHDL, LDLDIRECT in the last 72 hours. Thyroid Function Tests No results for input(s): TSH, T4TOTAL, T3FREE, THYROIDAB in the last 72 hours.  Invalid input(s): FREET3  TELE  NSR with biV pacing  Interogation - normal biV pacing function with PVC's.  Radiology/Studies  Dg Chest 1 View  07/17/2014   CLINICAL DATA:  Post ICD placement  EXAM: CHEST  1 VIEW  COMPARISON:  Portable exam 1617 hours compared to 02/07/2014  FINDINGS: LEFT subclavian pacemaker/AICD leads project over RIGHT atrium, RIGHT ventricle and coronary sinus.  Upper normal heart size.  Normal mediastinal contours and pulmonary vascularity.  Lungs clear.  No pleural effusion or pneumothorax.  Bones unremarkable.  IMPRESSION: No acute abnormalities.   Electronically Signed   By: Crist Infante.D.  On: 07/17/2014 16:32   Dg Chest 2 View  07/18/2014   CLINICAL DATA:  Internal cardiac defibrillator placement  EXAM: CHEST  2 VIEW  COMPARISON:  07/17/2014  FINDINGS: AICD in place with leads in the right atrium and right ventricle and coronary sinus. No pneumothorax. Negative for heart failure.  Mild right lower lobe atelectasis/ infiltrate. Small pleural effusions bilaterally.  IMPRESSION: AICD in satisfactory position.  Mild right lower lobe atelectasis/ infiltrate and small pleural effusions bilaterally.   Electronically Signed   By: Franchot Gallo M.D.   On: 07/18/2014 08:14    ASSESSMENT AND PLAN  1. Non-ischemic CM 2. Chronic class 3 CHF 3. S/p BIV ICD implant Rec: ok for discharge home with usual followup. I warned him on the use of his left  arm.    Gregg Taylor,M.D.  07/18/2014 8:43 AM

## 2014-07-18 NOTE — Discharge Summary (Signed)
ELECTROPHYSIOLOGY PROCEDURE DISCHARGE SUMMARY    Patient ID: [name],  MRN: [id number], DOB/AGE: [birth year] 78 y.o.  Admit date: 07/17/2014 Discharge date: 07/18/2014  Primary Care Physician: Scarlette Calico, MD Primary Cardiologist: Harrington Challenger Electrophysiologist: Lovena Le  Primary Discharge Diagnosis:  NICM, chronic systolic heart failure and LBBB s/p CRTD this admission  Secondary Discharge Diagnosis:  1.  HTN 2.  COPD 3.  Hyperlipidemia 4.  Prostate cancer 5.  Arthritis  No Known Allergies   Procedures This Admission:  1.  Implantation of a STJ CRTD on 07/17/14 by Dr Lovena Le.  The patient received a STJ Quadra Assura CRTD with model number 1888 right atrial lead, 7122 right ventricular lead, and 1458 left ventricular lead.  DFT's were successful at 77 J.  There were no immediate post procedure complications. 2.  CXR on 07/18/14 demonstrated no pneumothorax status post device implantation.   Brief HPI: [name] is a 80 y.o. male was referred to electrophysiology in the outpatient setting for consideration of CRTD implantation.  Past medical history includes non ischemic cardiomyopathy, LBBB, and class 3 chronic systolic heart failure.  The patient has persistent LV dysfunction despite guideline directed therapy.  Risks, benefits, and alternatives to ICD implantation were reviewed with the patient who wished to proceed.   Hospital Course:  The patient was admitted and underwent implantation of a STJ CRTD with details as outlined above. He was monitored on telemetry overnight which demonstrated SR with V pacing, PVC's.  Left chest was without hematoma or ecchymosis.  The device was interrogated and found to be functioning normally.  CXR was obtained and demonstrated no pneumothorax status post device implantation.  Wound care, arm mobility, and restrictions were reviewed with the patient.  The patient was examined and considered stable for discharge to home.   Lab work  demonstrated anemia.  He has been instructed to follow up with his PCP.   The patient's discharge medications include an ARB (Olmesartan) and beta blocker (Carvedilol).   Physical Exam: Filed Vitals:   07/17/14 2131 07/17/14 2141 07/18/14 0540 07/18/14 0812  BP:  122/68  141/99  Pulse:  83  84  Temp: 98.7 F (37.1 C)  98.9 F (37.2 C)   TempSrc: Oral  Oral   Resp:      Height:      Weight:   233 lb 7.5 oz (105.9 kg)   SpO2:  94%       Labs:   Lab Results  Component Value Date   WBC 5.1 07/10/2014   HGB 9.3* 07/10/2014   HCT 29.2* 07/10/2014   MCV 68.4 Repeated and verified X2.* 07/10/2014   PLT 345.0 07/10/2014    Recent Labs Lab 07/18/14 0538  NA 138  K 3.6  CL 106  CO2 25  BUN 9  CREATININE 1.01  CALCIUM 8.3*  GLUCOSE 113*    Discharge Medications:    Medication List    TAKE these medications        AEROCHAMBER MV inhaler  Use as instructed     amLODipine-olmesartan 10-40 MG per tablet  Commonly known as:  AZOR  Take 1 tablet by mouth daily.     aspirin 81 MG EC tablet  Take 81 mg by mouth daily.     atorvastatin 40 MG tablet  Commonly known as:  LIPITOR  Take 1 tablet (40 mg total) by mouth daily.     carvedilol 3.125 MG tablet  Commonly known as:  COREG  Take 1/2  tab in the AM and 1/2 tab in the PM for a total of 3.125mg  daily.     DULERA 100-5 MCG/ACT Aero  Generic drug:  mometasone-formoterol  INHALE 2 PUFFS THROUGH SPACER TUBE THEN RINSE MOUTH TWICE DAILY     folic acid 403 MCG tablet  Commonly known as:  FOLVITE  Take 400 mcg by mouth every evening.     multivitamin,tx-minerals tablet  Take 1 tablet by mouth daily.     SYSTANE OP  Place 1 drop into both eyes daily.     theophylline 200 MG 12 hr tablet  Commonly known as:  THEODUR  Take 200 mg by mouth 2 (two) times daily.     vitamin E 400 UNIT capsule  Take 400 Units by mouth daily.        Disposition:      Discharge Instructions    Diet - low sodium heart healthy     Complete by:  As directed      Increase activity slowly    Complete by:  As directed           Follow-up Information    Follow up with [facility] On 07/27/2014.   Why:  at [employer] information:   [place] [phone]       Follow up with Cristopher Peru, MD On 10/18/2014.   Specialty:  Cardiology   Why:  at [place] information:   [place] [address] [phone]       Duration of Discharge Encounter: Greater than 30 minutes including physician time.  Signed, Chanetta Marshall, NP 07/18/2014 10:17 AM

## 2014-07-18 NOTE — Discharge Instructions (Signed)
Supplemental Discharge Instructions for  Pacemaker/Defibrillator Patients  Activity No heavy lifting or vigorous activity with your left/right arm for 6 to 8 weeks.  Do not raise your left/right arm above your head for one week.  Gradually raise your affected arm as drawn below.           __       07-22-14                   07-23-14                     07-24-14                07-25-14  NO DRIVING for  1 week   ; you may begin driving on 05-28-56    .  WOUND CARE - Keep the wound area clean and dry.  Do not get this area wet for one week. No showers for one week; you may shower on 07-25-14    . - The tape/steri-strips on your wound will fall off; do not pull them off.  No bandage is needed on the site.  DO  NOT apply any creams, oils, or ointments to the wound area. - If you notice any drainage or discharge from the wound, any swelling or bruising at the site, or you develop a fever > 101? F after you are discharged home, call the office at once.  Special Instructions - You are still able to use cellular telephones; use the ear opposite the side where you have your pacemaker/defibrillator.  Avoid carrying your cellular phone near your device. - When traveling through airports, show security personnel your identification card to avoid being screened in the metal detectors.  Ask the security personnel to use the hand wand. - Avoid arc welding equipment, MRI testing (magnetic resonance imaging), TENS units (transcutaneous nerve stimulators).  Call the office for questions about other devices. - Avoid electrical appliances that are in poor condition or are not properly grounded. - Microwave ovens are safe to be near or to operate.  Additional information for defibrillator patients should your device go off: - If your device goes off ONCE and you feel fine afterward, notify the device clinic nurses. - If your device goes off ONCE and you do not feel well afterward, call 911. - If your device  goes off TWICE, call 911. - If your device goes off THREE times in one day, call 911.  DO NOT DRIVE YOURSELF OR A FAMILY MEMBER WITH A DEFIBRILLATOR TO THE HOSPITAL--CALL 911.   Cardiac Diet This diet can help prevent heart disease and stroke. Many factors influence your heart health, including eating and exercise habits. Coronary risk rises a lot with abnormal blood fat (lipid) levels. Cardiac meal planning includes limiting unhealthy fats, increasing healthy fats, and making other small dietary changes. General guidelines are as follows:  Adjust calorie intake to reach and maintain desirable body weight.  Limit total fat intake to less than 30% of total calories. Saturated fat should be less than 7% of calories.  Saturated fats are found in animal products and in some vegetable products. Saturated vegetable fats are found in coconut oil, cocoa butter, palm oil, and palm kernel oil. Read labels carefully to avoid these products as much as possible. Use butter in moderation. Choose tub margarines and oils that have 2 grams of fat or less. Good cooking oils are canola and olive oils.  Practice low-fat cooking techniques.  Do not fry food. Instead, broil, bake, boil, steam, grill, roast on a rack, stir-fry, or microwave it. Other fat reducing suggestions include:  Remove the skin from poultry.  Remove all visible fat from meats.  Skim the fat off stews, soups, and gravies before serving them.  Steam vegetables in water or broth instead of sauting them in fat.  Avoid foods with trans fat (or hydrogenated oils), such as commercially fried foods and commercially baked goods. Commercial shortening and deep-frying fats will contain trans fat.  Increase intake of fruits, vegetables, whole grains, and legumes to replace foods high in fat.  Increase consumption of nuts, legumes, and seeds to at least 4 servings weekly. One serving of a legume equals  cup, and 1 serving of nuts or seeds equals   cup.  Choose whole grains more often. Have 3 servings per day (a serving is 1 ounce [oz]).  Eat 4 to 5 servings of vegetables per day. A serving of vegetables is 1 cup of raw leafy vegetables;  cup of raw or cooked cut-up vegetables;  cup of vegetable juice.  Eat 4 to 5 servings of fruit per day. A serving of fruit is 1 medium whole fruit;  cup of dried fruit;  cup of fresh, frozen, or canned fruit;  cup of 100% fruit juice.  Increase your intake of dietary fiber to 20 to 30 grams per day. Insoluble fiber may help lower your risk of heart disease and may help curb your appetite.  Soluble fiber binds cholesterol to be removed from the blood. Foods high in soluble fiber are dried beans, citrus fruits, oats, apples, bananas, broccoli, Brussels sprouts, and eggplant.  Try to include foods fortified with plant sterols or stanols, such as yogurt, breads, juices, or margarines. Choose several fortified foods to achieve a daily intake of 2 to 3 grams of plant sterols or stanols.  Foods with omega-3 fats can help reduce your risk of heart disease. Aim to have a 3.5 oz portion of fatty fish twice per week, such as salmon, mackerel, albacore tuna, sardines, lake trout, or herring. If you wish to take a fish oil supplement, choose one that contains 1 gram of both DHA and EPA.  Limit processed meats to 2 servings (3 oz portion) weekly.  Limit the sodium in your diet to 1500 milligrams (mg) per day. If you have high blood pressure, talk to a registered dietitian about a DASH (Dietary Approaches to Stop Hypertension) eating plan.  Limit sweets and beverages with added sugar, such as soda, to no more than 5 servings per week. One serving is:   1 tablespoon sugar.  1 tablespoon jelly or jam.   cup sorbet.  1 cup lemonade.   cup regular soda. CHOOSING FOODS Starches  Allowed: Breads: All kinds (wheat, rye, raisin, white, oatmeal, [place], [place], and English muffin bread). Low-fat rolls:  English muffins, frankfurter and hamburger buns, bagels, pita bread, tortillas (not fried). Pancakes, waffles, biscuits, and muffins made with recommended oil.  Avoid: Products made with saturated or trans fats, oils, or whole milk products. Butter rolls, cheese breads, croissants. Commercial doughnuts, muffins, sweet rolls, biscuits, waffles, pancakes, store-bought mixes. Crackers  Allowed: Low-fat crackers and snacks: Animal, graham, rye, saltine (with recommended oil, no lard), oyster, and matzo crackers. Bread sticks, melba toast, rusks, flatbread, pretzels, and light popcorn.  Avoid: High-fat crackers: cheese crackers, butter crackers, and those made with coconut, palm oil, or trans fat (hydrogenated oils). Buttered popcorn. Cereals  Allowed: Hot or cold  whole-grain cereals.  Avoid: Cereals containing coconut, hydrogenated vegetable fat, or animal fat. Potatoes / Pasta / Rice  Allowed: All kinds of potatoes, rice, and pasta (such as macaroni, spaghetti, and noodles).  Avoid: Pasta or rice prepared with cream sauce or high-fat cheese. Chow mein noodles, [place] fries. Vegetables  Allowed: All vegetables and vegetable juices.  Avoid: Fried vegetables. Vegetables in cream, butter, or high-fat cheese sauces. Limit coconut. Fruit in cream or custard. Protein  Allowed: Limit your intake of meat, seafood, and poultry to no more than 6 oz (cooked weight) per day. All lean, well-trimmed beef, veal, pork, and lamb. All chicken and [place] without skin. All fish and shellfish. Wild game: wild duck, rabbit, pheasant, and venison. Egg whites or low-cholesterol egg substitutes may be used as desired. Meatless dishes: recipes with dried beans, peas, lentils, and tofu (soybean curd). Seeds and nuts: all seeds and most nuts.  Avoid: Prime grade and other heavily marbled and fatty meats, such as short ribs, spare ribs, rib eye roast or steak, frankfurters, sausage, bacon, and high-fat luncheon meats,  mutton. Caviar. Commercially fried fish. Domestic duck, goose, venison sausage. Organ meats: liver, gizzard, heart, chitterlings, brains, kidney, sweetbreads. Dairy  Allowed: Low-fat cheeses: nonfat or low-fat cottage cheese (1% or 2% fat), cheeses made with part skim milk, such as mozzarella, farmers, string, or ricotta. (Cheeses should be labeled no more than 2 to 6 grams fat per oz.). Skim (or 1%) milk: liquid, powdered, or evaporated. Buttermilk made with low-fat milk. Drinks made with skim or low-fat milk or cocoa. Chocolate milk or cocoa made with skim or low-fat (1%) milk. Nonfat or low-fat yogurt.  Avoid: Whole milk cheeses, including colby, cheddar, [place], Monterey Jack, [place], [place], [place], American, Swiss, and blue. Creamed cottage cheese, cream cheese. Whole milk and whole milk products, including buttermilk or yogurt made from whole milk, drinks made from whole milk. Condensed milk, evaporated whole milk, and 2% milk. Soups and Combination Foods  Allowed: Low-fat low-sodium soups: broth, dehydrated soups, homemade broth, soups with the fat removed, homemade cream soups made with skim or low-fat milk. Low-fat spaghetti, lasagna, chili, and Spanish rice if low-fat ingredients and low-fat cooking techniques are used.  Avoid: Cream soups made with whole milk, cream, or high-fat cheese. All other soups. Desserts and Sweets  Allowed: Sherbet, fruit ices, gelatins, meringues, and angel food cake. Homemade desserts with recommended fats, oils, and milk products. Jam, jelly, honey, marmalade, sugars, and syrups. Pure sugar candy, such as gum drops, hard candy, jelly beans, marshmallows, mints, and small amounts of dark chocolate.  Avoid: Commercially prepared cakes, pies, cookies, frosting, pudding, or mixes for these products. Desserts containing whole milk products, chocolate, coconut, lard, palm oil, or palm kernel oil. Ice cream or ice cream drinks. Candy that contains chocolate,  coconut, butter, hydrogenated fat, or unknown ingredients. Buttered syrups. Fats and Oils  Allowed: Vegetable oils: safflower, sunflower, corn, soybean, cottonseed, sesame, canola, olive, or peanut. Non-hydrogenated margarines. Salad dressing or mayonnaise: homemade or commercial, made with a recommended oil. Low or nonfat salad dressing or mayonnaise.  Limit added fats and oils to 6 to 8 tsp per day (includes fats used in cooking, baking, salads, and spreads on bread). Remember to count the "hidden fats" in foods.  Avoid: Solid fats and shortenings: butter, lard, salt pork, bacon drippings. Gravy containing meat fat, shortening, or suet. Cocoa butter, coconut. Coconut oil, palm oil, palm kernel oil, or hydrogenated oils: these ingredients are often used in bakery products, nondairy creamers,  whipped toppings, candy, and commercially fried foods. Read labels carefully. Salad dressings made of unknown oils, sour cream, or cheese, such as blue cheese and Roquefort. Cream, all kinds: half-and-half, light, heavy, or whipping. Sour cream or cream cheese (even if "light" or low-fat). Nondairy cream substitutes: coffee creamers and sour cream substitutes made with palm, palm kernel, hydrogenated oils, or coconut oil. Beverages  Allowed: Coffee (regular or decaffeinated), tea. Diet carbonated beverages, mineral water. Alcohol: Check with your caregiver. Moderation is recommended.  Avoid: Whole milk, regular sodas, and juice drinks with added sugar. Condiments  Allowed: All seasonings and condiments. Cocoa powder. "Cream" sauces made with recommended ingredients.  Avoid: Carob powder made with hydrogenated fats. SAMPLE MENU Breakfast   cup orange juice   cup oatmeal  1 slice toast  1 tsp margarine  1 cup skim milk Lunch  [place] sandwich with 2 oz [place], 2 slices bread  Lettuce and tomato slices  Fresh fruit  Carrot sticks  Coffee or tea Snack  Fresh fruit or low-fat  crackers Dinner  3 oz lean ground beef  1 baked potato  1 tsp margarine   cup asparagus  Lettuce salad  1 tbs non-creamy dressing   cup peach slices  1 cup skim milk Document Released: 01/29/2008 Document Revised: 10/21/2011 Document Reviewed: 06/21/2013 Exit[employer] Patient Information 2015 [place], [place]. This information is not intended to replace advice given to you by your health care provider. Make sure you discuss any questions you have with your health care provider.

## 2014-07-19 ENCOUNTER — Encounter (HOSPITAL_COMMUNITY): Payer: Self-pay | Admitting: Nurse Practitioner

## 2014-07-19 ENCOUNTER — Telehealth: Payer: Self-pay | Admitting: *Deleted

## 2014-07-19 NOTE — Telephone Encounter (Signed)
Pt was on TCM list. D/C 07/18/14 had Implantation of a STJ CRTD on 3/14. Pt will follow-up with his cardiology 07/27/14...[name]

## 2014-07-20 ENCOUNTER — Other Ambulatory Visit: Payer: Self-pay | Admitting: *Deleted

## 2014-07-20 MED ORDER — ATORVASTATIN CALCIUM 40 MG PO TABS: 40.0000 mg | ORAL_TABLET | Freq: Every day | ORAL | Status: DC

## 2014-07-27 ENCOUNTER — Telehealth: Payer: Self-pay | Admitting: Cardiology

## 2014-07-27 ENCOUNTER — Ambulatory Visit (HOSPITAL_COMMUNITY)
Admission: RE | Admit: 2014-07-27 | Discharge: 2014-07-27 | Disposition: A | Discharge status: Home / Self Care | Payer: Commercial Managed Care - HMO | Source: Ambulatory Visit | Attending: Internal Medicine | Admitting: Internal Medicine

## 2014-07-27 ENCOUNTER — Ambulatory Visit (INDEPENDENT_AMBULATORY_CARE_PROVIDER_SITE_OTHER): Payer: Commercial Managed Care - HMO | Admitting: *Deleted

## 2014-07-27 DIAGNOSIS — I255 Ischemic cardiomyopathy: Secondary | ICD-10-CM

## 2014-07-27 DIAGNOSIS — R0602 Shortness of breath: Secondary | ICD-10-CM | POA: Diagnosis not present

## 2014-07-27 DIAGNOSIS — Z95810 Presence of automatic (implantable) cardiac defibrillator: Secondary | ICD-10-CM | POA: Diagnosis not present

## 2014-07-27 LAB — MDC_IDC_ENUM_SESS_TYPE_INCLINIC
Brady Statistic RA Percent Paced: 1.6 %
Date Time Interrogation Session: 20160324140417
HighPow Impedance: 60.75 Ohm
Implantable Pulse Generator Serial Number: 7239374
Lead Channel Impedance Value: 537.5 Ohm
Lead Channel Pacing Threshold Amplitude: 0.5 V
Lead Channel Pacing Threshold Amplitude: 0.5 V
Lead Channel Pacing Threshold Amplitude: 2.25 V
Lead Channel Pacing Threshold Amplitude: 2.25 V
Lead Channel Pacing Threshold Pulse Width: 0.5 ms
Lead Channel Pacing Threshold Pulse Width: 0.5 ms
Lead Channel Pacing Threshold Pulse Width: 0.5 ms
Lead Channel Pacing Threshold Pulse Width: 1 ms
Lead Channel Pacing Threshold Pulse Width: 1 ms
Lead Channel Sensing Intrinsic Amplitude: 5 mV
Lead Channel Setting Pacing Amplitude: 2 V
Lead Channel Setting Pacing Amplitude: 3.5 V
Lead Channel Setting Pacing Pulse Width: 0.5 ms
Lead Channel Setting Pacing Pulse Width: 1 ms
Lead Channel Setting Sensing Sensitivity: 0.5 mV
MDC IDC MSMT BATTERY REMAINING LONGEVITY: 43.2 mo
MDC IDC MSMT LEADCHNL LV IMPEDANCE VALUE: 287.5 Ohm
MDC IDC MSMT LEADCHNL RA IMPEDANCE VALUE: 425 Ohm
MDC IDC MSMT LEADCHNL RA PACING THRESHOLD AMPLITUDE: 0.5 V
MDC IDC MSMT LEADCHNL RA PACING THRESHOLD PULSEWIDTH: 0.5 ms
MDC IDC MSMT LEADCHNL RV PACING THRESHOLD AMPLITUDE: 0.375 V
MDC IDC MSMT LEADCHNL RV SENSING INTR AMPL: 12 mV
MDC IDC SET LEADCHNL LV PACING AMPLITUDE: 4.5 V
MDC IDC STAT BRADY RV PERCENT PACED: 69 %
Zone Setting Detection Interval: 250 ms
Zone Setting Detection Interval: 300 ms

## 2014-07-27 NOTE — Telephone Encounter (Signed)
Informed pt that transmission was received.  

## 2014-07-28 NOTE — Progress Notes (Signed)
Wound check appointment. Steri-strips removed. Wound without redness or edema. Incision edges approximated, wound well healed. Normal device function. Thresholds, sensing, and impedances consistent with implant measurements for RA & RV leads. LV threshold elevated---attempted multiple vectors all resulting in moderately high capture. Inconsistent capture on same vectors---attempted changing iEGMs w/ same inconclusions. LV vector still M2-RVcoil w/ output changed to 4.5V@1 .36ms. Device programmed at 3.5V for extra safety margin until 3 month visit---turned on RV cap confirm. Histogram distribution appropriate for patient and level of activity. 10 mode switches--- <1%, longest 6sec. No ventricular arrhythmias noted. Patient educated about wound care, arm mobility, lifting restrictions, shock plan. ROV w/ Dr. Lovena Le 10/18/14 unless CXR determines otherwise. Pt sent for CXR.

## 2014-08-02 ENCOUNTER — Other Ambulatory Visit: Payer: Self-pay | Admitting: Internal Medicine

## 2014-08-07 DIAGNOSIS — N393 Stress incontinence (female) (male): Secondary | ICD-10-CM | POA: Diagnosis not present

## 2014-08-07 DIAGNOSIS — N5201 Erectile dysfunction due to arterial insufficiency: Secondary | ICD-10-CM | POA: Diagnosis not present

## 2014-08-07 DIAGNOSIS — C61 Malignant neoplasm of prostate: Secondary | ICD-10-CM | POA: Diagnosis not present

## 2014-08-09 ENCOUNTER — Ambulatory Visit: Payer: Commercial Managed Care - HMO | Admitting: Internal Medicine

## 2014-08-10 ENCOUNTER — Encounter: Payer: Self-pay | Admitting: Internal Medicine

## 2014-08-10 ENCOUNTER — Ambulatory Visit (INDEPENDENT_AMBULATORY_CARE_PROVIDER_SITE_OTHER): Payer: Commercial Managed Care - HMO | Admitting: Internal Medicine

## 2014-08-10 VITALS — BP 122/66 | HR 91 | Ht 73.0 in | Wt 229.8 lb

## 2014-08-10 DIAGNOSIS — J449 Chronic obstructive pulmonary disease, unspecified: Secondary | ICD-10-CM

## 2014-08-10 DIAGNOSIS — J309 Allergic rhinitis, unspecified: Secondary | ICD-10-CM | POA: Diagnosis not present

## 2014-08-10 DIAGNOSIS — J3089 Other allergic rhinitis: Secondary | ICD-10-CM

## 2014-08-10 DIAGNOSIS — J302 Other seasonal allergic rhinitis: Secondary | ICD-10-CM

## 2014-08-10 NOTE — Patient Instructions (Signed)
Ok to continue present meds  Ty otc antihistamine like Claritin/ loratadine as needed- perhaps try taking at bedtime to see if it helps the morning drainage

## 2014-08-10 NOTE — Progress Notes (Signed)
11/18/12- 71 former smoker  yoM re-establishing for COPD                            CP Dr Darene Lamer. Ronnald Ramp Has been using Anoro, Daliresp. FOLLOWS FOR: throat congestion, wheezing at night, occ prod cough with clear mucus x3days Main complaint is increased throat clearing for 6 weeks. Dr. Ronnald Ramp gave Daliresp, helpful but too expensive. Dulera changed to lAnoro, started shortly before throat became hoarse. Denies dysphagia or throat pain. Occasional sneeze. CXR 06/13/11 IMPRESSION:  Stable chest x-ray with slight hyperaeration. No active lung  disease.  Original Report Authenticated By: Joretta Bachelor, M.D.  01/04/13- 66 former smoker  yoM re-establishing for COPD                            PCP Dr Darene Lamer. Ronnald Ramp FOLLOWS FOR: has been using Dulera in the mornings for the most part(trying to get the second dose in at night); denies any wheezing, cough, congestion, or SOB today. Does not want flu vaccine "made him sick". Feels well controlled now with Dulera once daily. Medications reviewed. Daliresp was too expensive.  08/08/13- 79 former smoker  yoM re-establishing for COPD                            PCP Dr Darene Lamer. Ronnald Ramp ACUTE VISIT: continues to have "tickle" cough/congestion in back of throat. When lying down at night has to clear throat alot. Upper airway rattle/tickle cough lying down. Some dyspnea on exertion. Daliresp was too expensive. Using [facility]. CXR 01/05/13 IMPRESSION:  Underlying emphysema. No edema or consolidation.  Original Report Authenticated By: Lowella Grip, M.D.  02/07/14- 75 yoM  former smoker followed for COPD, complicated by DM2, CAD/CM, anemia/ FE          PCP Dr Darene Lamer. Ronnald Ramp FOLLOWS FOR: Pt states he is getting short winded all the time(walking up hills causes the most trouble). Had Myocardial imaging , then Echo showing EF 25-30% with akinesis, moderate Pulmonary hypertension, Grade 1 dCHF, LBBB Increased dyspnea on exertion especially hills and stairs. Occasional wheeze but not cough. Using  [facility] once or twice daily. Anemia Hgb 10.8 on BASA. Had colonoscopy Often forgets theophylline at breakfast time when he skips breakfast. Office Spirometry 02/07/14- moderate airway obstruction. FVC 3.45/84%, FEV1 1.86/60%, FEV1/FVC 0.54, FEF 25-75% 0.67/22%  08/10/14- 78 yoM  former smoker followed for COPD, complicated by DM2, CAD/CM, anemia/ FE          PCP Dr Alona Bene FOLLOWS FOR: PND with cough-clear in color in am. Noticing less energy and easier dyspnea on exertion with no significant acute event. He recognizes contribution from heart and from lungs. Minor occasional cough. Little wheeze. CXR 07/27/14-  IMPRESSION: No change in position of the AICD and pacer leads when compared to prior chest x-ray. No active cardiopulmonary disease. Electronically Signed  By: Ivar Drape M.D.  On: 07/27/2014 14:17  ROS-see HPI Constitutional:   No-   weight loss, night sweats, fevers, chills, fatigue, lassitude. HEENT:   No-  headaches, difficulty swallowing, tooth/dental problems, sore throat,       No-  sneezing, itching, ear ache, +nasal congestion, post nasal drip,  CV:  No-   chest pain, orthopnea, PND, swelling in lower extremities, anasarca, dizziness, palpitations Resp: + shortness of breath with exertion or at rest.  No-   productive cough,  + non-productive cough,  No- coughing up of blood.              No-   change in color of mucus.  No- wheezing.   Skin: No-   rash or lesions. GI:  No-   heartburn, indigestion, abdominal pain, nausea, vomiting, GU:  MS:  No-   joint pain or swelling.   Neuro-     nothing unusual Psych:  No- change in mood or affect. No depression or anxiety.  No memory loss.  OBJ- Physical Exam General- Alert, Oriented, Affect-appropriate, Distress- none acute Skin- rash-none, lesions- none, excoriation- none Lymphadenopathy- none Head- atraumatic            Eyes- Gross vision intact, PERRLA, conjunctivae and secretions clear            Ears-  Hearing, canals-normal            Nose- Clear, no-Septal dev, mucus, polyps, erosion, perforation             Throat- Mallampati III , mucosa clear , drainage- none, tonsils- atrophic Neck- flexible , trachea midline, no stridor , thyroid nl, carotid no bruit Chest - symmetrical excursion , unlabored           Heart/CV- RRR , no murmur , no gallop  , no rub, nl s1 s2                           - JVD- none , edema- none, stasis changes- none, varices- none           Lung- clear, wheeze- none, cough- none , dullness-none, rub- none           Chest wall-  L  AICD Abd-  Br/ Gen/ Rectal- Not done, not indicated Extrem- cyanosis- none, clubbing, none, atrophy- none, strength- nl Neuro- grossly intact to observation

## 2014-08-13 DIAGNOSIS — J302 Other seasonal allergic rhinitis: Secondary | ICD-10-CM | POA: Insufficient documentation

## 2014-08-13 DIAGNOSIS — J3089 Other allergic rhinitis: Secondary | ICD-10-CM

## 2014-08-13 NOTE — Assessment & Plan Note (Signed)
He recognizes some dyspnea on exertion which is expected with his cardiopulmonary disease. I don't see evidence of any acute change but there will be some slow progression over time.

## 2014-08-13 NOTE — Assessment & Plan Note (Signed)
Increased nasal congestion consistent with spring pollen timing. Options discussed. Plan-try Claritin

## 2014-08-22 ENCOUNTER — Encounter: Payer: Self-pay | Admitting: Internal Medicine

## 2014-08-22 ENCOUNTER — Other Ambulatory Visit: Payer: Self-pay | Admitting: Internal Medicine

## 2014-09-18 ENCOUNTER — Ambulatory Visit (INDEPENDENT_AMBULATORY_CARE_PROVIDER_SITE_OTHER): Payer: Commercial Managed Care - HMO | Admitting: Internal Medicine

## 2014-09-18 ENCOUNTER — Encounter: Payer: Self-pay | Admitting: Internal Medicine

## 2014-09-18 VITALS — BP 138/60 | HR 82 | Ht 74.0 in | Wt 230.0 lb

## 2014-09-18 DIAGNOSIS — J441 Chronic obstructive pulmonary disease with (acute) exacerbation: Secondary | ICD-10-CM | POA: Diagnosis not present

## 2014-09-18 DIAGNOSIS — J069 Acute upper respiratory infection, unspecified: Secondary | ICD-10-CM

## 2014-09-18 MED ORDER — PROMETHAZINE-CODEINE 6.25-10 MG/5ML PO SYRP: 5.0000 mL | ORAL_SOLUTION | Freq: Four times a day (QID) | ORAL | Status: DC | PRN

## 2014-09-18 MED ORDER — AMOXICILLIN-POT CLAVULANATE 875-125 MG PO TABS: 1.0000 | ORAL_TABLET | Freq: Two times a day (BID) | ORAL | Status: DC

## 2014-09-18 NOTE — Patient Instructions (Signed)
Script sent for augmentin to treat sinus infection  If this doesn't get you better please let us know  Script for cough syrup

## 2014-09-18 NOTE — Progress Notes (Signed)
11/18/12- 79 former smoker  yoM re-establishing for COPD                            CP Dr Darene Lamer. Ronnald Ramp Has been using Anoro, Daliresp. FOLLOWS FOR: throat congestion, wheezing at night, occ prod cough with clear mucus x3days Main complaint is increased throat clearing for 6 weeks. Dr. Ronnald Ramp gave Daliresp, helpful but too expensive. Dulera changed to lAnoro, started shortly before throat became hoarse. Denies dysphagia or throat pain. Occasional sneeze. CXR 06/13/11 IMPRESSION:  Stable chest x-ray with slight hyperaeration. No active lung  disease.  Original Report Authenticated By: Joretta Bachelor, M.D.  01/04/13- 18 former smoker  yoM re-establishing for COPD                            PCP Dr Darene Lamer. Ronnald Ramp FOLLOWS FOR: has been using Dulera in the mornings for the most part(trying to get the second dose in at night); denies any wheezing, cough, congestion, or SOB today. Does not want flu vaccine "made him sick". Feels well controlled now with Dulera once daily. Medications reviewed. Daliresp was too expensive.  08/08/13- 84 former smoker  yoM re-establishing for COPD                            PCP Dr Darene Lamer. Ronnald Ramp ACUTE VISIT: continues to have "tickle" cough/congestion in back of throat. When lying down at night has to clear throat alot. Upper airway rattle/tickle cough lying down. Some dyspnea on exertion. Daliresp was too expensive. Using [facility]. CXR 01/05/13 IMPRESSION:  Underlying emphysema. No edema or consolidation.  Original Report Authenticated By: Lowella Grip, M.D.  02/07/14- 49 yoM  former smoker followed for COPD, complicated by DM2, CAD/CM, anemia/ FE          PCP Dr Darene Lamer. Ronnald Ramp FOLLOWS FOR: Pt states he is getting short winded all the time(walking up hills causes the most trouble). Had Myocardial imaging , then Echo showing EF 25-30% with akinesis, moderate Pulmonary hypertension, Grade 1 dCHF, LBBB Increased dyspnea on exertion especially hills and stairs. Occasional wheeze but not cough. Using  [facility] once or twice daily. Anemia Hgb 10.8 on BASA. Had colonoscopy Often forgets theophylline at breakfast time when he skips breakfast. Office Spirometry 02/07/14- moderate airway obstruction. FVC 3.45/84%, FEV1 1.86/60%, FEV1/FVC 0.54, FEF 25-75% 0.67/22%  08/10/14- 78 yoM  former smoker followed for COPD, complicated by DM2, CAD/CM, anemia/ FE          PCP Dr Alona Bene FOLLOWS FOR: PND with cough-clear in color in am. Noticing less energy and easier dyspnea on exertion with no significant acute event. He recognizes contribution from heart and from lungs. Minor occasional cough. Little wheeze. CXR 07/27/14-  IMPRESSION: No change in position of the AICD and pacer leads when compared to prior chest x-ray. No active cardiopulmonary disease. Electronically Signed  By: Ivar Drape M.D.  On: 07/27/2014 14:17  09/18/14- 09/18/14- 68 yoM  former smoker followed for COPD, complicated by DM2, CAD/CM/ AICD/ pacer, anemia/ FE          PCP Dr Darene Lamer. Ronnald Ramp Follows For: Pt states breathing is doing fine. He c/o of a prod cough with yellow thick mucus, he says cough worsens at bedtime and early mornings. SOB with exertion. At night he notices postnasal drip, sleeping on 2 pillows. Some cough. Denies fever, headache or sore  throat. His cough at night bothers his wife. CXR 07/27/14- IMPRESSION: No change in position of the AICD and pacer leads when compared to prior chest x-ray. No active cardiopulmonary disease. Electronically Signed  By: Ivar Drape M.D.  On: 07/27/2014 14:17  ROS-see HPI Constitutional:   No-   weight loss, night sweats, fevers, chills, fatigue, lassitude. HEENT:   No-  headaches, difficulty swallowing, tooth/dental problems, sore throat,       No-  sneezing, itching, ear ache, +nasal congestion, post nasal drip,  CV:  No-   chest pain, orthopnea, PND, swelling in lower extremities, anasarca, dizziness, palpitations Resp: + shortness of breath with exertion or at rest.              +   productive cough,  + non-productive cough,  No- coughing up of blood.              +  change in color of mucus.  No- wheezing.   Skin: No-   rash or lesions. GI:  No-   heartburn, indigestion, abdominal pain, nausea, vomiting, GU:  MS:  No-   joint pain or swelling.   Neuro-     nothing unusual Psych:  No- change in mood or affect. No depression or anxiety.  No memory loss.  OBJ- Physical Exam General- Alert, Oriented, Affect-appropriate, Distress- none acute Skin- rash-none, lesions- none, excoriation- none Lymphadenopathy- none Head- atraumatic            Eyes- Gross vision intact, PERRLA, conjunctivae and secretions clear            Ears- Hearing, canals-normal            Nose- Clear, no-Septal dev, mucus, polyps, erosion, perforation             Throat- Mallampati III , mucosa clear , drainage + green, tonsils- atrophic Neck- flexible , trachea midline, no stridor , thyroid nl, carotid no bruit Chest - symmetrical excursion , unlabored           Heart/CV- RRR , no murmur , no gallop  , no rub, nl s1 s2                           - JVD- none , edema- none, stasis changes- none, varices- none           Lung- clear, wheeze- none, cough + deep , dullness-none, rub- none           Chest wall-  L  AICD Abd-  Br/ Gen/ Rectal- Not done, not indicated Extrem- cyanosis- none, clubbing, none, atrophy- none, strength- nl Neuro- grossly intact to observation

## 2014-10-08 NOTE — Assessment & Plan Note (Signed)
Acute bronchitis associated with acute upper respiratory infection/sinusitis Plan-Augmentin

## 2014-10-08 NOTE — Assessment & Plan Note (Signed)
Exam is consistent with an acute maxillary sinusitis with purulent postnasal drainage Plan-treated as acute bilateral maxillary sinusitis with Augmentin

## 2014-10-09 ENCOUNTER — Other Ambulatory Visit: Payer: Self-pay | Admitting: Internal Medicine

## 2014-10-18 ENCOUNTER — Encounter: Payer: Commercial Managed Care - HMO | Admitting: Internal Medicine

## 2014-10-20 ENCOUNTER — Ambulatory Visit (INDEPENDENT_AMBULATORY_CARE_PROVIDER_SITE_OTHER): Payer: Commercial Managed Care - HMO | Admitting: Internal Medicine

## 2014-10-20 ENCOUNTER — Encounter: Payer: Self-pay | Admitting: Internal Medicine

## 2014-10-20 VITALS — BP 144/72 | HR 78 | Ht 74.0 in | Wt 232.8 lb

## 2014-10-20 DIAGNOSIS — I5022 Chronic systolic (congestive) heart failure: Secondary | ICD-10-CM

## 2014-10-20 DIAGNOSIS — Z95810 Presence of automatic (implantable) cardiac defibrillator: Secondary | ICD-10-CM

## 2014-10-20 DIAGNOSIS — J449 Chronic obstructive pulmonary disease, unspecified: Secondary | ICD-10-CM

## 2014-10-20 DIAGNOSIS — I10 Essential (primary) hypertension: Secondary | ICD-10-CM | POA: Diagnosis not present

## 2014-10-20 LAB — CUP PACEART INCLINIC DEVICE CHECK
HIGH POWER IMPEDANCE MEASURED VALUE: 61.875
Lead Channel Impedance Value: 412.5 Ohm
Lead Channel Impedance Value: 425 Ohm
Lead Channel Impedance Value: 487.5 Ohm
Lead Channel Pacing Threshold Amplitude: 0.75 V
Lead Channel Pacing Threshold Amplitude: 1 V
Lead Channel Pacing Threshold Pulse Width: 0.5 ms
Lead Channel Sensing Intrinsic Amplitude: 5 mV
Lead Channel Setting Pacing Amplitude: 2 V
Lead Channel Setting Pacing Amplitude: 2 V
Lead Channel Setting Pacing Pulse Width: 0.6 ms
Lead Channel Setting Sensing Sensitivity: 0.5 mV
MDC IDC MSMT BATTERY REMAINING LONGEVITY: 76.8 mo
MDC IDC MSMT LEADCHNL LV PACING THRESHOLD PULSEWIDTH: 0.6 ms
MDC IDC MSMT LEADCHNL RV PACING THRESHOLD AMPLITUDE: 0.5 V
MDC IDC MSMT LEADCHNL RV PACING THRESHOLD PULSEWIDTH: 0.5 ms
MDC IDC MSMT LEADCHNL RV SENSING INTR AMPL: 12 mV
MDC IDC SESS DTM: 20160617115041
MDC IDC SET LEADCHNL RV PACING AMPLITUDE: 2 V
MDC IDC SET LEADCHNL RV PACING PULSEWIDTH: 0.5 ms
MDC IDC STAT BRADY RA PERCENT PACED: 3 %
MDC IDC STAT BRADY RV PERCENT PACED: 81 %
Pulse Gen Serial Number: 7239374
Zone Setting Detection Interval: 250 ms
Zone Setting Detection Interval: 300 ms

## 2014-10-20 MED ORDER — CARVEDILOL 3.125 MG PO TABS: 3.1250 mg | ORAL_TABLET | Freq: Two times a day (BID) | ORAL | Status: DC

## 2014-10-20 NOTE — Assessment & Plan Note (Signed)
His [employer]. Jude biv ICD is working normally. His output on his LV lead has been turned down as his threshold is no longer elevated.

## 2014-10-20 NOTE — Assessment & Plan Note (Signed)
His blood pressure is elevated. I have asked him to reduce his sodium intake. We will uptitrate his beta blocker.

## 2014-10-20 NOTE — Assessment & Plan Note (Signed)
He is at his baseline and will continue his bronchodilators.

## 2014-10-20 NOTE — Patient Instructions (Signed)
Medication Instructions:  Your physician has recommended you make the following change in your medication:  1) Increase Carvedilol to 1/2 tablet in the morning and 1 whole tablet in the pm for 2 weeks then increase to 1 tablet twice daily   Labwork: None ordered  Testing/Procedures: None ordered  Follow-Up:  Your physician wants you to follow-up in: 9 months with Dr Knox Saliva will receive a reminder letter in the mail two months in advance. If you don't receive a letter, please call our office to schedule the follow-up appointment.   Remote monitoring is used to monitor your Pacemaker of ICD from home. This monitoring reduces the number of office visits required to check your device to one time per year. It allows [place] to keep an eye on the functioning of your device to ensure it is working properly. You are scheduled for a device check from home on 01/22/15. You may send your transmission at any time that day. If you have a wireless device, the transmission will be sent automatically. After your physician reviews your transmission, you will receive a postcard with your next transmission date.    Any Other Special Instructions Will Be Listed Below (If Applicable).

## 2014-10-20 NOTE — Assessment & Plan Note (Signed)
He is still not optimized. His blood pressure is elevated and I have asked him to uptitrate his beta blocker. He is encouraged to reduce his sodium intake.

## 2014-10-20 NOTE — Progress Notes (Signed)
HPI [name] returns today for ongoing evaluation of chronic systolic heart failure in the setting of LBBB, s/p insertion of a St. Jude Biv ICD. He notes only a minimal improvement in his CHF symptoms. No syncope. He admits to sodium indiscretion.l No Known Allergies   Current Outpatient Prescriptions  Medication Sig Dispense Refill  . amLODipine-olmesartan (AZOR) 10-40 MG per tablet Take 1 tablet by mouth daily. 90 tablet 3  . amoxicillin-clavulanate (AUGMENTIN) 875-125 MG per tablet Take 1 tablet by mouth 2 (two) times daily. 14 tablet 0  . aspirin 81 MG EC tablet Take 81 mg by mouth daily.      Marland Kitchen atorvastatin (LIPITOR) 40 MG tablet Take 1 tablet (40 mg total) by mouth daily. 90 tablet 3  . carvedilol (COREG) 3.125 MG tablet TAKE 1/2 TABLET BY MOUTH EVERY MORNING AND TAKE 1/2 TABLET EVERY EVENING 30 tablet 0  . DULERA 100-5 MCG/ACT AERO INHALE 2 PUFFS BY MOUTH VIA SPACER, THEN RINSE MOUTH TWICE DAILY 39 g 0  . folic acid (FOLVITE) 182 MCG tablet Take 400 mcg by mouth every evening.      . Multiple Vitamins-Minerals (MULTIVITAMIN,TX-MINERALS) tablet Take 1 tablet by mouth daily.      [name] Place 1 drop into both eyes daily.     . promethazine-codeine (PHENERGAN WITH CODEINE) 6.25-10 MG/5ML syrup Take 5 mLs by mouth every 6 (six) hours as needed for cough. 240 mL 0  . theophylline (THEODUR) 200 MG 12 hr tablet Take 200 mg by mouth 2 (two) times daily.    . vitamin E 400 UNIT capsule Take 400 Units by mouth daily.       No current facility-administered medications for this visit.     Past Medical History  Diagnosis Date  . Asthma   . COPD (chronic obstructive pulmonary disease)     FEV1 2.68/72%, FEV1/FVC 0.63 (08/22/04  . Colon polyps   . Hyperlipidemia   . HTN (hypertension)   . LBBB (left bundle branch block)     [name] 06/20/2014  . Prostate cancer 2010  . Arthritis        . Non-ischemic cardiomyopathy     a. s/p STJ CRTD  07/2014  . Chronic systolic heart failure     ROS:   All systems reviewed and negative except as noted in the HPI.   Past Surgical History  Procedure Laterality Date  . Robot assisted laparoscopic radical prostatectomy  2010  . Cataract extraction w/ intraocular lens  implant, bilateral Bilateral 2010  . Left heart catheterization with coronary angiogram N/A 01/04/2014    no obstructive CAD  . Bi-ventricular implantable cardioverter defibrillator N/A 07/17/2014    STJ CRTD implanted by Dr Lovena Le     Family History  Problem Relation Age of Onset  . Arthritis Other   . Esophagitis Mother     STRICTURE  . Colon cancer Father 46  . Prostate cancer Father   . Cancer Father     prostate  . Heart attack Neg Hx   . Stroke Mother   . Hypertension Mother   . Hypertension Father      History   Social History  . Marital Status: Married    Spouse Name: N/A  . Number of Children: N/A  . Years of Education: N/A   Occupational History  . Not on file.   Social History Main Topics  . Smoking status: Former Smoker -- 1.50 packs/day for 40 years  Types: Cigarettes    Quit date: 05/05/2002  . Smokeless tobacco: Never Used  . Alcohol Use: No  . Drug Use: No  . Sexual Activity: Not Currently   Other Topics Concern  . Not on file   Social History Narrative   Regular Exercise -  NO           BP 144/72 mmHg  Pulse 78  Ht '6\' 2"'$  (1.88 m)  Wt 232 lb 12.8 oz (105.597 kg)  BMI 29.88 kg/m2  Physical Exam:  Well appearing 80 yo man, NAD HEENT: Unremarkable Neck:  7 cm JVD, no thyromegally Back:  No CVA tenderness Lungs:  Clear with no wheezes, rales or rhonchi HEART:  Regular rate rhythm, no murmurs, no rubs, no clicks, distant heart sounds Abd:  soft, positive bowel sounds, no organomegally, no rebound, no guarding Ext:  2 plus pulses, trace peripheral edema, no cyanosis, no clubbing Skin:  No rashes no nodules Neuro:  CN II through XII intact, motor grossly  intact  ECG - nsr with biv pacing  Assess/Plan:

## 2014-11-18 ENCOUNTER — Other Ambulatory Visit: Payer: Self-pay | Admitting: Internal Medicine

## 2014-11-20 ENCOUNTER — Other Ambulatory Visit: Payer: Self-pay

## 2014-11-20 DIAGNOSIS — I5022 Chronic systolic (congestive) heart failure: Secondary | ICD-10-CM

## 2014-11-20 DIAGNOSIS — I10 Essential (primary) hypertension: Secondary | ICD-10-CM

## 2014-11-20 MED ORDER — CARVEDILOL 3.125 MG PO TABS: 3.1250 mg | ORAL_TABLET | Freq: Two times a day (BID) | ORAL | Status: DC

## 2015-01-19 ENCOUNTER — Ambulatory Visit (INDEPENDENT_AMBULATORY_CARE_PROVIDER_SITE_OTHER): Payer: Commercial Managed Care - HMO | Admitting: *Deleted

## 2015-01-19 DIAGNOSIS — I255 Ischemic cardiomyopathy: Secondary | ICD-10-CM | POA: Diagnosis not present

## 2015-01-20 ENCOUNTER — Encounter: Payer: Self-pay | Admitting: Internal Medicine

## 2015-01-22 ENCOUNTER — Telehealth: Payer: Self-pay | Admitting: Cardiology

## 2015-01-22 ENCOUNTER — Encounter: Payer: Commercial Managed Care - HMO | Admitting: *Deleted

## 2015-01-22 NOTE — Telephone Encounter (Signed)
Confirmed remote transmission w/ pt wife.   

## 2015-01-24 ENCOUNTER — Encounter: Payer: Self-pay | Admitting: Cardiology

## 2015-01-31 ENCOUNTER — Other Ambulatory Visit: Payer: Self-pay | Admitting: Internal Medicine

## 2015-02-07 NOTE — Progress Notes (Signed)
Remote ICD transmission.   

## 2015-02-09 ENCOUNTER — Encounter: Payer: Self-pay | Admitting: Internal Medicine

## 2015-02-09 ENCOUNTER — Ambulatory Visit (INDEPENDENT_AMBULATORY_CARE_PROVIDER_SITE_OTHER): Payer: Commercial Managed Care - HMO | Admitting: Internal Medicine

## 2015-02-09 VITALS — BP 132/78 | HR 82 | Ht 74.0 in | Wt 223.0 lb

## 2015-02-09 DIAGNOSIS — J449 Chronic obstructive pulmonary disease, unspecified: Secondary | ICD-10-CM | POA: Diagnosis not present

## 2015-02-09 DIAGNOSIS — Z23 Encounter for immunization: Secondary | ICD-10-CM | POA: Diagnosis not present

## 2015-02-09 LAB — CUP PACEART REMOTE DEVICE CHECK
Battery Remaining Longevity: 67 mo
Battery Voltage: 3.05 V
Brady Statistic AP VP Percent: 33 %
Brady Statistic AP VS Percent: 6.5 %
Brady Statistic AS VS Percent: 7.7 %
Date Time Interrogation Session: 20160917060024
HighPow Impedance: 56 Ohm
HighPow Impedance: 56 Ohm
Lead Channel Impedance Value: 350 Ohm
Lead Channel Impedance Value: 360 Ohm
Lead Channel Pacing Threshold Amplitude: 1 V
Lead Channel Pacing Threshold Pulse Width: 0.5 ms
Lead Channel Pacing Threshold Pulse Width: 0.5 ms
Lead Channel Pacing Threshold Pulse Width: 0.6 ms
Lead Channel Sensing Intrinsic Amplitude: 12 mV
Lead Channel Setting Pacing Amplitude: 2 V
Lead Channel Setting Pacing Pulse Width: 0.5 ms
Lead Channel Setting Sensing Sensitivity: 0.5 mV
MDC IDC MSMT BATTERY REMAINING PERCENTAGE: 86 %
MDC IDC MSMT LEADCHNL RA PACING THRESHOLD AMPLITUDE: 0.75 V
MDC IDC MSMT LEADCHNL RA SENSING INTR AMPL: 4.3 mV
MDC IDC MSMT LEADCHNL RV IMPEDANCE VALUE: 400 Ohm
MDC IDC MSMT LEADCHNL RV PACING THRESHOLD AMPLITUDE: 0.5 V
MDC IDC PG SERIAL: 7239374
MDC IDC SET LEADCHNL LV PACING PULSEWIDTH: 0.6 ms
MDC IDC SET LEADCHNL RA PACING AMPLITUDE: 2 V
MDC IDC SET LEADCHNL RV PACING AMPLITUDE: 2 V
MDC IDC SET ZONE DETECTION INTERVAL: 250 ms
MDC IDC STAT BRADY AS VP PERCENT: 48 %
MDC IDC STAT BRADY RA PERCENT PACED: 32 %
Zone Setting Detection Interval: 300 ms

## 2015-02-09 NOTE — Patient Instructions (Addendum)
Flu vax  Please call as we can help

## 2015-02-09 NOTE — Progress Notes (Signed)
11/18/12- 20 former smoker  yoM re-establishing for COPD                            CP Dr Darene Lamer. Ronnald Ramp Has been using Anoro, Daliresp. FOLLOWS FOR: throat congestion, wheezing at night, occ prod cough with clear mucus x3days Main complaint is increased throat clearing for 6 weeks. Dr. Ronnald Ramp gave Daliresp, helpful but too expensive. Dulera changed to lAnoro, started shortly before throat became hoarse. Denies dysphagia or throat pain. Occasional sneeze. CXR 06/13/11 IMPRESSION:  Stable chest x-ray with slight hyperaeration. No active lung  disease.  Original Report Authenticated By: Joretta Bachelor, M.D.  01/04/13- 57 former smoker  yoM re-establishing for COPD                            PCP Dr Darene Lamer. Ronnald Ramp FOLLOWS FOR: has been using Dulera in the mornings for the most part(trying to get the second dose in at night); denies any wheezing, cough, congestion, or SOB today. Does not want flu vaccine "made him sick". Feels well controlled now with Dulera once daily. Medications reviewed. Daliresp was too expensive.  08/08/13- 19 former smoker  yoM re-establishing for COPD                            PCP Dr Darene Lamer. Ronnald Ramp ACUTE VISIT: continues to have "tickle" cough/congestion in back of throat. When lying down at night has to clear throat alot. Upper airway rattle/tickle cough lying down. Some dyspnea on exertion. Daliresp was too expensive. Using [facility]. CXR 01/05/13 IMPRESSION:  Underlying emphysema. No edema or consolidation.  Original Report Authenticated By: Lowella Grip, M.D.  02/07/14- 66 yoM  former smoker followed for COPD, complicated by DM2, CAD/CM, anemia/ FE          PCP Dr Darene Lamer. Ronnald Ramp FOLLOWS FOR: Pt states he is getting short winded all the time(walking up hills causes the most trouble). Had Myocardial imaging , then Echo showing EF 25-30% with akinesis, moderate Pulmonary hypertension, Grade 1 dCHF, LBBB Increased dyspnea on exertion especially hills and stairs. Occasional wheeze but not cough. Using  [facility] once or twice daily. Anemia Hgb 10.8 on BASA. Had colonoscopy Often forgets theophylline at breakfast time when he skips breakfast. Office Spirometry 02/07/14- moderate airway obstruction. FVC 3.45/84%, FEV1 1.86/60%, FEV1/FVC 0.54, FEF 25-75% 0.67/22%  08/10/14- 78 yoM  former smoker followed for COPD, complicated by DM2, CAD/CM, anemia/ FE          PCP Dr Alona Bene FOLLOWS FOR: PND with cough-clear in color in am. Noticing less energy and easier dyspnea on exertion with no significant acute event. He recognizes contribution from heart and from lungs. Minor occasional cough. Little wheeze. CXR 07/27/14-  IMPRESSION: No change in position of the AICD and pacer leads when compared to prior chest x-ray. No active cardiopulmonary disease. Electronically Signed  By: Ivar Drape M.D.  On: 07/27/2014 14:17  09/18/14- - 68 yoM  former smoker followed for COPD, complicated by DM2, CAD/CM/ AICD/ pacer, anemia/ FE          PCP Dr Darene Lamer. Ronnald Ramp Follows For: Pt states breathing is doing fine. He c/o of a prod cough with yellow thick mucus, he says cough worsens at bedtime and early mornings. SOB with exertion. At night he notices postnasal drip, sleeping on 2 pillows. Some cough. Denies fever, headache or sore  throat. His cough at night bothers his wife. CXR 07/27/14- IMPRESSION: No change in position of the AICD and pacer leads when compared to prior chest x-ray. No active cardiopulmonary disease. Electronically Signed  By: Ivar Drape M.D.  On: 07/27/2014 14:17  02/09/15-  72 yoM  former smoker followed for COPD, complicated by DM2, CAD/CM/ AICD/ pacer, anemia/ FE  PCP Dr Darene Lamer. Ronnald Ramp FOLLOWS FOR:pt. states he has occ. SOB. no wheezing. denies chest pain or tightness. no cough or congestion. Cough has been much better since last visit. We reviewed his vaccination history. He has had enough pneumococcal vaccine.  ROS-see HPI Constitutional:   No-   weight loss, night sweats, fevers, chills, fatigue,  lassitude. HEENT:   No-  headaches, difficulty swallowing, tooth/dental problems, sore throat,       No-  sneezing, itching, ear ache, +nasal congestion, post nasal drip,  CV:  No-   chest pain, orthopnea, PND, swelling in lower extremities, anasarca, dizziness, palpitations Resp: + shortness of breath with exertion or at rest.              productive cough,  + non-productive cough,  No- coughing up of blood.                change in color of mucus.  No- wheezing.   Skin: No-   rash or lesions. GI:  No-   heartburn, indigestion, abdominal pain, nausea, vomiting, GU:  MS:  No-   joint pain or swelling.   Neuro-     nothing unusual Psych:  No- change in mood or affect. No depression or anxiety.  No memory loss.  OBJ- Physical Exam General- Alert, Oriented, Affect-appropriate, Distress- none acute Skin- rash-none, lesions- none, excoriation- none Lymphadenopathy- none Head- atraumatic            Eyes- Gross vision intact, PERRLA, conjunctivae and secretions clear            Ears- Hearing, canals-normal            Nose- Clear, no-Septal dev, mucus, polyps, erosion, perforation             Throat- Mallampati III , mucosa clear , drainage -none, tonsils- atrophic Neck- flexible , trachea midline, no stridor , thyroid nl, carotid no bruit Chest - symmetrical excursion , unlabored           Heart/CV- RRR , no murmur , no gallop  , no rub, nl s1 s2                           - JVD- none , edema- none, stasis changes- none, varices- none           Lung- clear, wheeze- none, cough -none , dullness-none, rub- none           Chest wall-  L  AICD Abd-  Br/ Gen/ Rectal- Not done, not indicated Extrem- cyanosis- none, clubbing, none, atrophy- none, strength- nl Neuro- grossly intact to observation

## 2015-02-09 NOTE — Assessment & Plan Note (Signed)
Acute exacerbation was resolved last visit and he is now at baseline. Plan-flu vaccine

## 2015-02-14 ENCOUNTER — Encounter: Payer: Self-pay | Admitting: Cardiology

## 2015-03-23 ENCOUNTER — Other Ambulatory Visit: Payer: Self-pay

## 2015-03-23 MED ORDER — AMLODIPINE-OLMESARTAN 10-40 MG PO TABS: 1.0000 | ORAL_TABLET | Freq: Every day | ORAL | Status: DC

## 2015-04-01 ENCOUNTER — Other Ambulatory Visit: Payer: Self-pay | Admitting: Internal Medicine

## 2015-04-23 ENCOUNTER — Ambulatory Visit (INDEPENDENT_AMBULATORY_CARE_PROVIDER_SITE_OTHER): Payer: Commercial Managed Care - HMO | Admitting: *Deleted

## 2015-04-23 DIAGNOSIS — I255 Ischemic cardiomyopathy: Secondary | ICD-10-CM | POA: Diagnosis not present

## 2015-04-23 NOTE — Progress Notes (Signed)
Remote ICD transmission.   

## 2015-04-26 ENCOUNTER — Encounter: Payer: Self-pay | Admitting: Cardiology

## 2015-04-26 LAB — CUP PACEART REMOTE DEVICE CHECK
Battery Remaining Longevity: 60 mo
Battery Voltage: 3.02 V
Brady Statistic AP VP Percent: 31 %
Brady Statistic AS VP Percent: 45 %
Brady Statistic RA Percent Paced: 30 %
HighPow Impedance: 48 Ohm
HighPow Impedance: 48 Ohm
Implantable Lead Implant Date: 20160314
Implantable Lead Implant Date: 20160314
Implantable Lead Implant Date: 20160314
Implantable Lead Location: 753860
Implantable Lead Model: 7122
Lead Channel Impedance Value: 350 Ohm
Lead Channel Pacing Threshold Amplitude: 1.5 V
Lead Channel Pacing Threshold Amplitude: 2.125 V
Lead Channel Pacing Threshold Pulse Width: 0.6 ms
Lead Channel Setting Pacing Amplitude: 2 V
Lead Channel Setting Pacing Amplitude: 2.5 V
Lead Channel Setting Pacing Amplitude: 3.125
Lead Channel Setting Pacing Pulse Width: 0.5 ms
Lead Channel Setting Pacing Pulse Width: 0.6 ms
Lead Channel Setting Sensing Sensitivity: 0.5 mV
MDC IDC LEAD LOCATION: 753858
MDC IDC LEAD LOCATION: 753859
MDC IDC MSMT BATTERY REMAINING PERCENTAGE: 83 %
MDC IDC MSMT LEADCHNL LV IMPEDANCE VALUE: 310 Ohm
MDC IDC MSMT LEADCHNL RA SENSING INTR AMPL: 3.2 mV
MDC IDC MSMT LEADCHNL RV IMPEDANCE VALUE: 380 Ohm
MDC IDC MSMT LEADCHNL RV PACING THRESHOLD PULSEWIDTH: 0.5 ms
MDC IDC MSMT LEADCHNL RV SENSING INTR AMPL: 12 mV
MDC IDC SESS DTM: 20161216070017
MDC IDC STAT BRADY AP VS PERCENT: 7.6 %
MDC IDC STAT BRADY AS VS PERCENT: 8.8 %
Pulse Gen Serial Number: 7239374

## 2015-05-23 ENCOUNTER — Other Ambulatory Visit: Payer: Self-pay | Admitting: Internal Medicine

## 2015-06-04 ENCOUNTER — Telehealth: Payer: Self-pay

## 2015-06-04 ENCOUNTER — Other Ambulatory Visit (INDEPENDENT_AMBULATORY_CARE_PROVIDER_SITE_OTHER): Payer: Commercial Managed Care - HMO

## 2015-06-04 ENCOUNTER — Encounter: Payer: Self-pay | Admitting: Internal Medicine

## 2015-06-04 ENCOUNTER — Ambulatory Visit (INDEPENDENT_AMBULATORY_CARE_PROVIDER_SITE_OTHER): Payer: Commercial Managed Care - HMO | Admitting: Internal Medicine

## 2015-06-04 VITALS — BP 140/72 | HR 90 | Temp 97.9°F | Resp 16 | Ht 74.0 in | Wt 220.0 lb

## 2015-06-04 DIAGNOSIS — Z794 Long term (current) use of insulin: Secondary | ICD-10-CM

## 2015-06-04 DIAGNOSIS — D510 Vitamin B12 deficiency anemia due to intrinsic factor deficiency: Secondary | ICD-10-CM

## 2015-06-04 DIAGNOSIS — E118 Type 2 diabetes mellitus with unspecified complications: Secondary | ICD-10-CM

## 2015-06-04 DIAGNOSIS — D509 Iron deficiency anemia, unspecified: Secondary | ICD-10-CM | POA: Insufficient documentation

## 2015-06-04 DIAGNOSIS — E785 Hyperlipidemia, unspecified: Secondary | ICD-10-CM

## 2015-06-04 DIAGNOSIS — I493 Ventricular premature depolarization: Secondary | ICD-10-CM | POA: Diagnosis not present

## 2015-06-04 DIAGNOSIS — I5022 Chronic systolic (congestive) heart failure: Secondary | ICD-10-CM | POA: Diagnosis not present

## 2015-06-04 DIAGNOSIS — R0609 Other forms of dyspnea: Secondary | ICD-10-CM

## 2015-06-04 DIAGNOSIS — Z0000 Encounter for general adult medical examination without abnormal findings: Secondary | ICD-10-CM | POA: Diagnosis not present

## 2015-06-04 DIAGNOSIS — I10 Essential (primary) hypertension: Secondary | ICD-10-CM

## 2015-06-04 LAB — COMPREHENSIVE METABOLIC PANEL
ALBUMIN: 3.6 g/dL (ref 3.5–5.2)
ALK PHOS: 103 U/L (ref 39–117)
ALT: 9 U/L (ref 0–53)
AST: 18 U/L (ref 0–37)
BILIRUBIN TOTAL: 0.5 mg/dL (ref 0.2–1.2)
BUN: 15 mg/dL (ref 6–23)
CHLORIDE: 107 meq/L (ref 96–112)
CO2: 25 mEq/L (ref 19–32)
CREATININE: 1.01 mg/dL (ref 0.40–1.50)
Calcium: 8.9 mg/dL (ref 8.4–10.5)
GFR: 91.84 mL/min (ref >60.00)
Glucose, Bld: 101 mg/dL — ABNORMAL HIGH (ref 70–99)
Potassium: 4.1 mEq/L (ref 3.5–5.1)
SODIUM: 140 meq/L (ref 135–145)
TOTAL PROTEIN: 6.9 g/dL (ref 6.0–8.3)

## 2015-06-04 LAB — URINALYSIS, ROUTINE W REFLEX MICROSCOPIC
Bilirubin Urine: NEGATIVE
Hgb urine dipstick: NEGATIVE
KETONES UR: NEGATIVE
Leukocytes, UA: NEGATIVE
NITRITE: NEGATIVE
PH: 6 (ref 5.0–8.0)
RBC / HPF: NONE SEEN (ref 0–?)
SPECIFIC GRAVITY, URINE: 1.02 (ref 1.000–1.030)
Total Protein, Urine: NEGATIVE
URINE GLUCOSE: NEGATIVE
Urobilinogen, UA: 0.2 (ref 0.0–1.0)
WBC, UA: NONE SEEN (ref 0–?)

## 2015-06-04 LAB — MICROALBUMIN / CREATININE URINE RATIO
Creatinine,U: 134.5 mg/dL
MICROALB/CREAT RATIO: 1.3 mg/g (ref 0.0–30.0)
Microalb, Ur: 1.7 mg/dL (ref 0.0–1.9)

## 2015-06-04 LAB — CBC WITH DIFFERENTIAL/PLATELET
BASOS PCT: 1 % (ref 0.0–3.0)
Eosinophils Relative: 3 % (ref 0.0–5.0)
HCT: 23.5 % — CL (ref 39.0–52.0)
Hemoglobin: 6.5 g/dL — CL (ref 13.0–17.0)
Lymphocytes Relative: 16 % (ref 12.0–46.0)
MCHC: 27.7 g/dL — ABNORMAL LOW (ref 30.0–36.0)
Monocytes Relative: 14 % — ABNORMAL HIGH (ref 3.0–12.0)
NEUTROS PCT: 66 % (ref 43.0–77.0)
Platelets: 417 10*3/uL — ABNORMAL HIGH (ref 150.0–400.0)
RBC: 4.42 Mil/uL (ref 4.22–5.81)
RDW: 23.2 % — ABNORMAL HIGH (ref 11.5–15.5)
WBC: 5.1 10*3/uL (ref 4.0–10.5)

## 2015-06-04 LAB — IBC PANEL
Iron: 17 ug/dL — ABNORMAL LOW (ref 42–165)
SATURATION RATIOS: 4.3 % — AB (ref 20.0–50.0)
Transferrin: 285 mg/dL (ref 212.0–360.0)

## 2015-06-04 LAB — TSH: TSH: 2.58 u[IU]/mL (ref 0.35–4.50)

## 2015-06-04 LAB — LIPID PANEL
CHOL/HDL RATIO: 3
Cholesterol: 99 mg/dL (ref 0–200)
HDL: 36.5 mg/dL — ABNORMAL LOW (ref >39.00)
LDL Cholesterol: 50 mg/dL (ref 0–99)
NONHDL: 62.57
TRIGLYCERIDES: 63 mg/dL (ref 0.0–149.0)
VLDL: 12.6 mg/dL (ref 0.0–40.0)

## 2015-06-04 LAB — HEMOGLOBIN A1C: Hgb A1c MFr Bld: 6.4 % (ref 4.6–6.5)

## 2015-06-04 LAB — VITAMIN B12: VITAMIN B 12: 448 pg/mL (ref 211–911)

## 2015-06-04 LAB — D-DIMER, QUANTITATIVE: D-Dimer, Quant: 0.39 ug/mL-FEU (ref 0.00–0.48)

## 2015-06-04 LAB — FOLATE

## 2015-06-04 LAB — TROPONIN I: TNIDX: 0.02 ug/L (ref 0.00–0.06)

## 2015-06-04 LAB — FERRITIN: FERRITIN: 7.9 ng/mL — AB (ref 22.0–322.0)

## 2015-06-04 LAB — BRAIN NATRIURETIC PEPTIDE: Pro B Natriuretic peptide (BNP): 765 pg/mL — ABNORMAL HIGH (ref 0.0–100.0)

## 2015-06-04 MED ORDER — VALSARTAN 320 MG PO TABS: 320.0000 mg | ORAL_TABLET | Freq: Every day | ORAL | Status: DC

## 2015-06-04 MED ORDER — FERRALET 90 90-1 MG PO TABS: 1.0000 | ORAL_TABLET | Freq: Every day | ORAL | Status: DC

## 2015-06-04 MED ORDER — AMLODIPINE BESYLATE 10 MG PO TABS: 10.0000 mg | ORAL_TABLET | Freq: Every day | ORAL | Status: DC

## 2015-06-04 NOTE — Telephone Encounter (Signed)
CRITICAL LAB received from [facility].  hgb 6.5 hematocrit 23.5

## 2015-06-04 NOTE — Progress Notes (Signed)
Pre visit review using our clinic review tool, if applicable. No additional management support is needed unless otherwise documented below in the visit note. 

## 2015-06-04 NOTE — Progress Notes (Signed)
Subjective:  Patient ID: KARMA HINEY, male    DOB: 11/11/1934  Age: 81 y.o. MRN: [id number]  CC: Anemia; Hypertension; Hyperlipidemia; Diabetes; and Annual Exam   HPI YOBANI SCHERTZER presents for a CPX and f/up. He complains of worsening DOE over the last year.  Outpatient Prescriptions Prior to Visit  Medication Sig Dispense Refill  . aspirin 81 MG EC tablet Take 81 mg by mouth daily.      Marland Kitchen atorvastatin (LIPITOR) 40 MG tablet Take 1 tablet (40 mg total) by mouth daily. 90 tablet 3  . carvedilol (COREG) 3.125 MG tablet TAKE 1 TABLET(3.125 MG) BY MOUTH TWICE DAILY WITH A MEAL 60 tablet 3  . DULERA 100-5 MCG/ACT AERO INHALE 2 PUFFS VIA SPACER TWICE DAILY( THEN RINSE MOUTH) 39 g 1  . folic acid (FOLVITE) 440 MCG tablet Take 400 mcg by mouth every evening.      . Multiple Vitamins-Minerals (MULTIVITAMIN,TX-MINERALS) tablet Take 1 tablet by mouth daily.      [name] Place 1 drop into both eyes daily.     . vitamin E 400 UNIT capsule Take 400 Units by mouth daily.      Marland Kitchen amLODipine-olmesartan (AZOR) 10-40 MG tablet Take 1 tablet by mouth daily. 90 tablet 3  . amoxicillin-clavulanate (AUGMENTIN) 875-125 MG per tablet Take 1 tablet by mouth 2 (two) times daily. 14 tablet 0  . promethazine-codeine (PHENERGAN WITH CODEINE) 6.25-10 MG/5ML syrup Take 5 mLs by mouth every 6 (six) hours as needed for cough. 240 mL 0  . theophylline (THEODUR) 200 MG 12 hr tablet Take 200 mg by mouth 2 (two) times daily.     No facility-administered medications prior to visit.    ROS Review of Systems  Constitutional: Positive for fatigue. Negative for fever, chills, diaphoresis, activity change and appetite change.  HENT: Negative.  Negative for facial swelling, nosebleeds, sinus pressure, sore throat and trouble swallowing.   Eyes: Negative.   Respiratory: Positive for shortness of breath. Negative for apnea, cough, choking, chest tightness, wheezing and stridor.     Cardiovascular: Negative.  Negative for chest pain, palpitations and leg swelling.  Gastrointestinal: Negative.  Negative for nausea, vomiting, abdominal pain, diarrhea, constipation and blood in stool.  Endocrine: Negative.  Negative for cold intolerance and heat intolerance.  Genitourinary: Negative.   Musculoskeletal: Negative.  Negative for myalgias, back pain, joint swelling and arthralgias.  Skin: Positive for pallor. Negative for color change and rash.  Allergic/Immunologic: Negative.   Neurological: Negative.  Negative for dizziness, tremors, weakness, light-headedness, numbness and headaches.  Hematological: Negative.  Negative for adenopathy. Does not bruise/bleed easily.  Psychiatric/Behavioral: Negative.     Objective:  BP 140/72 mmHg  Pulse 90  Temp(Src) 97.9 F (36.6 C) (Oral)  Resp 16  Ht '6\' 2"'$  (1.88 m)  Wt 220 lb (99.791 kg)  BMI 28.23 kg/m2  SpO2 94%  BP Readings from Last 3 Encounters:  06/04/15 140/72  02/09/15 132/78  10/20/14 144/72    Wt Readings from Last 3 Encounters:  06/04/15 220 lb (99.791 kg)  02/09/15 223 lb (101.152 kg)  10/20/14 232 lb 12.8 oz (105.597 kg)    Physical Exam  Constitutional: He is oriented to person, place, and time. He appears well-developed and well-nourished. No distress.  HENT:  Head: Normocephalic and atraumatic.  Mouth/Throat: Oropharynx is clear and moist. No oropharyngeal exudate.  Eyes: Conjunctivae are normal. Right eye exhibits no discharge. Left eye exhibits no discharge. No scleral icterus.  Neck: Normal range of motion. Neck supple. No JVD present. No tracheal deviation present. No thyromegaly present.  Cardiovascular: Normal rate, regular rhythm, S1 normal, S2 normal and intact distal pulses.  Exam reveals no gallop, no S3, no S4 and no friction rub.   Murmur heard.  Decrescendo systolic murmur is present with a grade of 1/6   No diastolic murmur is present  Pulses:      Carotid pulses are 1+ on the right  side, and 1+ on the left side.      Radial pulses are 1+ on the right side, and 1+ on the left side.       Femoral pulses are 1+ on the right side, and 1+ on the left side.      Popliteal pulses are 1+ on the right side, and 1+ on the left side.       Dorsalis pedis pulses are 1+ on the right side, and 1+ on the left side.       Posterior tibial pulses are 1+ on the right side, and 1+ on the left side.  1/6 SEM over LLSB  EKG-  Sinus  Rhythm  -First degree A-V block  -With run of ventricular ectopic beats  PRi = 236 -Nonspecific ST depression   +   T-abnormality.   ABNORMAL    Pulmonary/Chest: Effort normal and breath sounds normal. No stridor. No respiratory distress. He has no wheezes. He has no rales. He exhibits no tenderness.  Abdominal: Soft. Bowel sounds are normal. He exhibits no distension and no mass. There is no tenderness. There is no rebound and no guarding. Hernia confirmed negative in the right inguinal area and confirmed negative in the left inguinal area.  Genitourinary: Rectum normal, prostate normal, testes normal and penis normal. Rectal exam shows no external hemorrhoid, no internal hemorrhoid, no fissure, no mass, no tenderness and anal tone normal. Guaiac negative stool. Prostate is not enlarged and not tender. Right testis shows no mass, no swelling and no tenderness. Right testis is descended. Left testis shows no mass, no swelling and no tenderness. Left testis is descended. Uncircumcised. No phimosis, paraphimosis, hypospadias, penile erythema or penile tenderness. No discharge found.  Musculoskeletal: Normal range of motion. He exhibits edema (trace pitting edema in BLE). He exhibits no tenderness.  Lymphadenopathy:    He has no cervical adenopathy.       Right: No inguinal adenopathy present.       Left: No inguinal adenopathy present.  Neurological: He is oriented to person, place, and time.  Skin: Skin is warm and dry. No rash noted. He is not diaphoretic. No  erythema. No pallor.  Psychiatric: He has a normal mood and affect. His behavior is normal. Judgment and thought content normal.  Vitals reviewed.   Lab Results  Component Value Date   WBC 5.1 06/04/2015   HGB 6.5 cL* 06/04/2015   HCT 23.5 aL* 06/04/2015   PLT 417.0 Giant Platelets seen on smear.* 06/04/2015   GLUCOSE 101* 06/04/2015   CHOL 99 06/04/2015   TRIG 63.0 06/04/2015   HDL 36.50* 06/04/2015   LDLCALC 50 06/04/2015   ALT 9 06/04/2015   AST 18 06/04/2015   NA 140 06/04/2015   K 4.1 06/04/2015   CL 107 06/04/2015   CREATININE 1.01 06/04/2015   BUN 15 06/04/2015   CO2 25 06/04/2015   TSH 2.58 06/04/2015   PSA 0.00* 05/31/2012   INR 1.0 01/02/2014   HGBA1C 6.4 06/04/2015   MICROALBUR 1.7  06/04/2015    Dg Chest 2 View  07/27/2014  CLINICAL DATA:  Shortness of breath, evaluate lead placement EXAM: CHEST  2 VIEW COMPARISON:  Chest x-ray of 07/18/2014 FINDINGS: No active infiltrate or effusion is seen. Mediastinal and hilar contours are unremarkable. There is some peribronchial thickening which may indicate bronchitis. The heart is within normal limits in size. A pacer lead appears to coil in the right atrium with an AICD lead extending into the right ventricle near the apex. An additional wire extends posteriorly into the region of the coronary sinus. These leads are unchanged in position compared to the prior chest x-ray. There are mild degenerative changes in the thoracic spine. IMPRESSION: No change in position of the AICD and pacer leads when compared to prior chest x-ray. No active cardiopulmonary disease. Electronically Signed   By: Ivar Drape M.D.   On: 07/27/2014 14:17    Assessment & Plan:   Carman was seen today for anemia, hypertension, hyperlipidemia, diabetes and annual exam.  Diagnoses and all orders for this visit:  Essential hypertension- his BP is well controlled, lytes and renal function are stable -     Comprehensive metabolic panel; Future -      Urinalysis, Routine w reflex microscopic (not at [facility]); Future -     valsartan (DIOVAN) 320 MG tablet; Take 1 tablet (320 mg total) by mouth daily. -     amLODipine (NORVASC) 10 MG tablet; Take 1 tablet (10 mg total) by mouth daily.  Type 2 diabetes mellitus with complication, with long-term current use of insulin ([place])- his blood sugars are well controlled -     Lipid panel; Future -     Hemoglobin A1c; Future -     Microalbumin / creatinine urine ratio; Future -     valsartan (DIOVAN) 320 MG tablet; Take 1 tablet (320 mg total) by mouth daily.  Hyperlipidemia with target LDL less than 100- he has achieved his LDL goal -     Lipid panel; Future -     TSH; Future  Pernicious anemia- H and H are down, iron and ferritin are very low, will start iron -     CBC with Differential/Platelet; Future -     IBC panel; Future -     Vitamin B12; Future -     Ferritin; Future -     Folate; Future -     Fe Cbn-Fe Gluc-FA-B12-C-DSS (FERRALET 90) 90-1 MG TABS; Take 1 tablet by mouth daily.  DOE (dyspnea on exertion)- this is multifactorial but mostly related to the anemia, will start treating with an iron replacement therapy -     EKG 12-Lead -     Troponin I; Future -     D-dimer, quantitative (not at [facility]); Future -     Brain natriuretic peptide; Future -     Ambulatory referral to Cardiology  Ventricular ectopy- this is new on the EKG from today, will refer to cardiology, his lytes and normal -     Ambulatory referral to Cardiology  Chronic systolic heart failure ([place])- he has chronic, worsening DOE and a slightly elevated BNP, I have asked him to see cardiology soon -     Ambulatory referral to Cardiology -     valsartan (DIOVAN) 320 MG tablet; Take 1 tablet (320 mg total) by mouth daily. -     amLODipine (NORVASC) 10 MG tablet; Take 1 tablet (10 mg total) by mouth daily.  Iron deficiency anemia- will start iron and refer  to GI to look for sources of blood loss -     Fe Cbn-Fe  Gluc-FA-B12-C-DSS (FERRALET 90) 90-1 MG TABS; Take 1 tablet by mouth daily. -     Ambulatory referral to Gastroenterology   I have discontinued Mr. Wolz amoxicillin-clavulanate, promethazine-codeine, and amLODipine-olmesartan. I am also having him start on valsartan, amLODipine, and FERRALET 90. Additionally, I am having him maintain his aspirin, (multivitamin,tx-minerals), folic acid, vitamin E, Polyethyl Glycol-Propyl Glycol (SYSTANE OP), atorvastatin, DULERA, carvedilol, and theophylline.  Meds ordered this encounter  Medications  . theophylline (UNIPHYL) 400 MG 24 hr tablet    Sig: TK 1/2 T PO BID WF    Refill:  0  . valsartan (DIOVAN) 320 MG tablet    Sig: Take 1 tablet (320 mg total) by mouth daily.    Dispense:  90 tablet    Refill:  3  . amLODipine (NORVASC) 10 MG tablet    Sig: Take 1 tablet (10 mg total) by mouth daily.    Dispense:  90 tablet    Refill:  3  . Fe Cbn-Fe Gluc-FA-B12-C-DSS (FERRALET 90) 90-1 MG TABS    Sig: Take 1 tablet by mouth daily.    Dispense:  30 each    Refill:  11   See AVS for instructions about healthy living and anticipatory guidance.  Follow-up: Return in about 3 months (around 09/02/2015).  Scarlette Calico, MD

## 2015-06-04 NOTE — Assessment & Plan Note (Signed)

## 2015-06-04 NOTE — Patient Instructions (Signed)

## 2015-06-06 ENCOUNTER — Encounter: Payer: Self-pay | Admitting: Physician Assistant

## 2015-06-06 ENCOUNTER — Telehealth: Payer: Self-pay

## 2015-06-06 ENCOUNTER — Other Ambulatory Visit: Payer: Self-pay

## 2015-06-06 ENCOUNTER — Ambulatory Visit (INDEPENDENT_AMBULATORY_CARE_PROVIDER_SITE_OTHER): Payer: Commercial Managed Care - HMO | Admitting: Physician Assistant

## 2015-06-06 VITALS — BP 138/62 | HR 88 | Ht 72.84 in | Wt 222.0 lb

## 2015-06-06 DIAGNOSIS — D509 Iron deficiency anemia, unspecified: Secondary | ICD-10-CM

## 2015-06-06 DIAGNOSIS — I429 Cardiomyopathy, unspecified: Secondary | ICD-10-CM | POA: Diagnosis not present

## 2015-06-06 DIAGNOSIS — I428 Other cardiomyopathies: Secondary | ICD-10-CM

## 2015-06-06 DIAGNOSIS — R5383 Other fatigue: Secondary | ICD-10-CM | POA: Diagnosis not present

## 2015-06-06 NOTE — Progress Notes (Signed)
Patient ID: [name], male   DOB: [birth year], 81 y.o.   MRN: [id number]   Subjective:    Patient ID: [name], male    DOB: [birth year], 81 y.o.   MRN: [id number]  HPI  [name]  Is a very nice 81 year old African-American male previously known to Dr. Delfin Moran from prior colonoscopies who is referred today by Dr. Ronnald Moran for evaluation of profound microcytic anemia.  Patient has history of nonischemic cardiomyopathy with EF of 25-30% , he is status post ICD placement, has history of congestive heart failure left bundle-branch block, COPD, hypertension, and adult-onset diabetes mellitus.  He had physical done within the past Moran with Dr. Ronnald Moran and routine labs were done showing a hemoglobin of 6.5 hematocrit of 23.5, MCV of 53 , BNP elevated at 765, serum iron 17 transferrin 280 5 and iron sat 4.3.  Reviewing records hemoglobin in March 2016 was 9.3 hematocrit of 29.2 MCV of 68 and in August 2015 hemoglobin 10.8 hematocrit of 34 MCV of 77.  patient has no current GI complaints. He says he has been feeling extremely fatigued gets very winded with any sort of exertion. He says he can walk to his mailbox downhill but has a very difficult time on the incline back to the house is very short of breath and has to immediately sit down. He denies any chest pain. Patient has no complaints of abdominal discomfort, no changes in bowel habits no melena or hematochezia. Appetite has been fine he has lost a few pounds but has been trying. No complaints of heartburn indigestion dysphagia. He is not anticoagulated but does take a baby aspirin daily..  Last colonoscopy done in July 2015 for history of polyps. Patient found to have 5 polyps all 5-9 mm size range with complete resections 3 of these were tubular adenomas, 2 hyperplastic. Also noted mild diverticulosis. Add prior EGD  No prior history of documented iron deficiency anemia  He does have cardiology appointment tomorrow morning with Dr. Lovena Moran  Re: Complaints of fatigue and elevated BNP.  Review of Systems Pertinent positive and negative review of systems were noted in the above HPI section.  All other review of systems was otherwise negative.  Outpatient Encounter Prescriptions as of 06/06/2015  Medication Sig  . amLODipine (NORVASC) 10 MG tablet Take 1 tablet (10 mg total) by mouth daily.  Marland Kitchen aspirin 81 MG EC tablet Take 81 mg by mouth daily.    Marland Kitchen atorvastatin (LIPITOR) 40 MG tablet Take 1 tablet (40 mg total) by mouth daily.  . carvedilol (COREG) 3.125 MG tablet TAKE 1 TABLET(3.125 MG) BY MOUTH TWICE DAILY WITH A MEAL  . DULERA 100-5 MCG/ACT AERO INHALE 2 PUFFS VIA SPACER TWICE DAILY( THEN RINSE MOUTH)  . Fe Cbn-Fe Gluc-FA-B12-C-DSS (FERRALET 90) 90-1 MG TABS Take 1 tablet by mouth daily.  . folic acid (FOLVITE) 951 MCG tablet Take 400 mcg by mouth every evening.    . Multiple Vitamins-Minerals (MULTIVITAMIN,TX-MINERALS) tablet Take 1 tablet by mouth daily.    [name] Glycol-Propyl Glycol (SYSTANE OP) Place 1 drop into both eyes daily.   . theophylline (UNIPHYL) 400 MG 24 hr tablet TK 1/2 T PO BID WF  . valsartan (DIOVAN) 320 MG tablet Take 1 tablet (320 mg total) by mouth daily.  . vitamin E 400 UNIT capsule Take 400 Units by mouth daily.     No facility-administered encounter medications on file as of 06/06/2015.   No Known Allergies Patient Active Problem List  Diagnosis Date Noted  . Non-ischemic cardiomyopathy ([place]) 06/06/2015  . Ventricular ectopy 06/04/2015  . DOE (dyspnea on exertion) 06/04/2015  . Iron deficiency anemia 06/04/2015  . Seasonal and perennial allergic rhinitis 08/13/2014  . S/P ICD (internal cardiac defibrillator) procedure 07/17/2014  . Chronic systolic heart failure ([place]) 06/20/2014  . NEOPLASM, MALIGNANT, PROSTATE, HX OF, S/P TURP 05/31/2012  . Routine general medical examination at a health care facility 05/31/2012  . Pernicious anemia 04/15/2011  . Cervical radiculitis 03/19/2011  . Type 2  diabetes mellitus with complications ([place]) 58/30/9407  . Left bundle branch block (LBBB) on electrocardiogram 04/27/2008  . Hyperlipidemia with target LDL less than 100 04/05/2008  . Essential hypertension 04/05/2008  . COPD with chronic bronchitis ([place]) 06/02/2007   Social History   Social History  . Marital Status: Married    Spouse Name: N/A  . Number of Children: N/A  . Years of Education: N/A   Occupational History  . Not on file.   Social History Main Topics  . Smoking status: Former Smoker -- 1.50 packs/day for 40 years    Types: Cigarettes    Quit date: 05/05/2002  . Smokeless tobacco: Never Used  . Alcohol Use: No  . Drug Use: No  . Sexual Activity: Not Currently   Other Topics Concern  . Not on file   Social History Narrative   Regular Exercise -  NO          [name] family history includes Arthritis in his other; Esophagitis in his mother; Hypertension in his father and mother; Prostate cancer in his father; Stroke in his mother. There is no history of Heart attack.      Objective:    Filed Vitals:   06/06/15 1351  BP: 138/62  Pulse: 88    Physical Exam   Well-developed older African-American male in no acute distress, very pleasant accompanied by his wife blood pressure 138/62 pulse 88 height 6 foot weight 222. HEENT; nontraumatic normal cephalic EOMI PERRLA sclera anicteric , Supple; no JVD, Cardiovascular; regular rate and rhythm with S1 S2 defibrillator in left chest wall, Pulmonary; clear bilaterally somewhat decreased breath sounds, abdomen soft nontender nondistended no palpable mass or hepatosplenomegaly, Rectal ;exam not done, heme negative per Dr. Ronnald Moran earlier this Moran , Extremities; trace edema  ankles, Neuropsych; mood and affect appropriate     Assessment & Plan:   #1 81 yo male with new finding of profound microcytic anemia- symptomatic with fatigue and exertional dyspnea Etiology not clear ,and currently heme negative-will need  to r/o GI source with intermittent low grade GI  blood loss #2 NICM- EF 25-30% #3 CHF- BNP 765- likely demand related with severe anemia #4 s/p ICD #5 hx adenomatous  polyps- last colon 11/2013 #6 AODM  #7HTN #8 LBBB  #9  hx prostate Ca  Plan; start Feso4 325 mg po BID Schedule for outpt blood transfusions -2 units- this Moran hemocults, and will need repeat hgb next Moran  have scheduled for Colon  and EGD with Dr Havery Moros at [facility]   Feb 22- procedures discussed in detail with pt and wife including risks and benefits and they are agreeable to proceed.  he has cardiology appt tomorrow - Dr Velva Harman PA-C 06/06/2015   Cc: Janith Lima, MD

## 2015-06-06 NOTE — Telephone Encounter (Signed)
The patient left the office before the visit was finished. Message asking the patient call me back today. I have him scheduled at the [facility] for a blood transfusion tomorrow. They need him in their office at 9:00 am.

## 2015-06-06 NOTE — Patient Instructions (Signed)
When  You have done the hemoccult cards, please bring your cards to our lab, basement level.   You have been scheduled for a colonoscopy. Please follow written instructions given to you at your visit today.  We have given you a sample of the Moviprep.  If you use inhalers (even only as needed), please bring them with you on the day of your procedure. Your physician has requested that you go to www.startemmi.com and enter the access code given to you at your visit today. This web site gives a general overview about your procedure. However, you should still follow specific instructions given to you by our office regarding your preparation for the procedure.  Beth, our nurse will call you about the tranfusion.

## 2015-06-06 NOTE — Telephone Encounter (Signed)
Patient is advised. He will go straight from the cardiology appointment to the [facility]. He knows the location.

## 2015-06-06 NOTE — Progress Notes (Signed)
Agree with assessment and plan as outlined. Symptomatic iron deficiency anemia however no overt GI blood loss. Agree with EGD and colonoscopy as soon as possible (needs to be done in the hospital) and starting IV iron, with PRBC transfusion ASAP. If he cannot maintain adequate Hgb following transfusion he will need to be admitted for an expedited workup. I would recommend empiric PPI if he is not otherwise already one one and avoid NSAIDs if possible in the interim.

## 2015-06-06 NOTE — Progress Notes (Signed)
Cardiology Office Note:    Date:  06/07/2015   ID:  [name], DOB 08-May-1934, MRN [id number]  PCP:  Scarlette Calico, MD  Cardiologist:  Dr. Dorris Carnes   Electrophysiologist:  Dr. Cristopher Peru   Chief Complaint  Patient presents with  . Congestive Heart Failure    Follow-up     History of Present Illness:     [name] is a 81 y.o. male with a hx of nonischemic cardiomyopathy, chronic systolic CHF, moderate pulmonary hypertension, LBBB, HTN, HL, COPD. EF has remained 25-30%. He underwent CRT-D implantation by Dr. Lovena Le 3/16. Last seen by Dr. Lovena Le 6/16. Recently seen by GI (Dr. Havery Moros) for symptomatic iron deficiency anemia. Hgb 06/04/15 6.5. Transfusion with PRBCs has been scheduled. He is also being started on IV iron. Colonoscopy and EGD are pending.  He was asked to follow-up sooner with cardiology. BNP was elevated. He has noticed progressively worsening dyspnea with exertion over the past several months. He was convinced that his dyspnea would improve once his CRT-D was implanted. However, it did not. He denies orthopnea or PND. He denies significant pedal edema. Denies coughing or wheezing. Denies chest pain. Denies syncope. Denies any ICD therapies.   Past Medical History  Diagnosis Date  . Asthma   . COPD (chronic obstructive pulmonary disease) (HCC)     FEV1 2.68/72%, FEV1/FVC 0.63 (08/22/04  . Colon polyps   . Hyperlipidemia   . HTN (hypertension)   . LBBB (left bundle branch block)     [name] 06/20/2014  . Prostate cancer ([place]) 2010  . Arthritis        . Non-ischemic cardiomyopathy ([place])     a. s/p STJ CRTD 07/2014  . Chronic systolic heart failure [facility])     Past Surgical History  Procedure Laterality Date  . Robot assisted laparoscopic radical prostatectomy  2010  . Cataract extraction w/ intraocular lens  implant, bilateral Bilateral 2010  . Left heart catheterization with coronary angiogram N/A 01/04/2014    no obstructive CAD  .  Bi-ventricular implantable cardioverter defibrillator N/A 07/17/2014    STJ CRTD implanted by Dr Lovena Le  . Prostatectomy      Current Medications: Outpatient Prescriptions Prior to Visit  Medication Sig Dispense Refill  . amLODipine (NORVASC) 10 MG tablet Take 1 tablet (10 mg total) by mouth daily. 90 tablet 3  . aspirin 81 MG EC tablet Take 81 mg by mouth daily.      Marland Kitchen atorvastatin (LIPITOR) 40 MG tablet Take 1 tablet (40 mg total) by mouth daily. 90 tablet 3  . carvedilol (COREG) 3.125 MG tablet TAKE 1 TABLET(3.125 MG) BY MOUTH TWICE DAILY WITH A MEAL 60 tablet 3  . DULERA 100-5 MCG/ACT AERO INHALE 2 PUFFS VIA SPACER TWICE DAILY( THEN RINSE MOUTH) 39 g 1  . Fe Cbn-Fe Gluc-FA-B12-C-DSS (FERRALET 90) 90-1 MG TABS Take 1 tablet by mouth daily. 30 each 11  . folic acid (FOLVITE) 932 MCG tablet Take 400 mcg by mouth every evening.      . Multiple Vitamins-Minerals (MULTIVITAMIN,TX-MINERALS) tablet Take 1 tablet by mouth daily.      [name] Place 1 drop into both eyes daily.     . valsartan (DIOVAN) 320 MG tablet Take 1 tablet (320 mg total) by mouth daily. 90 tablet 3  . vitamin E 400 UNIT capsule Take 400 Units by mouth daily.      . theophylline (UNIPHYL) 400 MG 24 hr tablet Reported on  06/07/2015  0   No facility-administered medications prior to visit.     Allergies:   Review of patient's allergies indicates no known allergies.   Social History   Social History  . Marital Status: Married    Spouse Name: N/A  . Number of Children: N/A  . Years of Education: N/A   Social History Main Topics  . Smoking status: Former Smoker -- 1.50 packs/day for 40 years    Types: Cigarettes    Quit date: 05/05/2002  . Smokeless tobacco: Never Used  . Alcohol Use: No  . Drug Use: No  . Sexual Activity: Not Currently   Other Topics Concern  . None   Social History Narrative   Regular Exercise -  NO           Family History:  The patient's family  history includes Arthritis in his other; Esophagitis in his mother; Hypertension in his father and mother; Prostate cancer in his father; Stroke in his mother. There is no history of Heart attack.   ROS:   Please see the history of present illness.    Review of Systems  Constitution: Positive for malaise/fatigue.  Cardiovascular: Positive for dyspnea on exertion.  All other systems reviewed and are negative.   Physical Exam:    VS:  BP 138/60 mmHg  Pulse 80  Ht 6' (1.829 m)  Wt 220 lb 1.9 oz (99.846 kg)  BMI 29.85 kg/m2  SpO2 99%   GEN: Well nourished, well developed, in no acute distress HEENT: normal Neck: no JVD, no masses Cardiac: Normal S1/S2,  RRR; no murmurs, trace ankle  Edema    Respiratory:  clear to auscultation bilaterally; no wheezing, rhonchi or rales GI: soft, nontender, nondistended, + BS MS: no deformity or atrophy Skin: warm and dry, no rash Neuro:  no focal deficits  Psych: Alert and oriented x 3, normal affect  Wt Readings from Last 3 Encounters:  06/07/15 220 lb 1.9 oz (99.846 kg)  06/06/15 222 lb (100.699 kg)  06/04/15 220 lb (99.791 kg)      Studies/Labs Reviewed:     EKG:  EKG is  ordered today.  The ekg ordered today demonstrates probable NSR with occasional BiV pacing, frequent PACs and PVCs.  Recent Labs: 07/18/2014: Magnesium 1.7 06/04/2015: ALT 9; BUN 15; Creatinine, Ser 1.01; Hemoglobin 6.5 cL*; Platelets 417.0 Giant Platelets seen on smear.*; Potassium 4.1; Pro B Natriuretic peptide (BNP) 765.0*; Sodium 140; TSH 2.58   Recent Lipid Panel    Component Value Date/Time   CHOL 99 06/04/2015 0904   TRIG 63.0 06/04/2015 0904   HDL 36.50* 06/04/2015 0904   CHOLHDL 3 06/04/2015 0904   VLDL 12.6 06/04/2015 0904   LDLCALC 50 06/04/2015 0904    Additional studies/ records that were reviewed today include:   Echo 06/08/14 EF 63-01%, grade 1 diastolic dysfunction, normal wall motion, trivial AI, mild MR, mild TR, PASP 55 mmHg  LHC 01/04/14 Left  main: No obstructive disease.  Left Anterior Descending Artery: No obstructive disease.  Circumflex Artery: No obstructive disease.  Right Coronary Artery: Large dominant vessel with no obstructive disease.  Left Ventricular Angiogram: Deferred.  Impression: 1. No angiographic evidence of CAD 2. Non-ischemic cardiomyopathy  Myoview 12/27/13 Intermediate risk stress nuclear study Large infarct involving the entire septum and inferior wall from apex to base no ischemia.   ASSESSMENT:     1. Chronic systolic heart failure ([place])   2. Non-ischemic cardiomyopathy ([place])   3. Iron deficiency anemia  4. S/P ICD (internal cardiac defibrillator) procedure   5. Essential hypertension   6. Hyperlipidemia with target LDL less than 100     PLAN:     In order of problems listed above:  1. Chronic systolic CHF - He is NYHA 2b-3. BNP 765. However, we interrogated his device today.  Corevue is at baseline indicating euvolemic status.  It appears his current clinical picture is related to his profound anemia.  He has never required Lasix.  It does not appear that he needs to be started on Lasix at this time.  I do think he should get a dose of Lasix IV after each unit of PRBCs.  Will make sure he gets back here for close FU after his transfusion as well.  -  Lasix 20 mg IV after each unit of PRBCs.  -  FU 2 weeks with Dr. Dorris Carnes or me.   2. Anemia - The patient has recently been diagnosed with symptomatic iron deficiency anemia.  He is getting a blood transfusion today. EGD and colonoscopy are pending. FU per GI.   3. Nonischemic cardiomyopathy - Continue beta-blocker, angiotensin receptor blocker.   4. S/p CRT-D - As noted, device interrogated today.  He has a hx of PVCs.  PVC burden is unchanged.  He is BiV pacing ~ 75% of the time and this is c/w his history.  He has not had any sustained episodes of VT or therapies delivered.  Corevue is at baseline.    5. HTN - Controlled.   6. HL -  LDL in 1/17 was 50. Continue statin.    Medication Adjustments/Labs and Tests Ordered: Current medicines are reviewed at length with the patient today.  Concerns regarding medicines are outlined above.  Medication changes, Labs and Tests ordered today are outlined in the Patient Instructions noted below. Patient Instructions  Medication Instructions:  YOU HAVE BEEN GIVEN AN RX FOR IV LASIX TO BE GIVEN 1 TIME AFTER EACH BLOOD TRANSFUSION Labwork: NONE  Testing/Procedures: NONE  Follow-Up: 2 WEEKS WITH Krisinda Giovanni, PAC SAME DAY DR. Harrington Challenger IS IN THE OFFICE  FOLLOW UP WITH DR. Lovena Le IN MARCH 2017  Any Other Special Instructions Will Be Listed Below (If Applicable).  If you need a refill on your cardiac medications before your next appointment, please call your pharmacy.     Signed, Richardson Dopp, PA-C  06/07/2015 8:38 AM    [facility] [place], [place], [place]  [address] Phone: [phone]; Fax: [phone]

## 2015-06-07 ENCOUNTER — Encounter: Payer: Self-pay | Admitting: Internal Medicine

## 2015-06-07 ENCOUNTER — Ambulatory Visit (INDEPENDENT_AMBULATORY_CARE_PROVIDER_SITE_OTHER): Payer: Commercial Managed Care - HMO | Admitting: *Deleted

## 2015-06-07 ENCOUNTER — Other Ambulatory Visit: Payer: Self-pay

## 2015-06-07 ENCOUNTER — Encounter: Payer: Self-pay | Admitting: Physician Assistant

## 2015-06-07 ENCOUNTER — Ambulatory Visit (HOSPITAL_COMMUNITY)
Admission: RE | Admit: 2015-06-07 | Discharge: 2015-06-07 | Disposition: A | Discharge status: Home / Self Care | Payer: Commercial Managed Care - HMO | Source: Ambulatory Visit | Attending: Physician Assistant | Admitting: Physician Assistant

## 2015-06-07 ENCOUNTER — Ambulatory Visit (INDEPENDENT_AMBULATORY_CARE_PROVIDER_SITE_OTHER): Payer: Commercial Managed Care - HMO | Admitting: Physician Assistant

## 2015-06-07 VITALS — BP 138/60 | HR 80 | Ht 72.0 in | Wt 220.1 lb

## 2015-06-07 VITALS — BP 124/56 | HR 70 | Temp 97.9°F | Resp 20

## 2015-06-07 DIAGNOSIS — I5022 Chronic systolic (congestive) heart failure: Secondary | ICD-10-CM

## 2015-06-07 DIAGNOSIS — Z95810 Presence of automatic (implantable) cardiac defibrillator: Secondary | ICD-10-CM | POA: Diagnosis not present

## 2015-06-07 DIAGNOSIS — D62 Acute posthemorrhagic anemia: Secondary | ICD-10-CM

## 2015-06-07 DIAGNOSIS — I428 Other cardiomyopathies: Secondary | ICD-10-CM

## 2015-06-07 DIAGNOSIS — E785 Hyperlipidemia, unspecified: Secondary | ICD-10-CM

## 2015-06-07 DIAGNOSIS — D509 Iron deficiency anemia, unspecified: Secondary | ICD-10-CM

## 2015-06-07 DIAGNOSIS — I10 Essential (primary) hypertension: Secondary | ICD-10-CM

## 2015-06-07 DIAGNOSIS — I429 Cardiomyopathy, unspecified: Secondary | ICD-10-CM

## 2015-06-07 LAB — CUP PACEART INCLINIC DEVICE CHECK
HIGH POWER IMPEDANCE MEASURED VALUE: 55.125
Implantable Lead Implant Date: 20160314
Implantable Lead Location: 753860
Implantable Lead Model: 7122
Lead Channel Impedance Value: 312.5 Ohm
Lead Channel Impedance Value: 375 Ohm
Lead Channel Pacing Threshold Amplitude: 0.5 V
Lead Channel Pacing Threshold Amplitude: 2 V
Lead Channel Pacing Threshold Pulse Width: 0.5 ms
Lead Channel Setting Pacing Amplitude: 2 V
Lead Channel Setting Pacing Amplitude: 3 V
Lead Channel Setting Pacing Pulse Width: 0.5 ms
Lead Channel Setting Pacing Pulse Width: 0.6 ms
MDC IDC LEAD IMPLANT DT: 20160314
MDC IDC LEAD IMPLANT DT: 20160314
MDC IDC LEAD LOCATION: 753858
MDC IDC LEAD LOCATION: 753859
MDC IDC MSMT BATTERY REMAINING LONGEVITY: 63.6
MDC IDC MSMT LEADCHNL LV PACING THRESHOLD PULSEWIDTH: 0.6 ms
MDC IDC MSMT LEADCHNL RA IMPEDANCE VALUE: 362.5 Ohm
MDC IDC MSMT LEADCHNL RA SENSING INTR AMPL: 3.2 mV
MDC IDC MSMT LEADCHNL RV SENSING INTR AMPL: 12 mV
MDC IDC PG SERIAL: 7239374
MDC IDC SESS DTM: 20170202084427
MDC IDC SET LEADCHNL RV PACING AMPLITUDE: 2 V
MDC IDC SET LEADCHNL RV SENSING SENSITIVITY: 0.5 mV
MDC IDC STAT BRADY RA PERCENT PACED: 27 %
MDC IDC STAT BRADY RV PERCENT PACED: 76 %

## 2015-06-07 LAB — PREPARE RBC (CROSSMATCH)

## 2015-06-07 MED ORDER — ACETAMINOPHEN 325 MG PO TABS
650.0000 mg | ORAL_TABLET | Freq: Once | ORAL | Status: AC
  Administered 2015-06-07: 650 mg via ORAL
  Filled 2015-06-07: qty 2

## 2015-06-07 MED ORDER — SODIUM CHLORIDE 0.9 % IV SOLN
Freq: Once | INTRAVENOUS | Status: AC
  Administered 2015-06-07: 10:00:00 via INTRAVENOUS

## 2015-06-07 MED ORDER — FUROSEMIDE 10 MG/ML IJ SOLN
20.0000 mg | Freq: Once | INTRAMUSCULAR | Status: AC
  Administered 2015-06-07: 20 mg via INTRAVENOUS
  Filled 2015-06-07: qty 2

## 2015-06-07 MED ORDER — OMEPRAZOLE 20 MG PO CPDR: 20.0000 mg | DELAYED_RELEASE_CAPSULE | Freq: Every day | ORAL | Status: DC

## 2015-06-07 NOTE — Patient Instructions (Addendum)
Medication Instructions:  YOU HAVE BEEN GIVEN AN RX FOR IV LASIX TO BE GIVEN 1 TIME AFTER EACH BLOOD TRANSFUSION Labwork: NONE  Testing/Procedures: NONE  Follow-Up: 2 WEEKS WITH SCOTT WEAVER, PAC SAME DAY DR. Harrington Challenger IS IN THE OFFICE  FOLLOW UP WITH DR. Lovena Le IN MARCH 2017  Any Other Special Instructions Will Be Listed Below (If Applicable).  If you need a refill on your cardiac medications before your next appointment, please call your pharmacy.

## 2015-06-07 NOTE — Progress Notes (Addendum)
              PCP: Nicoletta Ba PA-C Associated Diagnosis: Iron Deficiency Anemia  Procedure Note: IV transfusion of 2 units PRBC  Condition During Procedure: Pt tolerated well    Condition at Discharge: No complications noted. No reaction.

## 2015-06-07 NOTE — Progress Notes (Deleted)
MD:Amy Esterwood, PA-C  Procedure: 2 units PRBC infused without difficulty or reaction.  Patient discharged to home, wife present.

## 2015-06-07 NOTE — Progress Notes (Signed)
Quick check in clinic for Richardson Dopp, PA due to pt c/o SOB. Corvue stable. 76% BiV pacing (stable). 7.4% PVCs since June 2016. SOB related to anemia per SW. Follow up with GT as scheduled.

## 2015-06-08 LAB — TYPE AND SCREEN
ABO/RH(D): A POS
ANTIBODY SCREEN: NEGATIVE
Unit division: 0
Unit division: 0

## 2015-06-14 ENCOUNTER — Other Ambulatory Visit: Payer: Self-pay | Admitting: Physician Assistant

## 2015-06-14 ENCOUNTER — Other Ambulatory Visit (INDEPENDENT_AMBULATORY_CARE_PROVIDER_SITE_OTHER): Payer: Commercial Managed Care - HMO

## 2015-06-14 DIAGNOSIS — D509 Iron deficiency anemia, unspecified: Secondary | ICD-10-CM

## 2015-06-14 LAB — HEMOCCULT SLIDES (X 3 CARDS)
FECAL OCCULT BLD: POSITIVE — AB
OCCULT 1: POSITIVE — AB
OCCULT 2: POSITIVE — AB
OCCULT 3: POSITIVE — AB
OCCULT 4: POSITIVE — AB
OCCULT 5: POSITIVE — AB

## 2015-06-18 ENCOUNTER — Other Ambulatory Visit (INDEPENDENT_AMBULATORY_CARE_PROVIDER_SITE_OTHER): Payer: Commercial Managed Care - HMO

## 2015-06-18 DIAGNOSIS — D62 Acute posthemorrhagic anemia: Secondary | ICD-10-CM

## 2015-06-18 LAB — CBC WITH DIFFERENTIAL/PLATELET
HCT: 28.2 % — ABNORMAL LOW (ref 39.0–52.0)
MCHC: 28.9 g/dL — ABNORMAL LOW (ref 30.0–36.0)
MCV: 58.5 fl — ABNORMAL LOW (ref 78.0–100.0)
PLATELETS: 369 10*3/uL (ref 150.0–400.0)
RBC: 4.82 Mil/uL (ref 4.22–5.81)
RDW: 33 % — AB (ref 11.5–15.5)
WBC: 6.2 10*3/uL (ref 4.0–10.5)

## 2015-06-20 NOTE — Progress Notes (Signed)
Cardiology Office Note:    Date:  06/21/2015   ID:  [name], DOB 31-Oct-1934, MRN [id number]  PCP:  Woody Seller, MD  Cardiologist:  Dr. Dorris Carnes   Electrophysiologist:  Dr. Cristopher Peru   Chief Complaint  Patient presents with  . Congestive Heart Failure    Follow-up     History of Present Illness:     [name] is a 81 y.o. male with a hx of nonischemic cardiomyopathy, chronic systolic CHF, moderate pulmonary hypertension, LBBB, HTN, HL, COPD. EF has remained 25-30%. He underwent CRT-D implantation by Dr. Lovena Le 3/16. Last seen by Dr. Lovena Le 6/16. Recently seen by GI (Dr. Havery Moros) for symptomatic iron deficiency anemia. Hgb 06/04/15 6.5.  BNP was elevated and he was sent back to Cardiology for FU.  I Joseph him 2 weeks ago and his thoracic impedance on Corevue was at baseline.  I made no changes.  I did have him take IV Lasix after each unit of PRBCs transfused (he was going for transfusion later that day).   He returns for close FU.     Past Medical History  Diagnosis Date  . Asthma   . COPD (chronic obstructive pulmonary disease) (HCC)     FEV1 2.68/72%, FEV1/FVC 0.63 (08/22/04  . Colon polyps   . Hyperlipidemia   . HTN (hypertension)   . LBBB (left bundle branch block)     [name] 06/20/2014  . Prostate cancer ([place]) 2010  . Arthritis        . Non-ischemic cardiomyopathy ([place])     a. s/p STJ CRTD 07/2014  . Chronic systolic heart failure [facility])     Past Surgical History  Procedure Laterality Date  . Robot assisted laparoscopic radical prostatectomy  2010  . Cataract extraction w/ intraocular lens  implant, bilateral Bilateral 2010  . Left heart catheterization with coronary angiogram N/A 01/04/2014    no obstructive CAD  . Bi-ventricular implantable cardioverter defibrillator N/A 07/17/2014    STJ CRTD implanted by Dr Lovena Le  . Prostatectomy      Current Medications: Outpatient Prescriptions Prior to Visit  Medication Sig Dispense Refill    . acetaminophen (TYLENOL) 500 MG tablet Take 1,000 mg by mouth daily as needed for fever or headache.    Marland Kitchen amLODipine (NORVASC) 10 MG tablet Take 1 tablet (10 mg total) by mouth daily. 90 tablet 3  . aspirin 81 MG EC tablet Take 81 mg by mouth daily.      Marland Kitchen atorvastatin (LIPITOR) 40 MG tablet Take 1 tablet (40 mg total) by mouth daily. 90 tablet 3  . carvedilol (COREG) 3.125 MG tablet TAKE 1 TABLET(3.125 MG) BY MOUTH TWICE DAILY WITH A MEAL 60 tablet 3  . DULERA 100-5 MCG/ACT AERO INHALE 2 PUFFS VIA SPACER TWICE DAILY( THEN RINSE MOUTH) (Patient taking differently: INHALE 2 PUFFS VIA SPACER once DAILY( THEN RINSE MOUTH)) 39 g 1  . Fe Cbn-Fe Gluc-FA-B12-C-DSS (FERRALET 90) 90-1 MG TABS Take 1 tablet by mouth daily. 30 each 11  . folic acid (FOLVITE) 962 MCG tablet Take 400 mcg by mouth every evening.      . Multiple Vitamins-Minerals (MULTIVITAMIN,TX-MINERALS) tablet Take 1 tablet by mouth daily.      Marland Kitchen omeprazole (PRILOSEC) 20 MG capsule Take 1 capsule (20 mg total) by mouth daily. 30 capsule 3  . Polyethyl Glycol-Propyl Glycol (SYSTANE OP) Place 1 drop into both eyes daily.     . theophylline (UNIPHYL) 400 MG 24 hr tablet Take 200 mg  by mouth 2 (two) times daily. Patient takes 1/2 tablet ( 200 mg ) by mouth two times daily    . valsartan (DIOVAN) 320 MG tablet Take 1 tablet (320 mg total) by mouth daily. 90 tablet 3  . vitamin E 400 UNIT capsule Take 400 Units by mouth daily.       No facility-administered medications prior to visit.     Allergies:   Review of patient's allergies indicates no known allergies.   Social History   Social History  . Marital Status: Married    Spouse Name: N/A  . Number of Children: N/A  . Years of Education: N/A   Social History Main Topics  . Smoking status: Former Smoker -- 1.50 packs/day for 40 years    Types: Cigarettes    Quit date: 05/05/2002  . Smokeless tobacco: Never Used  . Alcohol Use: No  . Drug Use: No  . Sexual Activity: Not Currently    Other Topics Concern  . Not on file   Social History Narrative   Regular Exercise -  NO           Family History:  The patient's family history includes Arthritis in his other; Esophagitis in his mother; Hypertension in his father and mother; Prostate cancer in his father; Stroke in his mother. There is no history of Heart attack.   ROS:   Please see the history of present illness.    ROSAll other systems reviewed and are negative.   Physical Exam:    VS:  There were no vitals taken for this visit.   GEN: Well nourished, well developed, in no acute distress HEENT: normal Neck: no JVD, no masses Cardiac: Normal S1/S2,  RRR; no murmurs, trace ankle  Edema    Respiratory:  clear to auscultation bilaterally; no wheezing, rhonchi or rales GI: soft, nontender, nondistended, + BS MS: no deformity or atrophy Skin: warm and dry, no rash Neuro:  no focal deficits  Psych: Alert and oriented x 3, normal affect  Wt Readings from Last 3 Encounters:  06/07/15 220 lb 1.9 oz (99.846 kg)  06/06/15 222 lb (100.699 kg)  06/04/15 220 lb (99.791 kg)      Studies/Labs Reviewed:     EKG:  EKG is  ordered today.  The ekg ordered today demonstrates    Recent Labs: 07/18/2014: Magnesium 1.7 06/04/2015: ALT 9; BUN 15; Creatinine, Ser 1.01; Potassium 4.1; Pro B Natriuretic peptide (BNP) 765.0*; Sodium 140; TSH 2.58 06/18/2015: Hemoglobin 8.1 Repeated and verified X2.*; Platelets 369.0   Recent Lipid Panel    Component Value Date/Time   CHOL 99 06/04/2015 0904   TRIG 63.0 06/04/2015 0904   HDL 36.50* 06/04/2015 0904   CHOLHDL 3 06/04/2015 0904   VLDL 12.6 06/04/2015 0904   LDLCALC 50 06/04/2015 0904    Additional studies/ records that were reviewed today include:   Echo 06/08/14 EF 63-87%, grade 1 diastolic dysfunction, normal wall motion, trivial AI, mild MR, mild TR, PASP 55 mmHg  LHC 01/04/14 Left main: No obstructive disease.  Left Anterior Descending Artery: No obstructive  disease.  Circumflex Artery: No obstructive disease.  Right Coronary Artery: Large dominant vessel with no obstructive disease.  Left Ventricular Angiogram: Deferred.  Impression: 1. No angiographic evidence of CAD 2. Non-ischemic cardiomyopathy  Myoview 12/27/13 Intermediate risk stress nuclear study Large infarct involving the entire septum and inferior wall from apex to base no ischemia.   ASSESSMENT:     1. Chronic systolic heart failure ([place])  2. Iron deficiency anemia   3. Non-ischemic cardiomyopathy ([place])   4. ICD (implantable cardioverter-defibrillator), biventricular, in situ   5. Essential hypertension   6. Hyperlipidemia with target LDL less than 100     PLAN:     In order of problems listed above:  1. Chronic systolic CHF - He is NYHA   2. Anemia - The patient has recently been diagnosed with symptomatic iron deficiency anemia.  EGD and colonoscopy are pending. FU per GI.   3. Nonischemic cardiomyopathy - Continue beta-blocker, angiotensin receptor blocker.   4. S/p CRT-D - FU with EP as planned.   5. HTN - Controlled.   6. HL - LDL in 1/17 was 50. Continue statin.    Medication Adjustments/Labs and Tests Ordered: Current medicines are reviewed at length with the patient today.  Concerns regarding medicines are outlined above.  Medication changes, Labs and Tests ordered today are outlined in the Patient Instructions noted below. There are no Patient Instructions on file for this visit.  Signed, Richardson Dopp, PA-C  06/21/2015 8:22 AM    [facility] [place], [place], [place]  [address] Phone: [phone]; Fax: [phone]     This encounter was created in error - please disregard.

## 2015-06-21 ENCOUNTER — Encounter: Payer: Commercial Managed Care - HMO | Admitting: Physician Assistant

## 2015-06-25 ENCOUNTER — Ambulatory Visit: Payer: Commercial Managed Care - HMO | Admitting: Physician Assistant

## 2015-06-26 ENCOUNTER — Encounter (HOSPITAL_COMMUNITY): Payer: Self-pay | Admitting: *Deleted

## 2015-06-26 NOTE — Progress Notes (Signed)
Pt's cardiologist is Dr. Harrington Challenger. Pt has an ICD, implanted on 07/27/14. Pt denies chest pain. Does have SOB, but states they think it's due to his blood being low. Received 2 units of blood last week and he states his breathing is "a little" better. Last visit to the cardiologist was 06/07/15.   Echo - 06/08/14 - in EPIC Cath - 01/04/14 - in [place] - 12/27/13 - in EPIC EKG - 06/07/15 - in EPIC.

## 2015-06-27 ENCOUNTER — Ambulatory Visit (HOSPITAL_COMMUNITY)
Admission: RE | Admit: 2015-06-27 | Discharge: 2015-06-27 | Disposition: A | Discharge status: Home / Self Care | Payer: Commercial Managed Care - HMO | Source: Ambulatory Visit | Attending: Gastroenterology | Admitting: Gastroenterology

## 2015-06-27 ENCOUNTER — Encounter (HOSPITAL_COMMUNITY): Payer: Self-pay

## 2015-06-27 ENCOUNTER — Telehealth: Payer: Self-pay | Admitting: Gastroenterology

## 2015-06-27 ENCOUNTER — Telehealth: Payer: Self-pay | Admitting: *Deleted

## 2015-06-27 ENCOUNTER — Ambulatory Visit (HOSPITAL_COMMUNITY): Payer: Commercial Managed Care - HMO | Admitting: Critical Care Medicine

## 2015-06-27 ENCOUNTER — Encounter (HOSPITAL_COMMUNITY)
Admission: RE | Disposition: A | Discharge status: Home / Self Care | Payer: Self-pay | Source: Ambulatory Visit | Attending: Gastroenterology

## 2015-06-27 ENCOUNTER — Other Ambulatory Visit: Payer: Self-pay | Admitting: *Deleted

## 2015-06-27 DIAGNOSIS — Z7951 Long term (current) use of inhaled steroids: Secondary | ICD-10-CM | POA: Insufficient documentation

## 2015-06-27 DIAGNOSIS — I10 Essential (primary) hypertension: Secondary | ICD-10-CM | POA: Diagnosis not present

## 2015-06-27 DIAGNOSIS — C169 Malignant neoplasm of stomach, unspecified: Secondary | ICD-10-CM | POA: Insufficient documentation

## 2015-06-27 DIAGNOSIS — I110 Hypertensive heart disease with heart failure: Secondary | ICD-10-CM | POA: Insufficient documentation

## 2015-06-27 DIAGNOSIS — Z8546 Personal history of malignant neoplasm of prostate: Secondary | ICD-10-CM | POA: Diagnosis not present

## 2015-06-27 DIAGNOSIS — J449 Chronic obstructive pulmonary disease, unspecified: Secondary | ICD-10-CM | POA: Insufficient documentation

## 2015-06-27 DIAGNOSIS — E785 Hyperlipidemia, unspecified: Secondary | ICD-10-CM | POA: Diagnosis not present

## 2015-06-27 DIAGNOSIS — Z95810 Presence of automatic (implantable) cardiac defibrillator: Secondary | ICD-10-CM | POA: Insufficient documentation

## 2015-06-27 DIAGNOSIS — Z7982 Long term (current) use of aspirin: Secondary | ICD-10-CM | POA: Diagnosis not present

## 2015-06-27 DIAGNOSIS — E119 Type 2 diabetes mellitus without complications: Secondary | ICD-10-CM | POA: Insufficient documentation

## 2015-06-27 DIAGNOSIS — Z79899 Other long term (current) drug therapy: Secondary | ICD-10-CM | POA: Diagnosis not present

## 2015-06-27 DIAGNOSIS — C162 Malignant neoplasm of body of stomach: Secondary | ICD-10-CM | POA: Insufficient documentation

## 2015-06-27 DIAGNOSIS — I5022 Chronic systolic (congestive) heart failure: Secondary | ICD-10-CM | POA: Insufficient documentation

## 2015-06-27 DIAGNOSIS — K2950 Unspecified chronic gastritis without bleeding: Secondary | ICD-10-CM | POA: Diagnosis not present

## 2015-06-27 DIAGNOSIS — I447 Left bundle-branch block, unspecified: Secondary | ICD-10-CM | POA: Diagnosis not present

## 2015-06-27 DIAGNOSIS — K319 Disease of stomach and duodenum, unspecified: Secondary | ICD-10-CM | POA: Diagnosis not present

## 2015-06-27 DIAGNOSIS — K3189 Other diseases of stomach and duodenum: Secondary | ICD-10-CM | POA: Insufficient documentation

## 2015-06-27 DIAGNOSIS — I428 Other cardiomyopathies: Secondary | ICD-10-CM | POA: Diagnosis not present

## 2015-06-27 DIAGNOSIS — Q396 Congenital diverticulum of esophagus: Secondary | ICD-10-CM | POA: Diagnosis not present

## 2015-06-27 DIAGNOSIS — J45909 Unspecified asthma, uncomplicated: Secondary | ICD-10-CM | POA: Diagnosis not present

## 2015-06-27 DIAGNOSIS — Z87891 Personal history of nicotine dependence: Secondary | ICD-10-CM | POA: Diagnosis not present

## 2015-06-27 DIAGNOSIS — D509 Iron deficiency anemia, unspecified: Secondary | ICD-10-CM | POA: Insufficient documentation

## 2015-06-27 DIAGNOSIS — R5383 Other fatigue: Secondary | ICD-10-CM

## 2015-06-27 HISTORY — DX: Reserved for inherently not codable concepts without codable children: IMO0001

## 2015-06-27 HISTORY — DX: Anemia, unspecified: D64.9

## 2015-06-27 HISTORY — DX: Personal history of other medical treatment: Z92.89

## 2015-06-27 HISTORY — PX: ESOPHAGOGASTRODUODENOSCOPY (EGD) WITH PROPOFOL: SHX5813

## 2015-06-27 LAB — POCT I-STAT 4, (NA,K, GLUC, HGB,HCT)
Glucose, Bld: 106 mg/dL — ABNORMAL HIGH (ref 65–99)
HEMATOCRIT: 31 % — AB (ref 39.0–52.0)
Hemoglobin: 10.5 g/dL — ABNORMAL LOW (ref 13.0–17.0)
Potassium: 4 mmol/L (ref 3.5–5.1)
SODIUM: 140 mmol/L (ref 135–145)

## 2015-06-27 SURGERY — Surgical Case
Anesthesia: *Unknown

## 2015-06-27 SURGERY — ESOPHAGOGASTRODUODENOSCOPY (EGD) WITH PROPOFOL
Anesthesia: Monitor Anesthesia Care

## 2015-06-27 MED ORDER — PROPOFOL 500 MG/50ML IV EMUL
INTRAVENOUS | Status: DC | PRN
  Administered 2015-06-27: 150 ug/kg/min via INTRAVENOUS

## 2015-06-27 MED ORDER — OMEPRAZOLE 20 MG PO CPDR: 20.0000 mg | DELAYED_RELEASE_CAPSULE | Freq: Two times a day (BID) | ORAL | Status: DC

## 2015-06-27 MED ORDER — SODIUM CHLORIDE 0.9 % IV SOLN: INTRAVENOUS | Status: DC

## 2015-06-27 MED ORDER — LACTATED RINGERS IV SOLN
INTRAVENOUS | Status: DC
  Administered 2015-06-27: 1000 mL via INTRAVENOUS

## 2015-06-27 MED ORDER — PROPOFOL 10 MG/ML IV BOLUS
INTRAVENOUS | Status: DC | PRN
  Administered 2015-06-27 (×2): 20 mg via INTRAVENOUS

## 2015-06-27 NOTE — Anesthesia Procedure Notes (Signed)
Procedure Name: MAC Date/Time: 06/27/2015 9:33 AM Performed by: Merrilyn Puma B Pre-anesthesia Checklist: Patient identified, Timeout performed, Emergency Drugs available, Suction available and Patient being monitored Patient Re-evaluated:Patient Re-evaluated prior to inductionOxygen Delivery Method: Nasal cannula Intubation Type: IV induction Placement Confirmation: positive ETCO2 and breath sounds checked- equal and bilateral Dental Injury: Teeth and Oropharynx as per pre-operative assessment

## 2015-06-27 NOTE — Anesthesia Postprocedure Evaluation (Signed)
Anesthesia Post Note  Patient: [name]  Procedure(s) Performed: Procedure(s) (LRB): ESOPHAGOGASTRODUODENOSCOPY (EGD) WITH PROPOFOL (N/A)  Patient location during evaluation: PACU Anesthesia Type: MAC Level of consciousness: awake and alert Pain management: pain level controlled Vital Signs Assessment: post-procedure vital signs reviewed and stable Respiratory status: spontaneous breathing, nonlabored ventilation, respiratory function stable and patient connected to nasal cannula oxygen Cardiovascular status: stable and blood pressure returned to baseline Anesthetic complications: no    Last Vitals:  Filed Vitals:   06/27/15 1025 06/27/15 1030  BP:    Pulse: 71 71  Temp:    Resp: 18 22    Last Pain: There were no vitals filed for this visit.               [place]

## 2015-06-27 NOTE — Op Note (Signed)
[facility] [place], [address]   ENDOSCOPY PROCEDURE REPORT  PATIENT: Joseph, Moran  MR#: [id number] BIRTHDATE: 1934-06-25 , 80  yrs. old GENDER: male ENDOSCOPIST: Yetta Flock, MD REFERRED BY: PROCEDURE DATE:  06/27/2015 PROCEDURE:  EGD w/ biopsy ASA CLASS:     Class III INDICATIONS:  iron deficiency anemia. MEDICATIONS: Per Anesthesia TOPICAL ANESTHETIC:  DESCRIPTION OF PROCEDURE: After the risks benefits and alternatives of the procedure were thoroughly explained, informed consent was obtained.  The    endoscope was introduced through the mouth and advanced to the second portion of the duodenum , Without limitations.  The instrument was slowly withdrawn as the mucosa was fully examined.   FINDINGS:There was an esophageal diverticulum in the distal esophagus just proximal to the GEJ.  The remainder of the esophagus was normal.  The DH, GEH, and SCJ were located 47cm from the incisors.  There was a large gastric mass, several cm in size, extending from the lesser curvature of the distal gastric body, down the incisura, and approximating the pylorus within the antrum. The mass was ulcerated and quite friable.  Multiple biopsies were taken.  The pylorus was visualized and was patent and the endoscope could easily traverse the mass.  The remainder of the stomach was normal and biopsies were taken to rule out H pylori.  The duodenal bulb and 2nd portion of the duodenum were normal.  Retroflexed views revealed no abnormalities.     The scope was then withdrawn from the patient and the procedure completed.  COMPLICATIONS: There were no immediate complications.  ENDOSCOPIC IMPRESSION: Esophageal diverticulum (benign) Large gastric mass as described above, which is most certainly accounting for iron deficiency anemia. Multiple biopsies obtained. Gastric biopsies also obtained to rule out H pylori Normal  duodenum  RECOMMENDATIONS: Resume diet Resume medications Increase omeprazole to twice daily dosing No NSAIDs Await pathology results, you will be contacted as soon as this is available Given colonoscopy in 2015 without significant pathology, colonoscopy was not performed today given EGD findings CT of the chest / abdomen / pelvis as soon as possible, our office will coordinate this appointment for you   eSigned:  Yetta Flock, MD 06/27/2015 10:04 AM    CC: the patient  PATIENT NAME:  Joseph, Moran MR#: [id number]

## 2015-06-27 NOTE — Telephone Encounter (Signed)
-----   Message from Manus Gunning, MD sent at 06/27/2015 10:23 AM EST ----- Rollene Fare, I just did an EGD on this patient at [name], outpatient. He has a very large gastric mass, almost certainly cancer. I just reviewed the report with him and his family. I am recommending he have a CT C/A/P as soon as possible in the next few days, and I will then get the pathology results back to them as soon as its available. I am assuming he will need an Oncology consult pending the CT and path. Can you call them today to coordinate the CT? Thanks

## 2015-06-27 NOTE — Telephone Encounter (Signed)
Signed off, thanks for coordinating

## 2015-06-27 NOTE — Anesthesia Preprocedure Evaluation (Addendum)
Anesthesia Evaluation  Patient identified by MRN, date of birth, ID band Patient awake    Reviewed: Allergy & Precautions, NPO status , Patient's Chart, lab work & pertinent test results  Airway Mallampati: I  TM Distance: >3 FB Neck ROM: Full    Dental  (+) Dental Advisory Given   Pulmonary shortness of breath and with exertion, asthma , COPD, former smoker,    Pulmonary exam normal        Cardiovascular hypertension, Pt. on medications + DOE  Normal cardiovascular exam+ Cardiac Defibrillator   Echo 06/08/14 - - Left ventricle: The cavity size was mildly dilated. Systolic function was severely reduced. The estimated ejection fraction was in the range of 25% to 30%. Wall motion was normal; there were no regional wall motion abnormalities. Doppler parameters are consistent with abnormal left ventricular relaxation (grade 1 diastolic dysfunction). Doppler parameters are consistent with elevated ventricular end-diastolic filling pressure. - Ventricular septum: Septal motion showed paradox. - Aortic valve: There was trivial regurgitation. - Aortic root: The aortic root was normal in size. - Mitral valve: Structurally normal valve. There was mild regurgitation. - Right ventricle: Systolic function was normal. - Right atrium: The atrium was normal in size. - Tricuspid valve: There was mild regurgitation. - Pulmonic valve: There was no regurgitation. - Pulmonary arteries: Systolic pressure was moderately increased. PA peak pressure: 55 mm Hg (S). - Inferior vena cava: The vessel was normal in size. - Pericardium, extracardiac: There was no pericardial effusion.   Neuro/Psych    GI/Hepatic   Endo/Other  diabetes, Type 2  Renal/GU      Musculoskeletal  (+) Arthritis ,   Abdominal   Peds  Hematology  (+) anemia , H/H 8.1/28.2   Anesthesia Other Findings   Reproductive/Obstetrics                            Anesthesia Physical Anesthesia Plan  ASA: III  Anesthesia Plan: MAC   Post-op Pain Management:    Induction: Intravenous  Airway Management Planned: Nasal Cannula  Additional Equipment:   Intra-op Plan:   Post-operative Plan:   Informed Consent: I have reviewed the patients History and Physical, chart, labs and discussed the procedure including the risks, benefits and alternatives for the proposed anesthesia with the patient or authorized representative who has indicated his/her understanding and acceptance.   Dental advisory given  Plan Discussed with: Surgeon and CRNA  Anesthesia Plan Comments:        Anesthesia Quick Evaluation

## 2015-06-27 NOTE — Telephone Encounter (Signed)
Patient given appointment date, time and instructions. 

## 2015-06-27 NOTE — H&P (View-Only) (Signed)
Patient ID: [name], male   DOB: 22-Dec-1934, 81 y.o.   MRN: [id number]   Subjective:    Patient ID: [name], male    DOB: 13-Aug-1934, 81 y.o.   MRN: [id number]  HPI  [name]  Is a very nice 81 year old African-American male previously known to Dr. Delfin Edis from prior colonoscopies who is referred today by Dr. Ronnald Ramp for evaluation of profound microcytic anemia.  Patient has history of nonischemic cardiomyopathy with EF of 25-30% , he is status post ICD placement, has history of congestive heart failure left bundle-branch block, COPD, hypertension, and adult-onset diabetes mellitus.  He had physical done within the past week with Dr. Ronnald Ramp and routine labs were done showing a hemoglobin of 6.5 hematocrit of 23.5, MCV of 53 , BNP elevated at 765, serum iron 17 transferrin 280 5 and iron sat 4.3.  Reviewing records hemoglobin in March 2016 was 9.3 hematocrit of 29.2 MCV of 68 and in August 2015 hemoglobin 10.8 hematocrit of 34 MCV of 77.  patient has no current GI complaints. He says he has been feeling extremely fatigued gets very winded with any sort of exertion. He says he can walk to his mailbox downhill but has a very difficult time on the incline back to the house is very short of breath and has to immediately sit down. He denies any chest pain. Patient has no complaints of abdominal discomfort, no changes in bowel habits no melena or hematochezia. Appetite has been fine he has lost a few pounds but has been trying. No complaints of heartburn indigestion dysphagia. He is not anticoagulated but does take a baby aspirin daily..  Last colonoscopy done in July 2015 for history of polyps. Patient found to have 5 polyps all 5-9 mm size range with complete resections 3 of these were tubular adenomas, 2 hyperplastic. Also noted mild diverticulosis. Add prior EGD  No prior history of documented iron deficiency anemia  He does have cardiology appointment tomorrow morning with Dr. Lovena Le  Re: Complaints of fatigue and elevated BNP.  Review of Systems Pertinent positive and negative review of systems were noted in the above HPI section.  All other review of systems was otherwise negative.  Outpatient Encounter Prescriptions as of 06/06/2015  Medication Sig  . amLODipine (NORVASC) 10 MG tablet Take 1 tablet (10 mg total) by mouth daily.  Marland Kitchen aspirin 81 MG EC tablet Take 81 mg by mouth daily.    Marland Kitchen atorvastatin (LIPITOR) 40 MG tablet Take 1 tablet (40 mg total) by mouth daily.  . carvedilol (COREG) 3.125 MG tablet TAKE 1 TABLET(3.125 MG) BY MOUTH TWICE DAILY WITH A MEAL  . DULERA 100-5 MCG/ACT AERO INHALE 2 PUFFS VIA SPACER TWICE DAILY( THEN RINSE MOUTH)  . Fe Cbn-Fe Gluc-FA-B12-C-DSS (FERRALET 90) 90-1 MG TABS Take 1 tablet by mouth daily.  . folic acid (FOLVITE) 371 MCG tablet Take 400 mcg by mouth every evening.    . Multiple Vitamins-Minerals (MULTIVITAMIN,TX-MINERALS) tablet Take 1 tablet by mouth daily.    [name] Place 1 drop into both eyes daily.   . theophylline (UNIPHYL) 400 MG 24 hr tablet TK 1/2 T PO BID WF  . valsartan (DIOVAN) 320 MG tablet Take 1 tablet (320 mg total) by mouth daily.  . vitamin E 400 UNIT capsule Take 400 Units by mouth daily.     No facility-administered encounter medications on file as of 06/06/2015.   No Known Allergies Patient Active Problem List  Diagnosis Date Noted  . Non-ischemic cardiomyopathy ([place]) 06/06/2015  . Ventricular ectopy 06/04/2015  . DOE (dyspnea on exertion) 06/04/2015  . Iron deficiency anemia 06/04/2015  . Seasonal and perennial allergic rhinitis 08/13/2014  . S/P ICD (internal cardiac defibrillator) procedure 07/17/2014  . Chronic systolic heart failure ([place]) 06/20/2014  . NEOPLASM, MALIGNANT, PROSTATE, HX OF, S/P TURP 05/31/2012  . Routine general medical examination at a health care facility 05/31/2012  . Pernicious anemia 04/15/2011  . Cervical radiculitis 03/19/2011  . Type 2  diabetes mellitus with complications ([place]) 34/28/7681  . Left bundle branch block (LBBB) on electrocardiogram 04/27/2008  . Hyperlipidemia with target LDL less than 100 04/05/2008  . Essential hypertension 04/05/2008  . COPD with chronic bronchitis ([place]) 06/02/2007   Social History   Social History  . Marital Status: Married    Spouse Name: N/A  . Number of Children: N/A  . Years of Education: N/A   Occupational History  . Not on file.   Social History Main Topics  . Smoking status: Former Smoker -- 1.50 packs/day for 40 years    Types: Cigarettes    Quit date: 05/05/2002  . Smokeless tobacco: Never Used  . Alcohol Use: No  . Drug Use: No  . Sexual Activity: Not Currently   Other Topics Concern  . Not on file   Social History Narrative   Regular Exercise -  NO          Mr. Huster family history includes Arthritis in his other; Esophagitis in his mother; Hypertension in his father and mother; Prostate cancer in his father; Stroke in his mother. There is no history of Heart attack.      Objective:    Filed Vitals:   06/06/15 1351  BP: 138/62  Pulse: 88    Physical Exam   Well-developed older African-American male in no acute distress, very pleasant accompanied by his wife blood pressure 138/62 pulse 88 height 6 foot weight 222. HEENT; nontraumatic normal cephalic EOMI PERRLA sclera anicteric , Supple; no JVD, Cardiovascular; regular rate and rhythm with S1 S2 defibrillator in left chest wall, Pulmonary; clear bilaterally somewhat decreased breath sounds, abdomen soft nontender nondistended no palpable mass or hepatosplenomegaly, Rectal ;exam not done, heme negative per Dr. Ronnald Ramp earlier this week , Extremities; trace edema  ankles, Neuropsych; mood and affect appropriate     Assessment & Plan:   #1 81 yo male with new finding of profound microcytic anemia- symptomatic with fatigue and exertional dyspnea Etiology not clear ,and currently heme negative-will need  to r/o GI source with intermittent low grade GI  blood loss #2 NICM- EF 25-30% #3 CHF- BNP 765- likely demand related with severe anemia #4 s/p ICD #5 hx adenomatous  polyps- last colon 11/2013 #6 AODM  #7HTN #8 LBBB  #9  hx prostate Ca  Plan; start Feso4 325 mg po BID Schedule for outpt blood transfusions -2 units- this week hemocults, and will need repeat hgb next week  have scheduled for Colon  and EGD with Dr Havery Moros at [facility]   Feb 22- procedures discussed in detail with pt and wife including risks and benefits and they are agreeable to proceed.  he has cardiology appt tomorrow - Dr Velva Harman PA-C 06/06/2015   Cc: Janith Lima, MD

## 2015-06-27 NOTE — Transfer of Care (Signed)
Immediate Anesthesia Transfer of Care Note  Patient: [name]  Procedure(s) Performed: Procedure(s): ESOPHAGOGASTRODUODENOSCOPY (EGD) WITH PROPOFOL (N/A)  Patient Location: Endoscopy Unit  Anesthesia Type:MAC  Level of Consciousness: awake, alert  and oriented  Airway & Oxygen Therapy: Patient Spontanous Breathing and Patient connected to nasal cannula oxygen  Post-op Assessment: Report given to RN, Post -op Vital signs reviewed and stable and Patient moving all extremities X 4  Post vital signs: Reviewed and stable  Last Vitals:  Filed Vitals:   06/27/15 0833  BP: 147/80  Pulse: 84  Temp: 36.7 C  Resp: 14    Complications: No apparent anesthesia complications

## 2015-06-27 NOTE — Telephone Encounter (Signed)
Spoke with Marzetta Board at [facility] with Marzetta Board at [place] and scheduled patient on 06/28/15 at 3:00 PM. Needs to be there at 2:30 PM. No contrast from [place]. He will drink when he gets there. NPO 4 hours prior. Left a message for patient to call back.

## 2015-06-27 NOTE — Discharge Instructions (Signed)
Esophagogastrectomy, Care After Refer to this sheet in the next few weeks. These instructions provide you with information on caring for yourself after your procedure. Your caregiver may also give you more specific instructions. Your treatment has been planned according to current medical practices, but problems sometimes occur. Call your caregiver if you have any problems or questions after your procedure. HOME CARE INSTRUCTIONS   Know how to care for your temporary feeding tube. You will be given instructions on how to use it before you leave the hospital. You will have one for about 2-3 weeks. The feeding tube will be removed when you are able to take an oral diet that is sufficient. A home health nurse may come to your home to supervise your use of the tube.  Take all medicines as directed.  Do not use over-the-counter medicines, vitamins, or herbal supplements without your caregiver's approval.  Do not drive while you are using narcotic pain medicines or medicines for nausea.  Follow the diet prescribed by your caregiver.  Follow your caregiver's recommendations for activity level, amount of weight you can lift, and time frame for return to work. These recommendations will depend on the type of surgery you had and your general health.  Care for your surgical cuts (incisions) and bandages (dressings) as directed by your caregiver.  Check with your surgeon before you fly on a plane. Air travel may be allowed 1 week after surgery.  You may shower unless instructed otherwise. Do not scrub the incisions. Pat them dry. The use of your arms overhead to wash your hair may cause fatigue, shortness of breath, or pain. You may need someone to help you.  No baths, hot tubs, or swimming until your caregiver approves.  Check with your surgeon before resuming sexual activity. Sexual activity may be restricted for 4-6 weeks after surgery to allow the incisions to heal adequately.  Keep all follow-up  appointments as directed by your caregiver. SEEK MEDICAL CARE IF:   You have increased pain that does not improve with medicine.  You have trouble swallowing.  You have heartburn or indigestion.  You feel nauseous or vomit.  You cannot have a bowel movement (constipated) or have diarrhea.  You have a rash.  You have burning with urination, or need to urinate more frequently than usual.  You have a new cough. SEEK IMMEDIATE MEDICAL CARE IF:   You have a fever or persistent symptoms for more than 2-3 days.  You have a fever and your symptoms suddenly get worse.  You have chills.  You have chest pain.  You have shortness of breath.  Your dressing on your incision becomes soaked with blood.  Your incision becomes red, opens up, or has a creamy or bad smelling discharge.  You have a severe increase in pain.  You have pain, tenderness, or redness in your calf.   This information is not intended to replace advice given to you by your health care provider. Make sure you discuss any questions you have with your health care provider.   Document Released: 10/21/2011 Document Revised: 02/09/2013 Document Reviewed: 10/21/2011 Elsevier Interactive Patient Education [employer].

## 2015-06-27 NOTE — Interval H&P Note (Signed)
History and Physical Interval Note:  06/27/2015 9:25 AM  Marylee Floras  has presented today for surgery, with the diagnosis of Iron deficiency anemia Fatigue  The various methods of treatment have been discussed with the patient and family. After consideration of risks, benefits and other options for treatment, the patient has consented to  Procedure(s): COLONOSCOPY WITH PROPOFOL (N/A) ESOPHAGOGASTRODUODENOSCOPY (EGD) WITH PROPOFOL (N/A) as a surgical intervention .  The patient's history has been reviewed, patient examined, no change in status, stable for surgery.  I have reviewed the patient's chart and labs.  Questions were answered to the patient's satisfaction.     Renelda Loma Mariesha Venturella

## 2015-06-28 ENCOUNTER — Ambulatory Visit (INDEPENDENT_AMBULATORY_CARE_PROVIDER_SITE_OTHER)
Admit: 2015-06-28 | Discharge: 2015-06-28 | Disposition: A | Discharge status: Home / Self Care | Payer: Commercial Managed Care - HMO | Attending: Gastroenterology | Admitting: Gastroenterology

## 2015-06-28 ENCOUNTER — Telehealth: Payer: Self-pay | Admitting: Gastroenterology

## 2015-06-28 ENCOUNTER — Ambulatory Visit (INDEPENDENT_AMBULATORY_CARE_PROVIDER_SITE_OTHER)
Admission: RE | Admit: 2015-06-28 | Discharge: 2015-06-28 | Disposition: A | Discharge status: Home / Self Care | Payer: Commercial Managed Care - HMO | Source: Ambulatory Visit | Attending: Gastroenterology | Admitting: Gastroenterology

## 2015-06-28 ENCOUNTER — Encounter (HOSPITAL_COMMUNITY): Payer: Self-pay | Admitting: Gastroenterology

## 2015-06-28 DIAGNOSIS — K319 Disease of stomach and duodenum, unspecified: Secondary | ICD-10-CM | POA: Diagnosis not present

## 2015-06-28 DIAGNOSIS — R918 Other nonspecific abnormal finding of lung field: Secondary | ICD-10-CM | POA: Diagnosis not present

## 2015-06-28 DIAGNOSIS — K3189 Other diseases of stomach and duodenum: Secondary | ICD-10-CM

## 2015-06-28 DIAGNOSIS — C169 Malignant neoplasm of stomach, unspecified: Secondary | ICD-10-CM

## 2015-06-28 DIAGNOSIS — R1909 Other intra-abdominal and pelvic swelling, mass and lump: Secondary | ICD-10-CM | POA: Diagnosis not present

## 2015-06-28 MED ORDER — IOHEXOL 300 MG/ML  SOLN
100.0000 mL | Freq: Once | INTRAMUSCULAR | Status: AC | PRN
  Administered 2015-06-28: 100 mL via INTRAVENOUS

## 2015-06-28 NOTE — Telephone Encounter (Signed)
I have attempted to call the patient multiple times on home and cell phone this evening but could not get through. The listed contact for family is his son [name], at [phone], whom I had met at the endoscopy and the patient was okay with me discussing the results with the son. Pathology shows the gastric mass is adenocarcinoma. CT shows the large mass in the stomach with lymphadenopathy in the area and in the chest, with some pulmonary nodules, which isn't clear if this is metastatic disease or not to the lung. I am recommending an ASAP consultation with Oncology at this time to discuss further testing (?PET) and treatment options. The son with discuss with the patient and let me know if he has any further questions I am happy to chat with him.   Rollene Fare can you please set him up to see oncology as soon as possible. If he has metastatic disease which is possible based on CT, would be better to see them first prior to a surgical consult, unless Oncology is recommending surgical consult regardless we can also coordinate that. Thanks

## 2015-06-29 ENCOUNTER — Telehealth: Payer: Self-pay | Admitting: *Deleted

## 2015-06-29 NOTE — Telephone Encounter (Signed)
Patient notified that referral has been made. Note sent to oncology navigator also.

## 2015-06-29 NOTE — Telephone Encounter (Signed)
Oncology Nurse Navigator Documentation  Oncology Nurse Navigator Flowsheets 06/29/2015  Navigator Location [facility]  Navigator Encounter Type Introductory phone call  Abnormal Finding Date 06/27/2015  Confirmed Diagnosis Date 06/27/2015  Spoke with patient and his wife and  provided new patient appointment for 07/06/15 at 0845/0900 in [facility] [place] to see Dr. [name] and Dr. Barry Dienes. Informed of location of [place], valet service, and registration process. Reminded to bring insurance cards and a current medication list, including supplements. Patient verbalizes understanding. He is aware of the cancer center since his wife comes for anemia treatment. Faxed demographics to [facility].

## 2015-06-29 NOTE — Telephone Encounter (Signed)
Referral made to oncology.

## 2015-07-06 ENCOUNTER — Ambulatory Visit: Payer: Commercial Managed Care - HMO | Admitting: Oncology

## 2015-07-06 ENCOUNTER — Encounter: Payer: Self-pay | Admitting: Oncology

## 2015-07-06 ENCOUNTER — Ambulatory Visit (HOSPITAL_BASED_OUTPATIENT_CLINIC_OR_DEPARTMENT_OTHER): Payer: Commercial Managed Care - HMO | Admitting: Oncology

## 2015-07-06 ENCOUNTER — Encounter: Payer: Self-pay | Admitting: *Deleted

## 2015-07-06 ENCOUNTER — Telehealth: Payer: Self-pay | Admitting: *Deleted

## 2015-07-06 ENCOUNTER — Ambulatory Visit: Payer: Commercial Managed Care - HMO | Admitting: Nutrition

## 2015-07-06 ENCOUNTER — Other Ambulatory Visit: Payer: Self-pay | Admitting: General Surgery

## 2015-07-06 ENCOUNTER — Ambulatory Visit: Payer: Commercial Managed Care - HMO | Attending: Oncology | Admitting: Physical Therapy

## 2015-07-06 ENCOUNTER — Telehealth: Payer: Self-pay | Admitting: Oncology

## 2015-07-06 VITALS — BP 157/72 | HR 88 | Temp 97.8°F | Resp 18 | Ht 72.0 in | Wt 224.4 lb

## 2015-07-06 DIAGNOSIS — R269 Unspecified abnormalities of gait and mobility: Secondary | ICD-10-CM | POA: Insufficient documentation

## 2015-07-06 DIAGNOSIS — C162 Malignant neoplasm of body of stomach: Secondary | ICD-10-CM | POA: Diagnosis not present

## 2015-07-06 DIAGNOSIS — Z8546 Personal history of malignant neoplasm of prostate: Secondary | ICD-10-CM

## 2015-07-06 DIAGNOSIS — D509 Iron deficiency anemia, unspecified: Secondary | ICD-10-CM

## 2015-07-06 DIAGNOSIS — R531 Weakness: Secondary | ICD-10-CM | POA: Diagnosis not present

## 2015-07-06 DIAGNOSIS — D510 Vitamin B12 deficiency anemia due to intrinsic factor deficiency: Secondary | ICD-10-CM

## 2015-07-06 DIAGNOSIS — C163 Malignant neoplasm of pyloric antrum: Secondary | ICD-10-CM | POA: Diagnosis not present

## 2015-07-06 NOTE — Progress Notes (Signed)
[name]  GI Clinic Psychosocial Distress Screening Clinical Social Work  Clinical Social Work met with pt, wife and his two sons at GI Clinic to introduce self, review distress screening protocol and assess needs.  The patient scored a 3 on the Psychosocial Distress Thermometer which indicates mild distress. Pt feels less distressed and reports he usually "lets things go" that he can't control. Pt appears to have good support from family. Wife reports she plans to bring him to appointments. They were interested in support programs and CSW reviewed offerings with them. Pt and wife report they had all their questions answered today. Wife shared she may need additional support and CSW reviewed options for that today. Pt and family agree to reach out as needs arise.   ONCBCN DISTRESS SCREENING 07/06/2015  Screening Type Initial Screening  Distress experienced in past week (1-10) 3  Emotional problem type Adjusting to illness  Information Concerns Type Lack of info about diagnosis;Lack of info about treatment;Lack of info about complementary therapy choices;Lack of info about maintaining fitness  Physical Problem type Breathing;Tingling hands/feet;Skin dry/itchy  Physician notified of physical symptoms Yes  Referral to clinical social work Yes  Referral to dietition Yes  Referral to support programs Yes    Clinical Social Worker follow up needed: No.  If yes, follow up plan: [name], [place]  [facility] Phone: [phone] Fax: [phone]

## 2015-07-06 NOTE — Progress Notes (Signed)
Oncology Nurse Navigator Documentation  Oncology Nurse Navigator Flowsheets 07/06/2015  Navigator Location [facility]  Navigator Encounter Type Clinic/MDC  Abnormal Finding Date -  Confirmed Diagnosis Date -  Patient Visit Type MedOnc;Surgery  Treatment Phase Pre-Tx/Tx Discussion  Barriers/Navigation Needs Coordination of Care;Education  Education Understanding Cancer/ Treatment Options;Coping with Diagnosis/ Prognosis;Newly Diagnosed Cancer Education;Preparing for Upcoming Surgery/ Treatment  Interventions Coordination of Care;Education Method  Coordination of Care Radiology;Appts--scheduled PET and left message for managed care to precert;scheduled chemo class for same day as PET  Education Method Verbal;Written;Teach-back  Support Groups/Services GI Support Group;[employer]--Personal Health Manager  Acuity Level 2  Time Spent with Patient 59  Met with patient, wife and children during new patient visit. Explained the role of the GI Nurse Navigator and provided New Patient Packet with information on: 1. Gastric cancer 2. Support groups 3. Advanced Directives 4. Fall Safety Plan Answered questions, reviewed current treatment plan using TEACH back and provided emotional support. Provided copy of current treatment plan. Provided prep instructions for his PET on 07/12/15.  Merceda Elks, RN, BSN GI Oncology [place]

## 2015-07-06 NOTE — Therapy (Signed)
[facility] [address] [place], [place], [address] Phone: [phone]   Fax:  [phone]  Physical Therapy Evaluation  Patient Details  Name: [name] MRN: [id number] Date of Birth: Jan 31, 1935 Referring Provider: Dr. Betsy Coder  Encounter Date: 07/06/2015      PT End of Session - 07/06/15 1231    Visit Number 1   Date for PT Re-Evaluation 08/31/15   Authorization Type Medicare G-code at 10th visit; Kx modifier at 15   PT Start Time 1202   PT Stop Time 1225   PT Time Calculation (min) 23 min   Activity Tolerance Patient tolerated treatment well   Behavior During Therapy [facility] for tasks assessed/performed      Past Medical History  Diagnosis Date  . Asthma   . COPD (chronic obstructive pulmonary disease) (HCC)     FEV1 2.68/72%, FEV1/FVC 0.63 (08/22/04  . Colon polyps   . Hyperlipidemia   . HTN (hypertension)   . LBBB (left bundle branch block)     [name] 06/20/2014  . Prostate cancer ([place]) 2010  . Arthritis        . Non-ischemic cardiomyopathy ([place])     a. s/p STJ CRTD 07/2014  . Chronic systolic heart failure ([place])   . AICD (automatic cardioverter/defibrillator) present   . Shortness of breath dyspnea   . Anemia   . History of blood transfusion   . Pneumonia     Past Surgical History  Procedure Laterality Date  . Robot assisted laparoscopic radical prostatectomy  2010  . Cataract extraction w/ intraocular lens  implant, bilateral Bilateral 2010  . Left heart catheterization with coronary angiogram N/A 01/04/2014    no obstructive CAD  . Bi-ventricular implantable cardioverter defibrillator N/A 07/17/2014    STJ CRTD implanted by Dr Lovena Le  . Prostatectomy    . Colonoscopy    . Esophagogastroduodenoscopy (egd) with propofol N/A 06/27/2015    Procedure: ESOPHAGOGASTRODUODENOSCOPY (EGD) WITH PROPOFOL;  Surgeon: Manus Gunning, MD;  Location: [place];  Service: Gastroenterology;   Laterality: N/A;    There were no vitals filed for this visit.  Visit Diagnosis:  Abnormality of gait - Plan: PT plan of care cert/re-cert  Weakness - Plan: PT plan of care cert/re-cert      Subjective Assessment - 07/06/15 1222    Subjective Patient is attending GI clinic today.  He is being evaluated further for treatment.  Patient is deconditioned and Dr. Barry Dienes the surgeon feels he would benefit from strengthening program to assist in  better outcome for possible surgery or other treatment options. Patien is winded after going to mailbox and has trouble with vacuuming.  Patient is not on exercise program and sits majority of day.    Patient Stated Goals increase enduranc and strength   Currently in Pain? No/denies            [facility] PT Assessment - 07/06/15 0001    Assessment   Medical Diagnosis Gastric Cancer   Referring Provider Dr. Betsy Coder   Onset Date/Surgical Date 06/27/15   Prior Therapy None   Precautions   Precautions Other (comment)   Precaution Comments Cancer precautions   Balance Screen   Has the patient fallen in the past 6 months No   Has the patient had a decrease in activity level because of a fear of falling?  No   Is the patient reluctant to leave their home because of a fear of falling?  No  Home Environment   Living Environment Private residence   Living Arrangements Spouse/significant other   Available Help at Discharge Family   Type of [place] to enter   Home Layout One level   Prior Function   Level of Independence Independent   Vocation Retired   Associate Professor   Overall Cognitive Status Within Functional Limits for tasks assessed   Observation/Other Assessments   Focus on Therapeutic Outcomes (FOTO)  Therapist discretion 50% limitation due to COPD and gastric cancer   Posture/Postural Control   Posture/Postural Control No significant limitations   ROM / Strength   AROM / PROM / Strength Strength   Strength    Overall Strength Comments bil. hip extension 4/5, hip abduction 4/5   Standardized Balance Assessment   Five times sit to stand comments  23 seconds   Berg Balance Test   Sit to Stand Able to stand without using hands and stabilize independently   Standing Unsupported Able to stand safely 2 minutes   Sitting with Back Unsupported but Feet Supported on Floor or Stool Able to sit safely and securely 2 minutes   Stand to Sit Sits safely with minimal use of hands   Transfers Able to transfer safely, minor use of hands   Standing Unsupported with Eyes Closed Able to stand 10 seconds safely   Standing Ubsupported with Feet Together Able to place feet together independently and stand 1 minute safely   From Standing, Reach Forward with Outstretched Arm Can reach confidently >25 cm (10")   From Standing Position, Pick up Object from Floor Able to pick up shoe safely and easily   From Standing Position, Turn to Look Behind Over each Shoulder Looks behind one side only/other side shows less weight shift   Turn 360 Degrees Able to turn 360 degrees safely but slowly  6 seconds   Standing Unsupported, Alternately Place Feet on Step/Stool Able to stand independently and safely and complete 8 steps in 20 seconds  19 sec   Standing Unsupported, One Foot in Front Able to plae foot ahead of the other independently and hold 30 seconds   Standing on One Leg Tries to lift leg/unable to hold 3 seconds but remains standing independently   Total Score 49   Berg comment: 50% chance to fall                           PT Education - 07/06/15 1231    Education provided No          PT Short Term Goals - 07/06/15 1239    PT SHORT TERM GOAL #1   Title understand tips to avoid falls   Time 4   Period Weeks   Status New   PT SHORT TERM GOAL #2   Title Berg balance score >/= 52/56   Time 4   Period Weeks   Status New   PT SHORT TERM GOAL #3   Title sit to stand </= 18 sec   Time 4    Period Weeks   Status New   PT SHORT TERM GOAL #4   Title go to the mailbox with 25% less fatique   Time 4   Period Weeks   Status New   PT SHORT TERM GOAL #5   Title vacuum house with 25% less fatique   Time 4   Period Weeks   Status New  PT Long Term Goals - 07-10-2015 1240    PT LONG TERM GOAL #1   Title independent with HEP   Time 8   Period Weeks   Status New   PT LONG TERM GOAL #2   Title Berg balance score is 56/56 to reduce risk of falls    Time 8   Period Weeks   Status New   PT LONG TERM GOAL #3   Title sit to stand </= 12 seconds to show increased endrance for furher cancer treatment    Time 8   Period Weeks   Status New   PT LONG TERM GOAL #4   Title walk to mailbox with 50% less fatique due to increased endurance   Time 8   Period Weeks   Status New   PT LONG TERM GOAL #5   Title vacuum home with 50% less fatique due to increased endurance   Time 8   Period Weeks   Status New               Plan - 10-Jul-2015 07/19/1230    Clinical Impression Statement Patient is a 81 year old male with diagnosis of Gastric cancer on 06/27/2015 by endoscopy.  Patient reports no pain.  Patient has attended GI clinic where he saw the team and the team consulted on his care.  Patient is having furhter testing for treatment and possible surgery.  At this time patient is deconditioned  and would  recover from  cancer treatment if he was stronger. Balance score is 49/56 indicating 50% chance  of falling.  Patient goes from sit to stand 5 times in 23 seconds slowly.  Bilateral hip extension and abduction is 4/5.  Patient is winded with walking to the mailbox on a hill and after vacuuming.  Patient is presently retired with no exercise routine.  Patient history consists of COPD, heart condition, hypertension and history of prostate cancer.  Patient is of  moderate complexity.  Patient would benefit from physical therapy to increase strength and endurance to prepare for cancer  treatment.    Pt will benefit from skilled therapeutic intervention in order to improve on the following deficits Decreased balance;Decreased activity tolerance;Decreased endurance;Decreased strength   Rehab Potential Excellent   Clinical Impairments Affecting Rehab Potential COPD and heart condition; presently has gastric cancer   PT Frequency 2x / week   PT Duration 8 weeks   PT Treatment/Interventions ADLs/Self Care Home Management;Balance training;Therapeutic exercise;Therapeutic activities;Functional mobility training;Neuromuscular re-education;Patient/family education;Energy conservation   PT Next Visit Plan endurance training, balance exercises, tips to avoid falls, overall strengthening   PT Home Exercise Plan tips to avoid falls   Recommended Other Services none   Consulted and Agree with Plan of Care Patient;Family member/caregiver   Family Member Consulted wife and 2 sons          G-Codes - July 10, 2015 07/18/1241    Functional Assessment Tool Used Therapist discrection due to berg balance score 49/56 and deconditioned  goal is 30% limitation   Functional Limitation Mobility: Walking and moving around   Mobility: Walking and Moving Around Current Status [phone]) At least 40 percent but less than 60 percent impaired, limited or restricted   Mobility: Walking and Moving Around Goal Status [phone]) At least 1 percent but less than 20 percent impaired, limited or restricted       Problem List Patient Active Problem List   Diagnosis Date Noted  . Gastric cancer ([place]) 06/27/2015  . Anemia, iron deficiency   .  Gastric mass   . Non-ischemic cardiomyopathy ([place]) 06/06/2015  . Ventricular ectopy 06/04/2015  . DOE (dyspnea on exertion) 06/04/2015  . Iron deficiency anemia 06/04/2015  . Seasonal and perennial allergic rhinitis 08/13/2014  . ICD (implantable cardioverter-defibrillator), biventricular, in situ 07/17/2014  . Chronic systolic heart failure ([place]) 06/20/2014  . NEOPLASM,  MALIGNANT, PROSTATE, HX OF, S/P TURP 05/31/2012  . Routine general medical examination at a health care facility 05/31/2012  . Pernicious anemia 04/15/2011  . Cervical radiculitis 03/19/2011  . Type 2 diabetes mellitus with complications ([place]) 80/99/8338  . Left bundle branch block (LBBB) on electrocardiogram 04/27/2008  . Hyperlipidemia with target LDL less than 100 04/05/2008  . Essential hypertension 04/05/2008  . COPD with chronic bronchitis ([place]) 06/02/2007    Earlie Counts, PT 07/06/2015 12:46 PM   [facility] [address] [place], [place], [address] Phone: [phone]   Fax:  [phone]  Name: GADGE HERMIZ MRN: [id number] Date of Birth: Jun 12, 1934

## 2015-07-06 NOTE — Telephone Encounter (Signed)
per pof to sch pt appt already sch

## 2015-07-06 NOTE — Telephone Encounter (Addendum)
Call received from spouse for "refill of Ferralet 90 mg twice daily.  He has three pills left."   With further investigation, this nurse called her back with instructions to call Dr. Ronnald Ramp for this refill.  07-09-2015 refill completed.

## 2015-07-06 NOTE — Progress Notes (Signed)
Patient was seen in GI clinic.  81 year old male diagnosed with gastric cancer.  He is a patient of Dr. Julieanne Manson.  Past medical history includes asthma, COPD, hyperlipidemia, hypertension, prostate cancer, 2010, and anemia.  Medications include Folvite, multivitamin, Prilosec, vitamin E.  Labs include glucose 106 on February 22.  Height: 6 feet 0 inches. Weight: 224.4 pounds. Usual body weight: 240 pounds. BMI: 30.43.  Patient reports he is been trying to lose weight over the last year, accounting for 15 pound weight loss. Currently weight has stabilized and patient reports he has a good appetite and is consuming 3 meals a day. Patient does not typically snack between meals.  Nutrition diagnosis:  Food and nutrition related knowledge deficit related to new diagnosis of gastric cancer and associated treatments as evidenced by no prior need for nutrition related information.  Intervention:  Patient educated to consume meals with snacks in between consisting of adequate calories and protein to promote maintenance of lean body mass. Recommended smaller meals and increased snacks, during chemotherapy. Discouraged patient to attempt further weight loss. Provided fact sheet on increasing calories and protein. Questions were answered.  Teach back method used.  Contact information was given.  Monitoring, evaluation, goals: Patient will tolerate adequate calories and protein to promote maintenance of lean body mass.  Next visit: to be scheduled as needed.  **Disclaimer: This note was dictated with voice recognition software. Similar sounding words can inadvertently be transcribed and this note may contain transcription errors which may not have been corrected upon publication of note.**

## 2015-07-06 NOTE — Telephone Encounter (Signed)
gv and printed pt appt.I sent the Rx to the pharmacy. msge to stephanie mosher at [facility] to sched pt for appt

## 2015-07-06 NOTE — Patient Instructions (Signed)
Care Plan Summary- 07/06/2015 Name:  [name]     DOB:  1934/07/05 Your Medical Team: Medical Oncologist:  Dr. Ma Rings Radiation Oncologist:   Surgeon:   Dr. Stark Klein Type of Cancer: Gastric Cancer  Stage/Grade: Undermined *Exact staging of your cancer is based on size of the tumor, depth of invasion, involvement of lymph nodes or not, and whether or not the cancer has spread beyond the primary site   Recommendations: Based on information available as of today's consult. Recommendations may change depending on the results of further tests or exams. 1) Neoadjuvant chemotherapy with FOLFOX every 2 weeks 2) PET scan to complete staging (07/12/15) 3)  Next Steps: 1) Porta cath-Dr. Marlowe Aschoff office will call with appointment 2) Return to Dr. [name] on 07/13/15 with labs to review scan and plans 3) Schedule for chemo class on 07/12/15 after PET scan ______________________________________________________________________________   Questions? Joseph Elks, RN, BSN at [phone]. [name] is your Oncology Nurse Navigator and is available to assist you while you're receiving your medical care at [facility].

## 2015-07-06 NOTE — Progress Notes (Signed)
[place] Patient Consult   Referring MD: Trevyn Lumpkin 81 y.o.  Oct 22, 1934    Reason for Referral: Gastric cancer   HPI: [name] was found to have iron deficiency anemia when he saw Dr. Ronnald Ramp for a physical. The hemoglobin returned at 6.5 with an MCV of 53. He was referred to Dr. Havery Moros. He was transfused 2 units of packed red blood cells and started on iron. He was taken to an upper endoscopy on 06/27/2015. A large gastric mass was noted extending from the lesser curvature of the distal gastric body. The mass was ulcerated and friable. Biopsies were obtained. The duodenum appeared normal. The pathology ([id number]) revealed adenocarcinoma.  He was referred for staging CT scans 06/28/2015. Borderline mediastinal and hilar nodes were noted. A 27.5 mm AP window node and a 14.5 mm subcarinal node were seen. There is a distal esophageal diverticulum. Moderate changes of emphysema. Multiple small and ill-defined pulmonary nodules were noted. A small area of hyperenhancement was noted in segment 4A of the liver and a metastasis could not be excluded. A large circumferential mass was noted at the distal body and antrum of the stomach along the lesser curvature. Adjacent adenopathy measured 3 x 2.4 cm. Small nodes were seen anterior to the stomach. No pelvic mass or adenopathy.  He is referred to the multidisciplinary GI oncology clinic.  Past Medical History  Diagnosis Date  . Asthma   . COPD (chronic obstructive pulmonary disease) (HCC)     FEV1 2.68/72%, FEV1/FVC 0.63 (08/22/04  . Colon polyps   . Hyperlipidemia   . HTN (hypertension)   . LBBB (left bundle branch block)     [name] 06/20/2014  . Prostate cancer ([place]) 2010  . Arthritis        . Non-ischemic cardiomyopathy ([place])     a. s/p STJ CRTD 07/2014  . Chronic systolic heart failure ([place])   . AICD (automatic cardioverter/defibrillator) present   . Shortness of breath dyspnea    . Anemia-Iron deficiency   January 2017   .    Marland Kitchen History of Pneumonia     Past Surgical History  Procedure Laterality Date  . Robot assisted laparoscopic radical prostatectomy  2010  . Cataract extraction w/ intraocular lens  implant, bilateral Bilateral 2010  . Left heart catheterization with coronary angiogram N/A 01/04/2014    no obstructive CAD  . Bi-ventricular implantable cardioverter defibrillator N/A 07/17/2014    STJ CRTD implanted by Dr Lovena Le  .     Marland Kitchen Colonoscopy    . Esophagogastroduodenoscopy (egd) with propofol N/A 06/27/2015    Procedure: ESOPHAGOGASTRODUODENOSCOPY (EGD) WITH PROPOFOL;  Surgeon: Manus Gunning, MD;  Location: [place];  Service: Gastroenterology;  Laterality: N/A;    Medications: Reviewed  Allergies: No Known Allergies  Family history: His father had prostate cancer. His brother had prostate cancer.  Social History:   He lives in [place]. 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 He quit smoking cigarettes 15 years ago. He quit alcohol 20 years ago.    ROS:   Positives include: 15-20 pound intentional weight loss over the past year, exertional dyspnea, chronic sinus drainage  A complete ROS was otherwise negative.  Physical Exam:  Blood pressure 157/72, pulse 88, temperature 97.8 F (36.6 C), temperature source Oral, resp. rate 18, height 6' (1.829 m), weight 224 lb 6.4 oz (101.787 kg), SpO2 95 %.  HEENT: Oropharynx without visible mass, upper and lower  denture plate, 3-4 mm polypoid lesion at the posterior left tongue Lungs: Clear bilaterally Cardiac: Regular rate and rhythm Abdomen: No hepatosplenomegaly, no mass, nontender GU: Uncircumcised male, testes without mass  Vascular: No leg edema Lymph nodes: No cervical, supraclavicular, axillary, or inguinal nodes Neurologic: Alert and oriented, the motor exam appears intact in the upper and lower extremities Skin: Plaque of brown hyperpigmentation at the  right and left upper arm/shoulder area Musculoskeletal: No spine tenderness   LAB:  CBC  Lab Results  Component Value Date   WBC 6.2 06/18/2015   HGB 10.5* 06/27/2015   HCT 31.0* 06/27/2015   MCV 58.5 Repeated and verified X2.* 06/18/2015   PLT 369.0 06/18/2015   NEUTROABS 3.5 07/10/2014     CMP      Component Value Date/Time   NA 140 06/27/2015 0838   K 4.0 06/27/2015 0838   CL 107 06/04/2015 0904   CO2 25 06/04/2015 0904   GLUCOSE 106* 06/27/2015 0838   BUN 15 06/04/2015 0904   CREATININE 1.01 06/04/2015 0904   CALCIUM 8.9 06/04/2015 0904   PROT 6.9 06/04/2015 0904   ALBUMIN 3.6 06/04/2015 0904   AST 18 06/04/2015 0904   ALT 9 06/04/2015 0904   ALKPHOS 103 06/04/2015 0904   BILITOT 0.5 06/04/2015 0904   GFRNONAA 70* 07/18/2014 0538   GFRAA 81* 07/18/2014 0538      Imaging:CT chest, abdomen, and pelvis 06/28/2015-reviewed     Assessment/Plan:   1. Gastric Cancer  Lesser curvature gastric body mass noted on endoscopy 06/27/2015, biopsy confirmed adenocarcinoma  Staging CT scans to 06/28/2015 with a gastric body mass, adenopathy adjacent to the stomach, chest adenopathy, a single enhancing liver lesion, and indeterminate nodular pulmonary lesions 2.   COPD  3.   Nonischemic cardiomyopathy  4.   Implanted cardiac defibrillator  5.   History of prostate cancer  6.   Hypertension  7.   Iron deficiency anemia   Disposition:   [name] has been diagnosed with gastric cancer. His case was presented at the GI tumor conference earlier this week and he was seen in the multidisciplinary GI oncology clinic today. I discussed the case with Dr. Barry Dienes. I reviewed the imaging studies with [name] and his family.  We discussed neoadjuvant chemotherapy followed by surgery. He will not be a candidate for surgery if he is found to have distant metastatic disease. I am concerned the chest adenopathy and lung nodules may indicate metastases.  [name]  will be referred for a staging PET scan and return for an office visit in one week. He will take iron twice daily.  He would like to proceed with a trial of systemic therapy even if he is found to have metastatic disease on the PET scan. Dr.Byerly will place a Port-A-Cath.  Approximately 50 minutes were spent with the patient today. The majority of the time was used for counseling and coordination of care.  Betsy Coder, MD  07/06/2015, 2:05 PM

## 2015-07-09 ENCOUNTER — Other Ambulatory Visit: Payer: Self-pay | Admitting: General Surgery

## 2015-07-09 ENCOUNTER — Telehealth: Payer: Self-pay | Admitting: *Deleted

## 2015-07-09 DIAGNOSIS — D510 Vitamin B12 deficiency anemia due to intrinsic factor deficiency: Secondary | ICD-10-CM

## 2015-07-09 DIAGNOSIS — D509 Iron deficiency anemia, unspecified: Secondary | ICD-10-CM

## 2015-07-09 MED ORDER — FERRALET 90 90-1 MG PO TABS: 1.0000 | ORAL_TABLET | Freq: Two times a day (BID) | ORAL | Status: DC

## 2015-07-09 NOTE — Telephone Encounter (Signed)
Wife left msg on triage Friday afternoon stating pt is needing updated script sent to [facility]. He is now taking the Ferralent twice a day. Notified wife new rx was sent to pharmacy...[name]

## 2015-07-10 ENCOUNTER — Encounter (HOSPITAL_COMMUNITY): Payer: Self-pay | Admitting: *Deleted

## 2015-07-10 NOTE — Progress Notes (Signed)
EKG-06/07/15-EPIC CT Chest-06/28/15- EPIC  ECHO-06/08/14-EPIC  2015- Stress- EPIC  06/07/15-LOV- cardiology- EPIC  02/09/15- LOV- pulmonary- EPIC  06/27/15- IStat- EPIC - hgb-10.5  Last Device check- 06/07/15- EPIC

## 2015-07-11 ENCOUNTER — Ambulatory Visit (HOSPITAL_COMMUNITY): Payer: Commercial Managed Care - HMO

## 2015-07-11 ENCOUNTER — Encounter (HOSPITAL_COMMUNITY): Payer: Self-pay | Admitting: Anesthesiology

## 2015-07-11 ENCOUNTER — Other Ambulatory Visit: Payer: Self-pay | Admitting: *Deleted

## 2015-07-11 ENCOUNTER — Telehealth: Payer: Self-pay | Admitting: Internal Medicine

## 2015-07-11 ENCOUNTER — Telehealth: Payer: Self-pay | Admitting: *Deleted

## 2015-07-11 ENCOUNTER — Ambulatory Visit (HOSPITAL_COMMUNITY): Payer: Commercial Managed Care - HMO | Admitting: Anesthesiology

## 2015-07-11 ENCOUNTER — Ambulatory Visit (HOSPITAL_COMMUNITY)
Admission: RE | Admit: 2015-07-11 | Discharge: 2015-07-11 | Disposition: A | Discharge status: Home / Self Care | Payer: Commercial Managed Care - HMO | Source: Ambulatory Visit | Attending: General Surgery | Admitting: General Surgery

## 2015-07-11 ENCOUNTER — Encounter (HOSPITAL_COMMUNITY)
Admission: RE | Disposition: A | Discharge status: Home / Self Care | Payer: Self-pay | Source: Ambulatory Visit | Attending: General Surgery

## 2015-07-11 DIAGNOSIS — C169 Malignant neoplasm of stomach, unspecified: Secondary | ICD-10-CM | POA: Insufficient documentation

## 2015-07-11 DIAGNOSIS — D649 Anemia, unspecified: Secondary | ICD-10-CM | POA: Insufficient documentation

## 2015-07-11 DIAGNOSIS — Z8546 Personal history of malignant neoplasm of prostate: Secondary | ICD-10-CM | POA: Diagnosis not present

## 2015-07-11 DIAGNOSIS — C162 Malignant neoplasm of body of stomach: Secondary | ICD-10-CM

## 2015-07-11 DIAGNOSIS — I447 Left bundle-branch block, unspecified: Secondary | ICD-10-CM | POA: Diagnosis not present

## 2015-07-11 DIAGNOSIS — Z95828 Presence of other vascular implants and grafts: Secondary | ICD-10-CM

## 2015-07-11 DIAGNOSIS — E119 Type 2 diabetes mellitus without complications: Secondary | ICD-10-CM | POA: Insufficient documentation

## 2015-07-11 DIAGNOSIS — E785 Hyperlipidemia, unspecified: Secondary | ICD-10-CM | POA: Insufficient documentation

## 2015-07-11 DIAGNOSIS — I10 Essential (primary) hypertension: Secondary | ICD-10-CM | POA: Insufficient documentation

## 2015-07-11 DIAGNOSIS — Z7982 Long term (current) use of aspirin: Secondary | ICD-10-CM | POA: Insufficient documentation

## 2015-07-11 DIAGNOSIS — Z79899 Other long term (current) drug therapy: Secondary | ICD-10-CM | POA: Insufficient documentation

## 2015-07-11 DIAGNOSIS — J45909 Unspecified asthma, uncomplicated: Secondary | ICD-10-CM | POA: Diagnosis not present

## 2015-07-11 DIAGNOSIS — M1990 Unspecified osteoarthritis, unspecified site: Secondary | ICD-10-CM | POA: Diagnosis not present

## 2015-07-11 DIAGNOSIS — C163 Malignant neoplasm of pyloric antrum: Secondary | ICD-10-CM | POA: Diagnosis not present

## 2015-07-11 DIAGNOSIS — Z95810 Presence of automatic (implantable) cardiac defibrillator: Secondary | ICD-10-CM | POA: Diagnosis not present

## 2015-07-11 DIAGNOSIS — Z87891 Personal history of nicotine dependence: Secondary | ICD-10-CM | POA: Insufficient documentation

## 2015-07-11 DIAGNOSIS — I5022 Chronic systolic (congestive) heart failure: Secondary | ICD-10-CM | POA: Diagnosis not present

## 2015-07-11 DIAGNOSIS — I110 Hypertensive heart disease with heart failure: Secondary | ICD-10-CM | POA: Diagnosis not present

## 2015-07-11 DIAGNOSIS — J449 Chronic obstructive pulmonary disease, unspecified: Secondary | ICD-10-CM | POA: Diagnosis not present

## 2015-07-11 DIAGNOSIS — Z452 Encounter for adjustment and management of vascular access device: Secondary | ICD-10-CM | POA: Diagnosis not present

## 2015-07-11 HISTORY — PX: PORTACATH PLACEMENT: SHX2246

## 2015-07-11 HISTORY — DX: Malignant neoplasm of pyloric antrum: C16.3

## 2015-07-11 LAB — CBC
HCT: 28.2 % — ABNORMAL LOW (ref 39.0–52.0)
Hemoglobin: 7.6 g/dL — ABNORMAL LOW (ref 13.0–17.0)
MCH: 17.1 pg — ABNORMAL LOW (ref 26.0–34.0)
MCHC: 27 g/dL — ABNORMAL LOW (ref 30.0–36.0)
MCV: 63.5 fL — ABNORMAL LOW (ref 78.0–100.0)
Platelets: 294 K/uL (ref 150–400)
RBC: 4.44 MIL/uL (ref 4.22–5.81)
RDW: 28.6 % — ABNORMAL HIGH (ref 11.5–15.5)
WBC: 5.6 K/uL (ref 4.0–10.5)

## 2015-07-11 LAB — BASIC METABOLIC PANEL
ANION GAP: 10 (ref 5–15)
BUN: 13 mg/dL (ref 6–20)
CALCIUM: 8.9 mg/dL (ref 8.9–10.3)
CO2: 22 mmol/L (ref 22–32)
CREATININE: 0.86 mg/dL (ref 0.61–1.24)
Chloride: 109 mmol/L (ref 101–111)
Glucose, Bld: 97 mg/dL (ref 65–99)
Potassium: 4 mmol/L (ref 3.5–5.1)
SODIUM: 141 mmol/L (ref 135–145)

## 2015-07-11 SURGERY — INSERTION, TUNNELED CENTRAL VENOUS DEVICE, WITH PORT
Anesthesia: Monitor Anesthesia Care

## 2015-07-11 MED ORDER — BUPIVACAINE HCL (PF) 0.25 % IJ SOLN
INTRAMUSCULAR | Status: AC
  Filled 2015-07-11: qty 30

## 2015-07-11 MED ORDER — MEPERIDINE HCL 50 MG/ML IJ SOLN: 6.2500 mg | INTRAMUSCULAR | Status: DC | PRN

## 2015-07-11 MED ORDER — BUPIVACAINE HCL (PF) 0.25 % IJ SOLN
INTRAMUSCULAR | Status: DC | PRN
  Administered 2015-07-11: 10 mL

## 2015-07-11 MED ORDER — HYDROCODONE-ACETAMINOPHEN 5-325 MG PO TABS: 1.0000 | ORAL_TABLET | ORAL | Status: DC | PRN

## 2015-07-11 MED ORDER — ONDANSETRON HCL 4 MG/2ML IJ SOLN: 4.0000 mg | Freq: Once | INTRAMUSCULAR | Status: DC | PRN

## 2015-07-11 MED ORDER — CEFAZOLIN SODIUM-DEXTROSE 2-3 GM-% IV SOLR
INTRAVENOUS | Status: AC
  Filled 2015-07-11: qty 50

## 2015-07-11 MED ORDER — HYDROMORPHONE HCL 1 MG/ML IJ SOLN: 0.2500 mg | INTRAMUSCULAR | Status: DC | PRN

## 2015-07-11 MED ORDER — PROPOFOL 10 MG/ML IV BOLUS
INTRAVENOUS | Status: AC
  Filled 2015-07-11: qty 20

## 2015-07-11 MED ORDER — HEPARIN SOD (PORK) LOCK FLUSH 100 UNIT/ML IV SOLN
INTRAVENOUS | Status: DC | PRN
  Administered 2015-07-11: 500 [IU]

## 2015-07-11 MED ORDER — LIDOCAINE-PRILOCAINE 2.5-2.5 % EX CREA: 1.0000 "application " | TOPICAL_CREAM | CUTANEOUS | Status: AC | PRN

## 2015-07-11 MED ORDER — PROPOFOL 500 MG/50ML IV EMUL
INTRAVENOUS | Status: DC | PRN
  Administered 2015-07-11: 75 ug/kg/min via INTRAVENOUS

## 2015-07-11 MED ORDER — PROPOFOL 10 MG/ML IV BOLUS
INTRAVENOUS | Status: DC | PRN
  Administered 2015-07-11: 50 mg via INTRAVENOUS

## 2015-07-11 MED ORDER — FENTANYL CITRATE (PF) 100 MCG/2ML IJ SOLN
INTRAMUSCULAR | Status: DC | PRN
  Administered 2015-07-11: 12.5 ug via INTRAVENOUS
  Administered 2015-07-11: 25 ug via INTRAVENOUS
  Administered 2015-07-11: 12.5 ug via INTRAVENOUS

## 2015-07-11 MED ORDER — LIDOCAINE-EPINEPHRINE (PF) 1 %-1:200000 IJ SOLN
INTRAMUSCULAR | Status: DC | PRN
  Administered 2015-07-11: 10 mL

## 2015-07-11 MED ORDER — PROCHLORPERAZINE MALEATE 10 MG PO TABS: 10.0000 mg | ORAL_TABLET | Freq: Four times a day (QID) | ORAL | Status: DC | PRN

## 2015-07-11 MED ORDER — HEPARIN SOD (PORK) LOCK FLUSH 100 UNIT/ML IV SOLN
INTRAVENOUS | Status: AC
Start: 2015-07-11 — End: 2015-07-11
  Filled 2015-07-11: qty 5

## 2015-07-11 MED ORDER — FENTANYL CITRATE (PF) 100 MCG/2ML IJ SOLN
INTRAMUSCULAR | Status: AC
  Filled 2015-07-11: qty 2

## 2015-07-11 MED ORDER — LACTATED RINGERS IV SOLN
INTRAVENOUS | Status: DC
  Administered 2015-07-11: 11:00:00 via INTRAVENOUS

## 2015-07-11 MED ORDER — CEFAZOLIN SODIUM-DEXTROSE 2-3 GM-% IV SOLR
2.0000 g | INTRAVENOUS | Status: AC
  Administered 2015-07-11: 2 g via INTRAVENOUS

## 2015-07-11 MED ORDER — PHENYLEPHRINE HCL 10 MG/ML IJ SOLN
INTRAMUSCULAR | Status: DC | PRN
  Administered 2015-07-11: 40 ug via INTRAVENOUS

## 2015-07-11 MED ORDER — SODIUM CHLORIDE 0.9 % IV SOLN
Freq: Once | INTRAVENOUS | Status: AC
  Administered 2015-07-11: 13:00:00
  Filled 2015-07-11: qty 1.2

## 2015-07-11 MED ORDER — LIDOCAINE-EPINEPHRINE (PF) 1 %-1:200000 IJ SOLN
INTRAMUSCULAR | Status: AC
  Filled 2015-07-11: qty 30

## 2015-07-11 SURGICAL SUPPLY — 36 items
ADH SKN CLS APL DERMABOND .7 (GAUZE/BANDAGES/DRESSINGS) ×1
BAG DECANTER FOR FLEXI CONT (MISCELLANEOUS) ×2 IMPLANT
BLADE HEX COATED 2.75 (ELECTRODE) ×2 IMPLANT
BLADE SURG 15 STRL LF DISP TIS (BLADE) ×1 IMPLANT
BLADE SURG 15 STRL SS (BLADE) ×2
BLADE SURG SZ11 CARB STEEL (BLADE) ×2 IMPLANT
CHLORAPREP W/TINT 26ML (MISCELLANEOUS) ×2 IMPLANT
COVER SURGICAL LIGHT HANDLE (MISCELLANEOUS) ×2 IMPLANT
DECANTER SPIKE VIAL GLASS SM (MISCELLANEOUS) ×2 IMPLANT
DERMABOND ADVANCED (GAUZE/BANDAGES/DRESSINGS) ×1
DERMABOND ADVANCED .7 DNX12 (GAUZE/BANDAGES/DRESSINGS) IMPLANT
DRAPE C-ARM 42X120 X-RAY (DRAPES) ×2 IMPLANT
DRAPE LAPAROTOMY TRNSV 102X78 (DRAPE) ×2 IMPLANT
DRAPE UTILITY XL STRL (DRAPES) ×2 IMPLANT
ELECT PENCIL ROCKER SW 15FT (MISCELLANEOUS) ×2 IMPLANT
ELECT REM PT RETURN 9FT ADLT (ELECTROSURGICAL) ×2
ELECTRODE REM PT RTRN 9FT ADLT (ELECTROSURGICAL) ×1 IMPLANT
GAUZE SPONGE 4X4 16PLY XRAY LF (GAUZE/BANDAGES/DRESSINGS) ×2 IMPLANT
GLOVE BIO SURGEON STRL SZ 6 (GLOVE) ×2 IMPLANT
GLOVE INDICATOR 6.5 STRL GRN (GLOVE) ×2 IMPLANT
GOWN STRL REUS W/TWL 2XL LVL3 (GOWN DISPOSABLE) ×2 IMPLANT
GOWN STRL REUS W/TWL XL LVL3 (GOWN DISPOSABLE) ×2 IMPLANT
KIT BASIN OR (CUSTOM PROCEDURE TRAY) ×2 IMPLANT
KIT PORT POWER 8FR ISP CVUE (Catheter) ×1 IMPLANT
LIQUID BAND (GAUZE/BANDAGES/DRESSINGS) ×2 IMPLANT
NEEDLE HYPO 22GX1.5 SAFETY (NEEDLE) ×2 IMPLANT
PACK BASIC VI WITH GOWN DISP (CUSTOM PROCEDURE TRAY) ×2 IMPLANT
SUT MNCRL AB 4-0 PS2 18 (SUTURE) ×2 IMPLANT
SUT PROLENE 2 0 SH DA (SUTURE) ×4 IMPLANT
SUT VIC AB 3-0 SH 27 (SUTURE) ×2
SUT VIC AB 3-0 SH 27X BRD (SUTURE) ×1 IMPLANT
SYR CONTROL 10ML LL (SYRINGE) ×2 IMPLANT
SYRINGE 10CC LL (SYRINGE) ×2 IMPLANT
TOWEL OR 17X26 10 PK STRL BLUE (TOWEL DISPOSABLE) ×2 IMPLANT
TOWEL OR NON WOVEN STRL DISP B (DISPOSABLE) ×2 IMPLANT
YANKAUER SUCT BULB TIP 10FT TU (MISCELLANEOUS) IMPLANT

## 2015-07-11 NOTE — Transfer of Care (Signed)
Immediate Anesthesia Transfer of Care Note  Patient: [name]  Procedure(s) Performed: Procedure(s): INSERTION PORT-A-CATH WITH ULTRA SOUND GUIDANCE (N/A)  Patient Location: PACU  Anesthesia Type:MAC  Level of Consciousness:  sedated, patient cooperative and responds to stimulation  Airway & Oxygen Therapy:Patient Spontanous Breathing and Patient connected to face mask oxgen  Post-op Assessment:  Report given to PACU RN and Post -op Vital signs reviewed and stable  Post vital signs:  Reviewed and stable  Last Vitals:  Filed Vitals:   07/11/15 0903  BP: 142/69  Pulse: 93  Temp: 36.7 C  Resp: 18    Complications: No apparent anesthesia complications

## 2015-07-11 NOTE — Telephone Encounter (Signed)
New message      Pt is on his way to surgery for a port a cath.  They do not have the preop presc for implant cardiac device form.  Please fax it to [phone] Attn Johnn Hai

## 2015-07-11 NOTE — H&P (Signed)
[name] 07/06/2015 11:09 AM Location: [facility] Patient #: [id number] DOB: 1935-05-05 Undefined / Language: [name] / Race: Black or African American Male   History of Present Illness Joseph Klein MD; 07/06/2015 11:32 AM) The patient is a 81 year old male who presents with gastric cancer. CC: Gastric cancer  Referring MD: Joseph Moros, MD  HISTORY: Patient is a 81 year old African-American male who developed profound anemia. Underwent upper endoscopy and was found to have a large gastric mass. This appeared to go from the lesser curve down the incisura and good through her to the pylorus. It was ulcerated and friable. The pylorus was able to be traversed. He has been very fatigued and getting short of breath. He has not had any nausea, vomiting, unexpected weight loss, heartburn, or abdominal pain. His bowel function has been the same.  He has a history of heart failure with an EF of 25-30%. He has an ICD placement and left bundle branch block. Additionally, he has COPD hypertension and diabetes mellitus. He is not on anticoagulants other than baby aspirin. He did require transfusion with this anemia.  Past Medical History Diagnosis Date . Asthma . COPD (chronic obstructive pulmonary disease) (HCC) FEV1 2.68/72%, FEV1/FVC 0.63 (08/22/04 . Colon polyps . Hyperlipidemia . HTN (hypertension) . LBBB (left bundle branch block) [name] 06/20/2014 . Prostate cancer ([place]) 2010 . Arthritis  . Non-ischemic cardiomyopathy ([place]) a. s/p STJ CRTD 07/2014 . Chronic systolic heart failure ([place]) . AICD (automatic cardioverter/defibrillator) present . Shortness of breath dyspnea . Anemia . History of blood transfusion . Pneumonia   Past Surgical History Procedure Laterality Date . Robot assisted laparoscopic radical prostatectomy 2010 . Cataract extraction w/ intraocular lens implant, bilateral Bilateral 2010 . Left heart catheterization with coronary angiogram N/A  01/04/2014 no obstructive CAD . Bi-ventricular implantable cardioverter defibrillator N/A 07/17/2014 STJ CRTD implanted by Dr [name] . Prostatectomy . Colonoscopy . Esophagogastroduodenoscopy (egd) with propofol N/A 06/27/2015 Procedure: ESOPHAGOGASTRODUODENOSCOPY (EGD) WITH PROPOFOL; Surgeon: Joseph Gunning, MD; Location: [facility]; Service: Gastroenterology; Laterality: N/A;   Current Outpatient Prescriptions Medication Sig Dispense Refill . acetaminophen (TYLENOL) 500 MG tablet Take 1,000 mg by mouth daily as needed for fever or headache. Marland Kitchen amLODipine (NORVASC) 10 MG tablet Take 1 tablet (10 mg total) by mouth daily. 90 tablet 3 . aspirin 81 MG EC tablet Take 81 mg by mouth daily.  Marland Kitchen atorvastatin (LIPITOR) 40 MG tablet Take 1 tablet (40 mg total) by mouth daily. 90 tablet 3 . carvedilol (COREG) 3.125 MG tablet TAKE 1 TABLET(3.125 MG) BY MOUTH TWICE DAILY WITH A MEAL 60 tablet 3 . DULERA 100-5 MCG/ACT AERO INHALE 2 PUFFS VIA SPACER TWICE DAILY( THEN RINSE MOUTH) (Patient taking differently: INHALE 2 PUFFS VIA SPACER once DAILY( THEN RINSE MOUTH)) 39 g 1 . Fe Cbn-Fe Gluc-FA-B12-C-DSS (FERRALET 90) 90-1 MG TABS Take 1 tablet by mouth daily. (Patient taking differently: Take 1 tablet by mouth 2 (two) times daily. ) 30 each 11 . folic acid (FOLVITE) 562 MCG tablet Take 400 mcg by mouth every evening.  . Multiple Vitamins-Minerals (MULTIVITAMIN,TX-MINERALS) tablet Take 1 tablet by mouth daily.  Marland Kitchen omeprazole (PRILOSEC) 20 MG capsule Take 1 capsule (20 mg total) by mouth 2 (two) times daily before a meal. 30 capsule 3 . Polyethyl Glycol-Propyl Glycol (SYSTANE OP) Place 1 drop into both eyes daily. . theophylline (UNIPHYL) 400 MG 24 hr tablet Take 200 mg by mouth 2 (two) times daily. Patient takes 1/2 tablet ( 200 mg ) by mouth two times daily .  valsartan (DIOVAN) 320 MG tablet Take 1 tablet (320 mg total) by mouth daily. 90 tablet 3 . vitamin E 400 UNIT capsule Take 400 Units by  mouth daily.   No current facility-administered medications for this visit.    No Known Allergies   Family History Problem Relation Age of Onset . Arthritis Other . Esophagitis Mother STRICTURE . Prostate cancer Father . Heart attack Neg Hx . Stroke Mother . Hypertension Mother . Hypertension Father    Social History  Social History . Marital Status: Married Spouse Name: N/A . Number of Children: N/A . Years of Education: N/A  Social History Main Topics . Smoking status: Former Smoker -- 1.50 packs/day for 40 years Types: Cigarettes Quit date: 05/05/2002 . Smokeless tobacco: Never Used . Alcohol Use: No . Drug Use: No . Sexual Activity: Not Currently  Other Topics Concern . Not on file  Social History Narrative Regular Exercise - NO Married, wife [name] #2 sons-[name] and [name]     REVIEW OF SYSTEMS - PERTINENT POSITIVES ONLY: 12 point review of systems negative other than HPI and PMH  LABORATORY RESULTS: Available labs are reviewed  Comprehensive metabolic panel Status: Abnormal Collection Time: 06/04/15 9:04 AM Result Value Ref Range Sodium 140 135 - 145 mEq/L Potassium 4.1 3.5 - 5.1 mEq/L Chloride 107 96 - 112 mEq/L CO2 25 19 - 32 mEq/L Glucose, Bld 101 (H) 70 - 99 mg/dL BUN 15 6 - 23 mg/dL Creatinine, Ser 1.01 0.40 - 1.50 mg/dL Total Bilirubin 0.5 0.2 - 1.2 mg/dL Alkaline Phosphatase 103 39 - 117 U/L AST 18 0 - 37 U/L ALT 9 0 - 53 U/L Total Protein 6.9 6.0 - 8.3 g/dL Albumin 3.6 3.5 - 5.2 g/dL Calcium 8.9 8.4 - 10.5 mg/dL GFR 91.84 >60.00 mL/min CBC with Differential/Platelet Status: Abnormal Collection Time: 06/04/15 9:04 AM Result Value Ref Range WBC 5.1 4.0 - 10.5 K/uL RBC 4.42 4.22 - 5.81 Mil/uL Hemoglobin 6.5 cL (LL) 13.0 - 17.0 g/dL HCT 23.5 aL (LL) 39.0 - 52.0 % MCV 53.2 aL (L) 78.0 - 100.0 fl MCHC 27.7 aL (L) 30.0 - 36.0 g/dL RDW 23.2 (H) 11.5 - 15.5 % Platelets 417.0 Giant Platelets seen on smear. (H)  150.0 - 400.0 K/uL Neutrophils Relative % 66.0 43.0 - 77.0 % Lymphocytes Relative 16.0 12.0 - 46.0 % Monocytes Relative 14.0 (H) 3.0 - 12.0 % Eosinophils Relative 3.0 0.0 - 5.0 % Basophils Relative 1.0 0.0 - 3.0 % Hypochromia Presence of (A) None Poikilocytes Presence of (A) None Target Cells Presence of (A) None IBC panel Status: Abnormal Collection Time: 06/04/15 9:04 AM Result Value Ref Range Iron 17 (L) 42 - 165 ug/dL Transferrin 285.0 212.0 - 360.0 mg/dL Saturation Ratios 4.3 (L) 20.0 - 50.0 % Urinalysis, Routine w reflex microscopic (not at [facility]) Status: None Collection Time: 06/04/15 9:04 AM Result Value Ref Range Color, Urine YELLOW Yellow;Lt. Yellow APPearance CLEAR Clear Specific Gravity, Urine 1.020 1.000-1.030 pH 6.0 5.0 - 8.0 Total Protein, Urine NEGATIVE Negative Urine Glucose NEGATIVE Negative Ketones, ur NEGATIVE Negative Bilirubin Urine NEGATIVE Negative Hgb urine dipstick NEGATIVE Negative Urobilinogen, UA 0.2 0.0 - 1.0 Leukocytes, UA NEGATIVE Negative Nitrite NEGATIVE Negative WBC, UA none seen 0-2/hpf RBC / HPF none seen 0-2/hpf TSH Status: None Collection Time: 06/04/15 9:04 AM Result Value Ref Range TSH 2.58 0.35 - 4.50 uIU/mL Hemoglobin A1c Status: None Collection Time: 06/04/15 9:04 AM Result Value Ref Range Hgb A1c MFr Bld 6.4 4.6 - 6.5 % Comment: Glycemic Control Guidelines for People with Diabetes:Non  Diabetic: <6%Goal of Therapy: <7%Additional Action Suggested: >8% Microalbumin / creatinine urine ratio Status: None Collection Time: 06/04/15 9:04 AM Result Value Ref Range Microalb, Ur 1.7 0.0 - 1.9 mg/dL Creatinine,U 134.5 mg/dL Microalb Creat Ratio 1.3 0.0 - 30.0 mg/g Vitamin B12 Status: None Collection Time: 06/04/15 9:04 AM Result Value Ref Range Vitamin B-12 448 211 - 911 pg/mL Ferritin Status: Abnormal Collection Time: 06/04/15 9:04 AM Result Value Ref Range Ferritin 7.9 (L) 22.0 - 322.0  ng/mL Folate Status: None Collection Time: 06/04/15 9:04 AM Result Value Ref Range Folate >23.6 >5.9 ng/mL Troponin I Status: None Collection Time: 06/04/15 9:04 AM Result Value Ref Range TNIDX 0.02 0.00 - 0.06 ug/l D-dimer, quantitative (not at [facility]) Status: None Collection Time: 06/04/15 9:04 AM Result Value Ref Range D-Dimer, Quant 0.39 0.00 - 0.48 ug/mL-FEU Comment: At the inhouse established cutoff value of 0.48 ug/mL FEU, this methology has been documented in the literature to have a sensitivity and negative predictive value of at least 98-99%. The test result should be correlated with an assessment of the clinical probability of DVT/VTE. Brain natriuretic peptide Status: Abnormal Collection Time: 06/04/15 9:04 AM Result Value Ref Range Pro B Natriuretic peptide (BNP) 765.0 (H) 0.0 - 100.0 pg/mL Hemoccult Cards (X3 cards) Status: Abnormal Collection Time: 06/14/15 12:51 PM Result Value Ref Range OCCULT 1 Positive (A) Negative OCCULT 2 Positive (A) Negative OCCULT 3 Positive (A) Negative OCCULT 4 Positive (A) Negative OCCULT 5 Positive (A) Negative Fecal Occult Blood Positive (A) Negative CBC with Differential/Platelet Status: Abnormal Collection Time: 06/18/15 2:46 PM Result Value Ref Range WBC 6.2 4.0 - 10.5 K/uL Comment: manual diff=72seg,17lymph,21moo,2eos RBC 4.82 4.22 - 5.81 Mil/uL Hemoglobin 8.1 Repeated and verified X2. (L) 13.0 - 17.0 g/dL HCT 28.2 (L) 39.0 - 52.0 % MCV 58.5 Repeated and verified X2. (L) 78.0 - 100.0 fl MCHC 28.9 (L) 30.0 - 36.0 g/dL RDW 33.0 (H) 11.5 - 15.5 % Platelets 369.0 150.0 - 400.0 K/uL Comment: Giant Platelets seen on smear. Hypochromia Presence of (A) None Microcytosis Presence of (A) None Polychromasia Presence of (A) None Target Cells Presence of (A) None I-STAT 4, (NA,K, GLUC, HGB,HCT) Status: Abnormal Collection Time: 06/27/15 8:38 AM Result Value Ref Range Sodium 140 135 - 145  mmol/L Potassium 4.0 3.5 - 5.1 mmol/L Glucose, Bld 106 (H) 65 - 99 mg/dL HCT 31.0 (L) 39.0 - 52.0 % Hemoglobin 10.5 (L) 13.0 - 17.0 g/dL    RADIOLOGY RESULTS: Images and reports are reviewed.  Ct Chest W Contrast  06/28/2015 CLINICAL DATA: Gastric mass identified on endoscopy yesterday. History of prostate cancer. EXAM: CT CHEST, ABDOMEN, AND PELVIS WITH CONTRAST TECHNIQUE: Multidetector CT imaging of the chest, abdomen and pelvis was performed following the standard protocol during bolus administration of intravenous contrast. CONTRAST: 1030mOMNIPAQUE IOHEXOL 300 MG/ML SOLN COMPARISON: None. FINDINGS: CT CHEST FINDINGS Mediastinum/Lymph Nodes: No chest wall mass, supraclavicular or axillary lymphadenopathy. A permanent left-sided pacemaker is noted. The heart is normal in size. No pericardial effusion. The pacer wires are in good position. No complicating features. The aorta is normal in caliber. No dissection. Scattered atherosclerotic calcifications at the aortic arch. The branch vessels are patent. Stable coronary artery calcifications. There are borderline mediastinal and hilar lymph nodes. 10.5 mm pretracheal node on image number 27. 27.5 mm aorticopulmonary window lymph nodes on image number 28. 17.5 mm right hilar lymph node on image number 32. 14.5 mm subcarinal node on image number 34. The esophagus is grossly normal. There is a distal esophageal diverticulum noted.  Lungs/Pleura: There are moderate emphysematous changes and pulmonary scarring. Small ill-defined right upper lobe nodular densities noted on image number 18. 3.5 mm right middle lobe nodule near the major fissure on image number 34. 7.5 mm nodule in the right lower lobe on image number 52. Small cluster of airspace nodules in the left lower lobe on image number 30 7 May be inflammatory. Ill-defined ground-glass opacity in the left upper lobe on image number 31 measures 20 mm. A few other small scattered sub 3 mm pulmonary  nodules are noted both lungs. Could not exclude metastatic disease. Close CT follow-up suggested. No acute pulmonary findings. No pleural effusion. Musculoskeletal: No significant bony findings. CT ABDOMEN PELVIS FINDINGS Hepatobiliary: Small area of hyper enhancement in segment 4A on image number 62. This is most likely a small vascular shunt but could not exclude the possibility of a hypervascular metastasis. It measures a maximum of 11 mm on image number 62. No other liver lesions are identified. The gallbladder is normal except for a possible small gallstone. No common bile duct dilatation. Pancreas: No mass, inflammation or ductal dilatation. Spleen: Normal size. No focal lesions. Adrenals/Urinary Tract: The left adrenal gland is moderately thickened but I do not see a discrete lesion. There is a simple appearing right renal cyst. No worrisome renal lesions or hydronephrosis. Suspect small renal calculi. Stomach/Bowel: There is a large infiltrating circumferential mass involving the distal body and antral region of the stomach along the lesser curvature. It measured is approximately 6.9 x 4.4 cm. There is adjacent lymphadenopathy in the gastrohepatic ligament which measures approximately 3.0 x 2.4 cm. The there are also a small no it is just anterior to the stomach on image number 80. They celiac axis lymph node measures 9 mm on image number 82. The duodenum bulb and C-loop are normal. The small bowel and colon are unremarkable. Moderate sigmoid diverticulosis but no findings for acute diverticulitis. The terminal ileum is normal. The appendix is normal. Vascular/Lymphatic: The aorta is normal in caliber. Scattered atherosclerotic calcifications. The branch vessels are patent. The major venous structures are patent. Lymph nodes as described above. Reproductive: The bladder is unremarkable. Status post prostatectomy. Other: No pelvic mass or adenopathy. No free pelvic fluid collections. Small right inguinal  hernia containing a small amount of fluid. No inguinal adenopathy. Musculoskeletal: No significant bony findings. No definite metastatic disease. IMPRESSION: 1. Large circumferential infiltrating gastric mass involving the distal body and antrum. There is adjacent adenopathy as discussed above. 2. Single enhancing 11 mm liver lesion is most likely a small vascular shunt. 3. Multiple pulmonary lesions as discussed above. There is also borderline adenopathy in the chest. 4. PET-CT may be helpful for staging purposes and evaluation of the above lesions. 5. Emphysematous changes in the lungs and pulmonary scarring. 6. Distal esophageal diverticulum. Electronically Signed By: Marijo Sanes M.D. On: 06/28/2015 17:27  Ct Abdomen Pelvis W Contrast  06/28/2015 CLINICAL DATA: Gastric mass identified on endoscopy yesterday. History of prostate cancer. EXAM: CT CHEST, ABDOMEN, AND PELVIS WITH CONTRAST TECHNIQUE: Multidetector CT imaging of the chest, abdomen and pelvis was performed following the standard protocol during bolus administration of intravenous contrast. CONTRAST: 155m OMNIPAQUE IOHEXOL 300 MG/ML SOLN COMPARISON: None. FINDINGS: CT CHEST FINDINGS Mediastinum/Lymph Nodes: No chest wall mass, supraclavicular or axillary lymphadenopathy. A permanent left-sided pacemaker is noted. The heart is normal in size. No pericardial effusion. The pacer wires are in good position. No complicating features. The aorta is normal in caliber. No dissection. Scattered atherosclerotic calcifications  at the aortic arch. The branch vessels are patent. Stable coronary artery calcifications. There are borderline mediastinal and hilar lymph nodes. 10.5 mm pretracheal node on image number 27. 27.5 mm aorticopulmonary window lymph nodes on image number 28. 17.5 mm right hilar lymph node on image number 32. 14.5 mm subcarinal node on image number 34. The esophagus is grossly normal. There is a distal esophageal diverticulum noted.  Lungs/Pleura: There are moderate emphysematous changes and pulmonary scarring. Small ill-defined right upper lobe nodular densities noted on image number 18. 3.5 mm right middle lobe nodule near the major fissure on image number 34. 7.5 mm nodule in the right lower lobe on image number 52. Small cluster of airspace nodules in the left lower lobe on image number 30 7 May be inflammatory. Ill-defined ground-glass opacity in the left upper lobe on image number 31 measures 20 mm. A few other small scattered sub 3 mm pulmonary nodules are noted both lungs. Could not exclude metastatic disease. Close CT follow-up suggested. No acute pulmonary findings. No pleural effusion. Musculoskeletal: No significant bony findings. CT ABDOMEN PELVIS FINDINGS Hepatobiliary: Small area of hyper enhancement in segment 4A on image number 62. This is most likely a small vascular shunt but could not exclude the possibility of a hypervascular metastasis. It measures a maximum of 11 mm on image number 62. No other liver lesions are identified. The gallbladder is normal except for a possible small gallstone. No common bile duct dilatation. Pancreas: No mass, inflammation or ductal dilatation. Spleen: Normal size. No focal lesions. Adrenals/Urinary Tract: The left adrenal gland is moderately thickened but I do not see a discrete lesion. There is a simple appearing right renal cyst. No worrisome renal lesions or hydronephrosis. Suspect small renal calculi. Stomach/Bowel: There is a large infiltrating circumferential mass involving the distal body and antral region of the stomach along the lesser curvature. It measured is approximately 6.9 x 4.4 cm. There is adjacent lymphadenopathy in the gastrohepatic ligament which measures approximately 3.0 x 2.4 cm. The there are also a small no it is just anterior to the stomach on image number 80. They celiac axis lymph node measures 9 mm on image number 82. The duodenum bulb and C-loop are normal. The  small bowel and colon are unremarkable. Moderate sigmoid diverticulosis but no findings for acute diverticulitis. The terminal ileum is normal. The appendix is normal. Vascular/Lymphatic: The aorta is normal in caliber. Scattered atherosclerotic calcifications. The branch vessels are patent. The major venous structures are patent. Lymph nodes as described above. Reproductive: The bladder is unremarkable. Status post prostatectomy. Other: No pelvic mass or adenopathy. No free pelvic fluid collections. Small right inguinal hernia containing a small amount of fluid. No inguinal adenopathy. Musculoskeletal: No significant bony findings. No definite metastatic disease. IMPRESSION: 1. Large circumferential infiltrating gastric mass involving the distal body and antrum. There is adjacent adenopathy as discussed above. 2. Single enhancing 11 mm liver lesion is most likely a small vascular shunt. 3. Multiple pulmonary lesions as discussed above. There is also borderline adenopathy in the chest. 4. PET-CT may be helpful for staging purposes and evaluation of the above lesions. 5. Emphysematous changes in the lungs and pulmonary scarring. 6. Distal esophageal diverticulum. Electronically Signed By: Marijo Sanes M.D. 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 There are several perihilar lymph nodes that are suspicious. If he does have metastatic disease, he will not ever be a candidate for partial  gastrectomy. If he does not have any metastatic disease, then we would discuss in several months whether he desires to proceed with surgery. He is 52 with numerous medical problems. I will need to get risk stratification from his cardiologist and pulmonologist.  I think that he would benefit from outpatient physical therapy to get stronger whether he is just having chemotherapy or working toward surgery. He should also start to feel better as his anemia improves.  I reviewed the risks of a Port-A-Cath placement. We will plan to do this next week.  His children and wife were present. 45 min spent in evaluation, examination, counseling, and coordination of care. >50% spent in counseling. Current Plans You are being scheduled for surgery - Our schedulers will call you.  You should hear from our office's scheduling department within 5 working days about the location, date, and time of surgery. We try to make accommodations for patient's preferences in scheduling surgery, but sometimes the OR schedule or the surgeon's schedule prevents [place] from making those accommodations.  If you have not heard from our office [phone]) in 5 working days, call the office and ask for your surgeon's nurse.  If you have other questions about your diagnosis, plan, or surgery, call the office and ask for your surgeon's nurse.  Pt Education - ccs port insertion education Pt Education - CCS Free Text Education/Instructions: discussed with patient and provided information.   Signed by Joseph Klein, MD (07/06/2015 12:05 PM)

## 2015-07-11 NOTE — Telephone Encounter (Signed)
Ruthe Mannan was denied. No alternatives given.

## 2015-07-11 NOTE — Discharge Instructions (Signed)
[facility] Office Phone Number [phone]   POST OP INSTRUCTIONS  Always review your discharge instruction sheet given to you by the facility where your surgery was performed.  IF YOU HAVE DISABILITY OR FAMILY LEAVE FORMS, YOU MUST BRING THEM TO THE OFFICE FOR PROCESSING.  DO NOT GIVE THEM TO YOUR DOCTOR.  1. A prescription for pain medication may be given to you upon discharge.  Take your pain medication as prescribed, if needed.  If narcotic pain medicine is not needed, then you may take acetaminophen (Tylenol) or ibuprofen (Advil) as needed. 2. Take your usually prescribed medications unless otherwise directed 3. If you need a refill on your pain medication, please contact your pharmacy.  They will contact our office to request authorization.  Prescriptions will not be filled after 5pm or on week-ends. 4. You should eat very light the first 24 hours after surgery, such as soup, crackers, pudding, etc.  Resume your normal diet the day after surgery 5. It is common to experience some constipation if taking pain medication after surgery.  Increasing fluid intake and taking a stool softener will usually help or prevent this problem from occurring.  A mild laxative (Milk of Magnesia or Miralax) should be taken according to package directions if there are no bowel movements after 48 hours. 6. You may shower in 48 hours.  The surgical glue will flake off in 2-3 weeks.   7. ACTIVITIES:  No strenuous activity or heavy lifting for 1 week.   a. You may drive when you no longer are taking prescription pain medication, you can comfortably wear a seatbelt, and you can safely maneuver your car and apply brakes. b. RETURN TO WORK:  __________n/a_______________ You should see your doctor in the office for a follow-up appointment approximately three-four weeks after your surgery.    WHEN TO CALL YOUR DOCTOR: 1. Fever over 101.0 2. Nausea and/or vomiting. 3. Extreme swelling or  bruising. 4. Continued bleeding from incision. 5. Increased pain, redness, or drainage from the incision.  The clinic staff is available to answer your questions during regular business hours.  Please don't hesitate to call and ask to speak to one of the nurses for clinical concerns.  If you have a medical emergency, go to the nearest emergency room or call 911.  A surgeon from [facility] is always on call at the hospital.  For further questions, please visit [email or web address]      

## 2015-07-11 NOTE — Anesthesia Postprocedure Evaluation (Signed)
Anesthesia Post Note  Patient: Audi T Simoneaux  Procedure(s) Performed: Procedure(s) (LRB): INSERTION PORT-A-CATH WITH ULTRA SOUND GUIDANCE (N/A)  Patient location during evaluation: PACU Anesthesia Type: MAC Level of consciousness: awake and alert Pain management: pain level controlled Vital Signs Assessment: post-procedure vital signs reviewed and stable Respiratory status: spontaneous breathing, nonlabored ventilation, respiratory function stable and patient connected to nasal cannula oxygen Cardiovascular status: stable and blood pressure returned to baseline Anesthetic complications: no Comments: ICD interrogated by [employer] Rep prior to departing PACU. Patient doing well.    Last Vitals:  Filed Vitals:   07/11/15 1415 07/11/15 1437  BP: 124/76 124/68  Pulse: 89 73  Temp: 36.2 C 36.3 C  Resp: 17 14    Last Pain:  Filed Vitals:   07/11/15 1439  PainSc: 0-No pain                 Catalina Gravel

## 2015-07-11 NOTE — Anesthesia Preprocedure Evaluation (Addendum)
Anesthesia Evaluation  Patient identified by MRN, date of birth, ID band Patient awake  General Assessment Comment:81 y.o. male with a hx of nonischemic cardiomyopathy, chronic systolic CHF, moderate pulmonary hypertension, LBBB, HTN, HL, COPD. EF has remained 25-30%.   Reviewed: Allergy & Precautions, NPO status , Patient's Chart, lab work & pertinent test results, reviewed documented beta blocker date and time   Airway Mallampati: II  TM Distance: >3 FB Neck ROM: Limited    Dental  (+) Teeth Intact, Dental Advisory Given   Pulmonary shortness of breath, asthma , COPD, former smoker,    Pulmonary exam normal breath sounds clear to auscultation       Cardiovascular hypertension, Pt. on medications and Pt. on home beta blockers +CHF and + DOE  Normal cardiovascular exam+ dysrhythmias (LBBB) + Cardiac Defibrillator  Rhythm:Regular Rate:Normal     Neuro/Psych  Neuromuscular disease    GI/Hepatic Neg liver ROS, GERD  Medicated,Gastric cancer    Endo/Other  negative endocrine ROS  Renal/GU negative Renal ROS   Prostate cancer s/p resection     Musculoskeletal  (+) Arthritis , Osteoarthritis,    Abdominal   Peds  Hematology  (+) Blood dyscrasia, anemia ,   Anesthesia Other Findings Day of surgery medications reviewed with the patient.  Reproductive/Obstetrics                            Anesthesia Physical Anesthesia Plan  ASA: IV  Anesthesia Plan: MAC   Post-op Pain Management:    Induction: Intravenous  Airway Management Planned: Nasal Cannula  Additional Equipment:   Intra-op Plan:   Post-operative Plan:   Informed Consent: I have reviewed the patients History and Physical, chart, labs and discussed the procedure including the risks, benefits and alternatives for the proposed anesthesia with the patient or authorized representative who has indicated his/her understanding and  acceptance.   Dental advisory given  Plan Discussed with: CRNA  Anesthesia Plan Comments: (Discussed risks/benefits/alternatives to MAC sedation including need for ventilatory support, hypotension, need for conversion to general anesthesia.  All patient questions answered.  Patient wished to proceed.  Backup GA)       Anesthesia Quick Evaluation

## 2015-07-11 NOTE — Telephone Encounter (Signed)
Device Probation officer contacted.  Industry rep to call [facility] Short [facility] to give information for device during procedure. Informed Vaughan Basta, RN that Industry will be contacting her.  She was grateful for call.

## 2015-07-11 NOTE — Op Note (Signed)
PREOPERATIVE DIAGNOSIS:  Gastric cancer     POSTOPERATIVE DIAGNOSIS:  Same     PROCEDURE: Right subclavian port placement, Bard ClearVue  Power Port, MRI safe, 8-French.      SURGEON:  Stark Klein, MD      ANESTHESIA:  MAC   FINDINGS:  Good venous return, easy flush, and tip of the catheter and   SVC 23 cm.      SPECIMEN:  None.      ESTIMATED BLOOD LOSS:  Minimal.      COMPLICATIONS:  None known.      PROCEDURE:  Pt was identified in the holding area and taken to   the operating room, where patient was placed supine on the operating room   table.  MAC anesthesia was induced.  Patient's arms were tucked and the upper   chest and neck were prepped and draped in sterile fashion.  Time-out was   performed according to the surgical safety check list.  When all was   correct, we continued.     Local anesthetic was administered at the angle between the heads of the SCM.  The ultrasound was used to visualize the IJ.  The vein was accessed with 1 pass of the needle, but the wore would not pass.  The vein was accessed again but the wire again would not thread.    Local anesthetic was administered over this   area at the angle of the clavicle.  The subclavian vein was accessed with 1 pass of the needle. There was good venous return and the wire passed easily with no ectopy.   Fluoroscopy was used to confirm that the wire was in the vena cava.      The patient was placed back level and the area for the pocket was anethetized   with local anesthetic.  A 3-cm transverse incision was made with a #15   blade.  Cautery was used to divide the subcutaneous tissues down to the   pectoralis muscle.  An [employer] retractor was used to elevate the skin   while a pocket was created on top of the pectoralis fascia.  The port   was placed into the pocket to confirm that it was of adequate size.  The   catheter was preattached to the port.  The port was then secured to the   pectoralis fascia with  four 2-0 Prolene sutures.  These were clamped and   not tied down yet.    The catheter was tunneled through to the wire exit   site.  The catheter was placed along the wire to determine what length it should be to be in the SVC.  The catheter was cut at 23 cm.  The tunneler sheath and dilator were passed over the wire and the dilator and wire were removed.  The catheter was advanced through the tunneler sheath and the tunneler sheath was pulled away.  Care was taken to keep the catheter in the tunneler sheath as this occurred. This was advanced and the tunneler sheath was removed.  There was good venous   return and easy flush of the catheter.  The Prolene sutures were tied   down to the pectoral fascia.  The skin was reapproximated using 3-0   Vicryl interrupted deep dermal sutures.    Fluoroscopy was used to re-confirm good position of the catheter.  The skin   was then closed using 4-0 Monocryl in a subcuticular fashion.  The port was flushed with concentrated  heparin flush as well.  The wounds were then cleaned, dried, and dressed with Dermabond.  The patient was awakened from anesthesia and taken to the PACU in stable condition.  Needle, sponge, and instrument counts were correct.               Stark Klein, MD

## 2015-07-11 NOTE — Telephone Encounter (Signed)
Initiated PA for [facility] thru [place]. Key: [id number] Submitted for review.  [place] (p) [phone]  (f) [phone]

## 2015-07-11 NOTE — Interval H&P Note (Signed)
History and Physical Interval Note:  07/11/2015 10:44 AM  [name]  has presented today for surgery, with the diagnosis of gastric cancer  The various methods of treatment have been discussed with the patient and family. After consideration of risks, benefits and other options for treatment, the patient has consented to  Procedure(s): INSERTION PORT-A-CATH WITH ULTRA SOUND GUIDANCE (N/A) as a surgical intervention .  The patient's history has been reviewed, patient examined, no change in status, stable for surgery.  I have reviewed the patient's chart and labs.  Questions were answered to the patient's satisfaction.     Kevis Qu

## 2015-07-12 ENCOUNTER — Other Ambulatory Visit: Payer: Self-pay | Admitting: *Deleted

## 2015-07-12 ENCOUNTER — Other Ambulatory Visit: Payer: Commercial Managed Care - HMO

## 2015-07-12 ENCOUNTER — Encounter: Payer: Self-pay | Admitting: *Deleted

## 2015-07-12 ENCOUNTER — Encounter (HOSPITAL_COMMUNITY)
Admission: RE | Admit: 2015-07-12 | Discharge: 2015-07-12 | Disposition: A | Discharge status: Home / Self Care | Payer: Commercial Managed Care - HMO | Source: Ambulatory Visit | Attending: Oncology | Admitting: Oncology

## 2015-07-12 DIAGNOSIS — C169 Malignant neoplasm of stomach, unspecified: Secondary | ICD-10-CM | POA: Insufficient documentation

## 2015-07-12 DIAGNOSIS — E785 Hyperlipidemia, unspecified: Secondary | ICD-10-CM | POA: Diagnosis not present

## 2015-07-12 DIAGNOSIS — I110 Hypertensive heart disease with heart failure: Secondary | ICD-10-CM | POA: Diagnosis not present

## 2015-07-12 DIAGNOSIS — I10 Essential (primary) hypertension: Secondary | ICD-10-CM | POA: Diagnosis not present

## 2015-07-12 DIAGNOSIS — J449 Chronic obstructive pulmonary disease, unspecified: Secondary | ICD-10-CM | POA: Diagnosis not present

## 2015-07-12 DIAGNOSIS — J45909 Unspecified asthma, uncomplicated: Secondary | ICD-10-CM | POA: Diagnosis not present

## 2015-07-12 DIAGNOSIS — C162 Malignant neoplasm of body of stomach: Secondary | ICD-10-CM

## 2015-07-12 DIAGNOSIS — I447 Left bundle-branch block, unspecified: Secondary | ICD-10-CM | POA: Diagnosis not present

## 2015-07-12 DIAGNOSIS — D649 Anemia, unspecified: Secondary | ICD-10-CM | POA: Diagnosis not present

## 2015-07-12 DIAGNOSIS — E119 Type 2 diabetes mellitus without complications: Secondary | ICD-10-CM | POA: Diagnosis not present

## 2015-07-12 HISTORY — DX: Malignant neoplasm of stomach, unspecified: C16.9

## 2015-07-12 LAB — GLUCOSE, CAPILLARY: GLUCOSE-CAPILLARY: 99 mg/dL (ref 65–99)

## 2015-07-12 MED ORDER — FLUDEOXYGLUCOSE F - 18 (FDG) INJECTION
10.6000 | Freq: Once | INTRAVENOUS | Status: AC | PRN
  Administered 2015-07-12: 10.6 via INTRAVENOUS

## 2015-07-12 NOTE — Progress Notes (Signed)
Spent time with patient and his wife in chemo class reviewing side effects Of FOLFOX.  See education flowsheet

## 2015-07-12 NOTE — Telephone Encounter (Signed)
Spoke with patient- does not have a Drug Formulary. The book that the patient brought in was information about Medicare and no information pertaining to medications.  Please advise Dr Annamaria Boots of any alternatives so that we may check with the pharmacy on coverage. Thanks.

## 2015-07-12 NOTE — Telephone Encounter (Signed)
Patient in lobby to speak to nurse

## 2015-07-12 NOTE — Telephone Encounter (Signed)
Alternatives to [facility]-     Breo 100,    Inhale 1 puff then rinse mouth, once daily  # 1, ref x 12                                          Symbicort 160   Inhale 2 puffs then rinse mouth, twice daily   # 1, ref x 12                                           Advair 250,   Inhale 1 puff then rinse mouth, twice daily     # 1, ref x 12

## 2015-07-12 NOTE — Telephone Encounter (Signed)
Called pharm and on hold for longer than 6 minutes  WCB

## 2015-07-12 NOTE — Telephone Encounter (Signed)
Spoke with pt, aware that we need the insurance formulary booklet to prescribe a covered alternative. Pt states that he cannot find his formulary at this time and will call back.  Will send to [facility] as FYI

## 2015-07-12 NOTE — Telephone Encounter (Signed)
Pt called back and has found his formulary - walked him through where to look in the book. Pt not understanding what he is looking for. Pt is going to bring insurance booklet by today while in town to be reviewed. Will wait for patient to come by today

## 2015-07-12 NOTE — Telephone Encounter (Signed)
We need to help him check his insurance formulary for alternatives to [facility]

## 2015-07-13 ENCOUNTER — Encounter: Payer: Self-pay | Admitting: *Deleted

## 2015-07-13 ENCOUNTER — Telehealth: Payer: Self-pay | Admitting: Oncology

## 2015-07-13 ENCOUNTER — Other Ambulatory Visit: Payer: Self-pay | Admitting: *Deleted

## 2015-07-13 ENCOUNTER — Other Ambulatory Visit (HOSPITAL_BASED_OUTPATIENT_CLINIC_OR_DEPARTMENT_OTHER): Payer: Commercial Managed Care - HMO

## 2015-07-13 ENCOUNTER — Ambulatory Visit (HOSPITAL_BASED_OUTPATIENT_CLINIC_OR_DEPARTMENT_OTHER): Payer: Commercial Managed Care - HMO | Admitting: Oncology

## 2015-07-13 VITALS — BP 145/61 | HR 94 | Temp 98.0°F | Resp 19 | Ht 73.0 in | Wt 222.5 lb

## 2015-07-13 DIAGNOSIS — C162 Malignant neoplasm of body of stomach: Secondary | ICD-10-CM

## 2015-07-13 DIAGNOSIS — Z8546 Personal history of malignant neoplasm of prostate: Secondary | ICD-10-CM

## 2015-07-13 DIAGNOSIS — C7972 Secondary malignant neoplasm of left adrenal gland: Secondary | ICD-10-CM

## 2015-07-13 DIAGNOSIS — D509 Iron deficiency anemia, unspecified: Secondary | ICD-10-CM

## 2015-07-13 NOTE — Progress Notes (Signed)
[place] OFFICE PROGRESS NOTE   Diagnosis: Gastric cancer  INTERVAL HISTORY:   [name] returns as scheduled. He has attended a chemotherapy teaching class. Dr. Barry Dienes placed a Port-A-Cath 07/11/2015. He is taking a combination iron tablet twice daily. No new complaint.  Objective:  Vital signs in last 24 hours:  Blood pressure 145/61, pulse 94, temperature 98 F (36.7 C), temperature source Oral, resp. rate 19, height '6\' 1"'$  (1.854 m), weight 222 lb 8 oz (100.925 kg), SpO2 94 %.   Resp: Lungs clear bilaterally Cardio: Regular rate and rhythm GI: No hepatosplenomegaly, no mass, nontender Vascular: Trace low leg edema bilaterally  Portacath/PICC-without erythema  Lab Results:  Lab Results  Component Value Date   WBC 5.6 07/11/2015   HGB 7.6* 07/11/2015   HCT 28.2* 07/11/2015   MCV 63.5* 07/11/2015   PLT 294 07/11/2015   NEUTROABS 3.5 07/10/2014    Lab Results  Component Value Date   NA 141 07/11/2015    No results found for: CEA1  Imaging:  Nm Pet Image Initial (pi) Skull Base To Thigh  07/12/2015  CLINICAL DATA:  Initial treatment strategy for gastric carcinoma. Personal history of prostate cancer. EXAM: NUCLEAR MEDICINE PET SKULL BASE TO THIGH TECHNIQUE: 10.6 MCi F-18 FDG was injected intravenously. Full-ring PET imaging was performed from the skull base to thigh after the radiotracer. CT data was obtained and used for attenuation correction and anatomic localization. FASTING BLOOD GLUCOSE:  Value: 99 mg/dl COMPARISON:  CT 06/28/2015 FINDINGS: NECK No hypermetabolic lymph nodes in the neck. CHEST There is a hypermetabolic RIGHT lower paratracheal lymph node with SUV max equal 4.7. Lymph node is mildly enlarged at 13 mm short axis (series 4, image 63). Subcarinal lymph node with SUV max equal 4.7. RIGHT hilar hypermetabolic adenopathy. There are multiple scattered pulmonary nodules. The more conspicuous nodules do not have associated metabolic  activities. For example the RIGHT lower lobe nodule measuring 7 mm in the azygos esophageal recess. LEFT upper lobe irregular nodule measuring 12 mm on image 36, series 6 also is not hypermetabolic. However there are 2 foci of relatively intense activity for size in less conspicuous subpleural nodules. Small pleural-based thickening in the RIGHT upper lobe in image 11, series 6 with SUV max equal 5.3. Second focal lesion in the LEFT lower lobe with mild ground-glass opacity measuring 12 mm on image 61, series 6 with SUV max 5.3. ABDOMEN/PELVIS Intense metabolic activity associated with the circumferential lesion the gastric antrum with SUV max equal 7.1. There is still hypermetabolic gastro hepatic ligament lymph nodes with equal metabolic activity. No hypermetabolic periportal lymph nodes. No focal hypermetabolic activity in the liver. There is hypermetabolic activity associated with the lesion in the LEFT suprarenal location. This is felt to represent the lateral limb of the adrenal gland with SUV max equal 5.1. There is thickening of this gland to 13 mm. SKELETON There is scattered foci of metabolic activity in the spine. No associated lesions on CT. This is felt to represent variable marrow metabolic activity. IMPRESSION: 1. Circumferential hypermetabolic mass in the gastric antrum consists with primary gastric carcinoma. 2. Local nodal metastasis to the gastrohepatic ligament. 3. No evidence of liver metastasis. 4. Concern for metastatic lesion in the lateral limb of the LEFT adrenal gland. 5. Hypermetabolic RIGHT paratracheal, subcarinal and RIGHT hilar lymph nodes are concerning for metastatic adenopathy to the thorax. 6. Two less conspicuous pulmonary lesions have moderate metabolic: 1 peripherally in the RIGHT upper lobe in 1 peripherally  in the LEFT lower lobe. These are indeterminate with differential including small foci of infection versus metastatic disease. The more conspicuous nodules in the lungs  not have associated metabolic activity. Recommend attention on follow-up. 7. Several foci of metabolic activity in the spine without CT lesion. Favor variable marrow metabolic activity. Electronically Signed   By: Suzy Bouchard M.D.   On: 07/12/2015 10:27   Dg Chest Port 1 View  07/11/2015  CLINICAL DATA:  Status post port placement EXAM: PORTABLE CHEST 1 VIEW COMPARISON:  None. FINDINGS: Cardiac shadow is enlarged. A pacing device is noted. The lungs are well aerated bilaterally. A new right chest wall port is seen in satisfactory position. No pneumothorax is noted. No bony abnormality is seen. IMPRESSION: No pneumothorax following chest port placement Electronically Signed   By: Inez Catalina M.D.   On: 07/11/2015 13:43   Dg C-arm 1-60 Min-no Report  07/11/2015  CLINICAL DATA: port a cath C-ARM 1-60 MINUTES Fluoroscopy was utilized by the requesting physician.  No radiographic interpretation.    Medications: I have reviewed the patient's current medications.  Assessment/Plan: 1. Gastric Cancer  Lesser curvature gastric body mass noted on endoscopy 06/27/2015, biopsy confirmed adenocarcinoma  Staging CT scans to 06/28/2015 with a gastric body mass, adenopathy adjacent to the stomach, chest adenopathy, a single enhancing liver lesion, and indeterminate nodular pulmonary lesions  PET scan 07/12/2015-hypermetabolic gastric mass, gastrohepatic ligament metastases, metastasis at the left adrenal gland, hypermetabolic right paratracheal, right hilar, and subcarinal nodes moderate metabolic activity involving 2 pulmonary lesions  2. COPD  3. Nonischemic cardiomyopathy  4. Implanted cardiac defibrillator  5. History of prostate cancer  6. Hypertension  7. Iron deficiency anemia    Disposition:  [name] has been diagnosed with gastric cancer. The PET scan is consistent with metastatic disease involving abdomen/chest lymph nodes, the left adrenal gland, and potentially  small lung nodules. I reviewed the PET images with the patient and his family. They understand he has stage IV gastric cancer and will not be a candidate for curative surgery. We discussed chemotherapy options. I recommend FOLFOX chemotherapy. The goal of chemotherapy is to delay disease progression, prevent gastric obstruction and bleeding, and potentially prolong survival.  We will consider palliative radiation if he develops bleeding or symptoms related to the gastric primary.  I reviewed the potential toxicities associated with the FOLFOX regimen including the chance for nausea/vomiting, mucositis, alopecia, diarrhea, and hematologic toxicity. We discussed the rash, sun sensitivity, hyperpigmentation, and hand/foot syndrome associated with 5-fluorouracil. We reviewed the allergic reaction and various types of neuropathy seen with oxaliplatin. He agrees to proceed. He has attended a chemotherapy teaching class.  He will discontinue the multi vitamin iron preparation and begin ferrous sulfate. We will check a CBC and baseline CEA when he returns for the first cycle of chemotherapy on 07/19/2015.  Approximately 40 minutes were spent with the patient today. The majority of the time was used for counseling and coordination of care.  Betsy Coder, MD  07/13/2015  11:37 AM

## 2015-07-13 NOTE — Progress Notes (Signed)
Oncology Nurse Navigator Documentation  Oncology Nurse Navigator Flowsheets 07/13/2015  Navigator Location [facility]  Navigator Encounter Type Follow-up Appt  Abnormal Finding Date -  Confirmed Diagnosis Date -  Patient Visit Type MedOnc  Treatment Phase Pre-Tx/Tx Discussion--FOLFOX  Barriers/Navigation Needs Education  Education Preparing for Upcoming  Treatment--typical treatment day; how to apply emla cream; changes in medications  Interventions Coordination of Care  Coordination of Care Other--pathology for Her 2 testing  Education Method Verbal;Written;Teach-back--see patient instructions  Support Groups/Services -  Acuity -  Time Spent with Patient 27  Family present at visit and disappointed that disease is in some lymph nodes and in an adrenal gland. MD explained how surgery is most likely not a option now. Will present case at GI Conference on 07/25/15. Made family aware that we will support their decision if they ever wish to obtain a 2nd opinion-medical or surgical. Email to pathology to request Her-2 testing on following case:  Patient: [name], [name] Collected: 06/27/2015 Client: [facility] Accession: [id number] Received: 06/27/2015 Beckett Ridge Cellar, MD DOB: 05-26-1934 Age: 81 Gender: M Reported: 06/28/2015 [address] Patient Ph: [phone] MRN #: [id number] [place], [place] [address] REPORT OF SURGICAL

## 2015-07-13 NOTE — Patient Instructions (Signed)
[facility] Discharge Instructions  RECOMMENDATIONS MADE BY THE CONSULTANT AND ANY TEST RESULTS WILL BE SENT TO YOUR REFERRING PHYSICIAN.  EXAM FINDINGS BY THE PHYSICIAN TODAY AND SIGNS OR SYMPTOMS TO REPORT TO CLINIC OR PRIMARY PHYSICIAN: PET scans show cancer is outside of stomach, so chemotherapy is best option at this time. Has chance of shrinking tumor by half (30-40% chance). May add radiation later if develops bleeding or tumor does not respond to chemo  MEDICATIONS PRESCRIBED:  Stop current iron and start ferrous sulfate 325 mg three times daily (OTC) Discontinue the folic acid, vitamin D, and multivitamin  INSTRUCTIONS GIVEN AND DISCUSSED: FOLFOX every 2 weeks = Oxaliplatin, 5Fluouracil, Leucovorin   SPECIAL INSTRUCTIONS/FOLLOW-UP Plan to begin chemo on 07/19/15  Thank you for choosing [facility] to provide your oncology and hematology care.  To afford each patient quality time with our providers, please arrive at least 30 minutes before your scheduled appointment time.  With your help, our goal is to use those 30 minutes to complete the necessary work-up to ensure our physicians have the information they need to help with your evaluation and healthcare recommendations.     ___________________  Should you have questions after your visit to [facility], please contact our office at [phone] between the hours of 8:30 a.m. and 4:30 p.m.  Voicemails left after 4:00 p.m. will not be returned until the following business day.  For prescription refill requests, have your pharmacy contact our office with your prescription refill request. We request 24 hour notice for all refill requests.

## 2015-07-13 NOTE — Telephone Encounter (Signed)
Gave and printed appt sched and avs for pt for March. °

## 2015-07-16 ENCOUNTER — Telehealth: Payer: Self-pay | Admitting: *Deleted

## 2015-07-16 NOTE — Telephone Encounter (Signed)
Oncology Nurse Navigator Documentation  Oncology Nurse Navigator Flowsheets 07/16/2015  Navigator Location [facility]  Navigator Encounter Type Telephone  Telephone Outgoing Call;Education  Abnormal Finding Date -  Confirmed Diagnosis Date -  Patient Visit Type -  Treatment Phase -  Barriers/Navigation Needs -  Education Other-Iron supplement  Interventions -Instructed wife to proceed with ferrous sulfate tid instead of his prior script, which has folic acid (MD wanted to stop) and less iron per tablet.  Coordination of Care -  Education Method Verbal;Teach-back  Support Groups/Services -  Acuity -  Time Spent with Patient 15

## 2015-07-17 ENCOUNTER — Encounter: Payer: Self-pay | Admitting: Internal Medicine

## 2015-07-17 ENCOUNTER — Encounter: Payer: Self-pay | Admitting: Physical Therapy

## 2015-07-17 ENCOUNTER — Ambulatory Visit (INDEPENDENT_AMBULATORY_CARE_PROVIDER_SITE_OTHER): Payer: Commercial Managed Care - HMO | Admitting: Internal Medicine

## 2015-07-17 ENCOUNTER — Ambulatory Visit: Payer: Commercial Managed Care - HMO | Admitting: Physical Therapy

## 2015-07-17 VITALS — BP 160/70 | HR 92 | Ht 74.0 in | Wt 222.8 lb

## 2015-07-17 DIAGNOSIS — R531 Weakness: Secondary | ICD-10-CM

## 2015-07-17 DIAGNOSIS — I5022 Chronic systolic (congestive) heart failure: Secondary | ICD-10-CM | POA: Diagnosis not present

## 2015-07-17 DIAGNOSIS — R269 Unspecified abnormalities of gait and mobility: Secondary | ICD-10-CM

## 2015-07-17 LAB — CUP PACEART INCLINIC DEVICE CHECK
Battery Remaining Longevity: 48 mo
Brady Statistic RV Percent Paced: 75 %
Date Time Interrogation Session: 20170314085749
HIGH POWER IMPEDANCE MEASURED VALUE: 57 Ohm
Implantable Lead Implant Date: 20160314
Implantable Lead Implant Date: 20160314
Implantable Lead Model: 7122
Lead Channel Impedance Value: 430 Ohm
Lead Channel Pacing Threshold Amplitude: 1 V
Lead Channel Pacing Threshold Pulse Width: 0.7 ms
Lead Channel Sensing Intrinsic Amplitude: 12 mV
Lead Channel Sensing Intrinsic Amplitude: 4.5 mV
Lead Channel Setting Pacing Amplitude: 2 V
Lead Channel Setting Pacing Amplitude: 3 V
Lead Channel Setting Sensing Sensitivity: 0.5 mV
MDC IDC LEAD IMPLANT DT: 20160314
MDC IDC LEAD LOCATION: 753858
MDC IDC LEAD LOCATION: 753859
MDC IDC LEAD LOCATION: 753860
MDC IDC MSMT BATTERY REMAINING PERCENTAGE: 80 %
MDC IDC MSMT LEADCHNL LV PACING THRESHOLD AMPLITUDE: 1.5 V
MDC IDC MSMT LEADCHNL RA IMPEDANCE VALUE: 400 Ohm
MDC IDC MSMT LEADCHNL RA PACING THRESHOLD PULSEWIDTH: 0.5 ms
MDC IDC MSMT LEADCHNL RV IMPEDANCE VALUE: 430 Ohm
MDC IDC MSMT LEADCHNL RV PACING THRESHOLD AMPLITUDE: 0.5 V
MDC IDC MSMT LEADCHNL RV PACING THRESHOLD PULSEWIDTH: 0.5 ms
MDC IDC SET LEADCHNL LV PACING PULSEWIDTH: 0.6 ms
MDC IDC SET LEADCHNL RV PACING AMPLITUDE: 2 V
MDC IDC SET LEADCHNL RV PACING PULSEWIDTH: 0.5 ms
MDC IDC STAT BRADY RA PERCENT PACED: 25 %
Pulse Gen Serial Number: 7239374

## 2015-07-17 NOTE — Patient Instructions (Signed)
Medication Instructions:  Your physician recommends that you continue on your current medications as directed. Please refer to the Current Medication list given to you today.   Labwork: None ordered   Testing/Procedures: None ordered   Follow-Up: Your physician wants you to follow-up in: 12 months with Dr Knox Saliva will receive a reminder letter in the mail two months in advance. If you don't receive a letter, please call our office to schedule the follow-up appointment.   Remote monitoring is used to monitor your  ICD from home. This monitoring reduces the number of office visits required to check your device to one time per year. It allows [place] to keep an eye on the functioning of your device to ensure it is working properly. You are scheduled for a device check from home on 10/16/15. You may send your transmission at any time that day. If you have a wireless device, the transmission will be sent automatically. After your physician reviews your transmission, you will receive a postcard with your next transmission date.    Any Other Special Instructions Will Be Listed Below (If Applicable).     If you need a refill on your cardiac medications before your next appointment, please call your pharmacy.

## 2015-07-17 NOTE — Progress Notes (Signed)
HPI [name] returns today for ongoing evaluation of chronic systolic heart failure in the setting of LBBB, s/p insertion of a St. Jude Biv ICD. He notes only a minimal improvement in his CHF symptoms. No syncope. He admits to sodium indiscretion.ln the interim, he has been diagnosed with gastric CA and is pending Chemo therapy and possible future surgery. His CHF symptoms remain class 2.  No Known Allergies   Current Outpatient Prescriptions  Medication Sig Dispense Refill  . acetaminophen (TYLENOL) 500 MG tablet Take 1,000 mg by mouth daily as needed for fever or headache.    Marland Kitchen amLODipine (NORVASC) 10 MG tablet Take 1 tablet (10 mg total) by mouth daily. 90 tablet 3  . aspirin 81 MG EC tablet Take 81 mg by mouth daily.      Marland Kitchen atorvastatin (LIPITOR) 40 MG tablet Take 1 tablet (40 mg total) by mouth daily. 90 tablet 3  . carvedilol (COREG) 3.125 MG tablet TAKE 1 TABLET(3.125 MG) BY MOUTH TWICE DAILY WITH A MEAL 60 tablet 3  . Fe Cbn-Fe Gluc-FA-B12-C-DSS (FERRALET 90) 90-1 MG TABS Take 1 tablet by mouth daily.  11  . HYDROcodone-acetaminophen (NORCO/VICODIN) 5-325 MG tablet Take 1-2 tablets by mouth every 4 (four) hours as needed for moderate pain or severe pain. 20 tablet 0  . lidocaine-prilocaine (EMLA) cream Apply 1 application topically as needed. Apply to [facility] 1 hour prior to stick and cover w/plastic wrap 30 g 6  . mometasone-formoterol (DULERA) 100-5 MCG/ACT AERO Inhale 2 puffs into the lungs daily.    Marland Kitchen omeprazole (PRILOSEC) 20 MG capsule Take 1 capsule (20 mg total) by mouth 2 (two) times daily before a meal. 30 capsule 3  . Polyethyl Glycol-Propyl Glycol (SYSTANE OP) Place 1 drop into both eyes daily.     . prochlorperazine (COMPAZINE) 10 MG tablet Take 1 tablet (10 mg total) by mouth every 6 (six) hours as needed for nausea. 60 tablet 1  . theophylline (UNIPHYL) 400 MG 24 hr tablet Take 200 mg by mouth 2 (two) times daily. Patient takes 1/2 tablet ( 200 mg ) by mouth two  times daily    . valsartan (DIOVAN) 320 MG tablet Take 1 tablet (320 mg total) by mouth daily. 90 tablet 3   No current facility-administered medications for this visit.     Past Medical History  Diagnosis Date  . Asthma   . COPD (chronic obstructive pulmonary disease) (HCC)     FEV1 2.68/72%, FEV1/FVC 0.63 (08/22/04  . Colon polyps   . Hyperlipidemia   . HTN (hypertension)   . LBBB (left bundle branch block)     [name] 06/20/2014  . Arthritis        . Non-ischemic cardiomyopathy ([place])     a. s/p STJ CRTD 07/2014  . Chronic systolic heart failure ([place])   . AICD (automatic cardioverter/defibrillator) present   . Shortness of breath dyspnea   . Anemia   . History of blood transfusion   . Pneumonia   . Prostate cancer ([place]) 2010  . Malignant neoplasm pyloric antrum (HCC)     ROS:   All systems reviewed and negative except as noted in the HPI.   Past Surgical History  Procedure Laterality Date  . Robot assisted laparoscopic radical prostatectomy  2010  . Cataract extraction w/ intraocular lens  implant, bilateral Bilateral 2010  . Left heart catheterization with coronary angiogram N/A 01/04/2014    no obstructive CAD  . Bi-ventricular implantable cardioverter defibrillator  N/A 07/17/2014    STJ CRTD implanted by Dr [name]  . Prostatectomy    . Colonoscopy    . Esophagogastroduodenoscopy (egd) with propofol N/A 06/27/2015    Procedure: ESOPHAGOGASTRODUODENOSCOPY (EGD) WITH PROPOFOL;  Surgeon: Joseph Gunning, MD;  Location: [place];  Service: Gastroenterology;  Laterality: N/A;  . Portacath placement N/A 07/11/2015    Procedure: INSERTION PORT-A-CATH WITH ULTRA SOUND GUIDANCE;  Surgeon: Joseph Klein, MD;  [facility];  Service: General;  Laterality: N/A;     Family History  Problem Relation Age of Onset  . Arthritis Other   . Esophagitis Mother     STRICTURE  . Prostate cancer Father   . Heart attack Neg Hx   . Stroke Mother   . Hypertension Mother   .  Hypertension Father      Social History   Social History  . Marital Status: Married    Spouse Name: [name]  . Number of Children: 2  . Years of Education: N/A   Occupational History  . Retired    Social History Main Topics  . Smoking status: Former Smoker -- 1.50 packs/day for 40 years    Types: Cigarettes    Quit date: 05/05/2002  . Smokeless tobacco: Never Used  . Alcohol Use: No  . Drug Use: No  . Sexual Activity: Not Currently   Other Topics Concern  . Not on file   Social History Narrative   Regular Exercise -  NO   Married, wife [name]   #2 sons-[name] and [name]        BP 160/70 mmHg  Pulse 92  Ht '6\' 2"'$  (1.88 m)  Wt 222 lb 12.8 oz (101.061 kg)  BMI 28.59 kg/m2  Physical Exam:  Well appearing 81 yo man, NAD HEENT: Unremarkable Neck:  7 cm JVD, no thyromegally Back:  No CVA tenderness Lungs:  Clear with no wheezes, rales or rhonchi HEART:  Regular rate rhythm, no murmurs, no rubs, no clicks, distant heart sounds Abd:  soft, positive bowel sounds, no organomegally, no rebound, no guarding Ext:  2 plus pulses, trace peripheral edema, no cyanosis, no clubbing Skin:  No rashes no nodules Neuro:  CN II through XII intact, motor grossly intact  Device interogation - normal function. See paceart  Assess/Plan: 1. Chronic systolic heart failure - his symptoms remain class 2. No change in meds 2. Gastric CA - note he is pending initiation of chemo. Should he need surgery, he would be an acceptable moderate surgical risk. 3. PVC's - he has at least 7% PVC burden on device interogation. He is asymptomatic 4. ICD - his St. Jude device is working normally. Will recheck in several months.  [name].D.

## 2015-07-17 NOTE — Therapy (Signed)
[place], [place], [address] Phone: [phone]   Fax:  [phone]  Physical Therapy Treatment  Patient Details  Name: [name] MRN: [id number] Date of Birth: 1935/04/06 Referring Provider: Dr. Betsy Coder  Encounter Date: 07/17/2015      PT End of Session - 07/17/15 1427    Visit Number 2   Date for PT Re-Evaluation 08/31/15   Authorization Type Medicare G-code at 10th visit; Kx modifier at 15   PT Start Time 1345   PT Stop Time 1428   PT Time Calculation (min) 43 min   Activity Tolerance Patient tolerated treatment well   Behavior During Therapy [facility] for tasks assessed/performed      Past Medical History  Diagnosis Date  . Asthma   . COPD (chronic obstructive pulmonary disease) (HCC)     FEV1 2.68/72%, FEV1/FVC 0.63 (08/22/04  . Colon polyps   . Hyperlipidemia   . HTN (hypertension)   . LBBB (left bundle branch block)     [name] 06/20/2014  . Arthritis        . Non-ischemic cardiomyopathy ([place])     a. s/p STJ CRTD 07/2014  . Chronic systolic heart failure ([place])   . AICD (automatic cardioverter/defibrillator) present   . Shortness of breath dyspnea   . Anemia   . History of blood transfusion   . Pneumonia   . Prostate cancer ([place]) 2010  . Malignant neoplasm pyloric antrum [facility])     Past Surgical History  Procedure Laterality Date  . Robot assisted laparoscopic radical prostatectomy  2010  . Cataract extraction w/ intraocular lens  implant, bilateral Bilateral 2010  . Left heart catheterization with coronary angiogram N/A 01/04/2014    no obstructive CAD  . Bi-ventricular implantable cardioverter defibrillator N/A 07/17/2014    STJ CRTD implanted by Dr Lovena Le  . Prostatectomy    . Colonoscopy    . Esophagogastroduodenoscopy (egd) with propofol N/A 06/27/2015    Procedure: ESOPHAGOGASTRODUODENOSCOPY (EGD) WITH PROPOFOL;  Surgeon: Manus Gunning, MD;  Location:  [place];  Service: Gastroenterology;  Laterality: N/A;  . Portacath placement N/A 07/11/2015    Procedure: INSERTION PORT-A-CATH WITH ULTRA SOUND GUIDANCE;  Surgeon: Stark Klein, MD;  [facility];  Service: General;  Laterality: N/A;    There were no vitals filed for this visit.  Visit Diagnosis:  Abnormality of gait  Weakness      Subjective Assessment - 07/17/15 1350    Subjective Pt reports that things has been going all right, Still gets winded going to the mailbox.    Patient is accompained by: Family member   Currently in Pain? No/denies   Pain Score 0-No pain                         [facility] Adult PT Treatment/Exercise - 07/17/15 0001    Exercises   Exercises Lumbar;Knee/Hip   Lumbar Exercises: Aerobic   Stationary Bike NuStep L4 x6    Lumbar Exercises: Standing   Other Standing Lumbar Exercises Standing march 2x10    Lumbar Exercises: Seated   Sit to Stand 5 reps  3 sets    Other Seated Lumbar Exercises Row #25; Lat #25 2x10   Knee/Hip Exercises: Machines for Strengthening   Cybex Knee Extension #25 2x10    Cybex Knee Flexion #35 2x10   Knee/Hip Exercises: Standing   Forward Step Up Both;1 set;Hand Hold: 0;10 reps;Step Height:  6"                  PT Short Term Goals - 07/06/15 1239    PT SHORT TERM GOAL #1   Title understand tips to avoid falls   Time 4   Period Weeks   Status New   PT SHORT TERM GOAL #2   Title Berg balance score >/= 52/56   Time 4   Period Weeks   Status New   PT SHORT TERM GOAL #3   Title sit to stand </= 18 sec   Time 4   Period Weeks   Status New   PT SHORT TERM GOAL #4   Title go to the mailbox with 25% less fatique   Time 4   Period Weeks   Status New   PT SHORT TERM GOAL #5   Title vacuum house with 25% less fatique   Time 4   Period Weeks   Status New           PT Long Term Goals - 07/06/15 1240    PT LONG TERM GOAL #1   Title independent with HEP   Time 8   Period Weeks    Status New   PT LONG TERM GOAL #2   Title Berg balance score is 56/56 to reduce risk of falls    Time 8   Period Weeks   Status New   PT LONG TERM GOAL #3   Title sit to stand </= 12 seconds to show increased endrance for furher cancer treatment    Time 8   Period Weeks   Status New   PT LONG TERM GOAL #4   Title walk to mailbox with 50% less fatique due to increased endurance   Time 8   Period Weeks   Status New   PT LONG TERM GOAL #5   Title vacuum home with 50% less fatique due to increased endurance   Time 8   Period Weeks   Status New               Plan - 07/17/15 1428    Clinical Impression Statement PT reports no pain this treatment date. Does report fatigue with sit to stand and standing march, requires seated water breaks for recovery. Completed all interventions well .   Pt will benefit from skilled therapeutic intervention in order to improve on the following deficits Decreased balance;Decreased activity tolerance;Decreased endurance;Decreased strength   Rehab Potential Excellent   Clinical Impairments Affecting Rehab Potential COPD and heart condition; presently has gastric cancer   PT Frequency 2x / week   PT Duration 8 weeks   PT Treatment/Interventions ADLs/Self Care Home Management;Balance training;Therapeutic exercise;Therapeutic activities;Functional mobility training;Neuromuscular re-education;Patient/family education;Energy conservation   PT Next Visit Plan endurance training, balance exercises, tips to avoid falls, overall strengthening        Problem List Patient Active Problem List   Diagnosis Date Noted  . Gastric cancer ([place]) 06/27/2015  . Anemia, iron deficiency   . Gastric mass   . Non-ischemic cardiomyopathy ([place]) 06/06/2015  . Ventricular ectopy 06/04/2015  . DOE (dyspnea on exertion) 06/04/2015  . Iron deficiency anemia 06/04/2015  . Seasonal and perennial allergic rhinitis 08/13/2014  . ICD (implantable  cardioverter-defibrillator), biventricular, in situ 07/17/2014  . Chronic systolic heart failure ([place]) 06/20/2014  . NEOPLASM, MALIGNANT, PROSTATE, HX OF, S/P TURP 05/31/2012  . Routine general medical examination at a health care facility 05/31/2012  . Pernicious anemia 04/15/2011  . Cervical  radiculitis 03/19/2011  . Type 2 diabetes mellitus with complications ([place]) 34/75/8307  . Left bundle branch block (LBBB) on electrocardiogram 04/27/2008  . Hyperlipidemia with target LDL less than 100 04/05/2008  . Essential hypertension 04/05/2008  . COPD with chronic bronchitis ([employer]) 06/02/2007    Scot Jun, PTA  07/17/2015, 2:31 PM  [place], [place], [address] Phone: [phone]   Fax:  [phone]  Name: [name] MRN: [id number] Date of Birth: 07/24/1934

## 2015-07-19 ENCOUNTER — Encounter: Payer: Self-pay | Admitting: Physical Therapy

## 2015-07-19 ENCOUNTER — Ambulatory Visit: Payer: Commercial Managed Care - HMO | Admitting: Physical Therapy

## 2015-07-19 ENCOUNTER — Other Ambulatory Visit (HOSPITAL_BASED_OUTPATIENT_CLINIC_OR_DEPARTMENT_OTHER): Payer: Commercial Managed Care - HMO

## 2015-07-19 ENCOUNTER — Ambulatory Visit (HOSPITAL_BASED_OUTPATIENT_CLINIC_OR_DEPARTMENT_OTHER): Payer: Commercial Managed Care - HMO

## 2015-07-19 VITALS — BP 123/67 | HR 88 | Temp 98.4°F | Resp 20

## 2015-07-19 DIAGNOSIS — C162 Malignant neoplasm of body of stomach: Secondary | ICD-10-CM

## 2015-07-19 DIAGNOSIS — Z5111 Encounter for antineoplastic chemotherapy: Secondary | ICD-10-CM

## 2015-07-19 DIAGNOSIS — R269 Unspecified abnormalities of gait and mobility: Secondary | ICD-10-CM

## 2015-07-19 DIAGNOSIS — C169 Malignant neoplasm of stomach, unspecified: Secondary | ICD-10-CM | POA: Diagnosis not present

## 2015-07-19 DIAGNOSIS — Z452 Encounter for adjustment and management of vascular access device: Secondary | ICD-10-CM

## 2015-07-19 DIAGNOSIS — R531 Weakness: Secondary | ICD-10-CM | POA: Diagnosis not present

## 2015-07-19 DIAGNOSIS — Z95828 Presence of other vascular implants and grafts: Secondary | ICD-10-CM

## 2015-07-19 LAB — CBC WITH DIFFERENTIAL/PLATELET
BASO%: 0.4 % (ref 0.0–2.0)
BASOS ABS: 0 10*3/uL (ref 0.0–0.1)
EOS ABS: 0.1 10*3/uL (ref 0.0–0.5)
EOS%: 1.3 % (ref 0.0–7.0)
HEMATOCRIT: 29.7 % — AB (ref 38.4–49.9)
HGB: 8.2 g/dL — ABNORMAL LOW (ref 13.0–17.1)
LYMPH%: 14.6 % (ref 14.0–49.0)
MCH: 18.1 pg — ABNORMAL LOW (ref 27.2–33.4)
MCHC: 27.6 g/dL — AB (ref 32.0–36.0)
MCV: 65.7 fL — AB (ref 79.3–98.0)
MONO#: 0.6 10*3/uL (ref 0.1–0.9)
MONO%: 8.2 % (ref 0.0–14.0)
NEUT#: 5 10*3/uL (ref 1.5–6.5)
NEUT%: 75.5 % — ABNORMAL HIGH (ref 39.0–75.0)
PLATELETS: 309 10*3/uL (ref 140–400)
RBC: 4.52 10*6/uL (ref 4.20–5.82)
RDW: 31 % — ABNORMAL HIGH (ref 11.0–14.6)
WBC: 6.7 10*3/uL (ref 4.0–10.3)
lymph#: 1 10*3/uL (ref 0.9–3.3)
nRBC: 0 % (ref 0–0)

## 2015-07-19 LAB — COMPREHENSIVE METABOLIC PANEL
ALT: 12 U/L (ref 0–55)
ANION GAP: 5 meq/L (ref 3–11)
AST: 19 U/L (ref 5–34)
Albumin: 3 g/dL — ABNORMAL LOW (ref 3.5–5.0)
Alkaline Phosphatase: 111 U/L (ref 40–150)
BUN: 10.4 mg/dL (ref 7.0–26.0)
CALCIUM: 8.7 mg/dL (ref 8.4–10.4)
CO2: 25 meq/L (ref 22–29)
Chloride: 108 mEq/L (ref 98–109)
Creatinine: 0.9 mg/dL (ref 0.7–1.3)
Glucose: 129 mg/dl (ref 70–140)
Potassium: 3.7 mEq/L (ref 3.5–5.1)
Sodium: 139 mEq/L (ref 136–145)
TOTAL PROTEIN: 6.3 g/dL — AB (ref 6.4–8.3)
Total Bilirubin: 0.37 mg/dL (ref 0.20–1.20)

## 2015-07-19 LAB — TECHNOLOGIST REVIEW

## 2015-07-19 MED ORDER — PALONOSETRON HCL INJECTION 0.25 MG/5ML
INTRAVENOUS | Status: AC
Start: 2015-07-19 — End: 2015-07-19
  Filled 2015-07-19: qty 5

## 2015-07-19 MED ORDER — FLUOROURACIL CHEMO INJECTION 2.5 GM/50ML
400.0000 mg/m2 | Freq: Once | INTRAVENOUS | Status: AC
  Administered 2015-07-19: 900 mg via INTRAVENOUS
  Filled 2015-07-19: qty 18

## 2015-07-19 MED ORDER — PALONOSETRON HCL INJECTION 0.25 MG/5ML
0.2500 mg | Freq: Once | INTRAVENOUS | Status: AC
  Administered 2015-07-19: 0.25 mg via INTRAVENOUS

## 2015-07-19 MED ORDER — LEUCOVORIN CALCIUM INJECTION 350 MG
395.0000 mg/m2 | Freq: Once | INTRAMUSCULAR | Status: AC
  Administered 2015-07-19: 900 mg via INTRAVENOUS
  Filled 2015-07-19: qty 45

## 2015-07-19 MED ORDER — DEXTROSE 5 % IV SOLN
Freq: Once | INTRAVENOUS | Status: AC
  Administered 2015-07-19: 12:00:00 via INTRAVENOUS

## 2015-07-19 MED ORDER — OXALIPLATIN CHEMO INJECTION 100 MG/20ML
87.0000 mg/m2 | Freq: Once | INTRAVENOUS | Status: AC
  Administered 2015-07-19: 200 mg via INTRAVENOUS
  Filled 2015-07-19: qty 40

## 2015-07-19 MED ORDER — SODIUM CHLORIDE 0.9% FLUSH
10.0000 mL | INTRAVENOUS | Status: DC | PRN
  Administered 2015-07-19: 10 mL via INTRAVENOUS
  Filled 2015-07-19: qty 10

## 2015-07-19 MED ORDER — SODIUM CHLORIDE 0.9 % IV SOLN
10.0000 mg | Freq: Once | INTRAVENOUS | Status: AC
  Administered 2015-07-19: 10 mg via INTRAVENOUS
  Filled 2015-07-19: qty 1

## 2015-07-19 MED ORDER — SODIUM CHLORIDE 0.9 % IV SOLN
2400.0000 mg/m2 | INTRAVENOUS | Status: DC
  Administered 2015-07-19: 5450 mg via INTRAVENOUS
  Filled 2015-07-19: qty 109

## 2015-07-19 NOTE — Therapy (Signed)
[place], [place], [address] Phone: [phone]   Fax:  [phone]  Physical Therapy Treatment  Patient Details  Name: [name] MRN: [id number] Date of Birth: 13-Nov-1934 Referring Provider: Dr. Betsy Coder  Encounter Date: 07/19/2015      PT End of Session - 07/19/15 0959    Visit Number 3   Date for PT Re-Evaluation 08/31/15   PT Start Time 0915   PT Stop Time 0959   PT Time Calculation (min) 44 min   Activity Tolerance Patient tolerated treatment well   Behavior During Therapy [facility] for tasks assessed/performed      Past Medical History  Diagnosis Date  . Asthma   . COPD (chronic obstructive pulmonary disease) (HCC)     FEV1 2.68/72%, FEV1/FVC 0.63 (08/22/04  . Colon polyps   . Hyperlipidemia   . HTN (hypertension)   . LBBB (left bundle branch block)     [name] 06/20/2014  . Arthritis        . Non-ischemic cardiomyopathy ([place])     a. s/p STJ CRTD 07/2014  . Chronic systolic heart failure ([place])   . AICD (automatic cardioverter/defibrillator) present   . Shortness of breath dyspnea   . Anemia   . History of blood transfusion   . Pneumonia   . Prostate cancer ([place]) 2010  . Malignant neoplasm pyloric antrum [facility])     Past Surgical History  Procedure Laterality Date  . Robot assisted laparoscopic radical prostatectomy  2010  . Cataract extraction w/ intraocular lens  implant, bilateral Bilateral 2010  . Left heart catheterization with coronary angiogram N/A 01/04/2014    no obstructive CAD  . Bi-ventricular implantable cardioverter defibrillator N/A 07/17/2014    STJ CRTD implanted by Dr Lovena Le  . Prostatectomy    . Colonoscopy    . Esophagogastroduodenoscopy (egd) with propofol N/A 06/27/2015    Procedure: ESOPHAGOGASTRODUODENOSCOPY (EGD) WITH PROPOFOL;  Surgeon: Manus Gunning, MD;  Location: [place];  Service: Gastroenterology;  Laterality: N/A;  .  Portacath placement N/A 07/11/2015    Procedure: INSERTION PORT-A-CATH WITH ULTRA SOUND GUIDANCE;  Surgeon: Stark Klein, MD;  [facility];  Service: General;  Laterality: N/A;    There were no vitals filed for this visit.  Visit Diagnosis:  Abnormality of gait  Weakness      Subjective Assessment - 07/19/15 0918    Subjective Pt reports that he was all right after last PT treatment. Legs was sore just a little other than that no problems   Currently in Pain? No/denies   Pain Score 0-No pain                         [facility] Adult PT Treatment/Exercise - 07/19/15 0001    Lumbar Exercises: Aerobic   Stationary Bike NuStep L4 x6    Lumbar Exercises: Standing   Other Standing Lumbar Exercises Standing march 2x10    Lumbar Exercises: Seated   Sit to Stand 10 reps  2 sets with UE use.    Other Seated Lumbar Exercises Row #25; Lat #25 2x15; OHP with blue ball 2x10   Knee/Hip Exercises: Machines for Strengthening   Cybex Knee Extension #25 2x10    Cybex Knee Flexion #35 2x15   Cybex Leg Press #30 2x10                  PT Short Term Goals - 07/06/15 1239  PT SHORT TERM GOAL #1   Title understand tips to avoid falls   Time 4   Period Weeks   Status New   PT SHORT TERM GOAL #2   Title Berg balance score >/= 52/56   Time 4   Period Weeks   Status New   PT SHORT TERM GOAL #3   Title sit to stand </= 18 sec   Time 4   Period Weeks   Status New   PT SHORT TERM GOAL #4   Title go to the mailbox with 25% less fatique   Time 4   Period Weeks   Status New   PT SHORT TERM GOAL #5   Title vacuum house with 25% less fatique   Time 4   Period Weeks   Status New           PT Long Term Goals - 07/06/15 1240    PT LONG TERM GOAL #1   Title independent with HEP   Time 8   Period Weeks   Status New   PT LONG TERM GOAL #2   Title Berg balance score is 56/56 to reduce risk of falls    Time 8   Period Weeks   Status New   PT LONG TERM GOAL #3    Title sit to stand </= 12 seconds to show increased endrance for furher cancer treatment    Time 8   Period Weeks   Status New   PT LONG TERM GOAL #4   Title walk to mailbox with 50% less fatique due to increased endurance   Time 8   Period Weeks   Status New   PT LONG TERM GOAL #5   Title vacuum home with 50% less fatique due to increased endurance   Time 8   Period Weeks   Status New               Plan - 07/19/15 1000    Clinical Impression Statement Pt tolerated additional interventions well, No pain noted but does report fatigue in LE. Reports that he has his first kemo treatment this afternoon.   Pt will benefit from skilled therapeutic intervention in order to improve on the following deficits Decreased balance;Decreased activity tolerance;Decreased endurance;Decreased strength   Rehab Potential Excellent   Clinical Impairments Affecting Rehab Potential COPD and heart condition; presently has gastric cancer   PT Frequency 2x / week   PT Duration 8 weeks   PT Treatment/Interventions ADLs/Self Care Home Management;Balance training;Therapeutic exercise;Therapeutic activities;Functional mobility training;Neuromuscular re-education;Patient/family education;Energy conservation   PT Next Visit Plan endurance training, balance exercises, tips to avoid falls, overall strengthening        Problem List Patient Active Problem List   Diagnosis Date Noted  . Gastric cancer ([place]) 06/27/2015  . Anemia, iron deficiency   . Gastric mass   . Non-ischemic cardiomyopathy ([place]) 06/06/2015  . Ventricular ectopy 06/04/2015  . DOE (dyspnea on exertion) 06/04/2015  . Iron deficiency anemia 06/04/2015  . Seasonal and perennial allergic rhinitis 08/13/2014  . ICD (implantable cardioverter-defibrillator), biventricular, in situ 07/17/2014  . Chronic systolic heart failure ([place]) 06/20/2014  . NEOPLASM, MALIGNANT, PROSTATE, HX OF, S/P TURP 05/31/2012  . Routine general medical  examination at a health care facility 05/31/2012  . Pernicious anemia 04/15/2011  . Cervical radiculitis 03/19/2011  . Type 2 diabetes mellitus with complications ([place]) 62/37/6283  . Left bundle branch block (LBBB) on electrocardiogram 04/27/2008  . Hyperlipidemia with target LDL less than 100 04/05/2008  .  Essential hypertension 04/05/2008  . COPD with chronic bronchitis ([place]) 06/02/2007    Scot Jun, PTA 07/19/2015, 10:03 AM  [place], [place], [address] Phone: [phone]   Fax:  [phone]  Name: [name] MRN: [id number] Date of Birth: 1934/07/26

## 2015-07-19 NOTE — Patient Instructions (Signed)

## 2015-07-19 NOTE — Patient Instructions (Signed)
[facility] Discharge Instructions for Patients Receiving Chemotherapy  Today you received the following chemotherapy agents: FOLFOX (oxaliplatin, leukovorin, and fluorouracil).  To help prevent nausea and vomiting after your treatment, we encourage you to take your nausea medication as prescribed.   If you develop nausea and vomiting that is not controlled by your nausea medication, call the clinic.   BELOW ARE SYMPTOMS THAT SHOULD BE REPORTED IMMEDIATELY:  *FEVER GREATER THAN 100.5 F  *CHILLS WITH OR WITHOUT FEVER  NAUSEA AND VOMITING THAT IS NOT CONTROLLED WITH YOUR NAUSEA MEDICATION  *UNUSUAL SHORTNESS OF BREATH  *UNUSUAL BRUISING OR BLEEDING  TENDERNESS IN MOUTH AND THROAT WITH OR WITHOUT PRESENCE OF ULCERS  *URINARY PROBLEMS  *BOWEL PROBLEMS  UNUSUAL RASH Items with * indicate a potential emergency and should be followed up as soon as possible.  Feel free to call the clinic you have any questions or concerns. The clinic phone number is [phone].  Please show the [place] at check-in to the Emergency Department and triage nurse.  Oxaliplatin Injection What is this medicine? OXALIPLATIN (ox AL i PLA tin) is a chemotherapy drug. It targets fast dividing cells, like cancer cells, and causes these cells to die. This medicine is used to treat cancers of the colon and rectum, and many other cancers. This medicine may be used for other purposes; ask your health care provider or pharmacist if you have questions. What should I tell my health care provider before I take this medicine? They need to know if you have any of these conditions: -kidney disease -an unusual or allergic reaction to oxaliplatin, other chemotherapy, other medicines, foods, dyes, or preservatives -pregnant or trying to get pregnant -breast-feeding How should I use this medicine? This drug is given as an infusion into a vein. It is administered in a hospital or clinic by a  specially trained health care professional. Talk to your pediatrician regarding the use of this medicine in children. Special care may be needed. Overdosage: If you think you have taken too much of this medicine contact a poison control center or emergency room at once. NOTE: This medicine is only for you. Do not share this medicine with others. What if I miss a dose? It is important not to miss a dose. Call your doctor or health care professional if you are unable to keep an appointment. What may interact with this medicine? -medicines to increase blood counts like filgrastim, pegfilgrastim, sargramostim -probenecid -some antibiotics like amikacin, gentamicin, neomycin, polymyxin B, streptomycin, tobramycin -zalcitabine Talk to your doctor or health care professional before taking any of these medicines: -acetaminophen -aspirin -ibuprofen -ketoprofen -naproxen This list may not describe all possible interactions. Give your health care provider a list of all the medicines, herbs, non-prescription drugs, or dietary supplements you use. Also tell them if you smoke, drink alcohol, or use illegal drugs. Some items may interact with your medicine. What should I watch for while using this medicine? Your condition will be monitored carefully while you are receiving this medicine. You will need important blood work done while you are taking this medicine. This medicine can make you more sensitive to cold. Do not drink cold drinks or use ice. Cover exposed skin before coming in contact with cold temperatures or cold objects. When out in cold weather wear warm clothing and cover your mouth and nose to warm the air that goes into your lungs. Tell your doctor if you get sensitive to the cold. This drug may make  you feel generally unwell. This is not uncommon, as chemotherapy can affect healthy cells as well as cancer cells. Report any side effects. Continue your course of treatment even though you feel ill  unless your doctor tells you to stop. In some cases, you may be given additional medicines to help with side effects. Follow all directions for their use. Call your doctor or health care professional for advice if you get a fever, chills or sore throat, or other symptoms of a cold or flu. Do not treat yourself. This drug decreases your body's ability to fight infections. Try to avoid being around people who are sick. This medicine may increase your risk to bruise or bleed. Call your doctor or health care professional if you notice any unusual bleeding. Be careful brushing and flossing your teeth or using a toothpick because you may get an infection or bleed more easily. If you have any dental work done, tell your dentist you are receiving this medicine. Avoid taking products that contain aspirin, acetaminophen, ibuprofen, naproxen, or ketoprofen unless instructed by your doctor. These medicines may hide a fever. Do not become pregnant while taking this medicine. Women should inform their doctor if they wish to become pregnant or think they might be pregnant. There is a potential for serious side effects to an unborn child. Talk to your health care professional or pharmacist for more information. Do not breast-feed an infant while taking this medicine. Call your doctor or health care professional if you get diarrhea. Do not treat yourself. What side effects may I notice from receiving this medicine? Side effects that you should report to your doctor or health care professional as soon as possible: -allergic reactions like skin rash, itching or hives, swelling of the face, lips, or tongue -low blood counts - This drug may decrease the number of white blood cells, red blood cells and platelets. You may be at increased risk for infections and bleeding. -signs of infection - fever or chills, cough, sore throat, pain or difficulty passing urine -signs of decreased platelets or bleeding - bruising, pinpoint  red spots on the skin, black, tarry stools, nosebleeds -signs of decreased red blood cells - unusually weak or tired, fainting spells, lightheadedness -breathing problems -chest pain, pressure -cough -diarrhea -jaw tightness -mouth sores -nausea and vomiting -pain, swelling, redness or irritation at the injection site -pain, tingling, numbness in the hands or feet -problems with balance, talking, walking -redness, blistering, peeling or loosening of the skin, including inside the mouth -trouble passing urine or change in the amount of urine Side effects that usually do not require medical attention (report to your doctor or health care professional if they continue or are bothersome): -changes in vision -constipation -hair loss -loss of appetite -metallic taste in the mouth or changes in taste -stomach pain This list may not describe all possible side effects. Call your doctor for medical advice about side effects. You may report side effects to FDA at 1-800-FDA-1088. Where should I keep my medicine? This drug is given in a hospital or clinic and will not be stored at home. NOTE: This sheet is a summary. It may not cover all possible information. If you have questions about this medicine, talk to your doctor, pharmacist, or health care provider.    2016, Elsevier/Gold Standard. (2007-11-16 17:22:47)  Fluorouracil, 5-FU injection What is this medicine? FLUOROURACIL, 5-FU (flure oh YOOR a sil) is a chemotherapy drug. It slows the growth of cancer cells. This medicine is used to treat  many types of cancer like breast cancer, colon or rectal cancer, pancreatic cancer, and stomach cancer. This medicine may be used for other purposes; ask your health care provider or pharmacist if you have questions. What should I tell my health care provider before I take this medicine? They need to know if you have any of these conditions: -blood disorders -dihydropyrimidine dehydrogenase (DPD)  deficiency -infection (especially a virus infection such as chickenpox, cold sores, or herpes) -kidney disease -liver disease -malnourished, poor nutrition -recent or ongoing radiation therapy -an unusual or allergic reaction to fluorouracil, other chemotherapy, other medicines, foods, dyes, or preservatives -pregnant or trying to get pregnant -breast-feeding How should I use this medicine? This drug is given as an infusion or injection into a vein. It is administered in a hospital or clinic by a specially trained health care professional. Talk to your pediatrician regarding the use of this medicine in children. Special care may be needed. Overdosage: If you think you have taken too much of this medicine contact a poison control center or emergency room at once. NOTE: This medicine is only for you. Do not share this medicine with others. What if I miss a dose? It is important not to miss your dose. Call your doctor or health care professional if you are unable to keep an appointment. What may interact with this medicine? -allopurinol -cimetidine -dapsone -digoxin -hydroxyurea -leucovorin -levamisole -medicines for seizures like ethotoin, fosphenytoin, phenytoin -medicines to increase blood counts like filgrastim, pegfilgrastim, sargramostim -medicines that treat or prevent blood clots like warfarin, enoxaparin, and dalteparin -methotrexate -metronidazole -pyrimethamine -some other chemotherapy drugs like busulfan, cisplatin, estramustine, vinblastine -trimethoprim -trimetrexate -vaccines Talk to your doctor or health care professional before taking any of these medicines: -acetaminophen -aspirin -ibuprofen -ketoprofen -naproxen This list may not describe all possible interactions. Give your health care provider a list of all the medicines, herbs, non-prescription drugs, or dietary supplements you use. Also tell them if you smoke, drink alcohol, or use illegal drugs. Some items  may interact with your medicine. What should I watch for while using this medicine? Visit your doctor for checks on your progress. This drug may make you feel generally unwell. This is not uncommon, as chemotherapy can affect healthy cells as well as cancer cells. Report any side effects. Continue your course of treatment even though you feel ill unless your doctor tells you to stop. In some cases, you may be given additional medicines to help with side effects. Follow all directions for their use. Call your doctor or health care professional for advice if you get a fever, chills or sore throat, or other symptoms of a cold or flu. Do not treat yourself. This drug decreases your body's ability to fight infections. Try to avoid being around people who are sick. This medicine may increase your risk to bruise or bleed. Call your doctor or health care professional if you notice any unusual bleeding. Be careful brushing and flossing your teeth or using a toothpick because you may get an infection or bleed more easily. If you have any dental work done, tell your dentist you are receiving this medicine. Avoid taking products that contain aspirin, acetaminophen, ibuprofen, naproxen, or ketoprofen unless instructed by your doctor. These medicines may hide a fever. Do not become pregnant while taking this medicine. Women should inform their doctor if they wish to become pregnant or think they might be pregnant. There is a potential for serious side effects to an unborn child. Talk to your health  care professional or pharmacist for more information. Do not breast-feed an infant while taking this medicine. Men should inform their doctor if they wish to father a child. This medicine may lower sperm counts. Do not treat diarrhea with over the counter products. Contact your doctor if you have diarrhea that lasts more than 2 days or if it is severe and watery. This medicine can make you more sensitive to the sun. Keep out  of the sun. If you cannot avoid being in the sun, wear protective clothing and use sunscreen. Do not use sun lamps or tanning beds/booths. What side effects may I notice from receiving this medicine? Side effects that you should report to your doctor or health care professional as soon as possible: -allergic reactions like skin rash, itching or hives, swelling of the face, lips, or tongue -low blood counts - this medicine may decrease the number of white blood cells, red blood cells and platelets. You may be at increased risk for infections and bleeding. -signs of infection - fever or chills, cough, sore throat, pain or difficulty passing urine -signs of decreased platelets or bleeding - bruising, pinpoint red spots on the skin, black, tarry stools, blood in the urine -signs of decreased red blood cells - unusually weak or tired, fainting spells, lightheadedness -breathing problems -changes in vision -chest pain -mouth sores -nausea and vomiting -pain, swelling, redness at site where injected -pain, tingling, numbness in the hands or feet -redness, swelling, or sores on hands or feet -stomach pain -unusual bleeding Side effects that usually do not require medical attention (report to your doctor or health care professional if they continue or are bothersome): -changes in finger or toe nails -diarrhea -dry or itchy skin -hair loss -headache -loss of appetite -sensitivity of eyes to the light -stomach upset -unusually teary eyes This list may not describe all possible side effects. Call your doctor for medical advice about side effects. You may report side effects to FDA at 1-800-FDA-1088. Where should I keep my medicine? This drug is given in a hospital or clinic and will not be stored at home. NOTE: This sheet is a summary. It may not cover all possible information. If you have questions about this medicine, talk to your doctor, pharmacist, or health care provider.    2016,  Elsevier/Gold Standard. (2007-08-25 13:53:16)  Leucovorin injection What is this medicine? LEUCOVORIN (loo koe VOR in) is used to prevent or treat the harmful effects of some medicines. This medicine is used to treat anemia caused by a low amount of folic acid in the body. It is also used with 5-fluorouracil (5-FU) to treat colon cancer. This medicine may be used for other purposes; ask your health care provider or pharmacist if you have questions. What should I tell my health care provider before I take this medicine? They need to know if you have any of these conditions: -anemia from low levels of vitamin B-12 in the blood -an unusual or allergic reaction to leucovorin, folic acid, other medicines, foods, dyes, or preservatives -pregnant or trying to get pregnant -breast-feeding How should I use this medicine? This medicine is for injection into a muscle or into a vein. It is given by a health care professional in a hospital or clinic setting. Talk to your pediatrician regarding the use of this medicine in children. Special care may be needed. Overdosage: If you think you have taken too much of this medicine contact a poison control center or emergency room at once. NOTE: This  medicine is only for you. Do not share this medicine with others. What if I miss a dose? This does not apply. What may interact with this medicine? -capecitabine -fluorouracil -phenobarbital -phenytoin -primidone -trimethoprim-sulfamethoxazole This list may not describe all possible interactions. Give your health care provider a list of all the medicines, herbs, non-prescription drugs, or dietary supplements you use. Also tell them if you smoke, drink alcohol, or use illegal drugs. Some items may interact with your medicine. What should I watch for while using this medicine? Your condition will be monitored carefully while you are receiving this medicine. This medicine may increase the side effects of  5-fluorouracil, 5-FU. Tell your doctor or health care professional if you have diarrhea or mouth sores that do not get better or that get worse. What side effects may I notice from receiving this medicine? Side effects that you should report to your doctor or health care professional as soon as possible: -allergic reactions like skin rash, itching or hives, swelling of the face, lips, or tongue -breathing problems -fever, infection -mouth sores -unusual bleeding or bruising -unusually weak or tired Side effects that usually do not require medical attention (report to your doctor or health care professional if they continue or are bothersome): -constipation or diarrhea -loss of appetite -nausea, vomiting This list may not describe all possible side effects. Call your doctor for medical advice about side effects. You may report side effects to FDA at 1-800-FDA-1088. Where should I keep my medicine? This drug is given in a hospital or clinic and will not be stored at home. NOTE: This sheet is a summary. It may not cover all possible information. If you have questions about this medicine, talk to your doctor, pharmacist, or health care provider.    2016, Elsevier/Gold Standard. (2007-10-26 16:50:29)

## 2015-07-20 LAB — CEA: CEA: 3.4 ng/mL (ref 0.0–4.7)

## 2015-07-21 ENCOUNTER — Ambulatory Visit (HOSPITAL_BASED_OUTPATIENT_CLINIC_OR_DEPARTMENT_OTHER): Payer: Commercial Managed Care - HMO

## 2015-07-21 VITALS — BP 107/65 | Temp 98.5°F | Resp 20

## 2015-07-21 DIAGNOSIS — C162 Malignant neoplasm of body of stomach: Secondary | ICD-10-CM | POA: Diagnosis not present

## 2015-07-21 MED ORDER — SODIUM CHLORIDE 0.9% FLUSH
10.0000 mL | INTRAVENOUS | Status: DC | PRN
  Administered 2015-07-21: 10 mL
  Filled 2015-07-21: qty 10

## 2015-07-21 MED ORDER — HEPARIN SOD (PORK) LOCK FLUSH 100 UNIT/ML IV SOLN
500.0000 [IU] | Freq: Once | INTRAVENOUS | Status: AC | PRN
  Administered 2015-07-21: 500 [IU]
  Filled 2015-07-21: qty 5

## 2015-07-24 ENCOUNTER — Encounter: Payer: Self-pay | Admitting: Physical Therapy

## 2015-07-24 ENCOUNTER — Ambulatory Visit: Payer: Commercial Managed Care - HMO | Admitting: Physical Therapy

## 2015-07-24 DIAGNOSIS — R531 Weakness: Secondary | ICD-10-CM | POA: Diagnosis not present

## 2015-07-24 DIAGNOSIS — R269 Unspecified abnormalities of gait and mobility: Secondary | ICD-10-CM | POA: Diagnosis not present

## 2015-07-24 NOTE — Therapy (Signed)
[facility] [place], [place], [address] Phone: [phone]   Fax:  [phone]  Physical Therapy Treatment  Patient Details  Name: [name] MRN: [id number] Date of Birth: 1934-08-18 Referring Provider: Dr. Betsy Coder  Encounter Date: 07/24/2015      PT End of Session - 07/24/15 1010    Visit Number 4   Date for PT Re-Evaluation 08/31/15   PT Start Time 0925   PT Stop Time 1012   PT Time Calculation (min) 47 min   Activity Tolerance Patient tolerated treatment well   Behavior During Therapy [facility] for tasks assessed/performed      Past Medical History  Diagnosis Date  . Asthma   . COPD (chronic obstructive pulmonary disease) (HCC)     FEV1 2.68/72%, FEV1/FVC 0.63 (08/22/04  . Colon polyps   . Hyperlipidemia   . HTN (hypertension)   . LBBB (left bundle branch block)     [name] 06/20/2014  . Arthritis        . Non-ischemic cardiomyopathy ([place])     a. s/p STJ CRTD 07/2014  . Chronic systolic heart failure ([place])   . AICD (automatic cardioverter/defibrillator) present   . Shortness of breath dyspnea   . Anemia   . History of blood transfusion   . Pneumonia   . Prostate cancer ([place]) 2010  . Malignant neoplasm pyloric antrum [facility])     Past Surgical History  Procedure Laterality Date  . Robot assisted laparoscopic radical prostatectomy  2010  . Cataract extraction w/ intraocular lens  implant, bilateral Bilateral 2010  . Left heart catheterization with coronary angiogram N/A 01/04/2014    no obstructive CAD  . Bi-ventricular implantable cardioverter defibrillator N/A 07/17/2014    STJ CRTD implanted by Dr Lovena Le  . Prostatectomy    . Colonoscopy    . Esophagogastroduodenoscopy (egd) with propofol N/A 06/27/2015    Procedure: ESOPHAGOGASTRODUODENOSCOPY (EGD) WITH PROPOFOL;  Surgeon: Manus Gunning, MD;  Location: [facility];  Service: Gastroenterology;  Laterality: N/A;  .  Portacath placement N/A 07/11/2015    Procedure: INSERTION PORT-A-CATH WITH ULTRA SOUND GUIDANCE;  Surgeon: Stark Klein, MD;  Location: [facility];  Service: General;  Laterality: N/A;    There were no vitals filed for this visit.  Visit Diagnosis:  Abnormality of gait  Weakness      Subjective Assessment - 07/24/15 0924    Subjective Pt reports that he had his 1st chemo treatment last week and was tired afterwards. Reports less difficulty walking to mailbox.   Currently in Pain? No/denies   Pain Score 0-No pain            [facility] PT Assessment - 07/24/15 0001    Standardized Balance Assessment   Five times sit to stand comments  14 seconds   Berg Balance Test   Sit to Stand Able to stand without using hands and stabilize independently   Standing Unsupported Able to stand safely 2 minutes   Sitting with Back Unsupported but Feet Supported on Floor or Stool Able to sit safely and securely 2 minutes   Stand to Sit Sits safely with minimal use of hands   Transfers Able to transfer safely, minor use of hands   Standing Unsupported with Eyes Closed Able to stand 10 seconds safely   Standing Ubsupported with Feet Together Able to place feet together independently and stand 1 minute safely   From Standing, Reach Forward with Outstretched Arm Can reach  confidently >25 cm (10")   From Standing Position, Pick up Object from [place] to pick up shoe safely and easily   From Standing Position, Turn to Look Behind Over each Shoulder Looks behind from both sides and weight shifts well   Turn 360 Degrees Able to turn 360 degrees safely in 4 seconds or less   Standing Unsupported, Alternately Place Feet on Step/Stool Able to stand independently and safely and complete 8 steps in 20 seconds   Standing Unsupported, One Foot in [place] to plae foot ahead of the other independently and hold 30 seconds   Standing on One Leg Able to lift leg independently and hold 5-10 seconds   Total Score 54    Berg comment: 25% chance to fall                     [facility] Adult PT Treatment/Exercise - 07/24/15 0001    Ambulation/Gait   Stairs Yes   Stairs Assistance 6: Modified independent (Device/Increase time)   Stair Management Technique One rail Left;One rail Right;Alternating pattern   Number of Stairs 24   Height of Stairs 6   Gait Comments For endurance, pt fatigues quickly on ascends    Lumbar Exercises: Aerobic   Stationary Bike NuStep L4 x7   Lumbar Exercises: Seated   Other Seated Lumbar Exercises Row #35; Lat #35 2x10; OHP with blue ball 2x15   Knee/Hip Exercises: Machines for Strengthening   Cybex Knee Extension #20 2x15   Cybex Knee Flexion #35 2x15   Cybex Leg Press #40 3x10                  PT Short Term Goals - 07/24/15 0942    PT SHORT TERM GOAL #2   Title [name] balance score >/= 52/56   Status Achieved   PT SHORT TERM GOAL #3   Title sit to stand </= 18 sec   Status Achieved           PT Long Term Goals - 07/24/15 0950    PT LONG TERM GOAL #2   Title [name] balance score is 56/56 to reduce risk of falls    Status On-going   PT LONG TERM GOAL #3   Title sit to stand </= 12 seconds to show increased endrance for furher cancer treatment    Status On-going   PT LONG TERM GOAL #4   Title walk to mailbox with 50% less fatique due to increased endurance   Status On-going               Plan - 07/24/15 1011    Clinical Impression Statement Pt has progressed and completed some short term goals. Increased weighted performed with most intervention. Tolerated a progression to stair negotiation but fatigues easily. Multiple rest breaks taken during today treatment.    Pt will benefit from skilled therapeutic intervention in order to improve on the following deficits Decreased balance;Decreased activity tolerance;Decreased endurance;Decreased strength   Rehab Potential Excellent   Clinical Impairments Affecting Rehab Potential COPD and heart  condition; presently has gastric cancer   PT Frequency 2x / week   PT Duration 8 weeks   PT Treatment/Interventions ADLs/Self Care Home Management;Balance training;Therapeutic exercise;Therapeutic activities;Functional mobility training;Neuromuscular re-education;Patient/family education;Energy conservation   PT Next Visit Plan endurance training, balance exercises, overall strengthening        Problem List Patient Active Problem List   Diagnosis Date Noted  . Gastric cancer ([place]) 06/27/2015  . Anemia,  iron deficiency   . Gastric mass   . Non-ischemic cardiomyopathy ([place]) 06/06/2015  . Ventricular ectopy 06/04/2015  . DOE (dyspnea on exertion) 06/04/2015  . Iron deficiency anemia 06/04/2015  . Seasonal and perennial allergic rhinitis 08/13/2014  . ICD (implantable cardioverter-defibrillator), biventricular, in situ 07/17/2014  . Chronic systolic heart failure ([place]) 06/20/2014  . NEOPLASM, MALIGNANT, PROSTATE, HX OF, S/P TURP 05/31/2012  . Routine general medical examination at a health care facility 05/31/2012  . Pernicious anemia 04/15/2011  . Cervical radiculitis 03/19/2011  . Type 2 diabetes mellitus with complications ([place]) 74/[id number]/4967  . Left bundle branch block (LBBB) on electrocardiogram 04/27/2008  . Hyperlipidemia with target LDL less than 100 04/05/2008  . Essential hypertension 04/05/2008  . COPD with chronic bronchitis ([place]) 06/02/2007    Scot Jun, PTA  07/24/2015, 10:13 AM  [place], [place], [address] Phone: [phone]   Fax:  [phone]  Name: MAKS CAVALLERO MRN: [id number] Date of Birth: 1934-05-14

## 2015-07-26 ENCOUNTER — Ambulatory Visit: Payer: Commercial Managed Care - HMO | Admitting: Physical Therapy

## 2015-07-26 ENCOUNTER — Encounter: Payer: Self-pay | Admitting: Physical Therapy

## 2015-07-26 DIAGNOSIS — R269 Unspecified abnormalities of gait and mobility: Secondary | ICD-10-CM

## 2015-07-26 DIAGNOSIS — R531 Weakness: Secondary | ICD-10-CM

## 2015-07-26 NOTE — Therapy (Signed)
[place], [place], [address] Phone: [phone]   Fax:  [phone]  Physical Therapy Treatment  Patient Details  Name: [name] MRN: [id number] Date of Birth: Jun 09, 1934 Referring Provider: Dr. Betsy Coder  Encounter Date: 07/26/2015      PT End of Session - 07/26/15 1006    Visit Number 5   Date for PT Re-Evaluation 08/31/15   Authorization Type Medicare G-code at 10th visit; Kx modifier at 15   PT Start Time 0930   PT Stop Time 1010   PT Time Calculation (min) 40 min   Activity Tolerance Patient tolerated treatment well;Patient limited by fatigue   Behavior During Therapy [facility] for tasks assessed/performed      Past Medical History  Diagnosis Date  . Asthma   . COPD (chronic obstructive pulmonary disease) (HCC)     FEV1 2.68/72%, FEV1/FVC 0.63 (08/22/04  . Colon polyps   . Hyperlipidemia   . HTN (hypertension)   . LBBB (left bundle branch block)     [name] 06/20/2014  . Arthritis        . Non-ischemic cardiomyopathy ([place])     a. s/p STJ CRTD 07/2014  . Chronic systolic heart failure ([place])   . AICD (automatic cardioverter/defibrillator) present   . Shortness of breath dyspnea   . Anemia   . History of blood transfusion   . Pneumonia   . Prostate cancer ([place]) 2010  . Malignant neoplasm pyloric antrum [facility])     Past Surgical History  Procedure Laterality Date  . Robot assisted laparoscopic radical prostatectomy  2010  . Cataract extraction w/ intraocular lens  implant, bilateral Bilateral 2010  . Left heart catheterization with coronary angiogram N/A 01/04/2014    no obstructive CAD  . Bi-ventricular implantable cardioverter defibrillator N/A 07/17/2014    STJ CRTD implanted by Dr Lovena Le  . Prostatectomy    . Colonoscopy    . Esophagogastroduodenoscopy (egd) with propofol N/A 06/27/2015    Procedure: ESOPHAGOGASTRODUODENOSCOPY (EGD) WITH PROPOFOL;  Surgeon: Manus Gunning, MD;  Location: [facility];  Service: Gastroenterology;  Laterality: N/A;  . Portacath placement N/A 07/11/2015    Procedure: INSERTION PORT-A-CATH WITH ULTRA SOUND GUIDANCE;  Surgeon: Stark Klein, MD;  [facility];  Service: General;  Laterality: N/A;    There were no vitals filed for this visit.  Visit Diagnosis:  Abnormality of gait  Weakness      Subjective Assessment - 07/26/15 0934    Subjective "Im feeling pretty good" "Im getting better going to the mail box"   Currently in Pain? No/denies   Pain Score 0-No pain                         [facility] Adult PT Treatment/Exercise - 07/26/15 0001    Lumbar Exercises: Aerobic   Stationary Bike NuStep L4 x7   UBE (Upper Arm Bike) L4 x4 min   for endurance    Lumbar Exercises: Standing   Other Standing Lumbar Exercises Standing march 2x10 #3    Other Standing Lumbar Exercises Standing hip ext and abd/add #3 x10   Lumbar Exercises: Seated   Sit to Stand 10 reps  2 sets with UE use.   Other Seated Lumbar Exercises Row #35; Lat #35 2x15; OHP with blue ball 3x10                  PT Short Term Goals -  07/24/15 0942    PT SHORT TERM GOAL #2   Title [name] balance score >/= 52/56   Status Achieved   PT SHORT TERM GOAL #3   Title sit to stand </= 18 sec   Status Achieved           PT Long Term Goals - 07/24/15 0950    PT LONG TERM GOAL #2   Title [name] balance score is 56/56 to reduce risk of falls    Status On-going   PT LONG TERM GOAL #3   Title sit to stand </= 12 seconds to show increased endrance for furher cancer treatment    Status On-going   PT LONG TERM GOAL #4   Title walk to mailbox with 50% less fatique due to increased endurance   Status On-going               Plan - 07/26/15 1006    Clinical Impression Statement Pt fatigues mostly with functional interventions. Reports no pain throughout treatment, although requires multiple seated rest breaks. Reports that he  feels better overall. "I think I move little better in the mornings."   Pt will benefit from skilled therapeutic intervention in order to improve on the following deficits Decreased balance;Decreased activity tolerance;Decreased endurance;Decreased strength   Rehab Potential Excellent   Clinical Impairments Affecting Rehab Potential COPD and heart condition; presently has gastric cancer   PT Frequency 2x / week   PT Duration 8 weeks   PT Treatment/Interventions ADLs/Self Care Home Management;Balance training;Therapeutic exercise;Therapeutic activities;Functional mobility training;Neuromuscular re-education;Patient/family education;Energy conservation   PT Next Visit Plan endurance training, balance exercises, overall strengthening        Problem List Patient Active Problem List   Diagnosis Date Noted  . Gastric cancer ([place]) 06/27/2015  . Anemia, iron deficiency   . Gastric mass   . Non-ischemic cardiomyopathy ([place]) 06/06/2015  . Ventricular ectopy 06/04/2015  . DOE (dyspnea on exertion) 06/04/2015  . Iron deficiency anemia 06/04/2015  . Seasonal and perennial allergic rhinitis 08/13/2014  . ICD (implantable cardioverter-defibrillator), biventricular, in situ 07/17/2014  . Chronic systolic heart failure ([place]) 06/20/2014  . NEOPLASM, MALIGNANT, PROSTATE, HX OF, S/P TURP 05/31/2012  . Routine general medical examination at a health care facility 05/31/2012  . Pernicious anemia 04/15/2011  . Cervical radiculitis 03/19/2011  . Type 2 diabetes mellitus with complications ([place]) 56/43/3295  . Left bundle branch block (LBBB) on electrocardiogram 04/27/2008  . Hyperlipidemia with target LDL less than 100 04/05/2008  . Essential hypertension 04/05/2008  . COPD with chronic bronchitis ([place]) 06/02/2007    Scot Jun, PTA  07/26/2015, 10:09 AM  [place], [place], [address] Phone: [phone]    Fax:  [phone]  Name: [name] MRN: [id number] Date of Birth: [birth year]

## 2015-07-29 ENCOUNTER — Other Ambulatory Visit: Payer: Self-pay | Admitting: Oncology

## 2015-07-30 ENCOUNTER — Other Ambulatory Visit: Payer: Self-pay | Admitting: Internal Medicine

## 2015-07-31 ENCOUNTER — Encounter: Payer: Self-pay | Admitting: Physical Therapy

## 2015-07-31 ENCOUNTER — Ambulatory Visit: Payer: Commercial Managed Care - HMO | Admitting: Physical Therapy

## 2015-07-31 DIAGNOSIS — R269 Unspecified abnormalities of gait and mobility: Secondary | ICD-10-CM | POA: Diagnosis not present

## 2015-07-31 DIAGNOSIS — R531 Weakness: Secondary | ICD-10-CM

## 2015-07-31 NOTE — Therapy (Signed)
[place], [place], [address] Phone: [phone]   Fax:  [phone]  Physical Therapy Treatment  Patient Details  Name: [name] MRN: [id number] Date of Birth: Sep 14, 1934 Referring Provider: Dr. Betsy Coder  Encounter Date: 07/31/2015      PT End of Session - 07/31/15 1002    Visit Number 6   Date for PT Re-Evaluation 08/31/15   Authorization Type Medicare G-code at 10th visit; Kx modifier at 15   PT Start Time 0920   PT Stop Time 1002   PT Time Calculation (min) 42 min   Activity Tolerance Patient tolerated treatment well;Patient limited by fatigue   Behavior During Therapy [facility] for tasks assessed/performed      Past Medical History  Diagnosis Date  . Asthma   . COPD (chronic obstructive pulmonary disease) (HCC)     FEV1 2.68/72%, FEV1/FVC 0.63 (08/22/04  . Colon polyps   . Hyperlipidemia   . HTN (hypertension)   . LBBB (left bundle branch block)     [name] 06/20/2014  . Arthritis        . Non-ischemic cardiomyopathy ([place])     a. s/p STJ CRTD 07/2014  . Chronic systolic heart failure ([place])   . AICD (automatic cardioverter/defibrillator) present   . Shortness of breath dyspnea   . Anemia   . History of blood transfusion   . Pneumonia   . Prostate cancer ([place]) 2010  . Malignant neoplasm pyloric antrum [facility])     Past Surgical History  Procedure Laterality Date  . Robot assisted laparoscopic radical prostatectomy  2010  . Cataract extraction w/ intraocular lens  implant, bilateral Bilateral 2010  . Left heart catheterization with coronary angiogram N/A 01/04/2014    no obstructive CAD  . Bi-ventricular implantable cardioverter defibrillator N/A 07/17/2014    STJ CRTD implanted by Dr Lovena Le  . Prostatectomy    . Colonoscopy    . Esophagogastroduodenoscopy (egd) with propofol N/A 06/27/2015    Procedure: ESOPHAGOGASTRODUODENOSCOPY (EGD) WITH PROPOFOL;  Surgeon: Manus Gunning, MD;  Location: [place];  Service: Gastroenterology;  Laterality: N/A;  . Portacath placement N/A 07/11/2015    Procedure: INSERTION PORT-A-CATH WITH ULTRA SOUND GUIDANCE;  Surgeon: Stark Klein, MD;  [facility];  Service: General;  Laterality: N/A;    There were no vitals filed for this visit.  Visit Diagnosis:  Abnormality of gait  Weakness      Subjective Assessment - 07/31/15 0918    Subjective "Everything is going pretty good"   Currently in Pain? No/denies   Pain Score 0-No pain                         [facility] Adult PT Treatment/Exercise - 07/31/15 0001    Ambulation/Gait   Ambulation/Gait Yes   Ambulation/Gait Assistance 6: Modified independent (Device/Increase time)   Ambulation Distance (Feet) --  > 300 ft around building    Assistive device None   Gait Pattern Step-to pattern   Ambulation Surface Unlevel;Paved;Outdoor   Gait Comments Amb around building up and  down slopes, decrease endurance with uphill walking.   Lumbar Exercises: Aerobic   Stationary Bike NuStep L3 x7   Lumbar Exercises: Seated   Sit to Stand 10 reps  x2 with UE support    Other Seated Lumbar Exercises Row #35; Lat #35 2x15; OHP with blue ball 3x10   Knee/Hip Exercises: Machines for Strengthening  Cybex Leg Press #50 3x10   Knee/Hip Exercises: Standing   Other Standing Knee Exercises Overhead halos with blue ball x10                   PT Short Term Goals - 07/24/15 0942    PT SHORT TERM GOAL #2   Title [name] balance score >/= 52/56   Status Achieved   PT SHORT TERM GOAL #3   Title sit to stand </= 18 sec   Status Achieved           PT Long Term Goals - 07/31/15 0948    PT LONG TERM GOAL #1   Title independent with HEP   Status Partially Met   PT LONG TERM GOAL #2   Title Berg balance score is 56/56 to reduce risk of falls    Status On-going   PT LONG TERM GOAL #3   Title sit to stand </= 12 seconds to show increased endrance for  furher cancer treatment    Status On-going   PT LONG TERM GOAL #4   Title walk to mailbox with 50% less fatique due to increased endurance   Status Partially Met   PT LONG TERM GOAL #5   Title vacuum home with 50% less fatique due to increased endurance   Baseline unable to try due to chemo port   Status On-going               Plan - 07/31/15 1002    Clinical Impression Statement Progressed with outdoor ambulation, demos a decrease endurance with uphill ambulation. After ambulation pt appeared to be fatigued throughout the remainder of treatment , hard to recover.  Despite fatigue completed all exercises well.   Pt will benefit from skilled therapeutic intervention in order to improve on the following deficits Decreased balance;Decreased activity tolerance;Decreased endurance;Decreased strength   Rehab Potential Excellent   Clinical Impairments Affecting Rehab Potential COPD and heart condition; presently has gastric cancer   PT Frequency 2x / week   PT Duration 8 weeks   PT Treatment/Interventions ADLs/Self Care Home Management;Balance training;Therapeutic exercise;Therapeutic activities;Functional mobility training;Neuromuscular re-education;Patient/family education;Energy conservation   PT Next Visit Plan endurance training, balance exercises, overall strengthening        Problem List Patient Active Problem List   Diagnosis Date Noted  . Gastric cancer ([place]) 06/27/2015  . Anemia, iron deficiency   . Gastric mass   . Non-ischemic cardiomyopathy ([place]) 06/06/2015  . Ventricular ectopy 06/04/2015  . DOE (dyspnea on exertion) 06/04/2015  . Iron deficiency anemia 06/04/2015  . Seasonal and perennial allergic rhinitis 08/13/2014  . ICD (implantable cardioverter-defibrillator), biventricular, in situ 07/17/2014  . Chronic systolic heart failure ([place]) 06/20/2014  . NEOPLASM, MALIGNANT, PROSTATE, HX OF, S/P TURP 05/31/2012  . Routine general medical examination at a health  care facility 05/31/2012  . Pernicious anemia 04/15/2011  . Cervical radiculitis 03/19/2011  . Type 2 diabetes mellitus with complications ([place]) 14/43/1540  . Left bundle branch block (LBBB) on electrocardiogram 04/27/2008  . Hyperlipidemia with target LDL less than 100 04/05/2008  . Essential hypertension 04/05/2008  . COPD with chronic bronchitis ([place]) 06/02/2007    Scot Jun, PTA  07/31/2015, 10:06 AM  [place], [place], [address] Phone: [phone]   Fax:  [phone]  Name: [name] MRN: [id number] Date of Birth: 04/11/1935

## 2015-08-02 ENCOUNTER — Encounter: Payer: Self-pay | Admitting: Nurse Practitioner

## 2015-08-02 ENCOUNTER — Ambulatory Visit (HOSPITAL_BASED_OUTPATIENT_CLINIC_OR_DEPARTMENT_OTHER): Payer: Commercial Managed Care - HMO

## 2015-08-02 ENCOUNTER — Telehealth: Payer: Self-pay | Admitting: *Deleted

## 2015-08-02 ENCOUNTER — Other Ambulatory Visit (HOSPITAL_BASED_OUTPATIENT_CLINIC_OR_DEPARTMENT_OTHER): Payer: Commercial Managed Care - HMO

## 2015-08-02 ENCOUNTER — Ambulatory Visit (HOSPITAL_BASED_OUTPATIENT_CLINIC_OR_DEPARTMENT_OTHER): Payer: Commercial Managed Care - HMO | Admitting: Nurse Practitioner

## 2015-08-02 ENCOUNTER — Telehealth: Payer: Self-pay | Admitting: Nurse Practitioner

## 2015-08-02 VITALS — BP 128/64 | HR 83 | Temp 97.5°F | Resp 17 | Ht 74.0 in | Wt 217.6 lb

## 2015-08-02 DIAGNOSIS — D509 Iron deficiency anemia, unspecified: Secondary | ICD-10-CM | POA: Diagnosis not present

## 2015-08-02 DIAGNOSIS — C169 Malignant neoplasm of stomach, unspecified: Secondary | ICD-10-CM

## 2015-08-02 DIAGNOSIS — C162 Malignant neoplasm of body of stomach: Secondary | ICD-10-CM

## 2015-08-02 DIAGNOSIS — Z452 Encounter for adjustment and management of vascular access device: Secondary | ICD-10-CM

## 2015-08-02 DIAGNOSIS — C7972 Secondary malignant neoplasm of left adrenal gland: Secondary | ICD-10-CM

## 2015-08-02 DIAGNOSIS — I10 Essential (primary) hypertension: Secondary | ICD-10-CM

## 2015-08-02 DIAGNOSIS — Z95828 Presence of other vascular implants and grafts: Secondary | ICD-10-CM

## 2015-08-02 DIAGNOSIS — J449 Chronic obstructive pulmonary disease, unspecified: Secondary | ICD-10-CM

## 2015-08-02 DIAGNOSIS — Z5111 Encounter for antineoplastic chemotherapy: Secondary | ICD-10-CM

## 2015-08-02 DIAGNOSIS — Z8546 Personal history of malignant neoplasm of prostate: Secondary | ICD-10-CM

## 2015-08-02 LAB — CBC WITH DIFFERENTIAL/PLATELET
BASO%: 0.7 % (ref 0.0–2.0)
Basophils Absolute: 0 10*3/uL (ref 0.0–0.1)
EOS%: 2 % (ref 0.0–7.0)
Eosinophils Absolute: 0.1 10*3/uL (ref 0.0–0.5)
HEMATOCRIT: 30.7 % — AB (ref 38.4–49.9)
HEMOGLOBIN: 9 g/dL — AB (ref 13.0–17.1)
LYMPH#: 0.7 10*3/uL — AB (ref 0.9–3.3)
LYMPH%: 21.9 % (ref 14.0–49.0)
MCH: 19.5 pg — ABNORMAL LOW (ref 27.2–33.4)
MCHC: 29.4 g/dL — AB (ref 32.0–36.0)
MCV: 66.4 fL — ABNORMAL LOW (ref 79.3–98.0)
MONO#: 0.5 10*3/uL (ref 0.1–0.9)
MONO%: 14.4 % — ABNORMAL HIGH (ref 0.0–14.0)
NEUT#: 2 10*3/uL (ref 1.5–6.5)
NEUT%: 61 % (ref 39.0–75.0)
PLATELETS: 179 10*3/uL (ref 140–400)
RBC: 4.63 10*6/uL (ref 4.20–5.82)
RDW: 33.5 % — AB (ref 11.0–14.6)
WBC: 3.3 10*3/uL — ABNORMAL LOW (ref 4.0–10.3)

## 2015-08-02 LAB — COMPREHENSIVE METABOLIC PANEL
ALBUMIN: 3 g/dL — AB (ref 3.5–5.0)
ALT: 11 U/L (ref 0–55)
ANION GAP: 7 meq/L (ref 3–11)
AST: 19 U/L (ref 5–34)
Alkaline Phosphatase: 114 U/L (ref 40–150)
BUN: 8.6 mg/dL (ref 7.0–26.0)
CALCIUM: 8.8 mg/dL (ref 8.4–10.4)
CHLORIDE: 108 meq/L (ref 98–109)
CO2: 24 mEq/L (ref 22–29)
CREATININE: 0.9 mg/dL (ref 0.7–1.3)
EGFR: >90 mL/min/{1.73_m2} (ref >90)
Glucose: 125 mg/dl (ref 70–140)
Potassium: 3.6 mEq/L (ref 3.5–5.1)
Sodium: 139 mEq/L (ref 136–145)
TOTAL PROTEIN: 6.2 g/dL — AB (ref 6.4–8.3)
Total Bilirubin: 0.36 mg/dL (ref 0.20–1.20)

## 2015-08-02 MED ORDER — OXALIPLATIN CHEMO INJECTION 100 MG/20ML
87.0000 mg/m2 | Freq: Once | INTRAVENOUS | Status: AC
  Administered 2015-08-02: 200 mg via INTRAVENOUS
  Filled 2015-08-02: qty 40

## 2015-08-02 MED ORDER — FLUOROURACIL CHEMO INJECTION 2.5 GM/50ML
400.0000 mg/m2 | Freq: Once | INTRAVENOUS | Status: AC
Start: 2015-08-02 — End: 2015-08-02
  Administered 2015-08-02: 900 mg via INTRAVENOUS
  Filled 2015-08-02: qty 18

## 2015-08-02 MED ORDER — LEUCOVORIN CALCIUM INJECTION 350 MG
395.0000 mg/m2 | Freq: Once | INTRAMUSCULAR | Status: AC
  Administered 2015-08-02: 900 mg via INTRAVENOUS
  Filled 2015-08-02: qty 45

## 2015-08-02 MED ORDER — DEXTROSE 5 % IV SOLN
Freq: Once | INTRAVENOUS | Status: AC
  Administered 2015-08-02: 12:00:00 via INTRAVENOUS

## 2015-08-02 MED ORDER — PALONOSETRON HCL INJECTION 0.25 MG/5ML
0.2500 mg | Freq: Once | INTRAVENOUS | Status: AC
  Administered 2015-08-02: 0.25 mg via INTRAVENOUS

## 2015-08-02 MED ORDER — SODIUM CHLORIDE 0.9 % IV SOLN
10.0000 mg | Freq: Once | INTRAVENOUS | Status: AC
  Administered 2015-08-02: 10 mg via INTRAVENOUS
  Filled 2015-08-02: qty 1

## 2015-08-02 MED ORDER — SODIUM CHLORIDE 0.9% FLUSH
10.0000 mL | INTRAVENOUS | Status: DC | PRN
  Administered 2015-08-02: 10 mL via INTRAVENOUS
  Filled 2015-08-02: qty 10

## 2015-08-02 MED ORDER — SODIUM CHLORIDE 0.9 % IV SOLN
2400.0000 mg/m2 | INTRAVENOUS | Status: DC
  Administered 2015-08-02: 5450 mg via INTRAVENOUS
  Filled 2015-08-02: qty 109

## 2015-08-02 MED ORDER — PALONOSETRON HCL INJECTION 0.25 MG/5ML
INTRAVENOUS | Status: AC
  Filled 2015-08-02: qty 5

## 2015-08-02 NOTE — Telephone Encounter (Signed)
per pof to sch pt trmt-pt to get updated copy b4 leaving

## 2015-08-02 NOTE — Telephone Encounter (Signed)
Per staff message and POF I have scheduled appts. Advised scheduler of appts. JMW  

## 2015-08-02 NOTE — Patient Instructions (Signed)

## 2015-08-02 NOTE — Progress Notes (Signed)
Blood return noted before and after Adrucil push.  

## 2015-08-02 NOTE — Patient Instructions (Signed)
[facility] Discharge Instructions for Patients Receiving Chemotherapy  Today you received the following chemotherapy agents: Oxaliplatin, Leucovorin, Adrucil   To help prevent nausea and vomiting after your treatment, we encourage you to take your nausea medication as directed.    If you develop nausea and vomiting that is not controlled by your nausea medication, call the clinic.   BELOW ARE SYMPTOMS THAT SHOULD BE REPORTED IMMEDIATELY:  *FEVER GREATER THAN 100.5 F  *CHILLS WITH OR WITHOUT FEVER  NAUSEA AND VOMITING THAT IS NOT CONTROLLED WITH YOUR NAUSEA MEDICATION  *UNUSUAL SHORTNESS OF BREATH  *UNUSUAL BRUISING OR BLEEDING  TENDERNESS IN MOUTH AND THROAT WITH OR WITHOUT PRESENCE OF ULCERS  *URINARY PROBLEMS  *BOWEL PROBLEMS  UNUSUAL RASH Items with * indicate a potential emergency and should be followed up as soon as possible.  Feel free to call the clinic you have any questions or concerns. The clinic phone number is [phone].  Please show the [facility] at check-in to the Emergency Department and triage nurse.

## 2015-08-02 NOTE — Progress Notes (Signed)
  [facility] OFFICE PROGRESS NOTE   Diagnosis:  Gastric cancer  INTERVAL HISTORY:   [name] returns as scheduled. He completed cycle 1 FOLFOX 07/19/2015. He denies nausea/vomiting. No mouth sores. No diarrhea. For the most part he has been avoiding cold exposure. He drank something cold yesterday with no problem. He continues oral iron.  Objective:  Vital signs in last 24 hours:  Blood pressure 128/64, pulse 83, temperature 97.5 F (36.4 C), temperature source Oral, resp. rate 17, height '6\' 2"'$  (1.88 m), weight 217 lb 9.6 oz (98.703 kg), SpO2 95 %.    HEENT: No thrush or ulcers. Resp: Lungs clear bilaterally. Cardio: Regular rate and rhythm. GI: Abdomen soft and nontender. No hepatomegaly. Vascular: Trace lower leg edema bilaterally. Port-A-Cath without erythema.   Lab Results:  Lab Results  Component Value Date   WBC 3.3* 08/02/2015   HGB 9.0* 08/02/2015   HCT 30.7* 08/02/2015   MCV 66.4* 08/02/2015   PLT 179 08/02/2015   NEUTROABS 2.0 08/02/2015    Imaging:  No results found.  Medications: I have reviewed the patient's current medications.  Assessment/Plan: 1. Gastric Cancer  Lesser curvature gastric body mass noted on endoscopy 06/27/2015, biopsy confirmed adenocarcinoma  Staging CT scans to 06/28/2015 with a gastric body mass, adenopathy adjacent to the stomach, chest adenopathy, a single enhancing liver lesion, and indeterminate nodular pulmonary lesions  PET scan 07/12/2015-hypermetabolic gastric mass, gastrohepatic ligament metastases, metastasis at the left adrenal gland, hypermetabolic right paratracheal, right hilar, and subcarinal nodes moderate metabolic activity involving 2 pulmonary lesions  Cycle 1 FOLFOX 07/19/2015  Cycle 2 FOLFOX 08/02/2015  2. COPD  3. Nonischemic cardiomyopathy  4. Implanted cardiac defibrillator  5. History of prostate cancer  6. Hypertension  7. Iron deficiency  anemia    Disposition: [name] appears stable. He has completed 1 cycle of FOLFOX. He tolerated the first cycle well. Plan to proceed with cycle 2 today as scheduled. He will return for follow-up visit and cycle 3 in 2 weeks. He will contact the office in the interim with any problems.    Ned Card ANP/GNP-BC   08/02/2015  11:07 AM

## 2015-08-04 ENCOUNTER — Ambulatory Visit (HOSPITAL_BASED_OUTPATIENT_CLINIC_OR_DEPARTMENT_OTHER): Payer: Commercial Managed Care - HMO

## 2015-08-04 VITALS — BP 147/71 | HR 78 | Temp 97.7°F | Resp 16

## 2015-08-04 DIAGNOSIS — C162 Malignant neoplasm of body of stomach: Secondary | ICD-10-CM

## 2015-08-04 MED ORDER — HEPARIN SOD (PORK) LOCK FLUSH 100 UNIT/ML IV SOLN
500.0000 [IU] | Freq: Once | INTRAVENOUS | Status: AC | PRN
  Administered 2015-08-04: 500 [IU]
  Filled 2015-08-04: qty 5

## 2015-08-04 MED ORDER — SODIUM CHLORIDE 0.9% FLUSH
10.0000 mL | INTRAVENOUS | Status: DC | PRN
  Administered 2015-08-04: 10 mL
  Filled 2015-08-04: qty 10

## 2015-08-10 ENCOUNTER — Ambulatory Visit (INDEPENDENT_AMBULATORY_CARE_PROVIDER_SITE_OTHER): Payer: Commercial Managed Care - HMO | Admitting: Internal Medicine

## 2015-08-10 ENCOUNTER — Encounter: Payer: Self-pay | Admitting: Internal Medicine

## 2015-08-10 VITALS — BP 128/72 | HR 81 | Ht 74.0 in | Wt 215.4 lb

## 2015-08-10 DIAGNOSIS — J449 Chronic obstructive pulmonary disease, unspecified: Secondary | ICD-10-CM | POA: Diagnosis not present

## 2015-08-10 DIAGNOSIS — I5022 Chronic systolic (congestive) heart failure: Secondary | ICD-10-CM | POA: Diagnosis not present

## 2015-08-10 MED ORDER — FLUTICASONE FUROATE-VILANTEROL 100-25 MCG/INH IN AEPB: 1.0000 | INHALATION_SPRAY | Freq: Every day | RESPIRATORY_TRACT | Status: DC

## 2015-08-10 MED ORDER — HYDROCODONE-HOMATROPINE 5-1.5 MG/5ML PO SYRP: 5.0000 mL | ORAL_SOLUTION | Freq: Four times a day (QID) | ORAL | Status: DC | PRN

## 2015-08-10 NOTE — Progress Notes (Signed)
11/18/12- 44 former smoker  yoM re-establishing for COPD                            CP Dr Darene Lamer. Ronnald Ramp Has been using Anoro, Daliresp. FOLLOWS FOR: throat congestion, wheezing at night, occ prod cough with clear mucus x3days Main complaint is increased throat clearing for 6 weeks. Dr. Ronnald Ramp gave Daliresp, helpful but too expensive. Dulera changed to lAnoro, started shortly before throat became hoarse. Denies dysphagia or throat pain. Occasional sneeze. CXR 06/13/11 IMPRESSION:  Stable chest x-ray with slight hyperaeration. No active lung  disease.  Original Report Authenticated By: Joretta Bachelor, M.D.  01/04/13- 39 former smoker  yoM re-establishing for COPD                            PCP Dr Darene Lamer. Ronnald Ramp FOLLOWS FOR: has been using Dulera in the mornings for the most part(trying to get the second dose in at night); denies any wheezing, cough, congestion, or SOB today. Does not want flu vaccine "made him sick". Feels well controlled now with Dulera once daily. Medications reviewed. Daliresp was too expensive.  08/08/13- 28 former smoker  yoM re-establishing for COPD                            PCP Dr Darene Lamer. Ronnald Ramp ACUTE VISIT: continues to have "tickle" cough/congestion in back of throat. When lying down at night has to clear throat alot. Upper airway rattle/tickle cough lying down. Some dyspnea on exertion. Daliresp was too expensive. Using [facility]. CXR 01/05/13 IMPRESSION:  Underlying emphysema. No edema or consolidation.  Original Report Authenticated By: Lowella Grip, M.D.  02/07/14- 19 yoM  former smoker followed for COPD, complicated by DM2, CAD/CM, anemia/ FE          PCP Dr Darene Lamer. Ronnald Ramp FOLLOWS FOR: Pt states he is getting short winded all the time(walking up hills causes the most trouble). Had Myocardial imaging , then Echo showing EF 25-30% with akinesis, moderate Pulmonary hypertension, Grade 1 dCHF, LBBB Increased dyspnea on exertion especially hills and stairs. Occasional wheeze but not cough. Using  [facility] once or twice daily. Anemia Hgb 10.8 on BASA. Had colonoscopy Often forgets theophylline at breakfast time when he skips breakfast. Office Spirometry 02/07/14- moderate airway obstruction. FVC 3.45/84%, FEV1 1.86/60%, FEV1/FVC 0.54, FEF 25-75% 0.67/22%  08/10/14- 78 yoM  former smoker followed for COPD, complicated by DM2, CAD/CM, anemia/ FE          PCP Dr Alona Bene FOLLOWS FOR: PND with cough-clear in color in am. Noticing less energy and easier dyspnea on exertion with no significant acute event. He recognizes contribution from heart and from lungs. Minor occasional cough. Little wheeze. CXR 07/27/14-  IMPRESSION: No change in position of the AICD and pacer leads when compared to prior chest x-ray. No active cardiopulmonary disease. Electronically Signed  By: Ivar Drape M.D.  On: 07/27/2014 14:17  09/18/14- - 41 yoM  former smoker followed for COPD, complicated by DM2, CAD/CM/ AICD/ pacer, anemia/ FE          PCP Dr Darene Lamer. Ronnald Ramp Follows For: Pt states breathing is doing fine. He c/o of a prod cough with yellow thick mucus, he says cough worsens at bedtime and early mornings. SOB with exertion. At night he notices postnasal drip, sleeping on 2 pillows. Some cough. Denies fever, headache or sore  throat. His cough at night bothers his wife. CXR 07/27/14- IMPRESSION: No change in position of the AICD and pacer leads when compared to prior chest x-ray. No active cardiopulmonary disease. Electronically Signed  By: Ivar Drape M.D.  On: 07/27/2014 14:17  02/09/15-  55 yoM  former smoker followed for COPD, complicated by DM2, CAD/CM/ AICD/ pacer, anemia/ FE  PCP Dr Darene Lamer. Ronnald Ramp FOLLOWS FOR:pt. states he has occ. SOB. no wheezing. denies chest pain or tightness. no cough or congestion. Cough has been much better since last visit. We reviewed his vaccination history. He has had enough pneumococcal vaccine.  08/10/2015-81 year old male former smoker followed for COPD, Located by DM 2,  CAD/CM/AICD/pacer, anemia/FE, gastric cancer FOLLOWS FOR: Pt has not had any inahler use since first of March 2017. Insurance will not cover Dulera-phone note suggests having patient use Breo100 , Symbicort 160, or Advair 250. No changes in breathing since last visit.  Chemotherapy for gastric cancer. Still taking Uniphyl one half tablet daily-discussed. Occasional cough-asks refill cough syrup. Little mucus. No blood. Some postnasal drip at night.  ROS-see HPI Constitutional:   No-   weight loss, night sweats, fevers, chills, fatigue, lassitude. HEENT:   No-  headaches, difficulty swallowing, tooth/dental problems, sore throat,       No-  sneezing, itching, ear ache, +nasal congestion, post nasal drip,  CV:  No-   chest pain, orthopnea, PND, swelling in lower extremities, anasarca, dizziness, palpitations Resp: + shortness of breath with exertion or at rest.              productive cough,  + non-productive cough,  No- coughing up of blood.                change in color of mucus.  No- wheezing.   Skin: No-   rash or lesions. GI:  No-   heartburn, indigestion, abdominal pain, nausea, vomiting, GU:  MS:  No-   joint pain or swelling.   Neuro-     nothing unusual Psych:  No- change in mood or affect. No depression or anxiety.  No memory loss.  OBJ- Physical Exam General- Alert, Oriented, Affect-appropriate, Distress- none acute Skin- rash-none, lesions- none, excoriation- none Lymphadenopathy- none Head- atraumatic            Eyes- Gross vision intact, PERRLA, conjunctivae and secretions clear            Ears- Hearing, canals-normal            Nose- Clear, no-Septal dev, mucus, polyps, erosion, perforation             Throat- Mallampati III , mucosa clear , drainage -none, tonsils- atrophic Neck- flexible , trachea midline, no stridor , thyroid nl, carotid no bruit Chest - symmetrical excursion , unlabored           Heart/CV- RRR , no murmur , no gallop  , no rub, nl s1 s2                            - JVD- none , edema- none, stasis changes- none, varices- none           Lung- + coarse breath sounds left base, wheeze- none, cough -none , dullness-none, rub- none           Chest wall-  L  AICD, R chest port Abd-  Br/ Gen/ Rectal- Not done, not indicated Extrem- cyanosis- none, clubbing, none, atrophy- none,  strength- nl Neuro- grossly intact to observation

## 2015-08-10 NOTE — Patient Instructions (Signed)
Sample and script Breo 100   Inhale 1 puff then rinse mouth, once daily         Instead of Dulera  Script for hydromet cough syrup to use if needed  Please call if we can help

## 2015-08-12 ENCOUNTER — Other Ambulatory Visit: Payer: Self-pay | Admitting: Oncology

## 2015-08-16 ENCOUNTER — Ambulatory Visit: Payer: Commercial Managed Care - HMO

## 2015-08-16 ENCOUNTER — Encounter: Payer: Commercial Managed Care - HMO | Admitting: Nutrition

## 2015-08-16 ENCOUNTER — Other Ambulatory Visit: Payer: Self-pay | Admitting: *Deleted

## 2015-08-16 ENCOUNTER — Other Ambulatory Visit: Payer: Self-pay | Admitting: Oncology

## 2015-08-16 ENCOUNTER — Other Ambulatory Visit (HOSPITAL_BASED_OUTPATIENT_CLINIC_OR_DEPARTMENT_OTHER): Payer: Commercial Managed Care - HMO

## 2015-08-16 ENCOUNTER — Telehealth: Payer: Self-pay | Admitting: Oncology

## 2015-08-16 ENCOUNTER — Telehealth: Payer: Self-pay | Admitting: *Deleted

## 2015-08-16 ENCOUNTER — Ambulatory Visit (HOSPITAL_BASED_OUTPATIENT_CLINIC_OR_DEPARTMENT_OTHER): Payer: Commercial Managed Care - HMO | Admitting: Oncology

## 2015-08-16 VITALS — BP 145/60 | HR 85 | Temp 98.2°F | Resp 19 | Ht 74.0 in | Wt 220.1 lb

## 2015-08-16 DIAGNOSIS — C162 Malignant neoplasm of body of stomach: Secondary | ICD-10-CM

## 2015-08-16 DIAGNOSIS — C169 Malignant neoplasm of stomach, unspecified: Secondary | ICD-10-CM

## 2015-08-16 DIAGNOSIS — Z452 Encounter for adjustment and management of vascular access device: Secondary | ICD-10-CM

## 2015-08-16 DIAGNOSIS — C7972 Secondary malignant neoplasm of left adrenal gland: Secondary | ICD-10-CM

## 2015-08-16 DIAGNOSIS — D509 Iron deficiency anemia, unspecified: Secondary | ICD-10-CM

## 2015-08-16 DIAGNOSIS — D701 Agranulocytosis secondary to cancer chemotherapy: Secondary | ICD-10-CM

## 2015-08-16 DIAGNOSIS — I10 Essential (primary) hypertension: Secondary | ICD-10-CM

## 2015-08-16 DIAGNOSIS — Z95828 Presence of other vascular implants and grafts: Secondary | ICD-10-CM

## 2015-08-16 LAB — CBC WITH DIFFERENTIAL/PLATELET
BASO%: 0.5 % (ref 0.0–2.0)
Basophils Absolute: 0 10*3/uL (ref 0.0–0.1)
EOS%: 2 % (ref 0.0–7.0)
Eosinophils Absolute: 0 10*3/uL (ref 0.0–0.5)
HEMATOCRIT: 32.2 % — AB (ref 38.4–49.9)
HGB: 9.6 g/dL — ABNORMAL LOW (ref 13.0–17.1)
LYMPH%: 37.2 % (ref 14.0–49.0)
MCH: 20.8 pg — ABNORMAL LOW (ref 27.2–33.4)
MCHC: 29.8 g/dL — AB (ref 32.0–36.0)
MCV: 69.8 fL — ABNORMAL LOW (ref 79.3–98.0)
MONO#: 0.4 10*3/uL (ref 0.1–0.9)
MONO%: 19.9 % — AB (ref 0.0–14.0)
NEUT%: 40.4 % (ref 39.0–75.0)
NEUTROS ABS: 0.8 10*3/uL — AB (ref 1.5–6.5)
Platelets: 116 10*3/uL — ABNORMAL LOW (ref 140–400)
RBC: 4.61 10*6/uL (ref 4.20–5.82)
RDW: 29.8 % — ABNORMAL HIGH (ref 11.0–14.6)
WBC: 2 10*3/uL — AB (ref 4.0–10.3)
lymph#: 0.7 10*3/uL — ABNORMAL LOW (ref 0.9–3.3)
nRBC: 0 % (ref 0–0)

## 2015-08-16 LAB — COMPREHENSIVE METABOLIC PANEL
ALBUMIN: 2.8 g/dL — AB (ref 3.5–5.0)
ALK PHOS: 99 U/L (ref 40–150)
ALT: 10 U/L (ref 0–55)
AST: 23 U/L (ref 5–34)
Anion Gap: 9 mEq/L (ref 3–11)
BUN: 9.2 mg/dL (ref 7.0–26.0)
CO2: 24 mEq/L (ref 22–29)
Calcium: 8.6 mg/dL (ref 8.4–10.4)
Chloride: 107 mEq/L (ref 98–109)
Creatinine: 0.9 mg/dL (ref 0.7–1.3)
GLUCOSE: 107 mg/dL (ref 70–140)
POTASSIUM: 3 meq/L — AB (ref 3.5–5.1)
SODIUM: 141 meq/L (ref 136–145)
Total Bilirubin: 0.34 mg/dL (ref 0.20–1.20)
Total Protein: 6.2 g/dL — ABNORMAL LOW (ref 6.4–8.3)

## 2015-08-16 LAB — TECHNOLOGIST REVIEW

## 2015-08-16 MED ORDER — SODIUM CHLORIDE 0.9% FLUSH
10.0000 mL | INTRAVENOUS | Status: DC | PRN
  Administered 2015-08-16: 10 mL via INTRAVENOUS
  Filled 2015-08-16: qty 10

## 2015-08-16 MED ORDER — HEPARIN SOD (PORK) LOCK FLUSH 100 UNIT/ML IV SOLN
500.0000 [IU] | Freq: Once | INTRAVENOUS | Status: AC
  Administered 2015-08-16: 500 [IU] via INTRAVENOUS
  Filled 2015-08-16: qty 5

## 2015-08-16 MED ORDER — POTASSIUM CHLORIDE CRYS ER 20 MEQ PO TBCR: 20.0000 meq | EXTENDED_RELEASE_TABLET | Freq: Every day | ORAL | Status: DC

## 2015-08-16 NOTE — Patient Instructions (Signed)

## 2015-08-16 NOTE — Telephone Encounter (Signed)
Per staff message and POF I have scheduled appts. Advised scheduler of appts. JMW  

## 2015-08-16 NOTE — Progress Notes (Signed)
  [place] OFFICE PROGRESS NOTE   Diagnosis: Gastric cancer  INTERVAL HISTORY:   Mr. Kuhner returns as scheduled. He completed another cycle of FOLFOX 08/02/2015. He reports tolerating the chemotherapy well. No nausea/vomiting, diarrhea, or neuropathy symptoms. He is constipated.  Objective:  Vital signs in last 24 hours:  Blood pressure 145/60, pulse 85, temperature 98.2 F (36.8 C), temperature source Oral, resp. rate 19, height '6\' 2"'$  (1.88 m), weight 220 lb 1.6 oz (99.837 kg), SpO2 95 %.    HEENT: No thrush or ulcers Resp: Lungs clear bilaterally Cardio: Regular rate and rhythm GI: No hepatomegaly, no mass, nontender Vascular: No leg edema  Skin: Mild hyperpigmentation of the palms   Portacath/PICC-without erythema  Lab Results:  Lab Results  Component Value Date   WBC 2.0* 08/16/2015   HGB 9.6* 08/16/2015   HCT 32.2* 08/16/2015   MCV 69.8* 08/16/2015   PLT 116* 08/16/2015   NEUTROABS 0.8* 08/16/2015   Potassium 3.0, BUN 9.2, crowding 0.9  Medications: I have reviewed the patient's current medications.  Assessment/Plan: 1. Gastric Cancer  Lesser curvature gastric body mass noted on endoscopy 06/27/2015, biopsy confirmed adenocarcinoma  Staging CT scans to 06/28/2015 with a gastric body mass, adenopathy adjacent to the stomach, chest adenopathy, a single enhancing liver lesion, and indeterminate nodular pulmonary lesions  PET scan 07/12/2015-hypermetabolic gastric mass, gastrohepatic ligament metastases, metastasis at the left adrenal gland, hypermetabolic right paratracheal, right hilar, and subcarinal nodes moderate metabolic activity involving 2 pulmonary lesions  Cycle 1 FOLFOX 07/19/2015  Cycle 2 FOLFOX 08/02/2015  2. COPD  3. Nonischemic cardiomyopathy  4. Implanted cardiac defibrillator  5. History of prostate cancer  6. Hypertension  7. Iron deficiency anemia  8.    Neutropenia secondary to  chemotherapy-Neulasta will be added with cycle 3 FOLFOX 08/23/2015   Disposition:  Mr. Braley continues to tolerate the chemotherapy well. He has developed moderate neutropenia. Chemotherapy will be held today. Neulasta will be added with cycle 3 FOLFOX on 08/23/2015. We reviewed the potential toxicities associated with Neulasta and he agrees to proceed. He will begin a potassium supplement.  Betsy Coder, MD  08/16/2015  10:30 AM

## 2015-08-16 NOTE — Telephone Encounter (Signed)
per pof to sch pt appt-MW sch trmt-gave pt copy of avs

## 2015-08-19 DIAGNOSIS — C169 Malignant neoplasm of stomach, unspecified: Secondary | ICD-10-CM | POA: Diagnosis not present

## 2015-08-20 NOTE — Assessment & Plan Note (Signed)
Well-controlled at this visit without right-sided failure signs

## 2015-08-20 NOTE — Assessment & Plan Note (Signed)
Adequate control. Mild chronic bronchitis component. Plan-try replacing Dulera with Breo because of insurance. Refill cough syrup with discussion

## 2015-08-23 ENCOUNTER — Other Ambulatory Visit (HOSPITAL_BASED_OUTPATIENT_CLINIC_OR_DEPARTMENT_OTHER): Payer: Commercial Managed Care - HMO

## 2015-08-23 ENCOUNTER — Ambulatory Visit (HOSPITAL_BASED_OUTPATIENT_CLINIC_OR_DEPARTMENT_OTHER): Payer: Commercial Managed Care - HMO

## 2015-08-23 ENCOUNTER — Other Ambulatory Visit: Payer: Self-pay | Admitting: Internal Medicine

## 2015-08-23 ENCOUNTER — Ambulatory Visit: Payer: Commercial Managed Care - HMO | Admitting: Nutrition

## 2015-08-23 VITALS — BP 153/71 | HR 71 | Temp 97.2°F | Resp 18

## 2015-08-23 DIAGNOSIS — C162 Malignant neoplasm of body of stomach: Secondary | ICD-10-CM

## 2015-08-23 DIAGNOSIS — Z5111 Encounter for antineoplastic chemotherapy: Secondary | ICD-10-CM | POA: Diagnosis not present

## 2015-08-23 DIAGNOSIS — C169 Malignant neoplasm of stomach, unspecified: Secondary | ICD-10-CM | POA: Diagnosis not present

## 2015-08-23 DIAGNOSIS — Z95828 Presence of other vascular implants and grafts: Secondary | ICD-10-CM

## 2015-08-23 DIAGNOSIS — Z452 Encounter for adjustment and management of vascular access device: Secondary | ICD-10-CM

## 2015-08-23 LAB — CBC WITH DIFFERENTIAL/PLATELET
BASO%: 0.9 % (ref 0.0–2.0)
BASOS ABS: 0 10*3/uL (ref 0.0–0.1)
EOS%: 0.6 % (ref 0.0–7.0)
Eosinophils Absolute: 0 10*3/uL (ref 0.0–0.5)
HCT: 36.5 % — ABNORMAL LOW (ref 38.4–49.9)
HGB: 11.2 g/dL — ABNORMAL LOW (ref 13.0–17.1)
LYMPH%: 32.5 % (ref 14.0–49.0)
MCH: 21.6 pg — AB (ref 27.2–33.4)
MCHC: 30.6 g/dL — ABNORMAL LOW (ref 32.0–36.0)
MCV: 70.4 fL — AB (ref 79.3–98.0)
MONO#: 0.6 10*3/uL (ref 0.1–0.9)
MONO%: 19.7 % — AB (ref 0.0–14.0)
NEUT#: 1.4 10*3/uL — ABNORMAL LOW (ref 1.5–6.5)
NEUT%: 46.3 % (ref 39.0–75.0)
PLATELETS: 162 10*3/uL (ref 140–400)
RBC: 5.19 10*6/uL (ref 4.20–5.82)
RDW: 35.3 % — ABNORMAL HIGH (ref 11.0–14.6)
WBC: 3.1 10*3/uL — AB (ref 4.0–10.3)
lymph#: 1 10*3/uL (ref 0.9–3.3)

## 2015-08-23 LAB — BASIC METABOLIC PANEL
ANION GAP: 8 meq/L (ref 3–11)
BUN: 8.9 mg/dL (ref 7.0–26.0)
CALCIUM: 9.1 mg/dL (ref 8.4–10.4)
CO2: 26 meq/L (ref 22–29)
Chloride: 108 mEq/L (ref 98–109)
Creatinine: 1 mg/dL (ref 0.7–1.3)
EGFR: 87 mL/min/{1.73_m2} — AB (ref >90)
Glucose: 94 mg/dl (ref 70–140)
Potassium: 3.8 mEq/L (ref 3.5–5.1)
SODIUM: 142 meq/L (ref 136–145)

## 2015-08-23 MED ORDER — FLUOROURACIL CHEMO INJECTION 5 GM/100ML
2400.0000 mg/m2 | INTRAVENOUS | Status: DC
  Administered 2015-08-23: 5450 mg via INTRAVENOUS
  Filled 2015-08-23: qty 109

## 2015-08-23 MED ORDER — DEXTROSE 5 % IV SOLN
Freq: Once | INTRAVENOUS | Status: AC
  Administered 2015-08-23: 10:00:00 via INTRAVENOUS

## 2015-08-23 MED ORDER — DEXTROSE 5 % IV SOLN
395.0000 mg/m2 | Freq: Once | INTRAVENOUS | Status: AC
  Administered 2015-08-23: 900 mg via INTRAVENOUS
  Filled 2015-08-23: qty 45

## 2015-08-23 MED ORDER — OXALIPLATIN CHEMO INJECTION 100 MG/20ML
88.0000 mg/m2 | Freq: Once | INTRAVENOUS | Status: AC
  Administered 2015-08-23: 200 mg via INTRAVENOUS
  Filled 2015-08-23: qty 40

## 2015-08-23 MED ORDER — DEXAMETHASONE SODIUM PHOSPHATE 100 MG/10ML IJ SOLN
10.0000 mg | Freq: Once | INTRAMUSCULAR | Status: AC
  Administered 2015-08-23: 10 mg via INTRAVENOUS
  Filled 2015-08-23: qty 1

## 2015-08-23 MED ORDER — PALONOSETRON HCL INJECTION 0.25 MG/5ML
0.2500 mg | Freq: Once | INTRAVENOUS | Status: AC
  Administered 2015-08-23: 0.25 mg via INTRAVENOUS

## 2015-08-23 MED ORDER — PALONOSETRON HCL INJECTION 0.25 MG/5ML
INTRAVENOUS | Status: AC
  Filled 2015-08-23: qty 5

## 2015-08-23 MED ORDER — FLUOROURACIL CHEMO INJECTION 2.5 GM/50ML
400.0000 mg/m2 | Freq: Once | INTRAVENOUS | Status: AC
  Administered 2015-08-23: 900 mg via INTRAVENOUS
  Filled 2015-08-23: qty 18

## 2015-08-23 MED ORDER — SODIUM CHLORIDE 0.9% FLUSH
10.0000 mL | INTRAVENOUS | Status: DC | PRN
  Administered 2015-08-23: 10 mL via INTRAVENOUS
  Filled 2015-08-23: qty 10

## 2015-08-23 NOTE — Progress Notes (Signed)
OK to treat with ANC 1.4 today, CMET result from 4/13- per Dr. [name]. Will check BMET to follow up hypokalemia.

## 2015-08-23 NOTE — Patient Instructions (Addendum)
[facility] Discharge Instructions for Patients Receiving Chemotherapy  Today you received the following chemotherapy agents Oxaliplatin, Leucovorin and Fluorouracil.  To help prevent nausea and vomiting after your treatment, we encourage you to take your nausea medication as directed. NO ZOFRAN FOR 3 DAYS.  If you develop nausea and vomiting that is not controlled by your nausea medication, call the clinic.   BELOW ARE SYMPTOMS THAT SHOULD BE REPORTED IMMEDIATELY:  *FEVER GREATER THAN 100.5 F  *CHILLS WITH OR WITHOUT FEVER  NAUSEA AND VOMITING THAT IS NOT CONTROLLED WITH YOUR NAUSEA MEDICATION  *UNUSUAL SHORTNESS OF BREATH  *UNUSUAL BRUISING OR BLEEDING  TENDERNESS IN MOUTH AND THROAT WITH OR WITHOUT PRESENCE OF ULCERS  *URINARY PROBLEMS  *BOWEL PROBLEMS  UNUSUAL RASH Items with * indicate a potential emergency and should be followed up as soon as possible.  Feel free to call the clinic you have any questions or concerns. The clinic phone number is [phone].  Please show the [place] at check-in to the Emergency Department and triage nurse.   Pegfilgrastim injection What is this medicine? PEGFILGRASTIM (PEG fil gra stim) is a long-acting granulocyte colony-stimulating factor that stimulates the growth of neutrophils, a type of white blood cell important in the body's fight against infection. It is used to reduce the incidence of fever and infection in patients with certain types of cancer who are receiving chemotherapy that affects the bone marrow, and to increase survival after being exposed to high doses of radiation. This medicine may be used for other purposes; ask your health care provider or pharmacist if you have questions. What should I tell my health care provider before I take this medicine? They need to know if you have any of these conditions: -kidney disease -latex allergy -ongoing radiation therapy -sickle cell disease -skin  reactions to acrylic adhesives (On-Body Injector only) -an unusual or allergic reaction to pegfilgrastim, filgrastim, other medicines, foods, dyes, or preservatives -pregnant or trying to get pregnant -breast-feeding How should I use this medicine? This medicine is for injection under the skin. If you get this medicine at home, you will be taught how to prepare and give the pre-filled syringe or how to use the On-body Injector. Refer to the patient Instructions for Use for detailed instructions. Use exactly as directed. Take your medicine at regular intervals. Do not take your medicine more often than directed. It is important that you put your used needles and syringes in a special sharps container. Do not put them in a trash can. If you do not have a sharps container, call your pharmacist or healthcare provider to get one. Talk to your pediatrician regarding the use of this medicine in children. While this drug may be prescribed for selected conditions, precautions do apply. Overdosage: If you think you have taken too much of this medicine contact a poison control center or emergency room at once. NOTE: This medicine is only for you. Do not share this medicine with others. What if I miss a dose? It is important not to miss your dose. Call your doctor or health care professional if you miss your dose. If you miss a dose due to an On-body Injector failure or leakage, a new dose should be administered as soon as possible using a single prefilled syringe for manual use. What may interact with this medicine? Interactions have not been studied. Give your health care provider a list of all the medicines, herbs, non-prescription drugs, or dietary supplements you use. Also  tell them if you smoke, drink alcohol, or use illegal drugs. Some items may interact with your medicine. This list may not describe all possible interactions. Give your health care provider a list of all the medicines, herbs,  non-prescription drugs, or dietary supplements you use. Also tell them if you smoke, drink alcohol, or use illegal drugs. Some items may interact with your medicine. What should I watch for while using this medicine? You may need blood work done while you are taking this medicine. If you are going to need a MRI, CT scan, or other procedure, tell your doctor that you are using this medicine (On-Body Injector only). What side effects may I notice from receiving this medicine? Side effects that you should report to your doctor or health care professional as soon as possible: -allergic reactions like skin rash, itching or hives, swelling of the face, lips, or tongue -dizziness -fever -pain, redness, or irritation at site where injected -pinpoint red spots on the skin -red or dark-brown urine -shortness of breath or breathing problems -stomach or side pain, or pain at the shoulder -swelling -tiredness -trouble passing urine or change in the amount of urine Side effects that usually do not require medical attention (report to your doctor or health care professional if they continue or are bothersome): -bone pain -muscle pain This list may not describe all possible side effects. Call your doctor for medical advice about side effects. You may report side effects to FDA at 1-800-FDA-1088. Where should I keep my medicine? Keep out of the reach of children. Store pre-filled syringes in a refrigerator between 2 and 8 degrees C (36 and 46 degrees F). Do not freeze. Keep in carton to protect from light. Throw away this medicine if it is left out of the refrigerator for more than 48 hours. Throw away any unused medicine after the expiration date. NOTE: This sheet is a summary. It may not cover all possible information. If you have questions about this medicine, talk to your doctor, pharmacist, or health care provider.    2016, Elsevier/Gold Standard. (2014-05-11 14:30:14)

## 2015-08-23 NOTE — Progress Notes (Signed)
Nutrition follow-up completed with patient diagnosed with gastric cancer. Weight has improved overall with last weight documented as 220.1 pounds April 13. Patient reports he is no longer trying to lose weight and is trying to eat more often. Patient is trying to snack.  Nutrition diagnosis:  Food and nutrition related knowledge deficit improved.  Intervention:  Patient educated to continue small frequent meals and snacks with adequate calories and protein. Provided support and encouragement. Teach back method used.  Monitoring, evaluation, goals:  Patient has increased calories and protein, and is striving for weight maintenance.  Next visit: To be scheduled as needed.  **Disclaimer: This note was dictated with voice recognition software. Similar sounding words can inadvertently be transcribed and this note may contain transcription errors which may not have been corrected upon publication of note.**

## 2015-08-23 NOTE — Progress Notes (Signed)
IR contacted in regards to tip placement of port-a-cath. Dr. Jacqualyn Posey, radiologist confirmed tip in the SVC per chest x-ray.

## 2015-08-23 NOTE — Patient Instructions (Signed)

## 2015-08-23 NOTE — Progress Notes (Signed)
Ok to treat with ANC of 1.4 today per Lavella Lemons RN and do not need to wait for BMP results to proceed.

## 2015-08-25 ENCOUNTER — Ambulatory Visit (HOSPITAL_BASED_OUTPATIENT_CLINIC_OR_DEPARTMENT_OTHER): Payer: Commercial Managed Care - HMO

## 2015-08-25 ENCOUNTER — Ambulatory Visit: Payer: Commercial Managed Care - HMO

## 2015-08-25 DIAGNOSIS — C162 Malignant neoplasm of body of stomach: Secondary | ICD-10-CM

## 2015-08-25 DIAGNOSIS — Z5189 Encounter for other specified aftercare: Secondary | ICD-10-CM | POA: Diagnosis not present

## 2015-08-25 MED ORDER — HEPARIN SOD (PORK) LOCK FLUSH 100 UNIT/ML IV SOLN
500.0000 [IU] | Freq: Once | INTRAVENOUS | Status: AC | PRN
  Administered 2015-08-25: 500 [IU]
  Filled 2015-08-25: qty 5

## 2015-08-25 MED ORDER — PEGFILGRASTIM INJECTION 6 MG/0.6ML ~~LOC~~
6.0000 mg | PREFILLED_SYRINGE | Freq: Once | SUBCUTANEOUS | Status: AC
  Administered 2015-08-25: 6 mg via SUBCUTANEOUS

## 2015-08-25 MED ORDER — SODIUM CHLORIDE 0.9% FLUSH
10.0000 mL | INTRAVENOUS | Status: DC | PRN
  Administered 2015-08-25: 10 mL
  Filled 2015-08-25: qty 10

## 2015-08-25 NOTE — Patient Instructions (Signed)
Pegfilgrastim injection What is this medicine? PEGFILGRASTIM (PEG fil gra stim) is a long-acting granulocyte colony-stimulating factor that stimulates the growth of neutrophils, a type of white blood cell important in the body's fight against infection. It is used to reduce the incidence of fever and infection in patients with certain types of cancer who are receiving chemotherapy that affects the bone marrow, and to increase survival after being exposed to high doses of radiation. This medicine may be used for other purposes; ask your health care provider or pharmacist if you have questions. What should I tell my health care provider before I take this medicine? They need to know if you have any of these conditions: -kidney disease -latex allergy -ongoing radiation therapy -sickle cell disease -skin reactions to acrylic adhesives (On-Body Injector only) -an unusual or allergic reaction to pegfilgrastim, filgrastim, other medicines, foods, dyes, or preservatives -pregnant or trying to get pregnant -breast-feeding How should I use this medicine? This medicine is for injection under the skin. If you get this medicine at home, you will be taught how to prepare and give the pre-filled syringe or how to use the On-body Injector. Refer to the patient Instructions for Use for detailed instructions. Use exactly as directed. Take your medicine at regular intervals. Do not take your medicine more often than directed. It is important that you put your used needles and syringes in a special sharps container. Do not put them in a trash can. If you do not have a sharps container, call your pharmacist or healthcare provider to get one. Talk to your pediatrician regarding the use of this medicine in children. While this drug may be prescribed for selected conditions, precautions do apply. Overdosage: If you think you have taken too much of this medicine contact a poison control center or emergency room at  once. NOTE: This medicine is only for you. Do not share this medicine with others. What if I miss a dose? It is important not to miss your dose. Call your doctor or health care professional if you miss your dose. If you miss a dose due to an On-body Injector failure or leakage, a new dose should be administered as soon as possible using a single prefilled syringe for manual use. What may interact with this medicine? Interactions have not been studied. Give your health care provider a list of all the medicines, herbs, non-prescription drugs, or dietary supplements you use. Also tell them if you smoke, drink alcohol, or use illegal drugs. Some items may interact with your medicine. This list may not describe all possible interactions. Give your health care provider a list of all the medicines, herbs, non-prescription drugs, or dietary supplements you use. Also tell them if you smoke, drink alcohol, or use illegal drugs. Some items may interact with your medicine. What should I watch for while using this medicine? You may need blood work done while you are taking this medicine. If you are going to need a MRI, CT scan, or other procedure, tell your doctor that you are using this medicine (On-Body Injector only). What side effects may I notice from receiving this medicine? Side effects that you should report to your doctor or health care professional as soon as possible: -allergic reactions like skin rash, itching or hives, swelling of the face, lips, or tongue -dizziness -fever -pain, redness, or irritation at site where injected -pinpoint red spots on the skin -red or dark-brown urine -shortness of breath or breathing problems -stomach or side pain, or pain   at the shoulder -swelling -tiredness -trouble passing urine or change in the amount of urine Side effects that usually do not require medical attention (report to your doctor or health care professional if they continue or are  bothersome): -bone pain -muscle pain This list may not describe all possible side effects. Call your doctor for medical advice about side effects. You may report side effects to FDA at 1-800-FDA-1088. Where should I keep my medicine? Keep out of the reach of children. Store pre-filled syringes in a refrigerator between 2 and 8 degrees C (36 and 46 degrees F). Do not freeze. Keep in carton to protect from light. Throw away this medicine if it is left out of the refrigerator for more than 48 hours. Throw away any unused medicine after the expiration date. NOTE: This sheet is a summary. It may not cover all possible information. If you have questions about this medicine, talk to your doctor, pharmacist, or health care provider.    2016, Elsevier/Gold Standard. (2014-05-11 14:30:14)  

## 2015-08-25 NOTE — Progress Notes (Signed)
Injection given after pump D/C

## 2015-08-30 ENCOUNTER — Ambulatory Visit: Payer: Commercial Managed Care - HMO

## 2015-08-30 ENCOUNTER — Other Ambulatory Visit: Payer: Commercial Managed Care - HMO

## 2015-08-30 ENCOUNTER — Encounter: Payer: Commercial Managed Care - HMO | Admitting: Nutrition

## 2015-08-30 ENCOUNTER — Ambulatory Visit: Payer: Commercial Managed Care - HMO | Admitting: Nurse Practitioner

## 2015-09-02 ENCOUNTER — Other Ambulatory Visit: Payer: Self-pay | Admitting: Oncology

## 2015-09-06 ENCOUNTER — Ambulatory Visit (HOSPITAL_BASED_OUTPATIENT_CLINIC_OR_DEPARTMENT_OTHER): Payer: Commercial Managed Care - HMO | Admitting: Nurse Practitioner

## 2015-09-06 ENCOUNTER — Ambulatory Visit (HOSPITAL_BASED_OUTPATIENT_CLINIC_OR_DEPARTMENT_OTHER): Payer: Commercial Managed Care - HMO

## 2015-09-06 ENCOUNTER — Other Ambulatory Visit: Payer: Self-pay

## 2015-09-06 ENCOUNTER — Other Ambulatory Visit (HOSPITAL_BASED_OUTPATIENT_CLINIC_OR_DEPARTMENT_OTHER): Payer: Commercial Managed Care - HMO

## 2015-09-06 VITALS — BP 149/73 | HR 80 | Temp 97.9°F | Resp 18 | Ht 74.0 in | Wt 214.4 lb

## 2015-09-06 DIAGNOSIS — I10 Essential (primary) hypertension: Secondary | ICD-10-CM

## 2015-09-06 DIAGNOSIS — D701 Agranulocytosis secondary to cancer chemotherapy: Secondary | ICD-10-CM

## 2015-09-06 DIAGNOSIS — Z8546 Personal history of malignant neoplasm of prostate: Secondary | ICD-10-CM | POA: Diagnosis not present

## 2015-09-06 DIAGNOSIS — Z5111 Encounter for antineoplastic chemotherapy: Secondary | ICD-10-CM | POA: Diagnosis not present

## 2015-09-06 DIAGNOSIS — C162 Malignant neoplasm of body of stomach: Secondary | ICD-10-CM

## 2015-09-06 DIAGNOSIS — D509 Iron deficiency anemia, unspecified: Secondary | ICD-10-CM | POA: Diagnosis not present

## 2015-09-06 DIAGNOSIS — C7971 Secondary malignant neoplasm of right adrenal gland: Secondary | ICD-10-CM | POA: Diagnosis not present

## 2015-09-06 DIAGNOSIS — C169 Malignant neoplasm of stomach, unspecified: Secondary | ICD-10-CM

## 2015-09-06 DIAGNOSIS — Z95828 Presence of other vascular implants and grafts: Secondary | ICD-10-CM

## 2015-09-06 DIAGNOSIS — Z452 Encounter for adjustment and management of vascular access device: Secondary | ICD-10-CM | POA: Diagnosis not present

## 2015-09-06 LAB — CBC WITH DIFFERENTIAL/PLATELET
BASO%: 0.1 % (ref 0.0–2.0)
Basophils Absolute: 0 10*3/uL (ref 0.0–0.1)
EOS%: 0.5 % (ref 0.0–7.0)
Eosinophils Absolute: 0 10*3/uL (ref 0.0–0.5)
HEMATOCRIT: 38.6 % (ref 38.4–49.9)
HGB: 11.9 g/dL — ABNORMAL LOW (ref 13.0–17.1)
LYMPH%: 13.7 % — AB (ref 14.0–49.0)
MCH: 22.9 pg — AB (ref 27.2–33.4)
MCHC: 30.8 g/dL — AB (ref 32.0–36.0)
MCV: 74.4 fL — ABNORMAL LOW (ref 79.3–98.0)
MONO#: 1 10*3/uL — AB (ref 0.1–0.9)
MONO%: 12.9 % (ref 0.0–14.0)
NEUT%: 72.8 % (ref 39.0–75.0)
NEUTROS ABS: 5.8 10*3/uL (ref 1.5–6.5)
Platelets: 124 10*3/uL — ABNORMAL LOW (ref 140–400)
RBC: 5.19 10*6/uL (ref 4.20–5.82)
RDW: 30.2 % — ABNORMAL HIGH (ref 11.0–14.6)
WBC: 8 10*3/uL (ref 4.0–10.3)
lymph#: 1.1 10*3/uL (ref 0.9–3.3)
nRBC: 0 % (ref 0–0)

## 2015-09-06 LAB — COMPREHENSIVE METABOLIC PANEL
ALT: 33 U/L (ref 0–55)
AST: 38 U/L — AB (ref 5–34)
Albumin: 3.3 g/dL — ABNORMAL LOW (ref 3.5–5.0)
Alkaline Phosphatase: 154 U/L — ABNORMAL HIGH (ref 40–150)
Anion Gap: 8 mEq/L (ref 3–11)
BUN: 11.1 mg/dL (ref 7.0–26.0)
CHLORIDE: 109 meq/L (ref 98–109)
CO2: 23 meq/L (ref 22–29)
CREATININE: 1 mg/dL (ref 0.7–1.3)
Calcium: 9 mg/dL (ref 8.4–10.4)
EGFR: 88 mL/min/{1.73_m2} — ABNORMAL LOW (ref >90)
GLUCOSE: 97 mg/dL (ref 70–140)
POTASSIUM: 3.9 meq/L (ref 3.5–5.1)
SODIUM: 140 meq/L (ref 136–145)
Total Bilirubin: <0.3 mg/dL (ref 0.20–1.20)
Total Protein: 6.8 g/dL (ref 6.4–8.3)

## 2015-09-06 MED ORDER — OXALIPLATIN CHEMO INJECTION 100 MG/20ML
87.0000 mg/m2 | Freq: Once | INTRAVENOUS | Status: AC
  Administered 2015-09-06: 200 mg via INTRAVENOUS
  Filled 2015-09-06: qty 40

## 2015-09-06 MED ORDER — SODIUM CHLORIDE 0.9 % IV SOLN
10.0000 mg | Freq: Once | INTRAVENOUS | Status: AC
  Administered 2015-09-06: 10 mg via INTRAVENOUS
  Filled 2015-09-06: qty 1

## 2015-09-06 MED ORDER — SODIUM CHLORIDE 0.9 % IV SOLN
2400.0000 mg/m2 | INTRAVENOUS | Status: DC
  Administered 2015-09-06: 5450 mg via INTRAVENOUS
  Filled 2015-09-06: qty 109

## 2015-09-06 MED ORDER — SODIUM CHLORIDE 0.9 % IJ SOLN
10.0000 mL | INTRAMUSCULAR | Status: DC | PRN
  Administered 2015-09-06: 10 mL via INTRAVENOUS
  Filled 2015-09-06: qty 10

## 2015-09-06 MED ORDER — FLUOROURACIL CHEMO INJECTION 2.5 GM/50ML
400.0000 mg/m2 | Freq: Once | INTRAVENOUS | Status: AC
  Administered 2015-09-06: 900 mg via INTRAVENOUS
  Filled 2015-09-06: qty 18

## 2015-09-06 MED ORDER — LEUCOVORIN CALCIUM INJECTION 350 MG
395.0000 mg/m2 | Freq: Once | INTRAVENOUS | Status: AC
  Administered 2015-09-06: 900 mg via INTRAVENOUS
  Filled 2015-09-06: qty 45

## 2015-09-06 MED ORDER — DEXTROSE 5 % IV SOLN
Freq: Once | INTRAVENOUS | Status: AC
  Administered 2015-09-06: 11:00:00 via INTRAVENOUS

## 2015-09-06 MED ORDER — PALONOSETRON HCL INJECTION 0.25 MG/5ML
INTRAVENOUS | Status: AC
  Filled 2015-09-06: qty 5

## 2015-09-06 MED ORDER — PALONOSETRON HCL INJECTION 0.25 MG/5ML
0.2500 mg | Freq: Once | INTRAVENOUS | Status: AC
  Administered 2015-09-06: 0.25 mg via INTRAVENOUS

## 2015-09-06 NOTE — Patient Instructions (Signed)

## 2015-09-06 NOTE — Progress Notes (Signed)
Pt declined AVS, he did ask for copy of labs

## 2015-09-06 NOTE — Progress Notes (Signed)
  [facility] OFFICE PROGRESS NOTE   Diagnosis:  Gastric cancer  INTERVAL HISTORY:   [name] returns as scheduled. He completed cycle 3 FOLFOX on 08/23/2015 with Neulasta support. He denies nausea/vomiting. No mouth sores. No diarrhea. He has mild persistent cold sensitivity. No bone pain following Neulasta.  Objective:  Vital signs in last 24 hours:  Blood pressure 149/73, pulse 80, temperature 97.9 F (36.6 C), temperature source Oral, resp. rate 18, height '6\' 2"'$  (1.88 m), weight 214 lb 6.4 oz (97.251 kg), SpO2 96 %.    HEENT: No thrush or ulcers. Resp: Lungs clear bilaterally. Cardio: Regular rate and rhythm. GI: Abdomen soft and nontender. No hepatomegaly. Vascular: Trace lower leg edema bilaterally. Calves soft and nontender. Neuro: Vibratory sense mildly decreased over the fingertips per tuning fork exam.  Skin: Palms with mild hyperpigmentation. Port-A-Cath without erythema.    Lab Results:  Lab Results  Component Value Date   WBC 3.1* 08/23/2015   HGB 11.2* 08/23/2015   HCT 36.5* 08/23/2015   MCV 70.4* 08/23/2015   PLT 162 08/23/2015   NEUTROABS 1.4* 08/23/2015    Imaging:  No results found.  Medications: I have reviewed the patient's current medications.  Assessment/Plan: 1. Gastric Cancer  Lesser curvature gastric body mass noted on endoscopy 06/27/2015, biopsy confirmed adenocarcinoma  Staging CT scans to 06/28/2015 with a gastric body mass, adenopathy adjacent to the stomach, chest adenopathy, a single enhancing liver lesion, and indeterminate nodular pulmonary lesions  PET scan 07/12/2015-hypermetabolic gastric mass, gastrohepatic ligament metastases, metastasis at the left adrenal gland, hypermetabolic right paratracheal, right hilar, and subcarinal nodes moderate metabolic activity involving 2 pulmonary lesions  Cycle 1 FOLFOX 07/19/2015  Cycle 2 FOLFOX 08/02/2015  Cycle 3 FOLFOX 08/23/2015 with Neulasta support  Cycle 4  FOLFOX 09/06/2015 with Neulasta support  2. COPD  3. Nonischemic cardiomyopathy  4. Implanted cardiac defibrillator  5. History of prostate cancer  6. Hypertension  7. Iron deficiency anemia  8. Neutropenia secondary to chemotherapy-Neulasta added with cycle 3 FOLFOX 08/23/2015   Disposition: [name] appears stable. He has completed 3 cycles of FOLFOX. Plan to proceed with cycle 4 today as scheduled. He will return for a follow-up visit and cycle 5 in 2 weeks. He will contact the office in the interim with any problems.    Ned Card ANP/GNP-BC   09/06/2015  9:59 AM

## 2015-09-08 ENCOUNTER — Ambulatory Visit (HOSPITAL_BASED_OUTPATIENT_CLINIC_OR_DEPARTMENT_OTHER): Payer: Commercial Managed Care - HMO

## 2015-09-08 ENCOUNTER — Ambulatory Visit: Payer: Commercial Managed Care - HMO

## 2015-09-08 VITALS — BP 132/58 | HR 85 | Temp 98.4°F | Resp 16

## 2015-09-08 DIAGNOSIS — D701 Agranulocytosis secondary to cancer chemotherapy: Secondary | ICD-10-CM | POA: Diagnosis not present

## 2015-09-08 DIAGNOSIS — C162 Malignant neoplasm of body of stomach: Secondary | ICD-10-CM

## 2015-09-08 MED ORDER — PEGFILGRASTIM INJECTION 6 MG/0.6ML ~~LOC~~
6.0000 mg | PREFILLED_SYRINGE | Freq: Once | SUBCUTANEOUS | Status: AC
  Administered 2015-09-08: 6 mg via SUBCUTANEOUS

## 2015-09-08 MED ORDER — SODIUM CHLORIDE 0.9% FLUSH
10.0000 mL | INTRAVENOUS | Status: DC | PRN
  Administered 2015-09-08: 10 mL
  Filled 2015-09-08: qty 10

## 2015-09-08 MED ORDER — HEPARIN SOD (PORK) LOCK FLUSH 100 UNIT/ML IV SOLN
500.0000 [IU] | Freq: Once | INTRAVENOUS | Status: AC | PRN
  Administered 2015-09-08: 500 [IU]
  Filled 2015-09-08: qty 5

## 2015-09-08 NOTE — Progress Notes (Signed)
Pump d/c/injection, VSS, pt declined AVS

## 2015-09-16 ENCOUNTER — Other Ambulatory Visit: Payer: Self-pay | Admitting: Oncology

## 2015-09-18 DIAGNOSIS — C169 Malignant neoplasm of stomach, unspecified: Secondary | ICD-10-CM | POA: Diagnosis not present

## 2015-09-20 ENCOUNTER — Ambulatory Visit (HOSPITAL_BASED_OUTPATIENT_CLINIC_OR_DEPARTMENT_OTHER): Payer: Commercial Managed Care - HMO | Admitting: Nurse Practitioner

## 2015-09-20 ENCOUNTER — Encounter: Payer: Self-pay | Admitting: *Deleted

## 2015-09-20 ENCOUNTER — Telehealth: Payer: Self-pay | Admitting: *Deleted

## 2015-09-20 ENCOUNTER — Ambulatory Visit (HOSPITAL_BASED_OUTPATIENT_CLINIC_OR_DEPARTMENT_OTHER): Payer: Commercial Managed Care - HMO

## 2015-09-20 ENCOUNTER — Telehealth: Payer: Self-pay | Admitting: Nurse Practitioner

## 2015-09-20 ENCOUNTER — Other Ambulatory Visit (HOSPITAL_BASED_OUTPATIENT_CLINIC_OR_DEPARTMENT_OTHER): Payer: Commercial Managed Care - HMO

## 2015-09-20 VITALS — BP 145/73 | HR 80 | Temp 98.0°F | Resp 19 | Ht 74.0 in | Wt 214.3 lb

## 2015-09-20 DIAGNOSIS — C169 Malignant neoplasm of stomach, unspecified: Secondary | ICD-10-CM | POA: Diagnosis not present

## 2015-09-20 DIAGNOSIS — D701 Agranulocytosis secondary to cancer chemotherapy: Secondary | ICD-10-CM

## 2015-09-20 DIAGNOSIS — C162 Malignant neoplasm of body of stomach: Secondary | ICD-10-CM

## 2015-09-20 DIAGNOSIS — Z452 Encounter for adjustment and management of vascular access device: Secondary | ICD-10-CM | POA: Diagnosis not present

## 2015-09-20 DIAGNOSIS — D509 Iron deficiency anemia, unspecified: Secondary | ICD-10-CM | POA: Diagnosis not present

## 2015-09-20 DIAGNOSIS — Z8546 Personal history of malignant neoplasm of prostate: Secondary | ICD-10-CM

## 2015-09-20 DIAGNOSIS — Z95828 Presence of other vascular implants and grafts: Secondary | ICD-10-CM

## 2015-09-20 DIAGNOSIS — C7972 Secondary malignant neoplasm of left adrenal gland: Secondary | ICD-10-CM

## 2015-09-20 DIAGNOSIS — I10 Essential (primary) hypertension: Secondary | ICD-10-CM

## 2015-09-20 DIAGNOSIS — Z5111 Encounter for antineoplastic chemotherapy: Secondary | ICD-10-CM

## 2015-09-20 LAB — CBC WITH DIFFERENTIAL/PLATELET
BASO%: 0.2 % (ref 0.0–2.0)
Basophils Absolute: 0 10*3/uL (ref 0.0–0.1)
EOS ABS: 0.1 10*3/uL (ref 0.0–0.5)
EOS%: 0.5 % (ref 0.0–7.0)
HEMATOCRIT: 40.2 % (ref 38.4–49.9)
HEMOGLOBIN: 12.5 g/dL — AB (ref 13.0–17.1)
LYMPH#: 1.2 10*3/uL (ref 0.9–3.3)
LYMPH%: 11 % — ABNORMAL LOW (ref 14.0–49.0)
MCH: 23.5 pg — ABNORMAL LOW (ref 27.2–33.4)
MCHC: 31.1 g/dL — ABNORMAL LOW (ref 32.0–36.0)
MCV: 75.7 fL — ABNORMAL LOW (ref 79.3–98.0)
MONO#: 1.5 10*3/uL — AB (ref 0.1–0.9)
MONO%: 13.6 % (ref 0.0–14.0)
NEUT%: 74.7 % (ref 39.0–75.0)
NEUTROS ABS: 8.3 10*3/uL — AB (ref 1.5–6.5)
PLATELETS: 183 10*3/uL (ref 140–400)
RBC: 5.31 10*6/uL (ref 4.20–5.82)
RDW: 29.1 % — ABNORMAL HIGH (ref 11.0–14.6)
WBC: 11.1 10*3/uL — AB (ref 4.0–10.3)
nRBC: 0 % (ref 0–0)

## 2015-09-20 LAB — COMPREHENSIVE METABOLIC PANEL
ALBUMIN: 3.4 g/dL — AB (ref 3.5–5.0)
ALT: 33 U/L (ref 0–55)
ANION GAP: 6 meq/L (ref 3–11)
AST: 35 U/L — ABNORMAL HIGH (ref 5–34)
Alkaline Phosphatase: 177 U/L — ABNORMAL HIGH (ref 40–150)
BUN: 7.5 mg/dL (ref 7.0–26.0)
CO2: 26 meq/L (ref 22–29)
Calcium: 9.4 mg/dL (ref 8.4–10.4)
Chloride: 109 mEq/L (ref 98–109)
Creatinine: 1 mg/dL (ref 0.7–1.3)
EGFR: 84 mL/min/{1.73_m2} — ABNORMAL LOW (ref >90)
GLUCOSE: 114 mg/dL (ref 70–140)
Potassium: 4 mEq/L (ref 3.5–5.1)
Sodium: 142 mEq/L (ref 136–145)
Total Protein: 7.1 g/dL (ref 6.4–8.3)

## 2015-09-20 MED ORDER — DEXTROSE 5 % IV SOLN
Freq: Once | INTRAVENOUS | Status: AC
  Administered 2015-09-20: 13:00:00 via INTRAVENOUS

## 2015-09-20 MED ORDER — PALONOSETRON HCL INJECTION 0.25 MG/5ML
0.2500 mg | Freq: Once | INTRAVENOUS | Status: AC
  Administered 2015-09-20: 0.25 mg via INTRAVENOUS

## 2015-09-20 MED ORDER — FLUOROURACIL CHEMO INJECTION 2.5 GM/50ML
400.0000 mg/m2 | Freq: Once | INTRAVENOUS | Status: AC
  Administered 2015-09-20: 900 mg via INTRAVENOUS
  Filled 2015-09-20: qty 18

## 2015-09-20 MED ORDER — DEXTROSE 5 % IV SOLN
87.0000 mg/m2 | Freq: Once | INTRAVENOUS | Status: AC
  Administered 2015-09-20: 200 mg via INTRAVENOUS
  Filled 2015-09-20: qty 40

## 2015-09-20 MED ORDER — SODIUM CHLORIDE 0.9 % IV SOLN
10.0000 mg | Freq: Once | INTRAVENOUS | Status: AC
  Administered 2015-09-20: 10 mg via INTRAVENOUS
  Filled 2015-09-20: qty 1

## 2015-09-20 MED ORDER — SODIUM CHLORIDE 0.9 % IJ SOLN
10.0000 mL | INTRAMUSCULAR | Status: DC | PRN
  Administered 2015-09-20: 10 mL via INTRAVENOUS
  Filled 2015-09-20: qty 10

## 2015-09-20 MED ORDER — PALONOSETRON HCL INJECTION 0.25 MG/5ML
INTRAVENOUS | Status: AC
  Filled 2015-09-20: qty 5

## 2015-09-20 MED ORDER — LEUCOVORIN CALCIUM INJECTION 350 MG
400.0000 mg/m2 | Freq: Once | INTRAVENOUS | Status: AC
  Administered 2015-09-20: 912 mg via INTRAVENOUS
  Filled 2015-09-20: qty 45.6

## 2015-09-20 MED ORDER — SODIUM CHLORIDE 0.9 % IV SOLN
2400.0000 mg/m2 | INTRAVENOUS | Status: DC
  Administered 2015-09-20: 5450 mg via INTRAVENOUS
  Filled 2015-09-20: qty 109

## 2015-09-20 NOTE — Patient Instructions (Signed)

## 2015-09-20 NOTE — Progress Notes (Signed)
  [place] OFFICE PROGRESS NOTE   Diagnosis:  Gastric cancer  INTERVAL HISTORY:   [name] returns as scheduled. He completed cycle 4 FOLFOX 09/06/2015. He denies nausea/vomiting. No mouth sores. No diarrhea. He has mild persistent cold sensitivity. Stable numbness left fingertips, predated start of chemotherapy. He has a good appetite. No pain.  Objective:  Vital signs in last 24 hours:  Blood pressure 145/73, pulse 80, temperature 98 F (36.7 C), temperature source Oral, resp. rate 19, height '6\' 2"'$  (1.88 m), weight 214 lb 4.8 oz (97.206 kg), SpO2 94 %.    HEENT: No thrush or ulcers. Resp: Lungs clear bilaterally. Cardio: Regular rate and rhythm. GI: Abdomen soft and nontender. No hepatomegaly. Vascular: Trace lower leg edema bilaterally. Neuro: Vibratory sense mildly decreased over the fingertips, left greater than right, per tuning fork exam.  Port-A-Cath without erythema.   Lab Results:  Lab Results  Component Value Date   WBC 11.1* 09/20/2015   HGB 12.5* 09/20/2015   HCT 40.2 09/20/2015   MCV 75.7* 09/20/2015   PLT 183 09/20/2015   NEUTROABS 8.3* 09/20/2015    Imaging:  No results found.  Medications: I have reviewed the patient's current medications.  Assessment/Plan: 1. Gastric Cancer  Lesser curvature gastric body mass noted on endoscopy 06/27/2015, biopsy confirmed adenocarcinoma  Staging CT scans to 06/28/2015 with a gastric body mass, adenopathy adjacent to the stomach, chest adenopathy, a single enhancing liver lesion, and indeterminate nodular pulmonary lesions  PET scan 07/12/2015-hypermetabolic gastric mass, gastrohepatic ligament metastases, metastasis at the left adrenal gland, hypermetabolic right paratracheal, right hilar, and subcarinal nodes moderate metabolic activity involving 2 pulmonary lesions  Cycle 1 FOLFOX 07/19/2015  Cycle 2 FOLFOX 08/02/2015  Cycle 3 FOLFOX 08/23/2015 with Neulasta support  Cycle 4 FOLFOX  09/06/2015 with Neulasta support  Cycle 5 FOLFOX 09/20/2015 with Neulasta support  2. COPD  3. Nonischemic cardiomyopathy  4. Implanted cardiac defibrillator  5. History of prostate cancer  6. Hypertension  7. Iron deficiency anemia  8. Neutropenia secondary to chemotherapy-Neulasta added with cycle 3 FOLFOX 08/23/2015   Disposition: [name] appears stable. He has completed 4 cycles of FOLFOX. Plan to proceed with cycle 5 today as scheduled. He will have restaging CT scans prior to his next visit in 2 weeks. He will return for a follow-up visit 10/04/2014. He will contact the office in the interim with any problems.    Ned Card ANP/GNP-BC   09/20/2015  11:26 AM

## 2015-09-20 NOTE — Progress Notes (Signed)
Oncology Nurse Navigator Documentation  Oncology Nurse Navigator Flowsheets 09/20/2015  Navigator Location [facility]  Navigator Encounter Type Treatment  Telephone -  Abnormal Finding Date 06/27/2015  Confirmed Diagnosis Date 06/27/2015  Treatment Initiated Date 07/19/2015  Patient Visit Type MedOnc  Treatment Phase Active Tx--FOLFOX w/Neulasta  Barriers/Navigation Needs No barriers at this time;No Questions;No Needs  Education Other--lab results, staying active  Interventions -  Coordination of Care -  Education Method Verbal;Written  Support Groups/Services GI Support Group  Acuity Level 1  Time Spent with Patient 15  Feeling well and eating well. Still does yard work, which he enjoys. Looking forward to some family events this summer.

## 2015-09-20 NOTE — Telephone Encounter (Signed)
Per staff message and POF I have scheduled appts. Advised scheduler of appts. JMW  

## 2015-09-20 NOTE — Telephone Encounter (Signed)
per pof to sch pt appt-sent MW email to sch trmt-gave pt copy of avs

## 2015-09-20 NOTE — Patient Instructions (Signed)
[facility] Discharge Instructions for Patients Receiving Chemotherapy  Today you received the following chemotherapy agents:  Oxaliplatin, Leucovorin, Fluorouracil  To help prevent nausea and vomiting after your treatment, we encourage you to take your nausea medication as prescribed.   If you develop nausea and vomiting that is not controlled by your nausea medication, call the clinic.   BELOW ARE SYMPTOMS THAT SHOULD BE REPORTED IMMEDIATELY:  *FEVER GREATER THAN 100.5 F  *CHILLS WITH OR WITHOUT FEVER  NAUSEA AND VOMITING THAT IS NOT CONTROLLED WITH YOUR NAUSEA MEDICATION  *UNUSUAL SHORTNESS OF BREATH  *UNUSUAL BRUISING OR BLEEDING  TENDERNESS IN MOUTH AND THROAT WITH OR WITHOUT PRESENCE OF ULCERS  *URINARY PROBLEMS  *BOWEL PROBLEMS  UNUSUAL RASH Items with * indicate a potential emergency and should be followed up as soon as possible.  Feel free to call the clinic you have any questions or concerns. The clinic phone number is [phone].  Please show the CHEMO ALERT CARD at check-in to the Emergency Department and triage nurse.   

## 2015-09-22 ENCOUNTER — Ambulatory Visit (HOSPITAL_BASED_OUTPATIENT_CLINIC_OR_DEPARTMENT_OTHER): Payer: Commercial Managed Care - HMO

## 2015-09-22 ENCOUNTER — Ambulatory Visit: Payer: Commercial Managed Care - HMO

## 2015-09-22 DIAGNOSIS — C162 Malignant neoplasm of body of stomach: Secondary | ICD-10-CM

## 2015-09-22 MED ORDER — SODIUM CHLORIDE 0.9% FLUSH
10.0000 mL | INTRAVENOUS | Status: DC | PRN
  Administered 2015-09-22: 10 mL
  Filled 2015-09-22: qty 10

## 2015-09-22 MED ORDER — PEGFILGRASTIM INJECTION 6 MG/0.6ML ~~LOC~~
6.0000 mg | PREFILLED_SYRINGE | Freq: Once | SUBCUTANEOUS | Status: AC
  Administered 2015-09-22: 6 mg via SUBCUTANEOUS

## 2015-09-22 MED ORDER — HEPARIN SOD (PORK) LOCK FLUSH 100 UNIT/ML IV SOLN
500.0000 [IU] | Freq: Once | INTRAVENOUS | Status: AC | PRN
  Administered 2015-09-22: 500 [IU]
  Filled 2015-09-22: qty 5

## 2015-09-22 NOTE — Patient Instructions (Signed)
Pegfilgrastim injection What is this medicine? PEGFILGRASTIM (PEG fil gra stim) is a long-acting granulocyte colony-stimulating factor that stimulates the growth of neutrophils, a type of white blood cell important in the body's fight against infection. It is used to reduce the incidence of fever and infection in patients with certain types of cancer who are receiving chemotherapy that affects the bone marrow, and to increase survival after being exposed to high doses of radiation. This medicine may be used for other purposes; ask your health care provider or pharmacist if you have questions. What should I tell my health care provider before I take this medicine? They need to know if you have any of these conditions: -kidney disease -latex allergy -ongoing radiation therapy -sickle cell disease -skin reactions to acrylic adhesives (On-Body Injector only) -an unusual or allergic reaction to pegfilgrastim, filgrastim, other medicines, foods, dyes, or preservatives -pregnant or trying to get pregnant -breast-feeding How should I use this medicine? This medicine is for injection under the skin. If you get this medicine at home, you will be taught how to prepare and give the pre-filled syringe or how to use the On-body Injector. Refer to the patient Instructions for Use for detailed instructions. Use exactly as directed. Take your medicine at regular intervals. Do not take your medicine more often than directed. It is important that you put your used needles and syringes in a special sharps container. Do not put them in a trash can. If you do not have a sharps container, call your pharmacist or healthcare provider to get one. Talk to your pediatrician regarding the use of this medicine in children. While this drug may be prescribed for selected conditions, precautions do apply. Overdosage: If you think you have taken too much of this medicine contact a poison control center or emergency room at  once. NOTE: This medicine is only for you. Do not share this medicine with others. What if I miss a dose? It is important not to miss your dose. Call your doctor or health care professional if you miss your dose. If you miss a dose due to an On-body Injector failure or leakage, a new dose should be administered as soon as possible using a single prefilled syringe for manual use. What may interact with this medicine? Interactions have not been studied. Give your health care provider a list of all the medicines, herbs, non-prescription drugs, or dietary supplements you use. Also tell them if you smoke, drink alcohol, or use illegal drugs. Some items may interact with your medicine. This list may not describe all possible interactions. Give your health care provider a list of all the medicines, herbs, non-prescription drugs, or dietary supplements you use. Also tell them if you smoke, drink alcohol, or use illegal drugs. Some items may interact with your medicine. What should I watch for while using this medicine? You may need blood work done while you are taking this medicine. If you are going to need a MRI, CT scan, or other procedure, tell your doctor that you are using this medicine (On-Body Injector only). What side effects may I notice from receiving this medicine? Side effects that you should report to your doctor or health care professional as soon as possible: -allergic reactions like skin rash, itching or hives, swelling of the face, lips, or tongue -dizziness -fever -pain, redness, or irritation at site where injected -pinpoint red spots on the skin -red or dark-brown urine -shortness of breath or breathing problems -stomach or side pain, or pain   at the shoulder -swelling -tiredness -trouble passing urine or change in the amount of urine Side effects that usually do not require medical attention (report to your doctor or health care professional if they continue or are  bothersome): -bone pain -muscle pain This list may not describe all possible side effects. Call your doctor for medical advice about side effects. You may report side effects to FDA at 1-800-FDA-1088. Where should I keep my medicine? Keep out of the reach of children. Store pre-filled syringes in a refrigerator between 2 and 8 degrees C (36 and 46 degrees F). Do not freeze. Keep in carton to protect from light. Throw away this medicine if it is left out of the refrigerator for more than 48 hours. Throw away any unused medicine after the expiration date. NOTE: This sheet is a summary. It may not cover all possible information. If you have questions about this medicine, talk to your doctor, pharmacist, or health care provider.    2016, Elsevier/Gold Standard. (2014-05-11 14:30:14)  

## 2015-09-27 ENCOUNTER — Telehealth: Payer: Self-pay | Admitting: Cardiology

## 2015-09-27 NOTE — Telephone Encounter (Signed)
Spoke w/ pt and requested that he send a manual transmission b/c his home monitor has not updated in at least 8 days.   

## 2015-09-30 ENCOUNTER — Other Ambulatory Visit: Payer: Self-pay | Admitting: Oncology

## 2015-10-02 ENCOUNTER — Encounter (HOSPITAL_COMMUNITY): Payer: Self-pay

## 2015-10-02 ENCOUNTER — Ambulatory Visit (HOSPITAL_COMMUNITY)
Admission: RE | Admit: 2015-10-02 | Discharge: 2015-10-02 | Disposition: A | Discharge status: Home / Self Care | Payer: Commercial Managed Care - HMO | Source: Ambulatory Visit | Attending: Nurse Practitioner | Admitting: Nurse Practitioner

## 2015-10-02 DIAGNOSIS — M899 Disorder of bone, unspecified: Secondary | ICD-10-CM | POA: Insufficient documentation

## 2015-10-02 DIAGNOSIS — K4090 Unilateral inguinal hernia, without obstruction or gangrene, not specified as recurrent: Secondary | ICD-10-CM | POA: Insufficient documentation

## 2015-10-02 DIAGNOSIS — R590 Localized enlarged lymph nodes: Secondary | ICD-10-CM | POA: Insufficient documentation

## 2015-10-02 DIAGNOSIS — C169 Malignant neoplasm of stomach, unspecified: Secondary | ICD-10-CM | POA: Insufficient documentation

## 2015-10-02 DIAGNOSIS — Q396 Congenital diverticulum of esophagus: Secondary | ICD-10-CM | POA: Diagnosis not present

## 2015-10-02 DIAGNOSIS — C7972 Secondary malignant neoplasm of left adrenal gland: Secondary | ICD-10-CM | POA: Diagnosis not present

## 2015-10-02 DIAGNOSIS — C772 Secondary and unspecified malignant neoplasm of intra-abdominal lymph nodes: Secondary | ICD-10-CM | POA: Diagnosis not present

## 2015-10-02 DIAGNOSIS — R911 Solitary pulmonary nodule: Secondary | ICD-10-CM | POA: Diagnosis not present

## 2015-10-02 DIAGNOSIS — Z9079 Acquired absence of other genital organ(s): Secondary | ICD-10-CM | POA: Diagnosis not present

## 2015-10-02 MED ORDER — IOPAMIDOL (ISOVUE-300) INJECTION 61%
100.0000 mL | Freq: Once | INTRAVENOUS | Status: AC | PRN
Start: 2015-10-02 — End: 2015-10-02
  Administered 2015-10-02: 100 mL via INTRAVENOUS

## 2015-10-04 ENCOUNTER — Ambulatory Visit (HOSPITAL_BASED_OUTPATIENT_CLINIC_OR_DEPARTMENT_OTHER): Payer: Commercial Managed Care - HMO | Admitting: Oncology

## 2015-10-04 ENCOUNTER — Ambulatory Visit (HOSPITAL_BASED_OUTPATIENT_CLINIC_OR_DEPARTMENT_OTHER): Payer: Commercial Managed Care - HMO

## 2015-10-04 ENCOUNTER — Telehealth: Payer: Self-pay | Admitting: Oncology

## 2015-10-04 ENCOUNTER — Encounter: Payer: Self-pay | Admitting: *Deleted

## 2015-10-04 ENCOUNTER — Other Ambulatory Visit: Payer: Commercial Managed Care - HMO

## 2015-10-04 ENCOUNTER — Other Ambulatory Visit (HOSPITAL_BASED_OUTPATIENT_CLINIC_OR_DEPARTMENT_OTHER): Payer: Commercial Managed Care - HMO

## 2015-10-04 ENCOUNTER — Ambulatory Visit: Payer: Commercial Managed Care - HMO | Admitting: Nutrition

## 2015-10-04 VITALS — BP 154/68 | HR 67 | Temp 98.4°F | Resp 18 | Ht 74.0 in | Wt 215.4 lb

## 2015-10-04 DIAGNOSIS — C169 Malignant neoplasm of stomach, unspecified: Secondary | ICD-10-CM

## 2015-10-04 DIAGNOSIS — C162 Malignant neoplasm of body of stomach: Secondary | ICD-10-CM

## 2015-10-04 DIAGNOSIS — I10 Essential (primary) hypertension: Secondary | ICD-10-CM

## 2015-10-04 DIAGNOSIS — C7972 Secondary malignant neoplasm of left adrenal gland: Secondary | ICD-10-CM | POA: Diagnosis not present

## 2015-10-04 DIAGNOSIS — Z452 Encounter for adjustment and management of vascular access device: Secondary | ICD-10-CM

## 2015-10-04 DIAGNOSIS — D509 Iron deficiency anemia, unspecified: Secondary | ICD-10-CM

## 2015-10-04 DIAGNOSIS — D701 Agranulocytosis secondary to cancer chemotherapy: Secondary | ICD-10-CM

## 2015-10-04 DIAGNOSIS — Z95828 Presence of other vascular implants and grafts: Secondary | ICD-10-CM

## 2015-10-04 DIAGNOSIS — G620 Drug-induced polyneuropathy: Secondary | ICD-10-CM

## 2015-10-04 DIAGNOSIS — Z5111 Encounter for antineoplastic chemotherapy: Secondary | ICD-10-CM | POA: Diagnosis not present

## 2015-10-04 LAB — CBC WITH DIFFERENTIAL/PLATELET
BASO%: 0.4 % (ref 0.0–2.0)
BASOS ABS: 0 10*3/uL (ref 0.0–0.1)
EOS ABS: 0.1 10*3/uL (ref 0.0–0.5)
EOS%: 0.7 % (ref 0.0–7.0)
HCT: 38.7 % (ref 38.4–49.9)
HEMOGLOBIN: 12.2 g/dL — AB (ref 13.0–17.1)
LYMPH%: 10.9 % — AB (ref 14.0–49.0)
MCH: 24 pg — AB (ref 27.2–33.4)
MCHC: 31.5 g/dL — ABNORMAL LOW (ref 32.0–36.0)
MCV: 76.1 fL — AB (ref 79.3–98.0)
MONO#: 1.2 10*3/uL — ABNORMAL HIGH (ref 0.1–0.9)
MONO%: 12.8 % (ref 0.0–14.0)
NEUT#: 7 10*3/uL — ABNORMAL HIGH (ref 1.5–6.5)
NEUT%: 75.2 % — AB (ref 39.0–75.0)
Platelets: 125 10*3/uL — ABNORMAL LOW (ref 140–400)
RBC: 5.09 10*6/uL (ref 4.20–5.82)
RDW: 31.1 % — ABNORMAL HIGH (ref 11.0–14.6)
WBC: 9.3 10*3/uL (ref 4.0–10.3)
lymph#: 1 10*3/uL (ref 0.9–3.3)

## 2015-10-04 LAB — COMPREHENSIVE METABOLIC PANEL
ALBUMIN: 3.2 g/dL — AB (ref 3.5–5.0)
ALK PHOS: 161 U/L — AB (ref 40–150)
ALT: 27 U/L (ref 0–55)
AST: 34 U/L (ref 5–34)
Anion Gap: 7 mEq/L (ref 3–11)
BUN: 9.1 mg/dL (ref 7.0–26.0)
CO2: 23 mEq/L (ref 22–29)
Calcium: 9.1 mg/dL (ref 8.4–10.4)
Chloride: 111 mEq/L — ABNORMAL HIGH (ref 98–109)
Creatinine: 1 mg/dL (ref 0.7–1.3)
EGFR: 86 mL/min/{1.73_m2} — AB (ref >90)
GLUCOSE: 143 mg/dL — AB (ref 70–140)
POTASSIUM: 3.9 meq/L (ref 3.5–5.1)
SODIUM: 141 meq/L (ref 136–145)
Total Bilirubin: <0.3 mg/dL (ref 0.20–1.20)
Total Protein: 6.9 g/dL (ref 6.4–8.3)

## 2015-10-04 MED ORDER — FLUOROURACIL CHEMO INJECTION 2.5 GM/50ML
400.0000 mg/m2 | Freq: Once | INTRAVENOUS | Status: AC
  Administered 2015-10-04: 900 mg via INTRAVENOUS
  Filled 2015-10-04: qty 18

## 2015-10-04 MED ORDER — SODIUM CHLORIDE 0.9 % IV SOLN
10.0000 mg | Freq: Once | INTRAVENOUS | Status: AC
  Administered 2015-10-04: 10 mg via INTRAVENOUS
  Filled 2015-10-04: qty 1

## 2015-10-04 MED ORDER — SODIUM CHLORIDE 0.9 % IJ SOLN
10.0000 mL | INTRAMUSCULAR | Status: DC | PRN
  Administered 2015-10-04: 10 mL via INTRAVENOUS
  Filled 2015-10-04: qty 10

## 2015-10-04 MED ORDER — SODIUM CHLORIDE 0.9% FLUSH
3.0000 mL | INTRAVENOUS | Status: DC | PRN
  Filled 2015-10-04: qty 10

## 2015-10-04 MED ORDER — SODIUM CHLORIDE 0.9% FLUSH
10.0000 mL | INTRAVENOUS | Status: DC | PRN
  Filled 2015-10-04: qty 10

## 2015-10-04 MED ORDER — OXALIPLATIN CHEMO INJECTION 100 MG/20ML
87.0000 mg/m2 | Freq: Once | INTRAVENOUS | Status: AC
  Administered 2015-10-04: 200 mg via INTRAVENOUS
  Filled 2015-10-04: qty 40

## 2015-10-04 MED ORDER — HEPARIN SOD (PORK) LOCK FLUSH 100 UNIT/ML IV SOLN
250.0000 [IU] | Freq: Once | INTRAVENOUS | Status: DC | PRN
  Filled 2015-10-04: qty 5

## 2015-10-04 MED ORDER — PALONOSETRON HCL INJECTION 0.25 MG/5ML
0.2500 mg | Freq: Once | INTRAVENOUS | Status: AC
  Administered 2015-10-04: 0.25 mg via INTRAVENOUS

## 2015-10-04 MED ORDER — SODIUM CHLORIDE 0.9 % IV SOLN
2400.0000 mg/m2 | INTRAVENOUS | Status: AC
  Administered 2015-10-04: 5450 mg via INTRAVENOUS
  Filled 2015-10-04: qty 109

## 2015-10-04 MED ORDER — HEPARIN SOD (PORK) LOCK FLUSH 100 UNIT/ML IV SOLN
500.0000 [IU] | Freq: Once | INTRAVENOUS | Status: DC | PRN
  Filled 2015-10-04: qty 5

## 2015-10-04 MED ORDER — DEXTROSE 5 % IV SOLN
400.0000 mg/m2 | Freq: Once | INTRAVENOUS | Status: AC
  Administered 2015-10-04: 912 mg via INTRAVENOUS
  Filled 2015-10-04: qty 45.6

## 2015-10-04 MED ORDER — ALTEPLASE 2 MG IJ SOLR
2.0000 mg | Freq: Once | INTRAMUSCULAR | Status: DC | PRN
  Filled 2015-10-04: qty 2

## 2015-10-04 MED ORDER — PALONOSETRON HCL INJECTION 0.25 MG/5ML
INTRAVENOUS | Status: AC
  Filled 2015-10-04: qty 5

## 2015-10-04 MED ORDER — DEXTROSE 5 % IV SOLN
Freq: Once | INTRAVENOUS | Status: AC
  Administered 2015-10-04: 12:00:00 via INTRAVENOUS

## 2015-10-04 NOTE — Progress Notes (Signed)
[facility] [facility] OFFICE PROGRESS NOTE   Diagnosis: Gastric cancer  INTERVAL HISTORY:   [name] returns as scheduled. He completed another cycle of FOLFOX 09/20/2015. No nausea/vomiting or diarrhea following chemotherapy. He has noted increased tingling in the feet. Mild difficulty buttoning his shirt predates chemotherapy. Good appetite.  Objective:  Vital signs in last 24 hours:  Blood pressure 154/68, pulse 67, temperature 98.4 F (36.9 C), temperature source Oral, resp. rate 18, height '6\' 2"'$  (1.88 m), weight 215 lb 6.4 oz (97.705 kg), SpO2 98 %.    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 PET-CT documents hypermetabolic gastrohepatic ligament metastases with metastatic involvement in the left adrenal gland and hypermetabolic mediastinal and right hilar lymph nodes. EXAM: CT CHEST, ABDOMEN, AND PELVIS WITH CONTRAST TECHNIQUE: Multidetector CT imaging of the chest, abdomen and pelvis was performed following the standard protocol during bolus administration of intravenous contrast. CONTRAST:  139m ISOVUE-300 IOPAMIDOL (ISOVUE-300) INJECTION 61% COMPARISON:  PET-CT 07/12/2015.  Chest CT from 06/28/2015 FINDINGS: CT CHEST FINDINGS Mediastinum/Lymph Nodes: There is no axillary lymphadenopathy. 11 mm right paratracheal lymph  node seen on previous CT scan is now 10 mm. 8 mm AP window lymph node on the previous CT scan is now 6 mm. 17 mm short axis right hilar lymph node on the previous study is now 10 mm. 15 mm short axis subcarinal lymph node has decreased to 9 mm on the current exam. The heart size is normal. No pericardial effusion. Left-sided permanent pacemaker again noted. Diverticulum noted distal esophagus. Right-sided Port-A-Cath tip is positioned in the distal SVC. Lungs/Pleura: Lung windows show emphysema bilaterally. 7 mm irregular nodule in the central right upper lobe (image 47 series 5) is stable. Subpleural probable scarring in the posterior right upper lobe is also unchanged. 7 mm right lower lobe nodule (image 131 series 5) is stable. Other scattered smaller right lung nodules are unchanged. Several scattered tiny left lung nodules are stable. 2 cm area of ill-defined opacity in the lingula is unchanged and probably represents scarring. The 12 mm region of hypermetabolism identified in the posterior left lung base peripherally on the previous study persists and measures about 14 mm today. Musculoskeletal: 11 mm sclerotic lesion in the left third rib (image 37 series 5) was not definitely present previously. CT ABDOMEN PELVIS FINDINGS Hepatobiliary: Stable 11 mm hypervascular lesion in the left liver. No new or progressive liver disease. Tiny calcified gallstone again noted. No intrahepatic or extrahepatic biliary dilation. Pancreas: No focal mass lesion. No dilatation of the main duct. No intraparenchymal cyst. No peripancreatic edema. Spleen: No splenomegaly. No focal mass lesion. Adrenals/Urinary Tract: Diffuse thickening left adrenal gland is stable without a discrete nodule or mass. Right adrenal gland unremarkable. No enhancing lesion in either kidney. 17 mm probable cyst noted in the right kidney.  No evidence for hydroureter. The urinary bladder appears normal for the degree of distention. Stomach/Bowel: Irregular  wall thickening seen in the distal stomach appears decreased in the interval. Duodenum is normally positioned as is the ligament of Treitz. No small bowel wall thickening. No small bowel dilatation. The terminal ileum is normal. The appendix is normal. Diverticular changes are noted in the left colon without evidence of diverticulitis. Vascular/Lymphatic: The perigastric lymph nodes seen previously have decreased. Index 3 mm short axis lymph node seen on image 77 series 2 was about 5 mm short axis previously. Lymph node adjacent to the gastric antrum measures 3.0 x 1.0 cm today compared to 3.1 x 1.6 cm previously. 3.1 by 1.3 cm lymph node medial to the gastric fundus was 3.6 x 1.3 cm previously. Borderline lymphadenopathy in the hepatoduodenal ligament is not substantially changed. There is abdominal aortic atherosclerosis without aneurysm. No retroperitoneal lymphadenopathy. No pelvic sidewall lymphadenopathy. Reproductive: Prostate gland is surgically absent. Other: No intraperitoneal free fluid. Musculoskeletal: Right inguinal hernia contains only fat. Bone windows reveal no worrisome lytic or sclerotic osseous lesions. IMPRESSION: 1. Interval decrease in mediastinal and right hilar lymphadenopathy. 2. 11 mm sclerotic lesion in the posterior left third rib was not definitely seen on 06/28/2015 , but was barely visible on the PET-CT of 07/12/2015. There was no evidence for hypermetabolism in this lesion at the time of PET imaging, but the sclerosis appears more advanced since the PET-CT. Bony metastatic deposit would be a consideration. 3. Stable appearance 11 mm hypervascular lesion in the central left liver. 4. Irregular wall thickening in the distal stomach appears decreased in the interval. The perigastric lymph nodes have decreased in size in the interval. 5. Stable thickening left adrenal gland. 6. Right inguinal hernia contains only fat. 7. Distal esophageal diverticulum. 8. Status post prostatectomy.  Electronically Signed   By: Misty Stanley M.D.   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 PET-CT documents hypermetabolic gastrohepatic ligament metastases with metastatic involvement in the left adrenal gland and hypermetabolic mediastinal and right hilar lymph nodes. EXAM: CT CHEST, ABDOMEN, AND PELVIS WITH CONTRAST TECHNIQUE: Multidetector CT imaging of the chest, abdomen and pelvis was performed following the standard protocol during bolus administration of intravenous contrast. CONTRAST:  121m ISOVUE-300 IOPAMIDOL (ISOVUE-300) INJECTION 61% COMPARISON:  PET-CT 07/12/2015.  Chest CT from 06/28/2015 FINDINGS: CT CHEST FINDINGS Mediastinum/Lymph Nodes: There is no axillary lymphadenopathy. 11 mm right paratracheal lymph node seen on previous CT scan is now 10 mm. 8 mm AP window lymph node on the previous CT scan is now 6 mm. 17 mm short axis right hilar lymph node on the previous study is now 10 mm. 15 mm short axis subcarinal lymph node has decreased to 9 mm on the current exam. The heart size is normal. No pericardial effusion. Left-sided permanent pacemaker again noted. Diverticulum noted distal esophagus. Right-sided Port-A-Cath tip is positioned in the distal SVC. Lungs/Pleura: Lung windows show emphysema bilaterally. 7 mm irregular nodule in the central right upper lobe (image 47 series 5) is stable. Subpleural probable scarring in the posterior right upper lobe is also unchanged. 7 mm right lower lobe nodule (image 131 series 5) is stable. Other scattered smaller right lung nodules are unchanged. Several scattered tiny left lung nodules are stable. 2 cm area of ill-defined opacity in the lingula is unchanged and probably represents scarring. The  12 mm region of hypermetabolism identified in the posterior left lung base peripherally on the previous study persists  and measures about 14 mm today. Musculoskeletal: 11 mm sclerotic lesion in the left third rib (image 37 series 5) was not definitely present previously. CT ABDOMEN PELVIS FINDINGS Hepatobiliary: Stable 11 mm hypervascular lesion in the left liver. No new or progressive liver disease. Tiny calcified gallstone again noted. No intrahepatic or extrahepatic biliary dilation. Pancreas: No focal mass lesion. No dilatation of the main duct. No intraparenchymal cyst. No peripancreatic edema. Spleen: No splenomegaly. No focal mass lesion. Adrenals/Urinary Tract: Diffuse thickening left adrenal gland is stable without a discrete nodule or mass. Right adrenal gland unremarkable. No enhancing lesion in either kidney. 17 mm probable cyst noted in the right kidney. No evidence for hydroureter. The urinary bladder appears normal for the degree of distention. Stomach/Bowel: Irregular wall thickening seen in the distal stomach appears decreased in the interval. Duodenum is normally positioned as is the ligament of Treitz. No small bowel wall thickening. No small bowel dilatation. The terminal ileum is normal. The appendix is normal. Diverticular changes are noted in the left colon without evidence of diverticulitis. Vascular/Lymphatic: The perigastric lymph nodes seen previously have decreased. Index 3 mm short axis lymph node seen on image 77 series 2 was about 5 mm short axis previously. Lymph node adjacent to the gastric antrum measures 3.0 x 1.0 cm today compared to 3.1 x 1.6 cm previously. 3.1 by 1.3 cm lymph node medial to the gastric fundus was 3.6 x 1.3 cm previously. Borderline lymphadenopathy in the hepatoduodenal ligament is not substantially changed. There is abdominal aortic atherosclerosis without aneurysm. No retroperitoneal lymphadenopathy. No pelvic sidewall lymphadenopathy. Reproductive: Prostate gland is surgically absent. Other: No intraperitoneal free fluid. Musculoskeletal: Right inguinal hernia contains only  fat. Bone windows reveal no worrisome lytic or sclerotic osseous lesions. IMPRESSION: 1. Interval decrease in mediastinal and right hilar lymphadenopathy. 2. 11 mm sclerotic lesion in the posterior left third rib was not definitely seen on 06/28/2015 , but was barely visible on the PET-CT of 07/12/2015. There was no evidence for hypermetabolism in this lesion at the time of PET imaging, but the sclerosis appears more advanced since the PET-CT. Bony metastatic deposit would be a consideration. 3. Stable appearance 11 mm hypervascular lesion in the central left liver. 4. Irregular wall thickening in the distal stomach appears decreased in the interval. The perigastric lymph nodes have decreased in size in the interval. 5. Stable thickening left adrenal gland. 6. Right inguinal hernia contains only fat. 7. Distal esophageal diverticulum. 8. Status post prostatectomy. Electronically Signed   By: Misty Stanley M.D.   On: 10/02/2015 15:48  CT images reviewed with [name] and his family.  Medications: I have reviewed the patient's current medications.  Assessment/Plan: 1. Gastric Cancer  Lesser curvature gastric body mass noted on endoscopy 06/27/2015, biopsy confirmed adenocarcinoma  Staging CT scans to 06/28/2015 with a gastric body mass, adenopathy adjacent to the stomach, chest adenopathy, a single enhancing liver lesion, and indeterminate nodular pulmonary lesions  PET scan 07/12/2015-hypermetabolic gastric mass, gastrohepatic ligament metastases, metastasis at the left adrenal gland, hypermetabolic right paratracheal, right hilar, and subcarinal nodes moderate metabolic activity involving 2 pulmonary lesions  Cycle 1 FOLFOX 07/19/2015  Cycle 2 FOLFOX 08/02/2015  Cycle 3 FOLFOX 08/23/2015 with Neulasta support  Cycle 4 FOLFOX 09/06/2015 with Neulasta support  Cycle 5 FOLFOX 09/20/2015 with Neulasta support  Restaging CTs 10/02/2015-decrease in chest and perigastric lymphadenopathy,  decrease thickening  at the distal stomach, 11 mm sclerotic lesion in the posterior left third rib-barely visible on the PET/CT 07/12/2015  Cycle 6 FOLFOX 10/04/2015  2. COPD  3. Nonischemic cardiomyopathy  4. Implanted cardiac defibrillator  5. History of prostate cancer  6. Hypertension  7. Iron deficiency anemia  8. Neutropenia secondary to chemotherapy-Neulasta added with cycle 3 FOLFOX 08/23/2015  9.   Oxaliplatin neuropathy    Disposition:  [name] has completed 5 cycles of FOLFOX. He has tolerated the chemotherapy well. I reviewed the CT images with [name] and his family. There is been significant improvement in the gastric thickening and lymphadenopathy. The sclerotic rib lesion may be a treated metastasis.  The plan is to proceed with additional FOLFOX chemotherapy. We will follow the neuropathy symptoms closely. He will complete cycle 6 FOLFOX today. He will return for an office visit and chemotherapy in 2 weeks.   Betsy Coder, MD  10/04/2015  3:14 PM

## 2015-10-04 NOTE — Patient Instructions (Signed)
[facility] Discharge Instructions for Patients Receiving Chemotherapy  Today you received the following chemotherapy agents: Oxalplatin, Leucovorin, 5 FU  To help prevent nausea and vomiting after your treatment, we encourage you to take your nausea medication as prescribed.  If you develop nausea and vomiting that is not controlled by your nausea medication, call the clinic.   BELOW ARE SYMPTOMS THAT SHOULD BE REPORTED IMMEDIATELY:  *FEVER GREATER THAN 100.5 F  *CHILLS WITH OR WITHOUT FEVER  NAUSEA AND VOMITING THAT IS NOT CONTROLLED WITH YOUR NAUSEA MEDICATION  *UNUSUAL SHORTNESS OF BREATH  *UNUSUAL BRUISING OR BLEEDING  TENDERNESS IN MOUTH AND THROAT WITH OR WITHOUT PRESENCE OF ULCERS  *URINARY PROBLEMS  *BOWEL PROBLEMS  UNUSUAL RASH Items with * indicate a potential emergency and should be followed up as soon as possible.  Feel free to call the clinic you have any questions or concerns. The clinic phone number is [phone].  Please show the [place] at check-in to the Emergency Department and triage nurse.

## 2015-10-04 NOTE — Patient Instructions (Signed)

## 2015-10-04 NOTE — Progress Notes (Signed)
Oncology Nurse Navigator Documentation  Oncology Nurse Navigator Flowsheets 10/04/2015  Navigator Location [facility]  Navigator Encounter Type Treatment  Telephone -  Abnormal Finding Date -  Confirmed Diagnosis Date -  Treatment Initiated Date -  Patient Visit Type MedOnc  Treatment Phase Active Tx  Barriers/Navigation Needs No barriers at this time;No Questions;No Needs  Education -  Interventions None required  Coordination of Care -  Education Method -Scans show improvement. Will continue another 5 cycles and re scan.  Support Groups/Services -  Acuity -  Time Spent with Patient 15

## 2015-10-04 NOTE — Progress Notes (Signed)
Nutrition follow-up completed with patient during infusion for gastric cancer. Weight was documented as 215.4 pounds June 1 decreased from 220.1 pounds April 13 but is stable over the past 3 weeks. Patient reports he is drinking boost twice a day. He is trying to eat more. Denies nutrition impact symptoms.  Nutrition diagnosis: Food and nutrition related knowledge deficit improved.  Intervention: I enforced importance of continued small frequent meals and snacks with adequate calories and protein. Recommended patient increase boost plus 3 times daily and provided additional coupons. Teach back method was used.  Monitoring, evaluation, goals: Patient will work to increase calories and protein to promote weight maintenance.  Next visit: Thursday, June 15, during infusion.  **Disclaimer: This note was dictated with voice recognition software. Similar sounding words can inadvertently be transcribed and this note may contain transcription errors which may not have been corrected upon publication of note.**

## 2015-10-04 NOTE — Telephone Encounter (Signed)
Gave and printed appt sched and avs for pt for June °

## 2015-10-06 ENCOUNTER — Ambulatory Visit (HOSPITAL_BASED_OUTPATIENT_CLINIC_OR_DEPARTMENT_OTHER): Payer: Commercial Managed Care - HMO

## 2015-10-06 ENCOUNTER — Ambulatory Visit: Payer: Commercial Managed Care - HMO

## 2015-10-06 VITALS — BP 139/70 | HR 73 | Temp 98.0°F | Resp 19

## 2015-10-06 DIAGNOSIS — C162 Malignant neoplasm of body of stomach: Secondary | ICD-10-CM | POA: Diagnosis not present

## 2015-10-06 MED ORDER — SODIUM CHLORIDE 0.9% FLUSH
10.0000 mL | INTRAVENOUS | Status: DC | PRN
  Administered 2015-10-06: 10 mL
  Filled 2015-10-06: qty 10

## 2015-10-06 MED ORDER — PEGFILGRASTIM INJECTION 6 MG/0.6ML ~~LOC~~
6.0000 mg | PREFILLED_SYRINGE | Freq: Once | SUBCUTANEOUS | Status: AC
Start: 2015-10-06 — End: 2015-10-06
  Administered 2015-10-06: 6 mg via SUBCUTANEOUS
  Filled 2015-10-06: qty 0.6

## 2015-10-06 MED ORDER — HEPARIN SOD (PORK) LOCK FLUSH 100 UNIT/ML IV SOLN
500.0000 [IU] | Freq: Once | INTRAVENOUS | Status: AC | PRN
  Administered 2015-10-06: 500 [IU]
  Filled 2015-10-06: qty 5

## 2015-10-06 NOTE — Progress Notes (Signed)
Will be done with flush appt

## 2015-10-14 ENCOUNTER — Other Ambulatory Visit: Payer: Self-pay | Admitting: Oncology

## 2015-10-15 ENCOUNTER — Other Ambulatory Visit: Payer: Self-pay | Admitting: Gastroenterology

## 2015-10-16 ENCOUNTER — Ambulatory Visit (INDEPENDENT_AMBULATORY_CARE_PROVIDER_SITE_OTHER): Payer: Commercial Managed Care - HMO | Admitting: *Deleted

## 2015-10-16 DIAGNOSIS — I5022 Chronic systolic (congestive) heart failure: Secondary | ICD-10-CM | POA: Diagnosis not present

## 2015-10-16 DIAGNOSIS — I429 Cardiomyopathy, unspecified: Secondary | ICD-10-CM

## 2015-10-16 DIAGNOSIS — I428 Other cardiomyopathies: Secondary | ICD-10-CM

## 2015-10-16 NOTE — Progress Notes (Signed)
Remote ICD transmission.   

## 2015-10-18 ENCOUNTER — Ambulatory Visit (HOSPITAL_BASED_OUTPATIENT_CLINIC_OR_DEPARTMENT_OTHER): Payer: Commercial Managed Care - HMO

## 2015-10-18 ENCOUNTER — Ambulatory Visit: Payer: Commercial Managed Care - HMO | Admitting: Nutrition

## 2015-10-18 ENCOUNTER — Ambulatory Visit (HOSPITAL_BASED_OUTPATIENT_CLINIC_OR_DEPARTMENT_OTHER): Payer: Commercial Managed Care - HMO | Admitting: Nurse Practitioner

## 2015-10-18 ENCOUNTER — Ambulatory Visit: Payer: Commercial Managed Care - HMO

## 2015-10-18 ENCOUNTER — Other Ambulatory Visit (HOSPITAL_BASED_OUTPATIENT_CLINIC_OR_DEPARTMENT_OTHER): Payer: Commercial Managed Care - HMO

## 2015-10-18 ENCOUNTER — Telehealth: Payer: Self-pay | Admitting: Oncology

## 2015-10-18 VITALS — BP 144/60 | HR 76 | Temp 97.9°F | Resp 18 | Ht 74.0 in | Wt 214.7 lb

## 2015-10-18 DIAGNOSIS — Z95828 Presence of other vascular implants and grafts: Secondary | ICD-10-CM

## 2015-10-18 DIAGNOSIS — Z5111 Encounter for antineoplastic chemotherapy: Secondary | ICD-10-CM | POA: Diagnosis not present

## 2015-10-18 DIAGNOSIS — C162 Malignant neoplasm of body of stomach: Secondary | ICD-10-CM

## 2015-10-18 DIAGNOSIS — D509 Iron deficiency anemia, unspecified: Secondary | ICD-10-CM

## 2015-10-18 DIAGNOSIS — D701 Agranulocytosis secondary to cancer chemotherapy: Secondary | ICD-10-CM | POA: Diagnosis not present

## 2015-10-18 DIAGNOSIS — Z452 Encounter for adjustment and management of vascular access device: Secondary | ICD-10-CM | POA: Diagnosis not present

## 2015-10-18 DIAGNOSIS — Z8546 Personal history of malignant neoplasm of prostate: Secondary | ICD-10-CM

## 2015-10-18 DIAGNOSIS — C169 Malignant neoplasm of stomach, unspecified: Secondary | ICD-10-CM | POA: Diagnosis not present

## 2015-10-18 DIAGNOSIS — I10 Essential (primary) hypertension: Secondary | ICD-10-CM

## 2015-10-18 DIAGNOSIS — G620 Drug-induced polyneuropathy: Secondary | ICD-10-CM | POA: Diagnosis not present

## 2015-10-18 LAB — COMPREHENSIVE METABOLIC PANEL
ALBUMIN: 3.3 g/dL — AB (ref 3.5–5.0)
ALK PHOS: 161 U/L — AB (ref 40–150)
ALT: 29 U/L (ref 0–55)
ANION GAP: 7 meq/L (ref 3–11)
AST: 33 U/L (ref 5–34)
BUN: 10.3 mg/dL (ref 7.0–26.0)
CALCIUM: 9.2 mg/dL (ref 8.4–10.4)
CO2: 24 mEq/L (ref 22–29)
Chloride: 110 mEq/L — ABNORMAL HIGH (ref 98–109)
Creatinine: 1.1 mg/dL (ref 0.7–1.3)
EGFR: 74 mL/min/{1.73_m2} — AB (ref >90)
Glucose: 129 mg/dl (ref 70–140)
POTASSIUM: 3.9 meq/L (ref 3.5–5.1)
Sodium: 140 mEq/L (ref 136–145)
Total Bilirubin: 0.33 mg/dL (ref 0.20–1.20)
Total Protein: 7 g/dL (ref 6.4–8.3)

## 2015-10-18 LAB — CBC WITH DIFFERENTIAL/PLATELET
BASO%: 0.2 % (ref 0.0–2.0)
BASOS ABS: 0 10*3/uL (ref 0.0–0.1)
EOS%: 0.9 % (ref 0.0–7.0)
Eosinophils Absolute: 0.1 10*3/uL (ref 0.0–0.5)
HEMATOCRIT: 38.8 % (ref 38.4–49.9)
HEMOGLOBIN: 12.3 g/dL — AB (ref 13.0–17.1)
LYMPH#: 1.2 10*3/uL (ref 0.9–3.3)
LYMPH%: 13.2 % — AB (ref 14.0–49.0)
MCH: 24.6 pg — AB (ref 27.2–33.4)
MCHC: 31.7 g/dL — AB (ref 32.0–36.0)
MCV: 77.6 fL — AB (ref 79.3–98.0)
MONO#: 1.1 10*3/uL — AB (ref 0.1–0.9)
MONO%: 12.3 % (ref 0.0–14.0)
NEUT#: 6.6 10*3/uL — ABNORMAL HIGH (ref 1.5–6.5)
NEUT%: 73.4 % (ref 39.0–75.0)
Platelets: 108 10*3/uL — ABNORMAL LOW (ref 140–400)
RBC: 5 10*6/uL (ref 4.20–5.82)
RDW: 26.4 % — ABNORMAL HIGH (ref 11.0–14.6)
WBC: 9 10*3/uL (ref 4.0–10.3)
nRBC: 0 % (ref 0–0)

## 2015-10-18 MED ORDER — PALONOSETRON HCL INJECTION 0.25 MG/5ML
0.2500 mg | Freq: Once | INTRAVENOUS | Status: AC
  Administered 2015-10-18: 0.25 mg via INTRAVENOUS

## 2015-10-18 MED ORDER — FLUOROURACIL CHEMO INJECTION 2.5 GM/50ML
400.0000 mg/m2 | Freq: Once | INTRAVENOUS | Status: AC
  Administered 2015-10-18: 900 mg via INTRAVENOUS
  Filled 2015-10-18: qty 18

## 2015-10-18 MED ORDER — DEXTROSE 5 % IV SOLN
Freq: Once | INTRAVENOUS | Status: AC
  Administered 2015-10-18: 11:00:00 via INTRAVENOUS

## 2015-10-18 MED ORDER — LEUCOVORIN CALCIUM INJECTION 350 MG
400.0000 mg/m2 | Freq: Once | INTRAVENOUS | Status: AC
  Administered 2015-10-18: 912 mg via INTRAVENOUS
  Filled 2015-10-18: qty 45.6

## 2015-10-18 MED ORDER — SODIUM CHLORIDE 0.9 % IV SOLN
2400.0000 mg/m2 | INTRAVENOUS | Status: DC
  Administered 2015-10-18: 5450 mg via INTRAVENOUS
  Filled 2015-10-18: qty 109

## 2015-10-18 MED ORDER — SODIUM CHLORIDE 0.9 % IV SOLN
10.0000 mg | Freq: Once | INTRAVENOUS | Status: AC
  Administered 2015-10-18: 10 mg via INTRAVENOUS
  Filled 2015-10-18: qty 1

## 2015-10-18 MED ORDER — SODIUM CHLORIDE 0.9 % IJ SOLN
10.0000 mL | INTRAMUSCULAR | Status: DC | PRN
  Administered 2015-10-18: 10 mL via INTRAVENOUS
  Filled 2015-10-18: qty 10

## 2015-10-18 MED ORDER — PALONOSETRON HCL INJECTION 0.25 MG/5ML
INTRAVENOUS | Status: AC
  Filled 2015-10-18: qty 5

## 2015-10-18 MED ORDER — OXALIPLATIN CHEMO INJECTION 100 MG/20ML
87.0000 mg/m2 | Freq: Once | INTRAVENOUS | Status: AC
  Administered 2015-10-18: 200 mg via INTRAVENOUS
  Filled 2015-10-18: qty 40

## 2015-10-18 NOTE — Progress Notes (Signed)
Nutrition follow-up completed with patient during infusion for gastric cancer. Weight was documented as 214.7 pounds June 1 decreased from 215.4 pounds but is stable. Patient reports he is drinking boost twice a day. He is trying to eat more. Denies nutrition impact symptoms.  Nutrition diagnosis: Food and nutrition related knowledge deficit resolved.  I enforced importance of continued small frequent meals and snacks with adequate calories and protein. Recommended patient increase boost plus 3 times daily and provided additional coupons. Teach back method was used.  Patient will work to increase calories and protein to promote weight maintenance.  No follow up scheduled.  **Disclaimer: This note was dictated with voice recognition software. Similar sounding words can inadvertently be transcribed and this note may contain transcription errors which may not have been corrected upon publication of note.**

## 2015-10-18 NOTE — Patient Instructions (Signed)
[facility] Discharge Instructions for Patients Receiving Chemotherapy  Today you received the following chemotherapy agents: Oxaliplatin, Leucovorin, and Adrucil.  To help prevent nausea and vomiting after your treatment, we encourage you to take your nausea medication as directed.  If you develop nausea and vomiting that is not controlled by your nausea medication, call the clinic.   BELOW ARE SYMPTOMS THAT SHOULD BE REPORTED IMMEDIATELY:  *FEVER GREATER THAN 100.5 F  *CHILLS WITH OR WITHOUT FEVER  NAUSEA AND VOMITING THAT IS NOT CONTROLLED WITH YOUR NAUSEA MEDICATION  *UNUSUAL SHORTNESS OF BREATH  *UNUSUAL BRUISING OR BLEEDING  TENDERNESS IN MOUTH AND THROAT WITH OR WITHOUT PRESENCE OF ULCERS  *URINARY PROBLEMS  *BOWEL PROBLEMS  UNUSUAL RASH Items with * indicate a potential emergency and should be followed up as soon as possible.  Feel free to call the clinic you have any questions or concerns. The clinic phone number is [phone].  Please show the [facility] at check-in to the Emergency Department and triage nurse.

## 2015-10-18 NOTE — Progress Notes (Signed)
  [facility] OFFICE PROGRESS NOTE   Diagnosis:  Gastric cancer  INTERVAL HISTORY:   [name] returns as scheduled. He completed cycle 6 FOLFOX 10/04/2015. He denies nausea/vomiting. No mouth sores. No diarrhea. Cold sensitivity lasted about 4-5 days. He has stable tingling in the feet. "Every now and then" he notes a small amount of blood when he blows his nose. No other bleeding.  Objective:  Vital signs in last 24 hours:  Blood pressure 144/60, pulse 76, temperature 97.9 F (36.6 C), temperature source Oral, resp. rate 18, height '6\' 2"'$  (1.88 m), weight 214 lb 11.2 oz (97.387 kg), SpO2 99 %.    HEENT: No thrush or ulcers. Resp: Lungs clear bilaterally. Cardio: Regular rate and rhythm. GI: Abdomen soft and nontender. No hepatomegaly. Vascular: No leg edema. Neuro: Vibratory sense mildly decreased over the fingertips per tuning fork exam.  Skin: Palms with mild hyperpigmentation. Port-A-Cath without erythema.    Lab Results:  Lab Results  Component Value Date   WBC 9.0 10/18/2015   HGB 12.3* 10/18/2015   HCT 38.8 10/18/2015   MCV 77.6* 10/18/2015   PLT 108* 10/18/2015   NEUTROABS 6.6* 10/18/2015    Imaging:  No results found.  Medications: I have reviewed the patient's current medications.  Assessment/Plan: 1. Gastric Cancer  Lesser curvature gastric body mass noted on endoscopy 06/27/2015, biopsy confirmed adenocarcinoma  Staging CT scans to 06/28/2015 with a gastric body mass, adenopathy adjacent to the stomach, chest adenopathy, a single enhancing liver lesion, and indeterminate nodular pulmonary lesions  PET scan 07/12/2015-hypermetabolic gastric mass, gastrohepatic ligament metastases, metastasis at the left adrenal gland, hypermetabolic right paratracheal, right hilar, and subcarinal nodes moderate metabolic activity involving 2 pulmonary lesions  Cycle 1 FOLFOX 07/19/2015  Cycle 2 FOLFOX 08/02/2015  Cycle 3 FOLFOX 08/23/2015 with  Neulasta support  Cycle 4 FOLFOX 09/06/2015 with Neulasta support  Cycle 5 FOLFOX 09/20/2015 with Neulasta support  Restaging CTs 10/02/2015-decrease in chest and perigastric lymphadenopathy, decrease thickening at the distal stomach, 11 mm sclerotic lesion in the posterior left third rib-barely visible on the PET/CT 07/12/2015  Cycle 6 FOLFOX 10/04/2015  Cycle 7 FOLFOX 10/18/2015  2. COPD  3. Nonischemic cardiomyopathy  4. Implanted cardiac defibrillator  5. History of prostate cancer  6. Hypertension  7. Iron deficiency anemia  8. Neutropenia secondary to chemotherapy-Neulasta added with cycle 3 FOLFOX 08/23/2015  9. Oxaliplatin neuropathy    Disposition: [name] appears well. He has completed 6 cycles of FOLFOX. Plan to proceed with cycle 7 today as scheduled. He will again receive Neulasta support.   He has mild thrombocytopenia. He understands to contact the office with any bleeding.   He has a family reunion in 2 weeks. We decided to adjust cycle 8 to a 3 week interval. We will see him in follow-up prior to cycle 8 on 11/08/2015. He will contact the office in the interim with any problems.  Plan reviewed with Dr. [name].    Ned Card ANP/GNP-BC   10/18/2015  10:02 AM

## 2015-10-18 NOTE — Telephone Encounter (Signed)
Gave pt apt & avs °

## 2015-10-19 DIAGNOSIS — C169 Malignant neoplasm of stomach, unspecified: Secondary | ICD-10-CM | POA: Diagnosis not present

## 2015-10-20 ENCOUNTER — Ambulatory Visit (HOSPITAL_BASED_OUTPATIENT_CLINIC_OR_DEPARTMENT_OTHER): Payer: Commercial Managed Care - HMO

## 2015-10-20 ENCOUNTER — Ambulatory Visit: Payer: Commercial Managed Care - HMO

## 2015-10-20 VITALS — BP 135/67 | HR 75 | Temp 98.5°F

## 2015-10-20 DIAGNOSIS — C162 Malignant neoplasm of body of stomach: Secondary | ICD-10-CM | POA: Diagnosis not present

## 2015-10-20 MED ORDER — SODIUM CHLORIDE 0.9% FLUSH
10.0000 mL | INTRAVENOUS | Status: DC | PRN
  Administered 2015-10-20: 10 mL
  Filled 2015-10-20: qty 10

## 2015-10-20 MED ORDER — PEGFILGRASTIM INJECTION 6 MG/0.6ML ~~LOC~~
6.0000 mg | PREFILLED_SYRINGE | Freq: Once | SUBCUTANEOUS | Status: AC
  Administered 2015-10-20: 6 mg via SUBCUTANEOUS

## 2015-10-20 MED ORDER — HEPARIN SOD (PORK) LOCK FLUSH 100 UNIT/ML IV SOLN
500.0000 [IU] | Freq: Once | INTRAVENOUS | Status: AC | PRN
  Administered 2015-10-20: 500 [IU]
  Filled 2015-10-20: qty 5

## 2015-10-20 NOTE — Progress Notes (Signed)
Duplicate

## 2015-10-22 LAB — CUP PACEART REMOTE DEVICE CHECK
Battery Remaining Longevity: 59 mo
Battery Voltage: 2.99 V
Brady Statistic AP VS Percent: 1.9 %
Brady Statistic AS VP Percent: 64 %
Date Time Interrogation Session: 20170613060015
HIGH POWER IMPEDANCE MEASURED VALUE: 69 Ohm
HighPow Impedance: 69 Ohm
Implantable Lead Implant Date: 20160314
Implantable Lead Location: 753858
Implantable Lead Location: 753860
Lead Channel Impedance Value: 400 Ohm
Lead Channel Impedance Value: 460 Ohm
Lead Channel Pacing Threshold Amplitude: 1.375 V
Lead Channel Pacing Threshold Pulse Width: 0.7 ms
Lead Channel Sensing Intrinsic Amplitude: 12 mV
Lead Channel Sensing Intrinsic Amplitude: 5 mV
Lead Channel Setting Pacing Amplitude: 2 V
Lead Channel Setting Pacing Amplitude: 2.375
Lead Channel Setting Pacing Pulse Width: 0.5 ms
Lead Channel Setting Pacing Pulse Width: 0.7 ms
MDC IDC LEAD IMPLANT DT: 20160314
MDC IDC LEAD IMPLANT DT: 20160314
MDC IDC LEAD LOCATION: 753859
MDC IDC LEAD MODEL: 7122
MDC IDC MSMT BATTERY REMAINING PERCENTAGE: 76 %
MDC IDC MSMT LEADCHNL LV IMPEDANCE VALUE: 530 Ohm
MDC IDC MSMT LEADCHNL RV PACING THRESHOLD AMPLITUDE: 0.5 V
MDC IDC MSMT LEADCHNL RV PACING THRESHOLD PULSEWIDTH: 0.5 ms
MDC IDC PG SERIAL: 7239374
MDC IDC SET LEADCHNL RA PACING AMPLITUDE: 2 V
MDC IDC SET LEADCHNL RV SENSING SENSITIVITY: 0.5 mV
MDC IDC STAT BRADY AP VP PERCENT: 25 %
MDC IDC STAT BRADY AS VS PERCENT: 6.1 %
MDC IDC STAT BRADY RA PERCENT PACED: 23 %

## 2015-10-24 ENCOUNTER — Encounter: Payer: Self-pay | Admitting: Cardiology

## 2015-11-01 ENCOUNTER — Ambulatory Visit: Payer: Commercial Managed Care - HMO | Admitting: Oncology

## 2015-11-01 ENCOUNTER — Other Ambulatory Visit: Payer: Commercial Managed Care - HMO

## 2015-11-01 ENCOUNTER — Ambulatory Visit: Payer: Commercial Managed Care - HMO

## 2015-11-02 NOTE — Telephone Encounter (Signed)
Medications were discussed and adjusted at 08/10/15 follow up appt with [facility] Patient Instructions     Sample and script Breo 100 Inhale 1 puff then rinse mouth, once daily Instead of Dulera  Script for hydromet cough syrup to use if needed  Please call if we can help   Nothing further needed.

## 2015-11-03 ENCOUNTER — Ambulatory Visit: Payer: Commercial Managed Care - HMO

## 2015-11-08 ENCOUNTER — Ambulatory Visit (HOSPITAL_BASED_OUTPATIENT_CLINIC_OR_DEPARTMENT_OTHER): Payer: Commercial Managed Care - HMO | Admitting: Nurse Practitioner

## 2015-11-08 ENCOUNTER — Telehealth: Payer: Self-pay | Admitting: *Deleted

## 2015-11-08 ENCOUNTER — Ambulatory Visit (HOSPITAL_BASED_OUTPATIENT_CLINIC_OR_DEPARTMENT_OTHER): Payer: Commercial Managed Care - HMO

## 2015-11-08 ENCOUNTER — Other Ambulatory Visit: Payer: Commercial Managed Care - HMO

## 2015-11-08 ENCOUNTER — Other Ambulatory Visit (HOSPITAL_BASED_OUTPATIENT_CLINIC_OR_DEPARTMENT_OTHER): Payer: Commercial Managed Care - HMO

## 2015-11-08 ENCOUNTER — Other Ambulatory Visit: Payer: Self-pay | Admitting: Oncology

## 2015-11-08 ENCOUNTER — Telehealth: Payer: Self-pay | Admitting: Nurse Practitioner

## 2015-11-08 VITALS — BP 155/74 | HR 78 | Temp 98.4°F | Resp 18 | Ht 74.0 in | Wt 218.4 lb

## 2015-11-08 DIAGNOSIS — C169 Malignant neoplasm of stomach, unspecified: Secondary | ICD-10-CM

## 2015-11-08 DIAGNOSIS — C162 Malignant neoplasm of body of stomach: Secondary | ICD-10-CM

## 2015-11-08 DIAGNOSIS — Z5111 Encounter for antineoplastic chemotherapy: Secondary | ICD-10-CM

## 2015-11-08 DIAGNOSIS — D509 Iron deficiency anemia, unspecified: Secondary | ICD-10-CM

## 2015-11-08 DIAGNOSIS — D701 Agranulocytosis secondary to cancer chemotherapy: Secondary | ICD-10-CM | POA: Diagnosis not present

## 2015-11-08 DIAGNOSIS — Z452 Encounter for adjustment and management of vascular access device: Secondary | ICD-10-CM | POA: Diagnosis not present

## 2015-11-08 DIAGNOSIS — Z8546 Personal history of malignant neoplasm of prostate: Secondary | ICD-10-CM

## 2015-11-08 DIAGNOSIS — Z95828 Presence of other vascular implants and grafts: Secondary | ICD-10-CM

## 2015-11-08 DIAGNOSIS — C7972 Secondary malignant neoplasm of left adrenal gland: Secondary | ICD-10-CM

## 2015-11-08 DIAGNOSIS — G620 Drug-induced polyneuropathy: Secondary | ICD-10-CM | POA: Diagnosis not present

## 2015-11-08 DIAGNOSIS — I10 Essential (primary) hypertension: Secondary | ICD-10-CM

## 2015-11-08 LAB — CBC WITH DIFFERENTIAL/PLATELET
BASO%: 0.3 % (ref 0.0–2.0)
BASOS ABS: 0 10*3/uL (ref 0.0–0.1)
EOS%: 0.8 % (ref 0.0–7.0)
Eosinophils Absolute: 0.1 10*3/uL (ref 0.0–0.5)
HEMATOCRIT: 38.2 % — AB (ref 38.4–49.9)
HEMOGLOBIN: 12.5 g/dL — AB (ref 13.0–17.1)
LYMPH#: 1.2 10*3/uL (ref 0.9–3.3)
LYMPH%: 19.5 % (ref 14.0–49.0)
MCH: 26.1 pg — AB (ref 27.2–33.4)
MCHC: 32.7 g/dL (ref 32.0–36.0)
MCV: 79.7 fL (ref 79.3–98.0)
MONO#: 1.1 10*3/uL — AB (ref 0.1–0.9)
MONO%: 17.6 % — ABNORMAL HIGH (ref 0.0–14.0)
NEUT#: 3.7 10*3/uL (ref 1.5–6.5)
NEUT%: 61.8 % (ref 39.0–75.0)
Platelets: 161 10*3/uL (ref 140–400)
RBC: 4.79 10*6/uL (ref 4.20–5.82)
RDW: 24.4 % — ABNORMAL HIGH (ref 11.0–14.6)
WBC: 6 10*3/uL (ref 4.0–10.3)
nRBC: 0 % (ref 0–0)

## 2015-11-08 LAB — COMPREHENSIVE METABOLIC PANEL
ALBUMIN: 3.2 g/dL — AB (ref 3.5–5.0)
ALK PHOS: 144 U/L (ref 40–150)
ALT: 27 U/L (ref 0–55)
ANION GAP: 8 meq/L (ref 3–11)
AST: 34 U/L (ref 5–34)
BUN: 9.8 mg/dL (ref 7.0–26.0)
CALCIUM: 9.3 mg/dL (ref 8.4–10.4)
CO2: 24 mEq/L (ref 22–29)
Chloride: 107 mEq/L (ref 98–109)
Creatinine: 0.9 mg/dL (ref 0.7–1.3)
Glucose: 131 mg/dl (ref 70–140)
Potassium: 3.8 mEq/L (ref 3.5–5.1)
Sodium: 139 mEq/L (ref 136–145)
TOTAL PROTEIN: 7 g/dL (ref 6.4–8.3)

## 2015-11-08 MED ORDER — SODIUM CHLORIDE 0.9 % IJ SOLN
10.0000 mL | INTRAMUSCULAR | Status: DC | PRN
  Administered 2015-11-08: 10 mL via INTRAVENOUS
  Filled 2015-11-08: qty 10

## 2015-11-08 MED ORDER — LEUCOVORIN CALCIUM INJECTION 350 MG
400.0000 mg/m2 | Freq: Once | INTRAVENOUS | Status: AC
  Administered 2015-11-08: 912 mg via INTRAVENOUS
  Filled 2015-11-08: qty 45.6

## 2015-11-08 MED ORDER — FLUOROURACIL CHEMO INJECTION 5 GM/100ML
2400.0000 mg/m2 | INTRAVENOUS | Status: DC
  Administered 2015-11-08: 5450 mg via INTRAVENOUS
  Filled 2015-11-08: qty 109

## 2015-11-08 MED ORDER — DEXTROSE 5 % IV SOLN
Freq: Once | INTRAVENOUS | Status: AC
  Administered 2015-11-08: 12:00:00 via INTRAVENOUS

## 2015-11-08 MED ORDER — FLUOROURACIL CHEMO INJECTION 2.5 GM/50ML
400.0000 mg/m2 | Freq: Once | INTRAVENOUS | Status: AC
  Administered 2015-11-08: 900 mg via INTRAVENOUS
  Filled 2015-11-08: qty 18

## 2015-11-08 NOTE — Telephone Encounter (Signed)
[  per po to sch pt appt-pt to gt updated copy b4 leaving

## 2015-11-08 NOTE — Patient Instructions (Signed)

## 2015-11-08 NOTE — Progress Notes (Signed)
  [name] OFFICE PROGRESS NOTE   Diagnosis:  Gastric cancer  INTERVAL HISTORY:   [name] returns as scheduled. He completed cycle 7 FOLFOX 10/18/2015. He denies nausea/vomiting. No mouth sores. No diarrhea. He has persistent mild cold sensitivity in the fingertips. He notes some difficulty buttoning his shirt. Toes feel "tight".  Objective:  Vital signs in last 24 hours:  Blood pressure 155/74, pulse 78, temperature 98.4 F (36.9 C), temperature source Oral, resp. rate 18, height '6\' 2"'$  (1.88 m), weight 218 lb 6.4 oz (99.066 kg), SpO2 98 %.    HEENT: No thrush or ulcers. Resp: Lungs clear bilaterally. Cardio: Regular rate and rhythm. GI: Abdomen soft and nontender. No hepatomegaly. Vascular: Trace bilateral pretibial/ankle edema. Neuro: Vibratory sense mildly to moderately decreased over the fingertips per tuning fork exam.  Skin: Hands with hyperpigmentation. Port-A-Cath without erythema.    Lab Results:  Lab Results  Component Value Date   WBC 6.0 11/08/2015   HGB 12.5* 11/08/2015   HCT 38.2* 11/08/2015   MCV 79.7 11/08/2015   PLT 161 11/08/2015   NEUTROABS 3.7 11/08/2015    Imaging:  No results found.  Medications: I have reviewed the patient's current medications.  Assessment/Plan: 1. Gastric Cancer  Lesser curvature gastric body mass noted on endoscopy 06/27/2015, biopsy confirmed adenocarcinoma  Staging CT scans to 06/28/2015 with a gastric body mass, adenopathy adjacent to the stomach, chest adenopathy, a single enhancing liver lesion, and indeterminate nodular pulmonary lesions  PET scan 07/12/2015-hypermetabolic gastric mass, gastrohepatic ligament metastases, metastasis at the left adrenal gland, hypermetabolic right paratracheal, right hilar, and subcarinal nodes moderate metabolic activity involving 2 pulmonary lesions  Cycle 1 FOLFOX 07/19/2015  Cycle 2 FOLFOX 08/02/2015  Cycle 3 FOLFOX 08/23/2015 with Neulasta  support  Cycle 4 FOLFOX 09/06/2015 with Neulasta support  Cycle 5 FOLFOX 09/20/2015 with Neulasta support  Restaging CTs 10/02/2015-decrease in chest and perigastric lymphadenopathy, decrease thickening at the distal stomach, 11 mm sclerotic lesion in the posterior left third rib-barely visible on the PET/CT 07/12/2015  Cycle 6 FOLFOX 10/04/2015  Cycle 7 FOLFOX 10/18/2015  Cycle 8 FOLFOX 11/08/2015 (oxaliplatin held due to neuropathy)  2. COPD  3. Nonischemic cardiomyopathy  4. Implanted cardiac defibrillator  5. History of prostate cancer  6. Hypertension  7. Iron deficiency anemia  8. Neutropenia secondary to chemotherapy-Neulasta added with cycle 3 FOLFOX 08/23/2015  9. Oxaliplatin neuropathy   Disposition: [name] appears stable. He has completed 7 cycles of FOLFOX. He has progressive neuropathy symptoms interfering with activity. Plan to proceed with cycle 8 FOLFOX today as scheduled but will hold the oxaliplatin. He will not require Neulasta with this cycle.  He will return for follow-up visit and cycle 9 FOLFOX in 2 weeks. He will contact the office in the interim with any problems.  Plan reviewed with Dr. [name].    Ned Card ANP/GNP-BC   11/08/2015  11:12 AM

## 2015-11-08 NOTE — Patient Instructions (Signed)
[facility] Discharge Instructions for Patients Receiving Chemotherapy  Today you received the following chemotherapy agents 5 FU/Leucovorin To help prevent nausea and vomiting after your treatment, we encourage you to take your nausea medication as prescribed. If you develop nausea and vomiting that is not controlled by your nausea medication, call the clinic.   BELOW ARE SYMPTOMS THAT SHOULD BE REPORTED IMMEDIATELY:  *FEVER GREATER THAN 100.5 F  *CHILLS WITH OR WITHOUT FEVER  NAUSEA AND VOMITING THAT IS NOT CONTROLLED WITH YOUR NAUSEA MEDICATION  *UNUSUAL SHORTNESS OF BREATH  *UNUSUAL BRUISING OR BLEEDING  TENDERNESS IN MOUTH AND THROAT WITH OR WITHOUT PRESENCE OF ULCERS  *URINARY PROBLEMS  *BOWEL PROBLEMS  UNUSUAL RASH Items with * indicate a potential emergency and should be followed up as soon as possible.  Feel free to call the clinic you have any questions or concerns. The clinic phone number is [phone].  Please show the [facility] at check-in to the Emergency Department and triage nurse.

## 2015-11-08 NOTE — Telephone Encounter (Signed)
Per staff message and POF I have scheduled appts. Advised scheduler of appts. JMW  

## 2015-11-10 ENCOUNTER — Ambulatory Visit: Payer: Commercial Managed Care - HMO

## 2015-11-10 ENCOUNTER — Ambulatory Visit (HOSPITAL_BASED_OUTPATIENT_CLINIC_OR_DEPARTMENT_OTHER): Payer: Commercial Managed Care - HMO

## 2015-11-10 VITALS — BP 140/71 | HR 75 | Temp 98.3°F | Resp 18

## 2015-11-10 DIAGNOSIS — C162 Malignant neoplasm of body of stomach: Secondary | ICD-10-CM

## 2015-11-10 DIAGNOSIS — Z452 Encounter for adjustment and management of vascular access device: Secondary | ICD-10-CM | POA: Diagnosis not present

## 2015-11-10 MED ORDER — HEPARIN SOD (PORK) LOCK FLUSH 100 UNIT/ML IV SOLN
500.0000 [IU] | Freq: Once | INTRAVENOUS | Status: AC | PRN
  Administered 2015-11-10: 500 [IU]
  Filled 2015-11-10: qty 5

## 2015-11-10 MED ORDER — SODIUM CHLORIDE 0.9% FLUSH
10.0000 mL | INTRAVENOUS | Status: DC | PRN
  Administered 2015-11-10: 10 mL
  Filled 2015-11-10: qty 10

## 2015-11-16 ENCOUNTER — Other Ambulatory Visit: Payer: Self-pay | Admitting: Internal Medicine

## 2015-11-17 ENCOUNTER — Other Ambulatory Visit: Payer: Self-pay | Admitting: Oncology

## 2015-11-18 DIAGNOSIS — C169 Malignant neoplasm of stomach, unspecified: Secondary | ICD-10-CM | POA: Diagnosis not present

## 2015-11-22 ENCOUNTER — Ambulatory Visit (HOSPITAL_BASED_OUTPATIENT_CLINIC_OR_DEPARTMENT_OTHER): Payer: Commercial Managed Care - HMO

## 2015-11-22 ENCOUNTER — Other Ambulatory Visit (HOSPITAL_BASED_OUTPATIENT_CLINIC_OR_DEPARTMENT_OTHER): Payer: Commercial Managed Care - HMO

## 2015-11-22 ENCOUNTER — Encounter: Payer: Self-pay | Admitting: Internal Medicine

## 2015-11-22 ENCOUNTER — Ambulatory Visit (HOSPITAL_BASED_OUTPATIENT_CLINIC_OR_DEPARTMENT_OTHER): Payer: Commercial Managed Care - HMO | Admitting: Oncology

## 2015-11-22 VITALS — BP 167/76 | HR 76 | Temp 97.9°F | Resp 18 | Ht 74.0 in | Wt 222.9 lb

## 2015-11-22 DIAGNOSIS — I10 Essential (primary) hypertension: Secondary | ICD-10-CM

## 2015-11-22 DIAGNOSIS — C162 Malignant neoplasm of body of stomach: Secondary | ICD-10-CM

## 2015-11-22 DIAGNOSIS — G620 Drug-induced polyneuropathy: Secondary | ICD-10-CM

## 2015-11-22 DIAGNOSIS — D509 Iron deficiency anemia, unspecified: Secondary | ICD-10-CM | POA: Diagnosis not present

## 2015-11-22 DIAGNOSIS — Z5111 Encounter for antineoplastic chemotherapy: Secondary | ICD-10-CM

## 2015-11-22 DIAGNOSIS — Z95828 Presence of other vascular implants and grafts: Secondary | ICD-10-CM

## 2015-11-22 DIAGNOSIS — Z452 Encounter for adjustment and management of vascular access device: Secondary | ICD-10-CM

## 2015-11-22 DIAGNOSIS — D701 Agranulocytosis secondary to cancer chemotherapy: Secondary | ICD-10-CM

## 2015-11-22 DIAGNOSIS — C169 Malignant neoplasm of stomach, unspecified: Secondary | ICD-10-CM

## 2015-11-22 LAB — COMPREHENSIVE METABOLIC PANEL
ALT: 27 U/L (ref 0–55)
ANION GAP: 6 meq/L (ref 3–11)
AST: 34 U/L (ref 5–34)
Albumin: 3.3 g/dL — ABNORMAL LOW (ref 3.5–5.0)
Alkaline Phosphatase: 122 U/L (ref 40–150)
BILIRUBIN TOTAL: 0.44 mg/dL (ref 0.20–1.20)
BUN: 8.9 mg/dL (ref 7.0–26.0)
CALCIUM: 9.1 mg/dL (ref 8.4–10.4)
CO2: 25 meq/L (ref 22–29)
CREATININE: 0.9 mg/dL (ref 0.7–1.3)
Chloride: 110 mEq/L — ABNORMAL HIGH (ref 98–109)
EGFR: >90 mL/min/{1.73_m2} (ref >90)
Glucose: 100 mg/dl (ref 70–140)
Potassium: 3.9 mEq/L (ref 3.5–5.1)
Sodium: 141 mEq/L (ref 136–145)
TOTAL PROTEIN: 7.1 g/dL (ref 6.4–8.3)

## 2015-11-22 LAB — CBC WITH DIFFERENTIAL/PLATELET
BASO%: 0.6 % (ref 0.0–2.0)
Basophils Absolute: 0 10*3/uL (ref 0.0–0.1)
EOS ABS: 0.1 10*3/uL (ref 0.0–0.5)
EOS%: 2.1 % (ref 0.0–7.0)
HEMATOCRIT: 38.1 % — AB (ref 38.4–49.9)
HGB: 12.6 g/dL — ABNORMAL LOW (ref 13.0–17.1)
LYMPH#: 0.8 10*3/uL — AB (ref 0.9–3.3)
LYMPH%: 25.1 % (ref 14.0–49.0)
MCH: 26.8 pg — ABNORMAL LOW (ref 27.2–33.4)
MCHC: 33.1 g/dL (ref 32.0–36.0)
MCV: 81.1 fL (ref 79.3–98.0)
MONO#: 0.7 10*3/uL (ref 0.1–0.9)
MONO%: 21.8 % — ABNORMAL HIGH (ref 0.0–14.0)
NEUT%: 50.4 % (ref 39.0–75.0)
NEUTROS ABS: 1.7 10*3/uL (ref 1.5–6.5)
NRBC: 0 % (ref 0–0)
PLATELETS: 133 10*3/uL — AB (ref 140–400)
RBC: 4.7 10*6/uL (ref 4.20–5.82)
RDW: 22.2 % — AB (ref 11.0–14.6)
WBC: 3.4 10*3/uL — AB (ref 4.0–10.3)

## 2015-11-22 MED ORDER — SODIUM CHLORIDE 0.9 % IV SOLN
2400.0000 mg/m2 | INTRAVENOUS | Status: DC
  Administered 2015-11-22: 5450 mg via INTRAVENOUS
  Filled 2015-11-22: qty 109

## 2015-11-22 MED ORDER — LEUCOVORIN CALCIUM INJECTION 350 MG
400.0000 mg/m2 | Freq: Once | INTRAVENOUS | Status: AC
  Administered 2015-11-22: 912 mg via INTRAVENOUS
  Filled 2015-11-22: qty 45.6

## 2015-11-22 MED ORDER — SODIUM CHLORIDE 0.9 % IV SOLN
INTRAVENOUS | Status: DC
  Administered 2015-11-22: 11:00:00 via INTRAVENOUS

## 2015-11-22 MED ORDER — FLUOROURACIL CHEMO INJECTION 2.5 GM/50ML
400.0000 mg/m2 | Freq: Once | INTRAVENOUS | Status: AC
  Administered 2015-11-22: 900 mg via INTRAVENOUS
  Filled 2015-11-22: qty 18

## 2015-11-22 MED ORDER — DEXTROSE 5 % IV SOLN: Freq: Once | INTRAVENOUS | Status: DC

## 2015-11-22 MED ORDER — SODIUM CHLORIDE 0.9 % IJ SOLN
10.0000 mL | INTRAMUSCULAR | Status: DC | PRN
  Administered 2015-11-22: 10 mL via INTRAVENOUS
  Filled 2015-11-22: qty 10

## 2015-11-22 NOTE — Patient Instructions (Signed)

## 2015-11-22 NOTE — Progress Notes (Signed)
  [facility] OFFICE PROGRESS NOTE   Diagnosis: Colon cancer  INTERVAL HISTORY:   [name] returns as scheduled. He completed another cycle of chemotherapy on 11/08/2015. Oxaliplatin was held secondary to neuropathy. He feels well. Good appetite. He continues to have numbness in the fingers and "tightness "in the distal feet and toes. No difficulty with ambulation. He has difficulty buttoning his shirt. The neuropathy symptoms have not changed. No mouth sores or diarrhea.  Objective:  Vital signs in last 24 hours:  Blood pressure 167/76, pulse 76, temperature 97.9 F (36.6 C), temperature source Oral, resp. rate 18, height '6\' 2"'$  (1.88 m), weight 222 lb 14.4 oz (101.107 kg), SpO2 96 %.    HEENT: No thrush or ulcers Resp: Expiratory rhonchi at the right base, no respiratory distress Cardio: Regular rate and rhythm GI: No hepatomegaly, nontender Vascular: No leg edema Neuro: Moderate decrease in vibratory sense at the fingertips bilaterally  Skin: Hyperpigmentation of the hands   Portacath/PICC-without erythema  Lab Results:  Lab Results  Component Value Date   WBC 3.4* 11/22/2015   HGB 12.6* 11/22/2015   HCT 38.1* 11/22/2015   MCV 81.1 11/22/2015   PLT 133* 11/22/2015   NEUTROABS 1.7 11/22/2015    Medications: I have reviewed the patient's current medications.  Assessment/Plan: 1. Gastric Cancer  Lesser curvature gastric body mass noted on endoscopy 06/27/2015, biopsy confirmed adenocarcinoma  Staging CT scans to 06/28/2015 with a gastric body mass, adenopathy adjacent to the stomach, chest adenopathy, a single enhancing liver lesion, and indeterminate nodular pulmonary lesions  PET scan 07/12/2015-hypermetabolic gastric mass, gastrohepatic ligament metastases, metastasis at the left adrenal gland, hypermetabolic right paratracheal, right hilar, and subcarinal nodes moderate metabolic activity involving 2 pulmonary lesions  Cycle 1 FOLFOX  07/19/2015  Cycle 2 FOLFOX 08/02/2015  Cycle 3 FOLFOX 08/23/2015 with Neulasta support  Cycle 4 FOLFOX 09/06/2015 with Neulasta support  Cycle 5 FOLFOX 09/20/2015 with Neulasta support  Restaging CTs 10/02/2015-decrease in chest and perigastric lymphadenopathy, decrease thickening at the distal stomach, 11 mm sclerotic lesion in the posterior left third rib-barely visible on the PET/CT 07/12/2015  Cycle 6 FOLFOX 10/04/2015  Cycle 7 FOLFOX 10/18/2015  Cycle 8 FOLFOX 11/08/2015 (oxaliplatin held due to neuropathy)  Cycle 9 FOLFOX 11/22/2015 (oxaliplatin held secondary to neuropathy)  2. COPD  3. Nonischemic cardiomyopathy  4. Implanted cardiac defibrillator  5. History of prostate cancer  6. Hypertension  7. Iron deficiency anemia  8. Neutropenia secondary to chemotherapy-Neulasta added with cycle 3 FOLFOX 08/23/2015  9. Oxaliplatin neuropathy   Disposition:  [name] appears stable. He continues to have neuropathy symptoms. We decided to hold oxaliplatin with chemotherapy today. He will complete a cycle of 5-fluorouracil today. He will return for an office visit and chemotherapy in 2 weeks. The plan is to refer him for a restaging CT evaluation after the next cycle of chemotherapy.  Betsy Coder, MD  11/22/2015  10:23 AM

## 2015-11-22 NOTE — Patient Instructions (Signed)
[facility] Discharge Instructions for Patients Receiving Chemotherapy  Today you received the following chemotherapy agents: Leucovorin, and Adrucil.  To help prevent nausea and vomiting after your treatment, we encourage you to take your nausea medication as directed.  If you develop nausea and vomiting that is not controlled by your nausea medication, call the clinic.   BELOW ARE SYMPTOMS THAT SHOULD BE REPORTED IMMEDIATELY:  *FEVER GREATER THAN 100.5 F  *CHILLS WITH OR WITHOUT FEVER  NAUSEA AND VOMITING THAT IS NOT CONTROLLED WITH YOUR NAUSEA MEDICATION  *UNUSUAL SHORTNESS OF BREATH  *UNUSUAL BRUISING OR BLEEDING  TENDERNESS IN MOUTH AND THROAT WITH OR WITHOUT PRESENCE OF ULCERS  *URINARY PROBLEMS  *BOWEL PROBLEMS  UNUSUAL RASH Items with * indicate a potential emergency and should be followed up as soon as possible.  Feel free to call the clinic you have any questions or concerns. The clinic phone number is [phone].  Please show the [facility] at check-in to the Emergency Department and triage nurse.

## 2015-11-23 ENCOUNTER — Other Ambulatory Visit: Payer: Self-pay | Admitting: Medical Oncology

## 2015-11-23 ENCOUNTER — Telehealth: Payer: Self-pay | Admitting: Oncology

## 2015-11-23 NOTE — Telephone Encounter (Signed)
Called apt per pof

## 2015-11-23 NOTE — Progress Notes (Signed)
Per Dr [name] -pt is not getting neulasta on sat July 22.-

## 2015-11-24 ENCOUNTER — Ambulatory Visit: Payer: Commercial Managed Care - HMO

## 2015-11-24 ENCOUNTER — Ambulatory Visit (HOSPITAL_BASED_OUTPATIENT_CLINIC_OR_DEPARTMENT_OTHER): Payer: Commercial Managed Care - HMO

## 2015-11-24 VITALS — BP 139/75 | HR 86 | Temp 98.3°F | Resp 18

## 2015-11-24 DIAGNOSIS — Z452 Encounter for adjustment and management of vascular access device: Secondary | ICD-10-CM | POA: Diagnosis not present

## 2015-11-24 DIAGNOSIS — C162 Malignant neoplasm of body of stomach: Secondary | ICD-10-CM | POA: Diagnosis not present

## 2015-11-24 MED ORDER — HEPARIN SOD (PORK) LOCK FLUSH 100 UNIT/ML IV SOLN
500.0000 [IU] | Freq: Once | INTRAVENOUS | Status: AC | PRN
  Administered 2015-11-24: 500 [IU]
  Filled 2015-11-24: qty 5

## 2015-11-24 MED ORDER — SODIUM CHLORIDE 0.9% FLUSH
10.0000 mL | INTRAVENOUS | Status: DC | PRN
  Administered 2015-11-24: 10 mL
  Filled 2015-11-24: qty 10

## 2015-12-02 ENCOUNTER — Other Ambulatory Visit: Payer: Self-pay | Admitting: Oncology

## 2015-12-06 ENCOUNTER — Other Ambulatory Visit (HOSPITAL_BASED_OUTPATIENT_CLINIC_OR_DEPARTMENT_OTHER): Payer: Commercial Managed Care - HMO

## 2015-12-06 ENCOUNTER — Ambulatory Visit (HOSPITAL_BASED_OUTPATIENT_CLINIC_OR_DEPARTMENT_OTHER): Payer: Commercial Managed Care - HMO | Admitting: Oncology

## 2015-12-06 ENCOUNTER — Ambulatory Visit (HOSPITAL_BASED_OUTPATIENT_CLINIC_OR_DEPARTMENT_OTHER): Payer: Commercial Managed Care - HMO

## 2015-12-06 ENCOUNTER — Telehealth: Payer: Self-pay | Admitting: Oncology

## 2015-12-06 VITALS — BP 141/67 | HR 71 | Temp 98.3°F | Resp 18 | Ht 74.0 in | Wt 226.2 lb

## 2015-12-06 DIAGNOSIS — C169 Malignant neoplasm of stomach, unspecified: Secondary | ICD-10-CM

## 2015-12-06 DIAGNOSIS — Z5111 Encounter for antineoplastic chemotherapy: Secondary | ICD-10-CM | POA: Diagnosis not present

## 2015-12-06 DIAGNOSIS — C162 Malignant neoplasm of body of stomach: Secondary | ICD-10-CM

## 2015-12-06 DIAGNOSIS — G620 Drug-induced polyneuropathy: Secondary | ICD-10-CM

## 2015-12-06 DIAGNOSIS — Z8546 Personal history of malignant neoplasm of prostate: Secondary | ICD-10-CM

## 2015-12-06 DIAGNOSIS — D701 Agranulocytosis secondary to cancer chemotherapy: Secondary | ICD-10-CM

## 2015-12-06 DIAGNOSIS — C7972 Secondary malignant neoplasm of left adrenal gland: Secondary | ICD-10-CM | POA: Diagnosis not present

## 2015-12-06 DIAGNOSIS — H9192 Unspecified hearing loss, left ear: Secondary | ICD-10-CM

## 2015-12-06 DIAGNOSIS — D509 Iron deficiency anemia, unspecified: Secondary | ICD-10-CM | POA: Diagnosis not present

## 2015-12-06 LAB — CBC WITH DIFFERENTIAL/PLATELET
BASO%: 0.2 % (ref 0.0–2.0)
Basophils Absolute: 0 10*3/uL (ref 0.0–0.1)
EOS ABS: 0.1 10*3/uL (ref 0.0–0.5)
EOS%: 2.4 % (ref 0.0–7.0)
HCT: 40.2 % (ref 38.4–49.9)
HGB: 13.1 g/dL (ref 13.0–17.1)
LYMPH#: 1.1 10*3/uL (ref 0.9–3.3)
LYMPH%: 27.5 % (ref 14.0–49.0)
MCH: 27.5 pg (ref 27.2–33.4)
MCHC: 32.6 g/dL (ref 32.0–36.0)
MCV: 84.3 fL (ref 79.3–98.0)
MONO#: 0.5 10*3/uL (ref 0.1–0.9)
MONO%: 12.9 % (ref 0.0–14.0)
NEUT%: 57 % (ref 39.0–75.0)
NEUTROS ABS: 2.3 10*3/uL (ref 1.5–6.5)
NRBC: 0 % (ref 0–0)
PLATELETS: 130 10*3/uL — AB (ref 140–400)
RBC: 4.77 10*6/uL (ref 4.20–5.82)
RDW: 20.9 % — AB (ref 11.0–14.6)
WBC: 4.1 10*3/uL (ref 4.0–10.3)

## 2015-12-06 LAB — COMPREHENSIVE METABOLIC PANEL
ALT: 33 U/L (ref 0–55)
ANION GAP: 9 meq/L (ref 3–11)
AST: 37 U/L — ABNORMAL HIGH (ref 5–34)
Albumin: 3.5 g/dL (ref 3.5–5.0)
Alkaline Phosphatase: 136 U/L (ref 40–150)
BILIRUBIN TOTAL: 0.56 mg/dL (ref 0.20–1.20)
BUN: 9.1 mg/dL (ref 7.0–26.0)
CO2: 23 meq/L (ref 22–29)
CREATININE: 1.1 mg/dL (ref 0.7–1.3)
Calcium: 9.6 mg/dL (ref 8.4–10.4)
Chloride: 108 mEq/L (ref 98–109)
EGFR: 73 mL/min/{1.73_m2} — ABNORMAL LOW (ref >90)
GLUCOSE: 103 mg/dL (ref 70–140)
Potassium: 4.1 mEq/L (ref 3.5–5.1)
Sodium: 141 mEq/L (ref 136–145)
TOTAL PROTEIN: 7.3 g/dL (ref 6.4–8.3)

## 2015-12-06 MED ORDER — SODIUM CHLORIDE 0.9 % IV SOLN
2400.0000 mg/m2 | INTRAVENOUS | Status: DC
  Administered 2015-12-06: 5450 mg via INTRAVENOUS
  Filled 2015-12-06: qty 109

## 2015-12-06 MED ORDER — LEUCOVORIN CALCIUM INJECTION 350 MG
400.0000 mg/m2 | Freq: Once | INTRAVENOUS | Status: AC
  Administered 2015-12-06: 912 mg via INTRAVENOUS
  Filled 2015-12-06: qty 45.6

## 2015-12-06 MED ORDER — HEPARIN SOD (PORK) LOCK FLUSH 100 UNIT/ML IV SOLN
500.0000 [IU] | Freq: Once | INTRAVENOUS | Status: DC | PRN
  Filled 2015-12-06: qty 5

## 2015-12-06 MED ORDER — SODIUM CHLORIDE 0.9% FLUSH
10.0000 mL | INTRAVENOUS | Status: DC | PRN
  Filled 2015-12-06: qty 10

## 2015-12-06 MED ORDER — FLUOROURACIL CHEMO INJECTION 2.5 GM/50ML
400.0000 mg/m2 | Freq: Once | INTRAVENOUS | Status: AC
  Administered 2015-12-06: 900 mg via INTRAVENOUS
  Filled 2015-12-06: qty 18

## 2015-12-06 MED ORDER — DEXTROSE 5 % IV SOLN
Freq: Once | INTRAVENOUS | Status: AC
  Administered 2015-12-06: 10:00:00 via INTRAVENOUS

## 2015-12-06 MED ORDER — PALONOSETRON HCL INJECTION 0.25 MG/5ML
INTRAVENOUS | Status: AC
  Filled 2015-12-06: qty 5

## 2015-12-06 NOTE — Progress Notes (Signed)
Patient called at home to remind him that his pump D/C will be at 11:00 am on Saturday 12/08/15

## 2015-12-06 NOTE — Telephone Encounter (Signed)
Gv pt appt for 8/25 and gv contrast for ct. Advised Radiology will call for ct scan and that MD req appt with ENT. We will call him later with that appt.

## 2015-12-06 NOTE — Progress Notes (Signed)
  [facility] OFFICE PROGRESS NOTE   Diagnosis: Gastric cancer  INTERVAL HISTORY:   Mr. Mccorkel completed another cycle of chemotherapy 11/22/2015. He tolerated the chemotherapy well. Oxaliplatin was deleted from the chemotherapy regimen. He noted some improvement in numbness of the fingers. He complains of hearing loss in the left ear. Good appetite.  Objective:  Vital signs in last 24 hours:  Blood pressure (!) 141/67, pulse 71, temperature 98.3 F (36.8 C), temperature source Oral, resp. rate 18, height '6\' 2"'$  (1.88 m), weight 226 lb 3.2 oz (102.6 kg), SpO2 97 %.    HEENT: No thrush or ulcers, cerumen in the left greater than right external canal Resp: Lungs clear bilaterally Cardio: Regular rate and rhythm GI: No hepatosplenomegaly, nontender Vascular: No leg edema Neuro: Moderate decrease in vibratory sense at the fingertips bilaterally. Decreased air conduction at the left ear   Portacath/PICC-without erythema  Lab Results:  Lab Results  Component Value Date   WBC 4.1 12/06/2015   HGB 13.1 12/06/2015   HCT 40.2 12/06/2015   MCV 84.3 12/06/2015   PLT 130 (L) 12/06/2015   NEUTROABS 2.3 12/06/2015    Lab Results  Component Value Date   CEA1 3.4 07/19/2015     Medications: I have reviewed the patient's current medications.  Assessment/Plan: 1. Gastric Cancer ? Lesser curvature gastric body mass noted on endoscopy 06/27/2015, biopsy confirmed adenocarcinoma ? Staging CT scans to 06/28/2015 with a gastric body mass, adenopathy adjacent to the stomach, chest adenopathy, a single enhancing liver lesion, and indeterminate nodular pulmonary lesions ? PET scan 07/12/2015-hypermetabolic gastric mass, gastrohepatic ligament metastases, metastasis at the left adrenal gland, hypermetabolic right paratracheal, right hilar, and subcarinal nodes moderate metabolic activity involving 2 pulmonary lesions ? Cycle 1 FOLFOX 07/19/2015 ? Cycle 2 FOLFOX  08/02/2015 ? Cycle 3 FOLFOX 08/23/2015 with Neulasta support ? Cycle 4 FOLFOX 09/06/2015 with Neulasta support ? Cycle 5 FOLFOX 09/20/2015 with Neulasta support ? Restaging CTs 10/02/2015-decrease in chest and perigastric lymphadenopathy, decrease thickening at the distal stomach, 11 mm sclerotic lesion in the posterior left third rib-barely visible on the PET/CT 07/12/2015 ? Cycle 6 FOLFOX 10/04/2015 ? Cycle 7 FOLFOX 10/18/2015 ? Cycle 8 FOLFOX 11/08/2015 (oxaliplatin held due to neuropathy) ? Cycle 9 FOLFOX 11/22/2015 (oxaliplatin held secondary to neuropathy) ? Cycle 10 FOLFOX 12/06/2015 (oxaliplatin held secondary to neuropathy)  2. COPD  3. Nonischemic cardiomyopathy  4. Implanted cardiac defibrillator  5. History of prostate cancer  6. Hypertension  7. Iron deficiency anemia  8. Neutropenia secondary to chemotherapy-Neulasta added with cycle 3 FOLFOX 08/23/2015  9. Oxaliplatin neuropathy  10. Left ear hearing loss-referred to ENT    Disposition:  His overall status appears unchanged. He will complete cycle 10 chemotherapy today. Oxaliplatin will remain on hold secondary to neuropathy. The left earache hearing loss may be related to cerumen or less likely oxaliplatin. I referred him to ENT.  [name] will be scheduled for a restaging CT evaluation after this cycle of chemotherapy. He will return for an office visit on 12/28/2015.  Betsy Coder, MD  12/06/2015  9:36 AM

## 2015-12-08 ENCOUNTER — Ambulatory Visit (HOSPITAL_BASED_OUTPATIENT_CLINIC_OR_DEPARTMENT_OTHER): Payer: Commercial Managed Care - HMO

## 2015-12-08 VITALS — BP 143/58 | HR 73 | Temp 98.7°F | Resp 18

## 2015-12-08 DIAGNOSIS — C162 Malignant neoplasm of body of stomach: Secondary | ICD-10-CM

## 2015-12-08 DIAGNOSIS — Z452 Encounter for adjustment and management of vascular access device: Secondary | ICD-10-CM

## 2015-12-08 MED ORDER — HEPARIN SOD (PORK) LOCK FLUSH 100 UNIT/ML IV SOLN
500.0000 [IU] | Freq: Once | INTRAVENOUS | Status: AC | PRN
  Administered 2015-12-08: 500 [IU]
  Filled 2015-12-08: qty 5

## 2015-12-08 MED ORDER — SODIUM CHLORIDE 0.9% FLUSH
10.0000 mL | INTRAVENOUS | Status: DC | PRN
  Administered 2015-12-08: 10 mL
  Filled 2015-12-08: qty 10

## 2015-12-10 DIAGNOSIS — C169 Malignant neoplasm of stomach, unspecified: Secondary | ICD-10-CM | POA: Diagnosis not present

## 2015-12-23 ENCOUNTER — Other Ambulatory Visit: Payer: Self-pay | Admitting: Oncology

## 2015-12-24 ENCOUNTER — Ambulatory Visit (HOSPITAL_COMMUNITY)
Admission: RE | Admit: 2015-12-24 | Discharge: 2015-12-24 | Disposition: A | Discharge status: Home / Self Care | Payer: Commercial Managed Care - HMO | Source: Ambulatory Visit | Attending: Oncology | Admitting: Oncology

## 2015-12-24 DIAGNOSIS — C162 Malignant neoplasm of body of stomach: Secondary | ICD-10-CM | POA: Insufficient documentation

## 2015-12-24 DIAGNOSIS — R918 Other nonspecific abnormal finding of lung field: Secondary | ICD-10-CM | POA: Insufficient documentation

## 2015-12-24 DIAGNOSIS — K769 Liver disease, unspecified: Secondary | ICD-10-CM | POA: Insufficient documentation

## 2015-12-24 DIAGNOSIS — K4090 Unilateral inguinal hernia, without obstruction or gangrene, not specified as recurrent: Secondary | ICD-10-CM | POA: Insufficient documentation

## 2015-12-24 DIAGNOSIS — K449 Diaphragmatic hernia without obstruction or gangrene: Secondary | ICD-10-CM | POA: Insufficient documentation

## 2015-12-24 DIAGNOSIS — C269 Malignant neoplasm of ill-defined sites within the digestive system: Secondary | ICD-10-CM | POA: Diagnosis not present

## 2015-12-24 MED ORDER — IOPAMIDOL (ISOVUE-300) INJECTION 61%
100.0000 mL | Freq: Once | INTRAVENOUS | Status: AC | PRN
  Administered 2015-12-24: 100 mL via INTRAVENOUS

## 2015-12-28 ENCOUNTER — Ambulatory Visit (HOSPITAL_BASED_OUTPATIENT_CLINIC_OR_DEPARTMENT_OTHER): Payer: Commercial Managed Care - HMO | Admitting: Oncology

## 2015-12-28 ENCOUNTER — Telehealth: Payer: Self-pay | Admitting: Oncology

## 2015-12-28 VITALS — BP 144/74 | HR 70 | Temp 98.0°F | Resp 18 | Ht 74.0 in | Wt 227.4 lb

## 2015-12-28 DIAGNOSIS — D509 Iron deficiency anemia, unspecified: Secondary | ICD-10-CM | POA: Diagnosis not present

## 2015-12-28 DIAGNOSIS — C7972 Secondary malignant neoplasm of left adrenal gland: Secondary | ICD-10-CM

## 2015-12-28 DIAGNOSIS — C162 Malignant neoplasm of body of stomach: Secondary | ICD-10-CM

## 2015-12-28 DIAGNOSIS — I10 Essential (primary) hypertension: Secondary | ICD-10-CM

## 2015-12-28 DIAGNOSIS — I428 Other cardiomyopathies: Secondary | ICD-10-CM

## 2015-12-28 DIAGNOSIS — D701 Agranulocytosis secondary to cancer chemotherapy: Secondary | ICD-10-CM

## 2015-12-28 DIAGNOSIS — Z8546 Personal history of malignant neoplasm of prostate: Secondary | ICD-10-CM

## 2015-12-28 DIAGNOSIS — G620 Drug-induced polyneuropathy: Secondary | ICD-10-CM

## 2015-12-28 NOTE — Progress Notes (Signed)
[facility] OFFICE PROGRESS NOTE   Diagnosis: Gastric cancer  INTERVAL HISTORY:   [name] returns as scheduled. He continues to have neuropathy symptoms in the hands and feet. He completed another cycle of 5-fluorouracil on 12/06/2015. No complaint today.  Objective:  Vital signs in last 24 hours:  Blood pressure (!) 144/74, pulse 70, temperature 98 F (36.7 C), temperature source Oral, resp. rate 18, height '6\' 2"'$  (1.88 m), weight 227 lb 6.4 oz (103.1 kg), SpO2 98 %.    HEENT: No thrush or ulcers, hyperpigmentation of the buccal mucosa Lymphatics: No cervical, supraclavicular, or axillary nodes Resp: Lungs clear bilaterally Cardio: Irregular GI: No hepatosplenomegaly, no mass, nontender Vascular: No leg edema  Skin: Hyperpigmentation of the hands   Portacath/PICC-without erythema  Lab Results:  Lab Results  Component Value Date   WBC 4.1 12/06/2015   HGB 13.1 12/06/2015   HCT 40.2 12/06/2015   MCV 84.3 12/06/2015   PLT 130 (L) 12/06/2015   NEUTROABS 2.3 12/06/2015     Lab Results  Component Value Date   CEA1 3.4 07/19/2015    Imaging:  Ct Chest W Contrast  Result Date: 12/24/2015 CLINICAL DATA:  Restaging gastric cancer EXAM: CT CHEST, ABDOMEN, AND PELVIS WITH CONTRAST TECHNIQUE: Multidetector CT imaging of the chest, abdomen and pelvis was performed following the standard protocol during bolus administration of intravenous contrast. CONTRAST:  128m ISOVUE-300 IOPAMIDOL (ISOVUE-300) INJECTION 61% COMPARISON:  10/02/2015. FINDINGS: CT CHEST FINDINGS Mediastinum/Lymph Nodes: Normal heart size. No pericardial effusion identified. Aortic atherosclerosis noted. Calcification in the LAD and RCA coronary artery is identified. The sub- carinal lymph node measures 1 cm, image 31 of series 2. Unchanged from previous exam. There is a right paratracheal lymph node which measures 9 mm, image 21 of series 2. Unchanged from previous exam. Right hilar node  measures 1.5 cm, image 28 of series 2. Stable from previous study. There is a left hilar node which measures 8 mm, image 32 of series 2. Previously this measured the same. Lungs/Pleura: There is no pleural effusion identified. Moderate to advanced changes of centrilobular and paraseptal emphysema identified. 7 mm spiculated nodule within the right upper lobe is unchanged, image 39 of series 5. There is a subpleural nodule within the anterior medial right upper lobe which measures 1 cm, image 22 of series 5. Also unchanged from previous exam. Stable 8 mm nodule in the right base, image 124 of series 5. Musculoskeletal: No aggressive lytic bone lesions. The previously referenced sclerotic lesion involving the left third rib is unchanged, image 28 of series 5. CT ABDOMEN PELVIS FINDINGS Hepatobiliary: The previously referenced hypervascular lesion within the medial segment of left lobe of liver is again identified and appears unchanged measuring 1 cm, image 50 Pancreas: No mass, inflammatory changes, or other significant abnormality. Spleen: Within normal limits in size and appearance. Adrenals/Urinary Tract: The adrenal glands are normal. There is a cyst identified within the right kidney which measures 1.5 cm, image 70 of series 2. The left kidney is negative. Stomach/Bowel: Hiatal hernia is identified. The small bowel loops have a normal course and caliber. The appendix is visualized and appears normal. Distal colonic diverticula are noted without acute inflammation. Vascular/Lymphatic: Calcified atherosclerotic disease involves the abdominal aorta. No aneurysm. No enlarged retroperitoneal or mesenteric adenopathy. No enlarged pelvic or inguinal lymph nodes. Reproductive: No mass or other significant abnormality. Other: There is a fat containing right inguinal hernia. Musculoskeletal:  No suspicious bone lesions identified. IMPRESSION: 1. No acute findings  within the chest abdomen or pelvis. 2. Small pulmonary  nodules are unchanged when compared with previous exam. 3. Stable appearance of hypervascular lesion within medial segment of left lobe of liver. 4. Small hiatal hernia and right inguinal hernia. Electronically Signed   By: Kerby Moors M.D.   On: 12/24/2015 17:48   Ct Abdomen Pelvis W Contrast  Result Date: 12/24/2015 CLINICAL DATA:  Restaging gastric cancer EXAM: CT CHEST, ABDOMEN, AND PELVIS WITH CONTRAST TECHNIQUE: Multidetector CT imaging of the chest, abdomen and pelvis was performed following the standard protocol during bolus administration of intravenous contrast. CONTRAST:  170m ISOVUE-300 IOPAMIDOL (ISOVUE-300) INJECTION 61% COMPARISON:  10/02/2015. FINDINGS: CT CHEST FINDINGS Mediastinum/Lymph Nodes: Normal heart size. No pericardial effusion identified. Aortic atherosclerosis noted. Calcification in the LAD and RCA coronary artery is identified. The sub- carinal lymph node measures 1 cm, image 31 of series 2. Unchanged from previous exam. There is a right paratracheal lymph node which measures 9 mm, image 21 of series 2. Unchanged from previous exam. Right hilar node measures 1.5 cm, image 28 of series 2. Stable from previous study. There is a left hilar node which measures 8 mm, image 32 of series 2. Previously this measured the same. Lungs/Pleura: There is no pleural effusion identified. Moderate to advanced changes of centrilobular and paraseptal emphysema identified. 7 mm spiculated nodule within the right upper lobe is unchanged, image 39 of series 5. There is a subpleural nodule within the anterior medial right upper lobe which measures 1 cm, image 22 of series 5. Also unchanged from previous exam. Stable 8 mm nodule in the right base, image 124 of series 5. Musculoskeletal: No aggressive lytic bone lesions. The previously referenced sclerotic lesion involving the left third rib is unchanged, image 28 of series 5. CT ABDOMEN PELVIS FINDINGS Hepatobiliary: The previously referenced  hypervascular lesion within the medial segment of left lobe of liver is again identified and appears unchanged measuring 1 cm, image 50 Pancreas: No mass, inflammatory changes, or other significant abnormality. Spleen: Within normal limits in size and appearance. Adrenals/Urinary Tract: The adrenal glands are normal. There is a cyst identified within the right kidney which measures 1.5 cm, image 70 of series 2. The left kidney is negative. Stomach/Bowel: Hiatal hernia is identified. The small bowel loops have a normal course and caliber. The appendix is visualized and appears normal. Distal colonic diverticula are noted without acute inflammation. Vascular/Lymphatic: Calcified atherosclerotic disease involves the abdominal aorta. No aneurysm. No enlarged retroperitoneal or mesenteric adenopathy. No enlarged pelvic or inguinal lymph nodes. Reproductive: No mass or other significant abnormality. Other: There is a fat containing right inguinal hernia. Musculoskeletal:  No suspicious bone lesions identified. IMPRESSION: 1. No acute findings within the chest abdomen or pelvis. 2. Small pulmonary nodules are unchanged when compared with previous exam. 3. Stable appearance of hypervascular lesion within medial segment of left lobe of liver. 4. Small hiatal hernia and right inguinal hernia. Electronically Signed   By: TKerby MoorsM.D.   On: 12/24/2015 17:48   CT images reviewed with the patient and his family.  Medications: I have reviewed the patient's current medications.  Assessment/Plan: 1. Gastric Cancer ? Lesser curvature gastric body mass noted on endoscopy 06/27/2015, biopsy confirmed adenocarcinoma ? Staging CT scans to 06/28/2015 with a gastric body mass, adenopathy adjacent to the stomach, chest adenopathy, a single enhancing liver lesion, and indeterminate nodular pulmonary lesions ? PET scan 07/12/2015-hypermetabolic gastric mass, gastrohepatic ligament metastases, metastasis at the left adrenal  gland, hypermetabolic  right paratracheal, right hilar, and subcarinal nodes moderate metabolic activity involving 2 pulmonary lesions ? Cycle 1 FOLFOX 07/19/2015 ? Cycle 2 FOLFOX 08/02/2015 ? Cycle 3 FOLFOX 08/23/2015 with Neulasta support ? Cycle 4 FOLFOX 09/06/2015 with Neulasta support ? Cycle 5 FOLFOX 09/20/2015 with Neulasta support ? Restaging CTs 10/02/2015-decrease in chest and perigastric lymphadenopathy, decrease thickening at the distal stomach, 11 mm sclerotic lesion in the posterior left third rib-barely visible on the PET/CT 07/12/2015 ? Cycle 6 FOLFOX 10/04/2015 ? Cycle 7 FOLFOX 10/18/2015 ? Cycle 8 FOLFOX 11/08/2015 (oxaliplatin held due to neuropathy) ? Cycle 9 FOLFOX 11/22/2015 (oxaliplatin held secondary to neuropathy) ? Cycle 10 FOLFOX 12/06/2015 (oxaliplatin held secondary to neuropathy) ? CTs 12/24/2015-stable lung nodules and mediastinal lymph nodes, no evidence of disease progression  2. COPD  3. Nonischemic cardiomyopathy  4. Implanted cardiac defibrillator  5. History of prostate cancer  6. Hypertension  7. Iron deficiency anemia  8. Neutropenia secondary to chemotherapy-Neulasta added with cycle 3 FOLFOX 08/23/2015  9. Oxaliplatin neuropathy  10. Left ear hearing loss-referred to ENT    Disposition:  Mr. Pasqual has been maintained on systemic therapy since March of this year. There has been no clinical or CT evidence of disease progression. He appears asymptomatic from the gastric cancer at present. There was a response to therapy on the initial restaging CT.  I discussed treatment options with Mr. Ginley and his family. I do not recommend further oxaliplatin based on his neuropathy symptoms. No therapy will be curative. We decided to give him a treatment break. He will return for a Port-A-Cath flush 01/17/2016. He will be scheduled for an office visit and CBC on 02/28/2016. We will consider salvage systemic therapy  options when there is clinical evidence of disease progression.  Betsy Coder, MD  12/28/2015  12:04 PM

## 2015-12-28 NOTE — Telephone Encounter (Signed)
GAVE PATIENT AVS REPORT AND APPOINTMENTS FOR September AND October  °

## 2016-01-01 ENCOUNTER — Other Ambulatory Visit: Payer: Self-pay | Admitting: Gastroenterology

## 2016-01-01 ENCOUNTER — Other Ambulatory Visit: Payer: Self-pay | Admitting: Internal Medicine

## 2016-01-15 ENCOUNTER — Ambulatory Visit (INDEPENDENT_AMBULATORY_CARE_PROVIDER_SITE_OTHER): Payer: Commercial Managed Care - HMO | Admitting: *Deleted

## 2016-01-15 ENCOUNTER — Telehealth: Payer: Self-pay | Admitting: Cardiology

## 2016-01-15 DIAGNOSIS — I428 Other cardiomyopathies: Secondary | ICD-10-CM

## 2016-01-15 DIAGNOSIS — I429 Cardiomyopathy, unspecified: Secondary | ICD-10-CM | POA: Diagnosis not present

## 2016-01-15 DIAGNOSIS — I5022 Chronic systolic (congestive) heart failure: Secondary | ICD-10-CM

## 2016-01-15 NOTE — Progress Notes (Signed)
Remote ICD transmission.   

## 2016-01-15 NOTE — Telephone Encounter (Signed)
Spoke with pt and reminded pt of remote transmission that is due today. Pt verbalized understanding.   

## 2016-01-17 ENCOUNTER — Encounter: Payer: Self-pay | Admitting: Cardiology

## 2016-01-22 ENCOUNTER — Ambulatory Visit (HOSPITAL_BASED_OUTPATIENT_CLINIC_OR_DEPARTMENT_OTHER): Payer: Commercial Managed Care - HMO

## 2016-01-22 ENCOUNTER — Telehealth: Payer: Self-pay | Admitting: Oncology

## 2016-01-22 DIAGNOSIS — C162 Malignant neoplasm of body of stomach: Secondary | ICD-10-CM | POA: Diagnosis not present

## 2016-01-22 DIAGNOSIS — Z452 Encounter for adjustment and management of vascular access device: Secondary | ICD-10-CM

## 2016-01-22 DIAGNOSIS — C169 Malignant neoplasm of stomach, unspecified: Secondary | ICD-10-CM

## 2016-01-22 DIAGNOSIS — Z95828 Presence of other vascular implants and grafts: Secondary | ICD-10-CM

## 2016-01-22 MED ORDER — HEPARIN SOD (PORK) LOCK FLUSH 100 UNIT/ML IV SOLN
500.0000 [IU] | Freq: Once | INTRAVENOUS | Status: AC | PRN
  Administered 2016-01-22: 500 [IU] via INTRAVENOUS
  Filled 2016-01-22: qty 5

## 2016-01-22 MED ORDER — SODIUM CHLORIDE 0.9 % IJ SOLN
10.0000 mL | INTRAMUSCULAR | Status: DC | PRN
  Administered 2016-01-22: 10 mL via INTRAVENOUS
  Filled 2016-01-22: qty 10

## 2016-01-22 NOTE — Patient Instructions (Signed)

## 2016-01-22 NOTE — Telephone Encounter (Signed)
Patient missed his 09/14 flush appointment, requested to reschedule to 09/19 at 3 PM

## 2016-01-25 LAB — CUP PACEART REMOTE DEVICE CHECK
Battery Voltage: 2.99 V
Brady Statistic AP VP Percent: 40 %
Brady Statistic AP VS Percent: 1.8 %
Brady Statistic AS VP Percent: 51 %
Brady Statistic RA Percent Paced: 39 %
Date Time Interrogation Session: 20170912153642
HighPow Impedance: 70 Ohm
HighPow Impedance: 70 Ohm
Implantable Lead Implant Date: 20160314
Implantable Lead Location: 753858
Implantable Lead Location: 753859
Lead Channel Impedance Value: 400 Ohm
Lead Channel Impedance Value: 490 Ohm
Lead Channel Pacing Threshold Amplitude: 0.5 V
Lead Channel Pacing Threshold Amplitude: 1.125 V
Lead Channel Pacing Threshold Pulse Width: 0.5 ms
Lead Channel Pacing Threshold Pulse Width: 0.5 ms
Lead Channel Sensing Intrinsic Amplitude: 12 mV
Lead Channel Setting Pacing Amplitude: 2 V
Lead Channel Setting Pacing Amplitude: 2.125
Lead Channel Setting Pacing Pulse Width: 0.5 ms
MDC IDC LEAD IMPLANT DT: 20160314
MDC IDC LEAD IMPLANT DT: 20160314
MDC IDC LEAD LOCATION: 753860
MDC IDC LEAD MODEL: 7122
MDC IDC MSMT BATTERY REMAINING LONGEVITY: 56 mo
MDC IDC MSMT BATTERY REMAINING PERCENTAGE: 72 %
MDC IDC MSMT LEADCHNL LV PACING THRESHOLD PULSEWIDTH: 0.7 ms
MDC IDC MSMT LEADCHNL RA PACING THRESHOLD AMPLITUDE: 1 V
MDC IDC MSMT LEADCHNL RA SENSING INTR AMPL: 5 mV
MDC IDC MSMT LEADCHNL RV IMPEDANCE VALUE: 530 Ohm
MDC IDC PG SERIAL: 7239374
MDC IDC SET LEADCHNL LV PACING PULSEWIDTH: 0.7 ms
MDC IDC SET LEADCHNL RV PACING AMPLITUDE: 2 V
MDC IDC SET LEADCHNL RV SENSING SENSITIVITY: 0.5 mV
MDC IDC STAT BRADY AS VS PERCENT: 4.2 %

## 2016-01-29 ENCOUNTER — Telehealth: Payer: Self-pay

## 2016-02-11 ENCOUNTER — Encounter: Payer: Self-pay | Admitting: Internal Medicine

## 2016-02-11 ENCOUNTER — Ambulatory Visit (INDEPENDENT_AMBULATORY_CARE_PROVIDER_SITE_OTHER): Payer: Commercial Managed Care - HMO | Admitting: Internal Medicine

## 2016-02-11 DIAGNOSIS — J449 Chronic obstructive pulmonary disease, unspecified: Secondary | ICD-10-CM

## 2016-02-11 MED ORDER — BUDESONIDE-FORMOTEROL FUMARATE 160-4.5 MCG/ACT IN AERO: INHALATION_SPRAY | RESPIRATORY_TRACT | 12 refills | Status: DC

## 2016-02-11 NOTE — Progress Notes (Signed)
HPI   M former smoker  yoM re-establishing for COPD                          09/18/14- - 78 yoM  former smoker followed for COPD, complicated by DM2, CAD/CM/ AICD/ pacer, anemia/ FE          PCP Dr Darene Lamer. Ronnald Ramp Follows For: Pt states breathing is doing fine. He c/o of a prod cough with yellow thick mucus, he says cough worsens at bedtime and early mornings. SOB with exertion. At night he notices postnasal drip, sleeping on 2 pillows. Some cough. Denies fever, headache or sore throat. His cough at night bothers his wife. CXR 07/27/14- IMPRESSION: No change in position of the AICD and pacer leads when compared to prior chest x-ray. No active cardiopulmonary disease. Electronically Signed  By: Ivar Drape M.D.  On: 07/27/2014 14:17  02/09/15-  89 yoM  former smoker followed for COPD, complicated by DM2, CAD/CM/ AICD/ pacer, anemia/ FE  PCP Dr Darene Lamer. Ronnald Ramp FOLLOWS FOR:pt. states he has occ. SOB. no wheezing. denies chest pain or tightness. no cough or congestion. Cough has been much better since last visit. We reviewed his vaccination history. He has had enough pneumococcal vaccine.  08/10/2015-81 year old male former smoker followed for COPD, complicated by DM 2, CAD/CM/AICD/pacer, anemia/FE, gastric cancer FOLLOWS FOR: Pt has not had any inahler use since first of March 2017. Insurance will not cover Dulera-phone note suggests having patient use Breo100 , Symbicort 160, or Advair 250. No changes in breathing since last visit.  Chemotherapy for gastric cancer. Still taking Uniphyl one half tablet daily-discussed. Occasional cough-asks refill cough syrup. Little mucus. No blood. Some postnasal drip at night.  02/11/2016-81 year old male former smoker followed for COPD, complicated by DM 2, CAD/CM/AICD/pacer, anemia/FE, gastric cancer FOLLOWS FOR pt has shortness of breath with uphill. BREO not covered by insurance will need a change  Not anemic on CBC 12/06/2015. FEV1 60% 02/07/14 Continues uniphyll 1/2  twice daily He felt less dyspnea on exertion with Breo but is overall stable. Little cough or wheeze. CT chest 01/18/2016 IMPRESSION: 1. No acute findings within the chest abdomen or pelvis. 2. Small pulmonary nodules are unchanged when compared with previous exam. 3. Stable appearance of hypervascular lesion within medial segment of left lobe of liver. 4. Small hiatal hernia and right inguinal hernia. Electronically Signed: By: Kerby Moors M.D. On: 12/24/2015 17:48  ROS-see HPI Constitutional:   No-   weight loss, night sweats, fevers, chills, fatigue, lassitude. HEENT:   No-  headaches, difficulty swallowing, tooth/dental problems, sore throat,       No-  sneezing, itching, ear ache, +nasal congestion, post nasal drip,  CV:  No-   chest pain, orthopnea, PND, swelling in lower extremities, anasarca, dizziness, palpitations Resp: + shortness of breath with exertion or at rest.              productive cough,  + non-productive cough,  No- coughing up of blood.                change in color of mucus.  No- wheezing.   Skin: No-   rash or lesions. GI:  No-   heartburn, indigestion, abdominal pain, nausea, vomiting, GU:  MS:  No-   joint pain or swelling.   Neuro-     nothing unusual Psych:  No- change in mood or affect. No depression or anxiety.  No memory loss.  OBJ- Physical Exam  General- Alert, Oriented, Affect-appropriate, Distress- none acute Skin- rash-none, lesions- none, excoriation- none Lymphadenopathy- none Head- atraumatic            Eyes- Gross vision intact, PERRLA, conjunctivae and secretions clear            Ears- Hearing, canals-normal            Nose- Clear, no-Septal dev, mucus, polyps, erosion, perforation             Throat- Mallampati III , mucosa clear , drainage -none, tonsils- atrophic Neck- flexible , trachea midline, no stridor , thyroid nl, carotid no bruit Chest - symmetrical excursion , unlabored           Heart/CV- RRR , no murmur , no gallop  ,  no rub, nl s1 s2                           - JVD- none , edema- none, stasis changes- none, varices- none           Lung- + clear, wheeze- none, cough -none , dullness-none, rub- none           Chest wall-  L  AICD, R chest port Abd-  Br/ Gen/ Rectal- Not done, not indicated Extrem- cyanosis- none, clubbing, none, atrophy- none, strength- nl Neuro- grossly intact to observation

## 2016-02-11 NOTE — Patient Instructions (Signed)
Sample and script printed for Symbicort 160 maintenance inhaler     Inhale 2 puffs then rinse mouth, twice daily  Try this instead of Breo   Please call as needed

## 2016-02-12 NOTE — Assessment & Plan Note (Signed)
COPD mixed type with bronchitic component less prominent today. He reports symptomatic benefit from [place] but insurance apparently won't cover that. Through computer formulary I don't find any LABA/LAMA that his insurance covers so we will try Symbicort.

## 2016-02-13 ENCOUNTER — Other Ambulatory Visit: Payer: Self-pay | Admitting: Internal Medicine

## 2016-02-18 ENCOUNTER — Ambulatory Visit (INDEPENDENT_AMBULATORY_CARE_PROVIDER_SITE_OTHER): Payer: Commercial Managed Care - HMO | Admitting: Internal Medicine

## 2016-02-18 ENCOUNTER — Other Ambulatory Visit (INDEPENDENT_AMBULATORY_CARE_PROVIDER_SITE_OTHER): Payer: Commercial Managed Care - HMO

## 2016-02-18 ENCOUNTER — Encounter: Payer: Self-pay | Admitting: Internal Medicine

## 2016-02-18 ENCOUNTER — Other Ambulatory Visit: Payer: Self-pay | Admitting: Internal Medicine

## 2016-02-18 VITALS — BP 164/86 | HR 79 | Temp 98.0°F | Resp 16 | Ht 74.0 in | Wt 236.0 lb

## 2016-02-18 DIAGNOSIS — J449 Chronic obstructive pulmonary disease, unspecified: Secondary | ICD-10-CM

## 2016-02-18 DIAGNOSIS — K2100 Gastro-esophageal reflux disease with esophagitis, without bleeding: Secondary | ICD-10-CM

## 2016-02-18 DIAGNOSIS — I10 Essential (primary) hypertension: Secondary | ICD-10-CM

## 2016-02-18 DIAGNOSIS — Z8546 Personal history of malignant neoplasm of prostate: Secondary | ICD-10-CM

## 2016-02-18 DIAGNOSIS — E118 Type 2 diabetes mellitus with unspecified complications: Secondary | ICD-10-CM

## 2016-02-18 DIAGNOSIS — Z23 Encounter for immunization: Secondary | ICD-10-CM | POA: Diagnosis not present

## 2016-02-18 LAB — MICROALBUMIN / CREATININE URINE RATIO
CREATININE, U: 219 mg/dL
MICROALB UR: 99.2 mg/dL — AB (ref 0.0–1.9)
Microalb Creat Ratio: 45.3 mg/g — ABNORMAL HIGH (ref 0.0–30.0)

## 2016-02-18 LAB — TSH: TSH: 2.36 u[IU]/mL (ref 0.35–4.50)

## 2016-02-18 LAB — COMPREHENSIVE METABOLIC PANEL
ALBUMIN: 4.3 g/dL (ref 3.5–5.2)
ALK PHOS: 121 U/L — AB (ref 39–117)
ALT: 13 U/L (ref 0–53)
AST: 21 U/L (ref 0–37)
BILIRUBIN TOTAL: 0.4 mg/dL (ref 0.2–1.2)
BUN: 15 mg/dL (ref 6–23)
CALCIUM: 9.4 mg/dL (ref 8.4–10.5)
CO2: 29 mEq/L (ref 19–32)
Chloride: 105 mEq/L (ref 96–112)
Creatinine, Ser: 1.26 mg/dL (ref 0.40–1.50)
GFR: 71.02 mL/min (ref >60.00)
GLUCOSE: 98 mg/dL (ref 70–99)
POTASSIUM: 4.2 meq/L (ref 3.5–5.1)
Sodium: 142 mEq/L (ref 135–145)
TOTAL PROTEIN: 7.5 g/dL (ref 6.0–8.3)

## 2016-02-18 LAB — URINALYSIS, ROUTINE W REFLEX MICROSCOPIC
BILIRUBIN URINE: NEGATIVE
KETONES UR: NEGATIVE
Leukocytes, UA: NEGATIVE
Nitrite: NEGATIVE
PH: 6 (ref 5.0–8.0)
Specific Gravity, Urine: 1.025 (ref 1.000–1.030)
UROBILINOGEN UA: 1 (ref 0.0–1.0)
Urine Glucose: NEGATIVE

## 2016-02-18 LAB — HEMOGLOBIN A1C: HEMOGLOBIN A1C: 6.2 % (ref 4.6–6.5)

## 2016-02-18 LAB — PSA: PSA: 0.18 ng/mL (ref 0.10–4.00)

## 2016-02-18 MED ORDER — OMEPRAZOLE 40 MG PO CPDR: 40.0000 mg | DELAYED_RELEASE_CAPSULE | Freq: Every day | ORAL | 3 refills | Status: AC

## 2016-02-18 MED ORDER — OMEPRAZOLE 20 MG PO CPDR: 20.0000 mg | DELAYED_RELEASE_CAPSULE | Freq: Two times a day (BID) | ORAL | 2 refills | Status: DC

## 2016-02-18 MED ORDER — CARVEDILOL 6.25 MG PO TABS: 6.2500 mg | ORAL_TABLET | Freq: Two times a day (BID) | ORAL | 5 refills | Status: DC

## 2016-02-18 NOTE — Progress Notes (Signed)
Subjective:  Patient ID: [name], male    DOB: [birth year]  Age: 81 y.o. MRN: [id number]  CC: Hypertension; Diabetes; and COPD   HPI [name] presents for Follow-up. He is recovering from treatment for gastric cancer and tells me he's been feeling much better. He does complain of chronic cough with shortness of breath is productive of clear phlegm. Looks like his pulmonologist restarted theophylline about a week ago. He also uses Symbicort and says it helps significantly with his symptom control. He thinks his blood sugars have been well controlled as he has had no repeat episodes of visual disturbance, polyuria, polydipsia, or polyphagia. He is concerned that his blood pressure is not been well controlled but fortunately he has had no episodes of headache/blurred vision/chest pain/diaphoresis/or palpitations.  Outpatient Medications Prior to Visit  Medication Sig Dispense Refill  . acetaminophen (TYLENOL) 500 MG tablet Take 1,000 mg by mouth daily as needed for fever or headache.    Marland Kitchen amLODipine (NORVASC) 10 MG tablet Take 1 tablet (10 mg total) by mouth daily. 90 tablet 3  . atorvastatin (LIPITOR) 40 MG tablet TAKE 1 TABLET BY MOUTH DAILY 90 tablet 3  . budesonide-formoterol (SYMBICORT) 160-4.5 MCG/ACT inhaler Inhale 2 puffs then rinse mouth, twice daily 1 Inhaler 12  . Fe Cbn-Fe Gluc-FA-B12-C-DSS (FERRALET 90) 90-1 MG TABS Take 1 tablet by mouth daily.  11  . lidocaine-prilocaine (EMLA) cream Apply 1 application topically as needed. Apply to [facility] 1 hour prior to stick and cover w/plastic wrap 30 g 6  . Polyethyl Glycol-Propyl Glycol (SYSTANE OP) Place 1 drop into both eyes daily.     . potassium chloride SA (K-DUR,KLOR-CON) 20 MEQ tablet TAKE 1 TABLET BY MOUTH EVERY DAY 90 tablet 1  . theophylline (UNIPHYL) 400 MG 24 hr tablet TAKE 1/2 TABLET BY MOUTH TWICE DAILY WITH FOOD 30 tablet 0  . valsartan (DIOVAN) 320 MG tablet Take 1 tablet (320 mg total) by mouth daily. 90  tablet 3  . carvedilol (COREG) 3.125 MG tablet TAKE 1 TABLET(3.125 MG) BY MOUTH TWICE DAILY WITH A MEAL 60 tablet 11  . HYDROcodone-acetaminophen (NORCO/VICODIN) 5-325 MG tablet Take 1-2 tablets by mouth every 4 (four) hours as needed for moderate pain or severe pain. 20 tablet 0  . HYDROcodone-homatropine (HYCODAN) 5-1.5 MG/5ML syrup Take 5 mLs by mouth every 6 (six) hours as needed for cough. 240 mL 0  . omeprazole (PRILOSEC) 20 MG capsule TAKE ONE CAPSULE BY MOUTH TWICE DAILY BEFORE A MEAL 30 capsule 2  . prochlorperazine (COMPAZINE) 10 MG tablet Take 1 tablet (10 mg total) by mouth every 6 (six) hours as needed for nausea. 60 tablet 1   Facility-Administered Medications Prior to Visit  Medication Dose Route Frequency Provider Last Rate Last Dose  . alteplase (CATHFLO ACTIVASE) injection 2 mg  2 mg Intracatheter Once PRN Ladell Pier, MD      . heparin lock flush 100 unit/mL  250 Units Intracatheter Once PRN Ladell Pier, MD      . heparin lock flush 100 unit/mL  500 Units Intracatheter Once PRN Ladell Pier, MD      . sodium chloride flush (NS) 0.9 % injection 10 mL  10 mL Intracatheter PRN Ladell Pier, MD      . sodium chloride flush (NS) 0.9 % injection 3 mL  3 mL Intravenous PRN Ladell Pier, MD        ROS Review of Systems  Constitutional: Negative for activity  change, appetite change, chills, diaphoresis, fatigue, fever and unexpected weight change.  HENT: Negative.  Negative for sinus pressure, sore throat and trouble swallowing.   Eyes: Negative.  Negative for visual disturbance.  Respiratory: Positive for cough and shortness of breath. Negative for apnea, choking, chest tightness, wheezing and stridor.   Cardiovascular: Negative.  Negative for chest pain, palpitations and leg swelling.  Gastrointestinal: Negative.  Negative for abdominal pain, blood in stool, constipation, diarrhea, nausea and vomiting.  Endocrine: Negative.  Negative for cold intolerance, heat  intolerance, polydipsia, polyphagia and polyuria.  Genitourinary: Negative.  Negative for difficulty urinating and dysuria.  Musculoskeletal: Negative.  Negative for arthralgias and myalgias.  Skin: Negative.   Allergic/Immunologic: Negative.   Neurological: Negative.  Negative for dizziness.  Hematological: Negative.  Negative for adenopathy. Does not bruise/bleed easily.  Psychiatric/Behavioral: Negative.     Objective:  BP (!) 164/86 (BP Location: Left Arm, Patient Position: Sitting, Cuff Size: Normal)   Pulse 79   Temp 98 F (36.7 C) (Oral)   Resp 16   Ht '6\' 2"'$  (1.88 m)   Wt 236 lb (107 kg)   SpO2 94%   BMI 30.30 kg/m   BP Readings from Last 3 Encounters:  02/18/16 (!) 164/86  02/11/16 124/68  12/28/15 (!) 144/74    Wt Readings from Last 3 Encounters:  02/18/16 236 lb (107 kg)  02/11/16 234 lb 6.4 oz (106.3 kg)  12/28/15 227 lb 6.4 oz (103.1 kg)    Physical Exam  Constitutional: He is oriented to person, place, and time. No distress.  HENT:  Mouth/Throat: Oropharynx is clear and moist. No oropharyngeal exudate.  Eyes: Conjunctivae are normal. Right eye exhibits no discharge. Left eye exhibits no discharge. No scleral icterus.  Neck: Normal range of motion. Neck supple. No JVD present. No tracheal deviation present. No thyromegaly present.  Cardiovascular: Normal rate, regular rhythm, normal heart sounds and intact distal pulses.  Exam reveals no gallop and no friction rub.   No murmur heard. Pulmonary/Chest: Effort normal and breath sounds normal. No accessory muscle usage or stridor. No tachypnea. No respiratory distress. He has no decreased breath sounds. He has no wheezes. He has no rhonchi. He has no rales. He exhibits no tenderness.  Abdominal: Soft. Bowel sounds are normal. He exhibits no distension and no mass. There is no tenderness. There is no rebound and no guarding.  Musculoskeletal: Normal range of motion. He exhibits no edema, tenderness or deformity.    Lymphadenopathy:    He has no cervical adenopathy.  Neurological: He is oriented to person, place, and time.  Skin: Skin is warm and dry. No rash noted. He is not diaphoretic. No erythema. No pallor.  Psychiatric: He has a normal mood and affect. His behavior is normal. Judgment normal.  Vitals reviewed.   Lab Results  Component Value Date   WBC 4.1 12/06/2015   HGB 13.1 12/06/2015   HCT 40.2 12/06/2015   PLT 130 (L) 12/06/2015   GLUCOSE 98 02/18/2016   CHOL 99 06/04/2015   TRIG 63.0 06/04/2015   HDL 36.50 (L) 06/04/2015   LDLCALC 50 06/04/2015   ALT 13 02/18/2016   AST 21 02/18/2016   NA 142 02/18/2016   K 4.2 02/18/2016   CL 105 02/18/2016   CREATININE 1.26 02/18/2016   BUN 15 02/18/2016   CO2 29 02/18/2016   TSH 2.36 02/18/2016   PSA 0.18 02/18/2016   INR 1.0 01/02/2014   HGBA1C 6.2 02/18/2016   MICROALBUR 99.2 (H) 02/18/2016  Ct Chest W Contrast  Addendum Date: 01/18/2016   ADDENDUM REPORT: 01/18/2016 12:21 ADDENDUM: Stable appearance of irregular wall thickening seen in the distal stomach. No new or progressive mass identified. This area measures approximately 2.5 x 2.7 cm, image 70 of series 2. On the previous exam this measured 2.7 x 2.7 cm. Electronically Signed   By: Kerby Moors M.D.   On: 01/18/2016 12:21   Result Date: 01/18/2016 CLINICAL DATA:  Restaging gastric cancer EXAM: CT CHEST, ABDOMEN, AND PELVIS WITH CONTRAST TECHNIQUE: Multidetector CT imaging of the chest, abdomen and pelvis was performed following the standard protocol during bolus administration of intravenous contrast. CONTRAST:  160m ISOVUE-300 IOPAMIDOL (ISOVUE-300) INJECTION 61% COMPARISON:  10/02/2015. FINDINGS: CT CHEST FINDINGS Mediastinum/Lymph Nodes: Normal heart size. No pericardial effusion identified. Aortic atherosclerosis noted. Calcification in the LAD and RCA coronary artery is identified. The sub- carinal lymph node measures 1 cm, image 31 of series 2. Unchanged from previous  exam. There is a right paratracheal lymph node which measures 9 mm, image 21 of series 2. Unchanged from previous exam. Right hilar node measures 1.5 cm, image 28 of series 2. Stable from previous study. There is a left hilar node which measures 8 mm, image 32 of series 2. Previously this measured the same. Lungs/Pleura: There is no pleural effusion identified. Moderate to advanced changes of centrilobular and paraseptal emphysema identified. 7 mm spiculated nodule within the right upper lobe is unchanged, image 39 of series 5. There is a subpleural nodule within the anterior medial right upper lobe which measures 1 cm, image 22 of series 5. Also unchanged from previous exam. Stable 8 mm nodule in the right base, image 124 of series 5. Musculoskeletal: No aggressive lytic bone lesions. The previously referenced sclerotic lesion involving the left third rib is unchanged, image 28 of series 5. CT ABDOMEN PELVIS FINDINGS Hepatobiliary: The previously referenced hypervascular lesion within the medial segment of left lobe of liver is again identified and appears unchanged measuring 1 cm, image 50 Pancreas: No mass, inflammatory changes, or other significant abnormality. Spleen: Within normal limits in size and appearance. Adrenals/Urinary Tract: The adrenal glands are normal. There is a cyst identified within the right kidney which measures 1.5 cm, image 70 of series 2. The left kidney is negative. Stomach/Bowel: Hiatal hernia is identified. The small bowel loops have a normal course and caliber. The appendix is visualized and appears normal. Distal colonic diverticula are noted without acute inflammation. Vascular/Lymphatic: Calcified atherosclerotic disease involves the abdominal aorta. No aneurysm. No enlarged retroperitoneal or mesenteric adenopathy. No enlarged pelvic or inguinal lymph nodes. Reproductive: No mass or other significant abnormality. Other: There is a fat containing right inguinal hernia.  Musculoskeletal:  No suspicious bone lesions identified. IMPRESSION: 1. No acute findings within the chest abdomen or pelvis. 2. Small pulmonary nodules are unchanged when compared with previous exam. 3. Stable appearance of hypervascular lesion within medial segment of left lobe of liver. 4. Small hiatal hernia and right inguinal hernia. Electronically Signed: By: TKerby MoorsM.D. On: 12/24/2015 17:48   Ct Abdomen Pelvis W Contrast  Addendum Date: 01/18/2016   ADDENDUM REPORT: 01/18/2016 12:21 ADDENDUM: Stable appearance of irregular wall thickening seen in the distal stomach. No new or progressive mass identified. This area measures approximately 2.5 x 2.7 cm, image 70 of series 2. On the previous exam this measured 2.7 x 2.7 cm. Electronically Signed   By: TKerby MoorsM.D.   On: 01/18/2016 12:21   Result Date: 01/18/2016  CLINICAL DATA:  Restaging gastric cancer EXAM: CT CHEST, ABDOMEN, AND PELVIS WITH CONTRAST TECHNIQUE: Multidetector CT imaging of the chest, abdomen and pelvis was performed following the standard protocol during bolus administration of intravenous contrast. CONTRAST:  114m ISOVUE-300 IOPAMIDOL (ISOVUE-300) INJECTION 61% COMPARISON:  10/02/2015. FINDINGS: CT CHEST FINDINGS Mediastinum/Lymph Nodes: Normal heart size. No pericardial effusion identified. Aortic atherosclerosis noted. Calcification in the LAD and RCA coronary artery is identified. The sub- carinal lymph node measures 1 cm, image 31 of series 2. Unchanged from previous exam. There is a right paratracheal lymph node which measures 9 mm, image 21 of series 2. Unchanged from previous exam. Right hilar node measures 1.5 cm, image 28 of series 2. Stable from previous study. There is a left hilar node which measures 8 mm, image 32 of series 2. Previously this measured the same. Lungs/Pleura: There is no pleural effusion identified. Moderate to advanced changes of centrilobular and paraseptal emphysema identified. 7 mm  spiculated nodule within the right upper lobe is unchanged, image 39 of series 5. There is a subpleural nodule within the anterior medial right upper lobe which measures 1 cm, image 22 of series 5. Also unchanged from previous exam. Stable 8 mm nodule in the right base, image 124 of series 5. Musculoskeletal: No aggressive lytic bone lesions. The previously referenced sclerotic lesion involving the left third rib is unchanged, image 28 of series 5. CT ABDOMEN PELVIS FINDINGS Hepatobiliary: The previously referenced hypervascular lesion within the medial segment of left lobe of liver is again identified and appears unchanged measuring 1 cm, image 50 Pancreas: No mass, inflammatory changes, or other significant abnormality. Spleen: Within normal limits in size and appearance. Adrenals/Urinary Tract: The adrenal glands are normal. There is a cyst identified within the right kidney which measures 1.5 cm, image 70 of series 2. The left kidney is negative. Stomach/Bowel: Hiatal hernia is identified. The small bowel loops have a normal course and caliber. The appendix is visualized and appears normal. Distal colonic diverticula are noted without acute inflammation. Vascular/Lymphatic: Calcified atherosclerotic disease involves the abdominal aorta. No aneurysm. No enlarged retroperitoneal or mesenteric adenopathy. No enlarged pelvic or inguinal lymph nodes. Reproductive: No mass or other significant abnormality. Other: There is a fat containing right inguinal hernia. Musculoskeletal:  No suspicious bone lesions identified. IMPRESSION: 1. No acute findings within the chest abdomen or pelvis. 2. Small pulmonary nodules are unchanged when compared with previous exam. 3. Stable appearance of hypervascular lesion within medial segment of left lobe of liver. 4. Small hiatal hernia and right inguinal hernia. Electronically Signed: By: TKerby MoorsM.D. On: 12/24/2015 17:48    Assessment & Plan:   CTrustinwas seen today for  hypertension, diabetes and copd.  Diagnoses and all orders for this visit:  Need for prophylactic vaccination and inoculation against influenza -     Flu vaccine HIGH DOSE PF (Fluzone High dose)  Type 2 diabetes mellitus with complication, without long-term current use of insulin ([place]- his A1c is down to 6.2, his blood sugars are well-controlled on no medications, he will continue to work on his lifestyle modifications. -     Comprehensive metabolic panel; Future -     Hemoglobin A1c; Future -     Microalbumin / creatinine urine ratio; Future -     Ambulatory referral to Ophthalmology  Gastroesophageal reflux disease with esophagitis- his symptoms are well controlled with a PPI -     omeprazole (PRILOSEC) 40 MG capsule; Take 1 capsule (40 mg total)  by mouth daily.  H/O prostate cancer- he has had an insignificant increase in his PSA but his recent scans are negative for any evidence of recurrence of prostate cancer, I will continue to follow this. -     PSA; Future  Essential hypertension- his blood pressure is not well controlled so I made an increase in the dose on his carvedilol. His labs are negative for any secondary causes such as thyroid disease or renal insufficiency, his renal function electrolytes are normal. -     Comprehensive metabolic panel; Future -     TSH; Future -     Urinalysis, Routine w reflex microscopic (not at [facility]); Future -     carvedilol (COREG) 6.25 MG tablet; Take 1 tablet (6.25 mg total) by mouth 2 (two) times daily with a meal.  COPD mixed type ([place])- his theophylline level is undetectable, I've encouraged him to consider restarting the theophylline since he has persistent symptoms. Will also continue Symbicort. -     Theophylline level; Future  Need for prophylactic vaccination against Streptococcus pneumoniae (pneumococcus) -     Pneumococcal polysaccharide vaccine 23-valent greater than or equal to 2yo subcutaneous/IM  Other orders -     Discontinue:  omeprazole (PRILOSEC) 20 MG capsule; Take 1 capsule (20 mg total) by mouth 2 (two) times daily before a meal.   I have discontinued Mr. Schliep HYDROcodone-acetaminophen, prochlorperazine, HYDROcodone-homatropine, carvedilol, omeprazole, and omeprazole. I am also having him start on omeprazole and carvedilol. Additionally, I am having him maintain his Polyethyl Glycol-Propyl Glycol (SYSTANE OP), valsartan, amLODipine, acetaminophen, lidocaine-prilocaine, FERRALET 90, atorvastatin, potassium chloride SA, budesonide-formoterol, and theophylline. We will stop administering sodium chloride flush, heparin lock flush, and sodium chloride flush.  Meds ordered this encounter  Medications  . DISCONTD: omeprazole (PRILOSEC) 20 MG capsule    Sig: Take 1 capsule (20 mg total) by mouth 2 (two) times daily before a meal.    Dispense:  30 capsule    Refill:  2  . omeprazole (PRILOSEC) 40 MG capsule    Sig: Take 1 capsule (40 mg total) by mouth daily.    Dispense:  90 capsule    Refill:  3  . carvedilol (COREG) 6.25 MG tablet    Sig: Take 1 tablet (6.25 mg total) by mouth 2 (two) times daily with a meal.    Dispense:  60 tablet    Refill:  5     Follow-up: Return in about 4 weeks (around 03/17/2016).  Scarlette Calico, MD

## 2016-02-18 NOTE — Progress Notes (Signed)
Pre visit review using our clinic review tool, if applicable. No additional management support is needed unless otherwise documented below in the visit note. 

## 2016-02-18 NOTE — Patient Instructions (Signed)
Hypertension Hypertension, commonly called high blood pressure, is when the force of blood pumping through your arteries is too strong. Your arteries are the blood vessels that carry blood from your heart throughout your body. A blood pressure reading consists of a higher number over a lower number, such as 110/72. The higher number (systolic) is the pressure inside your arteries when your heart pumps. The lower number (diastolic) is the pressure inside your arteries when your heart relaxes. Ideally you want your blood pressure below 120/80. Hypertension forces your heart to work harder to pump blood. Your arteries may become narrow or stiff. Having untreated or uncontrolled hypertension can cause heart attack, stroke, kidney disease, and other problems. RISK FACTORS Some risk factors for high blood pressure are controllable. Others are not.  Risk factors you cannot control include:   Race. You may be at higher risk if you are African American.  Age. Risk increases with age.  Gender. Men are at higher risk than women before age 45 years. After age 65, women are at higher risk than men. Risk factors you can control include:  Not getting enough exercise or physical activity.  Being overweight.  Getting too much fat, sugar, calories, or salt in your diet.  Drinking too much alcohol. SIGNS AND SYMPTOMS Hypertension does not usually cause signs or symptoms. Extremely high blood pressure (hypertensive crisis) may cause headache, anxiety, shortness of breath, and nosebleed. DIAGNOSIS To check if you have hypertension, your health care provider will measure your blood pressure while you are seated, with your arm held at the level of your heart. It should be measured at least twice using the same arm. Certain conditions can cause a difference in blood pressure between your right and left arms. A blood pressure reading that is higher than normal on one occasion does not mean that you need treatment. If  it is not clear whether you have high blood pressure, you may be asked to return on a different day to have your blood pressure checked again. Or, you may be asked to monitor your blood pressure at home for 1 or more weeks. TREATMENT Treating high blood pressure includes making lifestyle changes and possibly taking medicine. Living a healthy lifestyle can help lower high blood pressure. You may need to change some of your habits. Lifestyle changes may include:  Following the DASH diet. This diet is high in fruits, vegetables, and whole grains. It is low in salt, red meat, and added sugars.  Keep your sodium intake below 2,300 mg per day.  Getting at least 30-45 minutes of aerobic exercise at least 4 times per week.  Losing weight if necessary.  Not smoking.  Limiting alcoholic beverages.  Learning ways to reduce stress. Your health care provider may prescribe medicine if lifestyle changes are not enough to get your blood pressure under control, and if one of the following is true:  You are 18-59 years of age and your systolic blood pressure is above 140.  You are 60 years of age or older, and your systolic blood pressure is above 150.  Your diastolic blood pressure is above 90.  You have diabetes, and your systolic blood pressure is over 140 or your diastolic blood pressure is over 90.  You have kidney disease and your blood pressure is above 140/90.  You have heart disease and your blood pressure is above 140/90. Your personal target blood pressure may vary depending on your medical conditions, your age, and other factors. HOME CARE INSTRUCTIONS    Have your blood pressure rechecked as directed by your health care provider.   Take medicines only as directed by your health care provider. Follow the directions carefully. Blood pressure medicines must be taken as prescribed. The medicine does not work as well when you skip doses. Skipping doses also puts you at risk for  problems.  Do not smoke.   Monitor your blood pressure at home as directed by your health care provider. SEEK MEDICAL CARE IF:   You think you are having a reaction to medicines taken.  You have recurrent headaches or feel dizzy.  You have swelling in your ankles.  You have trouble with your vision. SEEK IMMEDIATE MEDICAL CARE IF:  You develop a severe headache or confusion.  You have unusual weakness, numbness, or feel faint.  You have severe chest or abdominal pain.  You vomit repeatedly.  You have trouble breathing. MAKE SURE YOU:   Understand these instructions.  Will watch your condition.  Will get help right away if you are not doing well or get worse.   This information is not intended to replace advice given to you by your health care provider. Make sure you discuss any questions you have with your health care provider.   Document Released: 04/21/2005 Document Revised: 09/05/2014 Document Reviewed: 02/11/2013 Elsevier Interactive Patient Education 2016 Elsevier Inc.  

## 2016-02-19 ENCOUNTER — Encounter: Payer: Self-pay | Admitting: Internal Medicine

## 2016-02-19 LAB — THEOPHYLLINE LEVEL

## 2016-02-28 ENCOUNTER — Other Ambulatory Visit: Payer: Commercial Managed Care - HMO

## 2016-02-28 ENCOUNTER — Telehealth: Payer: Self-pay | Admitting: Oncology

## 2016-02-28 ENCOUNTER — Ambulatory Visit (HOSPITAL_BASED_OUTPATIENT_CLINIC_OR_DEPARTMENT_OTHER): Payer: Commercial Managed Care - HMO

## 2016-02-28 ENCOUNTER — Ambulatory Visit (HOSPITAL_BASED_OUTPATIENT_CLINIC_OR_DEPARTMENT_OTHER): Payer: Commercial Managed Care - HMO | Admitting: Nurse Practitioner

## 2016-02-28 ENCOUNTER — Other Ambulatory Visit (HOSPITAL_BASED_OUTPATIENT_CLINIC_OR_DEPARTMENT_OTHER): Payer: Commercial Managed Care - HMO

## 2016-02-28 ENCOUNTER — Ambulatory Visit: Payer: Commercial Managed Care - HMO | Admitting: Oncology

## 2016-02-28 VITALS — BP 146/62 | HR 78 | Temp 98.2°F | Resp 18 | Ht 74.0 in | Wt 237.7 lb

## 2016-02-28 DIAGNOSIS — I10 Essential (primary) hypertension: Secondary | ICD-10-CM

## 2016-02-28 DIAGNOSIS — D701 Agranulocytosis secondary to cancer chemotherapy: Secondary | ICD-10-CM | POA: Diagnosis not present

## 2016-02-28 DIAGNOSIS — D509 Iron deficiency anemia, unspecified: Secondary | ICD-10-CM

## 2016-02-28 DIAGNOSIS — Z452 Encounter for adjustment and management of vascular access device: Secondary | ICD-10-CM

## 2016-02-28 DIAGNOSIS — C162 Malignant neoplasm of body of stomach: Secondary | ICD-10-CM

## 2016-02-28 DIAGNOSIS — Z8546 Personal history of malignant neoplasm of prostate: Secondary | ICD-10-CM

## 2016-02-28 DIAGNOSIS — C7972 Secondary malignant neoplasm of left adrenal gland: Secondary | ICD-10-CM

## 2016-02-28 DIAGNOSIS — C169 Malignant neoplasm of stomach, unspecified: Secondary | ICD-10-CM

## 2016-02-28 DIAGNOSIS — G620 Drug-induced polyneuropathy: Secondary | ICD-10-CM

## 2016-02-28 DIAGNOSIS — Z95828 Presence of other vascular implants and grafts: Secondary | ICD-10-CM

## 2016-02-28 LAB — CBC WITH DIFFERENTIAL/PLATELET
BASO%: 0.2 % (ref 0.0–2.0)
Basophils Absolute: 0 10*3/uL (ref 0.0–0.1)
EOS%: 1.5 % (ref 0.0–7.0)
Eosinophils Absolute: 0.1 10*3/uL (ref 0.0–0.5)
HEMATOCRIT: 39.5 % (ref 38.4–49.9)
HEMOGLOBIN: 13.5 g/dL (ref 13.0–17.1)
LYMPH#: 1.3 10*3/uL (ref 0.9–3.3)
LYMPH%: 28.5 % (ref 14.0–49.0)
MCH: 29 pg (ref 27.2–33.4)
MCHC: 34.2 g/dL (ref 32.0–36.0)
MCV: 84.8 fL (ref 79.3–98.0)
MONO#: 0.5 10*3/uL (ref 0.1–0.9)
MONO%: 10 % (ref 0.0–14.0)
NEUT%: 59.8 % (ref 39.0–75.0)
NEUTROS ABS: 2.8 10*3/uL (ref 1.5–6.5)
PLATELETS: 164 10*3/uL (ref 140–400)
RBC: 4.66 10*6/uL (ref 4.20–5.82)
RDW: 13.7 % (ref 11.0–14.6)
WBC: 4.7 10*3/uL (ref 4.0–10.3)

## 2016-02-28 MED ORDER — HEPARIN SOD (PORK) LOCK FLUSH 100 UNIT/ML IV SOLN
500.0000 [IU] | Freq: Once | INTRAVENOUS | Status: AC | PRN
  Administered 2016-02-28: 500 [IU] via INTRAVENOUS
  Filled 2016-02-28: qty 5

## 2016-02-28 MED ORDER — SODIUM CHLORIDE 0.9 % IJ SOLN
10.0000 mL | INTRAMUSCULAR | Status: DC | PRN
  Administered 2016-02-28: 10 mL via INTRAVENOUS
  Filled 2016-02-28: qty 10

## 2016-02-28 NOTE — Telephone Encounter (Signed)
Gave patient avs report and appointments for December  °

## 2016-02-28 NOTE — Progress Notes (Signed)
  [place] OFFICE PROGRESS NOTE   Diagnosis:  Gastric cancer  INTERVAL HISTORY:   [name] returns as scheduled. He continues to have neuropathy symptoms in the hands and feet. There is no associated pain. He denies nausea/vomiting. No dysphagia. No pain. He has stable dyspnea on exertion. He has a good appetite. He is gaining weight.  Objective:  Vital signs in last 24 hours:  Blood pressure (!) 146/62, pulse 78, temperature 98.2 F (36.8 C), temperature source Oral, resp. rate 18, height '6\' 2"'$  (1.88 m), weight 237 lb 11.2 oz (107.8 kg), SpO2 96 %.    HEENT: No thrush or ulcers. Lymphatics: No palpable cervical, supraclavicular or axillary lymph nodes. Resp: Lungs clear bilaterally. Cardio: Regular rate and rhythm. GI: Abdomen soft and nontender. No hepatomegaly. Vascular: No leg edema. Port-A-Cath without erythema.  Lab Results:  Lab Results  Component Value Date   WBC 4.7 02/28/2016   HGB 13.5 02/28/2016   HCT 39.5 02/28/2016   MCV 84.8 02/28/2016   PLT 164 02/28/2016   NEUTROABS 2.8 02/28/2016    Imaging:  No results found.  Medications: I have reviewed the patient's current medications.  Assessment/Plan: 1. Gastric Cancer ? Lesser curvature gastric body mass noted on endoscopy 06/27/2015, biopsy confirmed adenocarcinoma ? Staging CT scans to 06/28/2015 with a gastric body mass, adenopathy adjacent to the stomach, chest adenopathy, a single enhancing liver lesion, and indeterminate nodular pulmonary lesions ? PET scan 07/12/2015-hypermetabolic gastric mass, gastrohepatic ligament metastases, metastasis at the left adrenal gland, hypermetabolic right paratracheal, right hilar, and subcarinal nodes moderate metabolic activity involving 2 pulmonary lesions ? Cycle 1 FOLFOX 07/19/2015 ? Cycle 2 FOLFOX 08/02/2015 ? Cycle 3 FOLFOX 08/23/2015 with Neulasta support ? Cycle 4 FOLFOX 09/06/2015 with Neulasta support ? Cycle 5 FOLFOX 09/20/2015 with  Neulasta support ? Restaging CTs 10/02/2015-decrease in chest and perigastric lymphadenopathy, decrease thickening at the distal stomach, 11 mm sclerotic lesion in the posterior left third rib-barely visible on the PET/CT 07/12/2015 ? Cycle 6 FOLFOX 10/04/2015 ? Cycle 7 FOLFOX 10/18/2015 ? Cycle 8 FOLFOX 11/08/2015 (oxaliplatin held due to neuropathy) ? Cycle 9 FOLFOX 11/22/2015 (oxaliplatin held secondary to neuropathy) ? Cycle 10 FOLFOX 12/06/2015 (oxaliplatin held secondary to neuropathy) ? CTs 12/24/2015-stable lung nodules and mediastinal lymph nodes, no evidence of disease progression  2. COPD  3. Nonischemic cardiomyopathy  4. Implanted cardiac defibrillator  5. History of prostate cancer  6. Hypertension  7. Iron deficiency anemia  8. Neutropenia secondary to chemotherapy-Neulasta added with cycle 3 FOLFOX 08/23/2015  9. 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 appears stable. There is no clinical evidence of disease progression. We will continue to follow with observation. He will return for a follow-up visit and Port-A-Cath flush in 8 weeks. He will contact the office in the interim with any problems.    Ned Card ANP/GNP-BC   02/28/2016  3:51 PM

## 2016-03-10 DIAGNOSIS — E119 Type 2 diabetes mellitus without complications: Secondary | ICD-10-CM | POA: Diagnosis not present

## 2016-03-10 DIAGNOSIS — Z961 Presence of intraocular lens: Secondary | ICD-10-CM | POA: Diagnosis not present

## 2016-03-10 DIAGNOSIS — H524 Presbyopia: Secondary | ICD-10-CM | POA: Diagnosis not present

## 2016-03-10 LAB — HM DIABETES EYE EXAM

## 2016-03-12 ENCOUNTER — Encounter: Payer: Self-pay | Admitting: Internal Medicine

## 2016-03-28 ENCOUNTER — Other Ambulatory Visit: Payer: Self-pay | Admitting: Internal Medicine

## 2016-04-15 ENCOUNTER — Ambulatory Visit (INDEPENDENT_AMBULATORY_CARE_PROVIDER_SITE_OTHER): Payer: Commercial Managed Care - HMO | Admitting: *Deleted

## 2016-04-15 DIAGNOSIS — I428 Other cardiomyopathies: Secondary | ICD-10-CM | POA: Diagnosis not present

## 2016-04-15 DIAGNOSIS — I5022 Chronic systolic (congestive) heart failure: Secondary | ICD-10-CM

## 2016-04-15 NOTE — Progress Notes (Signed)
Remote ICD transmission.   

## 2016-04-23 ENCOUNTER — Encounter: Payer: Self-pay | Admitting: Cardiology

## 2016-04-29 ENCOUNTER — Other Ambulatory Visit (HOSPITAL_BASED_OUTPATIENT_CLINIC_OR_DEPARTMENT_OTHER): Payer: Commercial Managed Care - HMO

## 2016-04-29 ENCOUNTER — Telehealth: Payer: Self-pay | Admitting: Oncology

## 2016-04-29 ENCOUNTER — Ambulatory Visit (HOSPITAL_BASED_OUTPATIENT_CLINIC_OR_DEPARTMENT_OTHER): Payer: Commercial Managed Care - HMO | Admitting: Oncology

## 2016-04-29 ENCOUNTER — Ambulatory Visit (HOSPITAL_BASED_OUTPATIENT_CLINIC_OR_DEPARTMENT_OTHER): Payer: Commercial Managed Care - HMO

## 2016-04-29 DIAGNOSIS — G620 Drug-induced polyneuropathy: Secondary | ICD-10-CM | POA: Diagnosis not present

## 2016-04-29 DIAGNOSIS — Z452 Encounter for adjustment and management of vascular access device: Secondary | ICD-10-CM

## 2016-04-29 DIAGNOSIS — I10 Essential (primary) hypertension: Secondary | ICD-10-CM | POA: Diagnosis not present

## 2016-04-29 DIAGNOSIS — C162 Malignant neoplasm of body of stomach: Secondary | ICD-10-CM | POA: Diagnosis not present

## 2016-04-29 DIAGNOSIS — C7972 Secondary malignant neoplasm of left adrenal gland: Secondary | ICD-10-CM

## 2016-04-29 DIAGNOSIS — Z95828 Presence of other vascular implants and grafts: Secondary | ICD-10-CM

## 2016-04-29 DIAGNOSIS — C169 Malignant neoplasm of stomach, unspecified: Secondary | ICD-10-CM

## 2016-04-29 LAB — CBC WITH DIFFERENTIAL/PLATELET
BASO%: 0.2 % (ref 0.0–2.0)
BASOS ABS: 0 10*3/uL (ref 0.0–0.1)
EOS%: 1.7 % (ref 0.0–7.0)
Eosinophils Absolute: 0.1 10*3/uL (ref 0.0–0.5)
HCT: 39.6 % (ref 38.4–49.9)
HEMOGLOBIN: 13.1 g/dL (ref 13.0–17.1)
LYMPH%: 24 % (ref 14.0–49.0)
MCH: 27.8 pg (ref 27.2–33.4)
MCHC: 33.1 g/dL (ref 32.0–36.0)
MCV: 83.9 fL (ref 79.3–98.0)
MONO#: 0.4 10*3/uL (ref 0.1–0.9)
MONO%: 8.3 % (ref 0.0–14.0)
NEUT#: 3.4 10*3/uL (ref 1.5–6.5)
NEUT%: 65.8 % (ref 39.0–75.0)
Platelets: 155 10*3/uL (ref 140–400)
RBC: 4.72 10*6/uL (ref 4.20–5.82)
RDW: 14.4 % (ref 11.0–14.6)
WBC: 5.2 10*3/uL (ref 4.0–10.3)
lymph#: 1.2 10*3/uL (ref 0.9–3.3)

## 2016-04-29 MED ORDER — SODIUM CHLORIDE 0.9 % IJ SOLN
10.0000 mL | INTRAMUSCULAR | Status: DC | PRN
  Administered 2016-04-29: 10 mL via INTRAVENOUS
  Filled 2016-04-29: qty 10

## 2016-04-29 MED ORDER — HEPARIN SOD (PORK) LOCK FLUSH 100 UNIT/ML IV SOLN
500.0000 [IU] | Freq: Once | INTRAVENOUS | Status: AC | PRN
  Administered 2016-04-29: 500 [IU] via INTRAVENOUS
  Filled 2016-04-29: qty 5

## 2016-04-29 NOTE — Telephone Encounter (Signed)
Appointments scheduled for 6 weeks, per 04/29/16 los. Patient was given a copy of the appointment schedule and AVS report, per 04/29/16 los.

## 2016-04-29 NOTE — Progress Notes (Signed)
  [facility] OFFICE PROGRESS NOTE   Diagnosis: Gastric cancer  INTERVAL HISTORY:   [name] returns as scheduled. He feels well. No nausea. No dysphagia. No bleeding. He continues iron. He reports chronic exertional dyspnea. He continues to have numbness in the fingers and feet/lower legs. He has difficulty buttoning his shirt.   Objective:  Vital signs in last 24 hours:  Blood pressure (!) (P) 150/68, pulse (P) 69, temperature (P) 97.7 F (36.5 C), temperature source (P) Oral, resp. rate (P) 18, height (P) '6\' 2"'$  (1.88 m), weight (P) 240 lb 11.2 oz (109.2 kg), SpO2 (P) 95 %.    HEENT: Neck without mass Lymphatics: No cervical, supra-clavicular, axillary, or inguinal nodes Resp: Scattered end expiratory wheeze, no respiratory distress Cardio: Regular rate and rhythm GI: No hepatomegaly, no mass, nontender Vascular: No leg edema  Portacath/PICC-without erythema  Lab Results:  Lab Results  Component Value Date   WBC 5.2 04/29/2016   HGB 13.1 04/29/2016   HCT 39.6 04/29/2016   MCV 83.9 04/29/2016   PLT 155 04/29/2016   NEUTROABS 3.4 04/29/2016     Medications: I have reviewed the patient's current medications.  Assessment/Plan: 1. Gastric Cancer ? Lesser curvature gastric body mass noted on endoscopy 06/27/2015, biopsy confirmed adenocarcinoma ? Staging CT scans to 06/28/2015 with a gastric body mass, adenopathy adjacent to the stomach, chest adenopathy, a single enhancing liver lesion, and indeterminate nodular pulmonary lesions ? PET scan 07/12/2015-hypermetabolic gastric mass, gastrohepatic ligament metastases, metastasis at the left adrenal gland, hypermetabolic right paratracheal, right hilar, and subcarinal nodes moderate metabolic activity involving 2 pulmonary lesions ? Cycle 1 FOLFOX 07/19/2015 ? Cycle 2 FOLFOX 08/02/2015 ? Cycle 3 FOLFOX 08/23/2015 with Neulasta support ? Cycle 4 FOLFOX 09/06/2015 with Neulasta support ? Cycle 5 FOLFOX  09/20/2015 with Neulasta support ? Restaging CTs 10/02/2015-decrease in chest and perigastric lymphadenopathy, decrease thickening at the distal stomach, 11 mm sclerotic lesion in the posterior left third rib-barely visible on the PET/CT 07/12/2015 ? Cycle 6 FOLFOX 10/04/2015 ? Cycle 7 FOLFOX 10/18/2015 ? Cycle 8 FOLFOX 11/08/2015 (oxaliplatin held due to neuropathy) ? Cycle 9 FOLFOX 11/22/2015 (oxaliplatin held secondary to neuropathy) ? Cycle 10 FOLFOX 12/06/2015 (oxaliplatin held secondary to neuropathy) ? CTs 12/24/2015-stable lung nodules and mediastinal lymph nodes, no evidence of disease progression  2. COPD  3. Nonischemic cardiomyopathy  4. Implanted cardiac defibrillator  5. History of prostate cancer  6. Hypertension  7. History of Iron deficiency anemia  8. Neutropenia secondary to chemotherapy-Neulasta added with cycle 3 FOLFOX 08/23/2015  9. Oxaliplatin neuropathy  10. Left ear hearing loss-referred to ENT   Disposition:  [name] appears stable. No evidence of disease progression. He will return for an office visit and Port-A-Cath flush in 6 weeks.  Betsy Coder, MD  04/29/2016  11:54 AM

## 2016-05-08 LAB — CUP PACEART REMOTE DEVICE CHECK
Brady Statistic AP VP Percent: 47 %
Brady Statistic AP VS Percent: 2.5 %
Brady Statistic AS VP Percent: 44 %
Brady Statistic AS VS Percent: 3.8 %
Date Time Interrogation Session: 20171212070017
HIGH POWER IMPEDANCE MEASURED VALUE: 79 Ohm
HighPow Impedance: 79 Ohm
Implantable Lead Implant Date: 20160314
Implantable Lead Implant Date: 20160314
Implantable Lead Implant Date: 20160314
Implantable Lead Location: 753859
Lead Channel Impedance Value: 430 Ohm
Lead Channel Pacing Threshold Amplitude: 1 V
Lead Channel Pacing Threshold Pulse Width: 0.5 ms
Lead Channel Pacing Threshold Pulse Width: 0.7 ms
Lead Channel Sensing Intrinsic Amplitude: 5 mV
Lead Channel Setting Pacing Amplitude: 2 V
Lead Channel Setting Pacing Amplitude: 2 V
Lead Channel Setting Pacing Pulse Width: 0.7 ms
MDC IDC LEAD LOCATION: 753858
MDC IDC LEAD LOCATION: 753860
MDC IDC LEAD MODEL: 7122
MDC IDC MSMT BATTERY REMAINING LONGEVITY: 55 mo
MDC IDC MSMT BATTERY REMAINING PERCENTAGE: 69 %
MDC IDC MSMT BATTERY VOLTAGE: 2.99 V
MDC IDC MSMT LEADCHNL LV IMPEDANCE VALUE: 480 Ohm
MDC IDC MSMT LEADCHNL LV PACING THRESHOLD AMPLITUDE: 0.875 V
MDC IDC MSMT LEADCHNL RA PACING THRESHOLD PULSEWIDTH: 0.5 ms
MDC IDC MSMT LEADCHNL RV IMPEDANCE VALUE: 490 Ohm
MDC IDC MSMT LEADCHNL RV PACING THRESHOLD AMPLITUDE: 0.375 V
MDC IDC MSMT LEADCHNL RV SENSING INTR AMPL: 12 mV
MDC IDC PG IMPLANT DT: 20160314
MDC IDC SET LEADCHNL LV PACING AMPLITUDE: 2 V
MDC IDC SET LEADCHNL RV PACING PULSEWIDTH: 0.5 ms
MDC IDC SET LEADCHNL RV SENSING SENSITIVITY: 0.5 mV
MDC IDC STAT BRADY RA PERCENT PACED: 46 %
Pulse Gen Serial Number: 7239374

## 2016-05-14 ENCOUNTER — Telehealth: Payer: Self-pay | Admitting: Internal Medicine

## 2016-05-14 ENCOUNTER — Other Ambulatory Visit: Payer: Self-pay | Admitting: Internal Medicine

## 2016-05-14 NOTE — Telephone Encounter (Signed)
Spoke with pt's wife, requesting refill on theophylline.  This has been sent to preferred pharmacy.  Nothing further needed.

## 2016-05-25 ENCOUNTER — Other Ambulatory Visit: Payer: Self-pay | Admitting: Internal Medicine

## 2016-05-30 ENCOUNTER — Other Ambulatory Visit: Payer: Self-pay | Admitting: Internal Medicine

## 2016-06-10 ENCOUNTER — Telehealth: Payer: Self-pay | Admitting: Oncology

## 2016-06-10 ENCOUNTER — Ambulatory Visit (HOSPITAL_BASED_OUTPATIENT_CLINIC_OR_DEPARTMENT_OTHER): Payer: Medicare HMO | Admitting: Nurse Practitioner

## 2016-06-10 ENCOUNTER — Ambulatory Visit (HOSPITAL_BASED_OUTPATIENT_CLINIC_OR_DEPARTMENT_OTHER): Payer: Medicare HMO

## 2016-06-10 ENCOUNTER — Other Ambulatory Visit (HOSPITAL_BASED_OUTPATIENT_CLINIC_OR_DEPARTMENT_OTHER): Payer: Medicare HMO

## 2016-06-10 VITALS — BP 138/84 | HR 74 | Temp 98.2°F | Resp 18 | Ht 74.0 in | Wt 239.8 lb

## 2016-06-10 DIAGNOSIS — D701 Agranulocytosis secondary to cancer chemotherapy: Secondary | ICD-10-CM | POA: Diagnosis not present

## 2016-06-10 DIAGNOSIS — C162 Malignant neoplasm of body of stomach: Secondary | ICD-10-CM

## 2016-06-10 DIAGNOSIS — C169 Malignant neoplasm of stomach, unspecified: Secondary | ICD-10-CM

## 2016-06-10 DIAGNOSIS — Z452 Encounter for adjustment and management of vascular access device: Secondary | ICD-10-CM

## 2016-06-10 DIAGNOSIS — C7972 Secondary malignant neoplasm of left adrenal gland: Secondary | ICD-10-CM | POA: Diagnosis not present

## 2016-06-10 DIAGNOSIS — Z95828 Presence of other vascular implants and grafts: Secondary | ICD-10-CM

## 2016-06-10 DIAGNOSIS — G620 Drug-induced polyneuropathy: Secondary | ICD-10-CM

## 2016-06-10 DIAGNOSIS — I10 Essential (primary) hypertension: Secondary | ICD-10-CM

## 2016-06-10 LAB — CBC WITH DIFFERENTIAL/PLATELET
BASO%: 0.8 % (ref 0.0–2.0)
Basophils Absolute: 0 10*3/uL (ref 0.0–0.1)
EOS ABS: 0.2 10*3/uL (ref 0.0–0.5)
EOS%: 3.4 % (ref 0.0–7.0)
HEMATOCRIT: 39.3 % (ref 38.4–49.9)
HGB: 13.1 g/dL (ref 13.0–17.1)
LYMPH#: 1 10*3/uL (ref 0.9–3.3)
LYMPH%: 18 % (ref 14.0–49.0)
MCH: 27.8 pg (ref 27.2–33.4)
MCHC: 33.4 g/dL (ref 32.0–36.0)
MCV: 83.1 fL (ref 79.3–98.0)
MONO#: 0.7 10*3/uL (ref 0.1–0.9)
MONO%: 12 % (ref 0.0–14.0)
NEUT%: 65.8 % (ref 39.0–75.0)
NEUTROS ABS: 3.6 10*3/uL (ref 1.5–6.5)
PLATELETS: 157 10*3/uL (ref 140–400)
RBC: 4.73 10*6/uL (ref 4.20–5.82)
RDW: 15.8 % — ABNORMAL HIGH (ref 11.0–14.6)
WBC: 5.5 10*3/uL (ref 4.0–10.3)

## 2016-06-10 MED ORDER — HEPARIN SOD (PORK) LOCK FLUSH 100 UNIT/ML IV SOLN
500.0000 [IU] | Freq: Once | INTRAVENOUS | Status: AC | PRN
  Administered 2016-06-10: 500 [IU] via INTRAVENOUS
  Filled 2016-06-10: qty 5

## 2016-06-10 MED ORDER — POTASSIUM CHLORIDE CRYS ER 20 MEQ PO TBCR: 20.0000 meq | EXTENDED_RELEASE_TABLET | Freq: Every day | ORAL | 1 refills | Status: DC

## 2016-06-10 MED ORDER — SODIUM CHLORIDE 0.9 % IJ SOLN
10.0000 mL | INTRAMUSCULAR | Status: DC | PRN
  Administered 2016-06-10: 10 mL via INTRAVENOUS
  Filled 2016-06-10: qty 10

## 2016-06-10 NOTE — Telephone Encounter (Signed)
Appointments scheduled per 06/10/16 los. Patient was given a copy of the AVS report and appointment schedule,per 06/10/16 los.

## 2016-06-10 NOTE — Progress Notes (Signed)
  [facility] OFFICE PROGRESS NOTE   Diagnosis:  Gastric cancer  INTERVAL HISTORY:   Mr. Ballin returns as scheduled. He reports a good appetite. He denies dysphagia. No nausea or vomiting. No pain. He denies bleeding. He continues iron. He has persistent numbness in the fingertips and feet. He has difficulty buttoning his shirt.  Objective:  Vital signs in last 24 hours:  Blood pressure 138/84, pulse 74, temperature 98.2 F (36.8 C), temperature source Oral, resp. rate 18, height '6\' 2"'$  (1.88 m), weight 239 lb 12.8 oz (108.8 kg), SpO2 93 %.    HEENT: No thrush or ulcers. No neck mass. Lymphatics: No palpable cervical, supraclavicular or axillary lymph nodes. Resp: Lungs clear bilaterally. Cardio: Regular rate and rhythm. GI: Abdomen soft and nontender. No hepatomegaly. No mass. Vascular: No leg edema. Port-A-Cath without erythema.  Lab Results:  Lab Results  Component Value Date   WBC 5.5 06/10/2016   HGB 13.1 06/10/2016   HCT 39.3 06/10/2016   MCV 83.1 06/10/2016   PLT 157 06/10/2016   NEUTROABS 3.6 06/10/2016    Imaging:  No results found.  Medications: I have reviewed the patient's current medications.  Assessment/Plan: 1. Gastric Cancer ? Lesser curvature gastric body mass noted on endoscopy 06/27/2015, biopsy confirmed adenocarcinoma ? Staging CT scans to 06/28/2015 with a gastric body mass, adenopathy adjacent to the stomach, chest adenopathy, a single enhancing liver lesion, and indeterminate nodular pulmonary lesions ? PET scan 07/12/2015-hypermetabolic gastric mass, gastrohepatic ligament metastases, metastasis at the left adrenal gland, hypermetabolic right paratracheal, right hilar, and subcarinal nodes moderate metabolic activity involving 2 pulmonary lesions ? Cycle 1 FOLFOX 07/19/2015 ? Cycle 2 FOLFOX 08/02/2015 ? Cycle 3 FOLFOX 08/23/2015 with Neulasta support ? Cycle 4 FOLFOX 09/06/2015 with Neulasta support ? Cycle 5 FOLFOX  09/20/2015 with Neulasta support ? Restaging CTs 10/02/2015-decrease in chest and perigastric lymphadenopathy, decrease thickening at the distal stomach, 11 mm sclerotic lesion in the posterior left third rib-barely visible on the PET/CT 07/12/2015 ? Cycle 6 FOLFOX 10/04/2015 ? Cycle 7 FOLFOX 10/18/2015 ? Cycle 8 FOLFOX 11/08/2015 (oxaliplatin held due to neuropathy) ? Cycle 9 FOLFOX 11/22/2015 (oxaliplatin held secondary to neuropathy) ? Cycle 10 FOLFOX 12/06/2015 (oxaliplatin held secondary to neuropathy) ? CTs 12/24/2015-stable lung nodules and mediastinal lymph nodes, no evidence of disease progression  2. COPD  3. Nonischemic cardiomyopathy  4. Implanted cardiac defibrillator  5. History of prostate cancer  6. Hypertension  7. History of Iron deficiency anemia  8. Neutropenia secondary to chemotherapy-Neulasta added with cycle 3 FOLFOX 08/23/2015  9. 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 Perkins appears stable. There is no evidence of disease progression. He will return for labs, port flush and a follow-up visit in 6 weeks. He understands to contact the office in the interim with any problems.  Plan reviewed with Dr. [name].    Ned Card ANP/GNP-BC   06/10/2016  10:01 AM

## 2016-07-04 ENCOUNTER — Telehealth: Payer: Self-pay | Admitting: Internal Medicine

## 2016-07-04 MED ORDER — HYDROCODONE-HOMATROPINE 5-1.5 MG/5ML PO SYRP: 5.0000 mL | ORAL_SOLUTION | Freq: Four times a day (QID) | ORAL | 0 refills | Status: DC | PRN

## 2016-07-04 NOTE — Telephone Encounter (Signed)
Spoke with pt's wife. Pt is requesting a refill on Hycodan. This was last given to him on 08/10/15 #[id number]. His last OV was on 02/11/16 by CY.  CY - please advise on refill. Thanks.

## 2016-07-04 NOTE — Telephone Encounter (Signed)
Rx has been printed, signed and placed up front. Spoke with pt's wife. She is aware that rx is ready. Nothing further was needed.

## 2016-07-04 NOTE — Telephone Encounter (Signed)
Ok to refill 

## 2016-07-07 ENCOUNTER — Other Ambulatory Visit: Payer: Self-pay | Admitting: Internal Medicine

## 2016-07-07 DIAGNOSIS — I10 Essential (primary) hypertension: Secondary | ICD-10-CM

## 2016-07-07 DIAGNOSIS — I5022 Chronic systolic (congestive) heart failure: Secondary | ICD-10-CM

## 2016-07-07 DIAGNOSIS — Z794 Long term (current) use of insulin: Secondary | ICD-10-CM

## 2016-07-07 DIAGNOSIS — E118 Type 2 diabetes mellitus with unspecified complications: Secondary | ICD-10-CM

## 2016-07-16 ENCOUNTER — Other Ambulatory Visit: Payer: Self-pay | Admitting: Internal Medicine

## 2016-07-22 ENCOUNTER — Telehealth: Payer: Self-pay | Admitting: Oncology

## 2016-07-22 ENCOUNTER — Ambulatory Visit (HOSPITAL_BASED_OUTPATIENT_CLINIC_OR_DEPARTMENT_OTHER): Payer: Medicare HMO

## 2016-07-22 ENCOUNTER — Other Ambulatory Visit (HOSPITAL_BASED_OUTPATIENT_CLINIC_OR_DEPARTMENT_OTHER): Payer: Medicare HMO

## 2016-07-22 ENCOUNTER — Ambulatory Visit (HOSPITAL_BASED_OUTPATIENT_CLINIC_OR_DEPARTMENT_OTHER): Payer: Medicare HMO | Admitting: Oncology

## 2016-07-22 ENCOUNTER — Other Ambulatory Visit: Payer: Self-pay | Admitting: *Deleted

## 2016-07-22 VITALS — BP 130/88 | HR 83 | Temp 97.4°F | Resp 18 | Ht 74.0 in | Wt 233.4 lb

## 2016-07-22 DIAGNOSIS — C7972 Secondary malignant neoplasm of left adrenal gland: Secondary | ICD-10-CM

## 2016-07-22 DIAGNOSIS — C162 Malignant neoplasm of body of stomach: Secondary | ICD-10-CM

## 2016-07-22 DIAGNOSIS — C169 Malignant neoplasm of stomach, unspecified: Secondary | ICD-10-CM

## 2016-07-22 DIAGNOSIS — Z452 Encounter for adjustment and management of vascular access device: Secondary | ICD-10-CM

## 2016-07-22 DIAGNOSIS — J449 Chronic obstructive pulmonary disease, unspecified: Secondary | ICD-10-CM | POA: Diagnosis not present

## 2016-07-22 DIAGNOSIS — Z95828 Presence of other vascular implants and grafts: Secondary | ICD-10-CM

## 2016-07-22 DIAGNOSIS — D509 Iron deficiency anemia, unspecified: Secondary | ICD-10-CM

## 2016-07-22 DIAGNOSIS — Z8546 Personal history of malignant neoplasm of prostate: Secondary | ICD-10-CM

## 2016-07-22 DIAGNOSIS — G620 Drug-induced polyneuropathy: Secondary | ICD-10-CM

## 2016-07-22 LAB — CBC WITH DIFFERENTIAL/PLATELET
BASO%: 0.6 % (ref 0.0–2.0)
BASOS ABS: 0 10*3/uL (ref 0.0–0.1)
EOS ABS: 0.1 10*3/uL (ref 0.0–0.5)
EOS%: 1 % (ref 0.0–7.0)
HEMATOCRIT: 30.8 % — AB (ref 38.4–49.9)
HEMOGLOBIN: 10.1 g/dL — AB (ref 13.0–17.1)
LYMPH%: 11.9 % — ABNORMAL LOW (ref 14.0–49.0)
MCH: 27 pg — AB (ref 27.2–33.4)
MCHC: 32.7 g/dL (ref 32.0–36.0)
MCV: 82.6 fL (ref 79.3–98.0)
MONO#: 0.6 10*3/uL (ref 0.1–0.9)
MONO%: 7.6 % (ref 0.0–14.0)
NEUT#: 5.8 10*3/uL (ref 1.5–6.5)
NEUT%: 78.9 % — ABNORMAL HIGH (ref 39.0–75.0)
Platelets: 227 10*3/uL (ref 140–400)
RBC: 3.73 10*6/uL — ABNORMAL LOW (ref 4.20–5.82)
RDW: 17.6 % — AB (ref 11.0–14.6)
WBC: 7.3 10*3/uL (ref 4.0–10.3)
lymph#: 0.9 10*3/uL (ref 0.9–3.3)

## 2016-07-22 LAB — BASIC METABOLIC PANEL
Anion Gap: 8 mEq/L (ref 3–11)
BUN: 14.2 mg/dL (ref 7.0–26.0)
CALCIUM: 8.9 mg/dL (ref 8.4–10.4)
CO2: 23 meq/L (ref 22–29)
CREATININE: 1.1 mg/dL (ref 0.7–1.3)
Chloride: 107 mEq/L (ref 98–109)
EGFR: 74 mL/min/{1.73_m2} — ABNORMAL LOW (ref >90)
Glucose: 110 mg/dl (ref 70–140)
Potassium: 4.3 mEq/L (ref 3.5–5.1)
Sodium: 138 mEq/L (ref 136–145)

## 2016-07-22 MED ORDER — SODIUM CHLORIDE 0.9 % IJ SOLN
10.0000 mL | INTRAMUSCULAR | Status: DC | PRN
  Administered 2016-07-22: 10 mL via INTRAVENOUS
  Filled 2016-07-22: qty 10

## 2016-07-22 MED ORDER — HEPARIN SOD (PORK) LOCK FLUSH 100 UNIT/ML IV SOLN
500.0000 [IU] | Freq: Once | INTRAVENOUS | Status: AC | PRN
  Administered 2016-07-22: 500 [IU] via INTRAVENOUS
  Filled 2016-07-22: qty 5

## 2016-07-22 NOTE — Telephone Encounter (Signed)
Appointments scheduled per 07/22/16 los. Patient was given a copy of the AVS report and appointments schedule, per 07/22/16 los.

## 2016-07-22 NOTE — Progress Notes (Signed)
  [facility] OFFICE PROGRESS NOTE   Diagnosis: Gastric cancer  INTERVAL HISTORY:   Mr. Gildner returns as scheduled. He reports feeling well. There has been no improvement in the neuropathy symptoms at the hands or feet. He is caring for his wife has dementia. He reports intentional weight loss with a change in his diet. No bleeding. He is taking iron.  Objective:  Vital signs in last 24 hours:  Blood pressure 130/88, pulse 83, temperature 97.4 F (36.3 C), temperature source Oral, resp. rate 18, height '6\' 2"'$  (1.88 m), weight 233 lb 6.4 oz (105.9 kg), SpO2 99 %.    HEENT: Neck without mass Lymphatics: No cervical, supraclavicular, or axillary nodes Resp: Lungs clear bilaterally Cardio: Regular rate and rhythm GI: No hepatosplenomegaly, no mass, nontender Vascular: No leg edema  Portacath/PICC-without erythema  Lab Results:  Lab Results  Component Value Date   WBC 7.3 07/22/2016   HGB 10.1 (L) 07/22/2016   HCT 30.8 (L) 07/22/2016   MCV 82.6 07/22/2016   PLT 227 07/22/2016   NEUTROABS 5.8 07/22/2016    Medications: I have reviewed the patient's current medications.  Assessment/Plan: 1. Gastric Cancer  Lesser curvature gastric body mass noted on endoscopy 06/27/2015, biopsy confirmed adenocarcinoma  Staging CT scans to 06/28/2015 with a gastric body mass, adenopathy adjacent to the stomach, chest adenopathy, a single enhancing liver lesion, and indeterminate nodular pulmonary lesions  PET scan 07/12/2015-hypermetabolic gastric mass, gastrohepatic ligament metastases, metastasis at the left adrenal gland, hypermetabolic right paratracheal, right hilar, and subcarinal nodes moderate metabolic activity involving 2 pulmonary lesions  Cycle 1 FOLFOX 07/19/2015  Cycle 2 FOLFOX 08/02/2015  Cycle 3 FOLFOX 08/23/2015 with Neulasta support  Cycle 4 FOLFOX 09/06/2015 with Neulasta support  Cycle 5 FOLFOX 09/20/2015 with Neulasta support  Restaging CTs  10/02/2015-decrease in chest and perigastric lymphadenopathy, decrease thickening at the distal stomach, 11 mm sclerotic lesion in the posterior left third rib-barely visible on the PET/CT 07/12/2015  Cycle 6 FOLFOX 10/04/2015  Cycle 7 FOLFOX 10/18/2015  Cycle 8 FOLFOX 11/08/2015 (oxaliplatin held due to neuropathy)  Cycle 9 FOLFOX 11/22/2015 (oxaliplatin held secondary to neuropathy)  Cycle 10 FOLFOX 12/06/2015 (oxaliplatin held secondary to neuropathy)  CTs 12/24/2015-stable lung nodules and mediastinal lymph nodes, no evidence of disease progression  2. COPD  3. Nonischemic cardiomyopathy  4. Implanted cardiac defibrillator  5. History of prostate cancer  6. Hypertension  7. History of Iron deficiency anemia-hemoglobin lower 3 20,018  8. Neutropenia secondary to chemotherapy-Neulasta added with cycle 3 FOLFOX 08/23/2015  9. 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 The hemoglobin is lower today. He denies bleeding. We will check a ferritin level. He will return for a CBC in 2 weeks. Mr.Sublett will contact us for symptoms of anemia. He will undergo a restaging CT evaluation prior to an office visit in 4 weeks.  15 minutes were spent with the patient today. The majority of the time was used for counseling and coordination of care.   Betsy Coder, MD  07/22/2016  11:51 AM

## 2016-08-05 ENCOUNTER — Other Ambulatory Visit: Payer: Medicare HMO

## 2016-08-05 ENCOUNTER — Encounter: Payer: Self-pay | Admitting: Internal Medicine

## 2016-08-11 ENCOUNTER — Other Ambulatory Visit: Payer: Medicare HMO

## 2016-08-11 ENCOUNTER — Telehealth: Payer: Self-pay | Admitting: *Deleted

## 2016-08-11 ENCOUNTER — Telehealth: Payer: Self-pay | Admitting: Internal Medicine

## 2016-08-11 ENCOUNTER — Other Ambulatory Visit: Payer: Self-pay | Admitting: *Deleted

## 2016-08-11 DIAGNOSIS — D509 Iron deficiency anemia, unspecified: Secondary | ICD-10-CM

## 2016-08-11 DIAGNOSIS — C162 Malignant neoplasm of body of stomach: Secondary | ICD-10-CM | POA: Diagnosis not present

## 2016-08-11 LAB — CBC WITH DIFFERENTIAL/PLATELET
BASO%: 0.4 % (ref 0.0–2.0)
BASOS ABS: 0 10*3/uL (ref 0.0–0.1)
EOS ABS: 0.1 10*3/uL (ref 0.0–0.5)
EOS%: 0.9 % (ref 0.0–7.0)
HEMATOCRIT: 22.6 % — AB (ref 38.4–49.9)
HEMOGLOBIN: 7.1 g/dL — AB (ref 13.0–17.1)
LYMPH#: 1 10*3/uL (ref 0.9–3.3)
LYMPH%: 16.2 % (ref 14.0–49.0)
MCH: 26.5 pg — ABNORMAL LOW (ref 27.2–33.4)
MCHC: 31.3 g/dL — ABNORMAL LOW (ref 32.0–36.0)
MCV: 84.7 fL (ref 79.3–98.0)
MONO#: 0.4 10*3/uL (ref 0.1–0.9)
MONO%: 6.8 % (ref 0.0–14.0)
NEUT#: 4.8 10*3/uL (ref 1.5–6.5)
NEUT%: 75.7 % — AB (ref 39.0–75.0)
Platelets: 268 10*3/uL (ref 140–400)
RBC: 2.67 10*6/uL — ABNORMAL LOW (ref 4.20–5.82)
RDW: 19.8 % — ABNORMAL HIGH (ref 11.0–14.6)
WBC: 6.3 10*3/uL (ref 4.0–10.3)

## 2016-08-11 LAB — FERRITIN: Ferritin: 31 ng/ml (ref 22–316)

## 2016-08-11 NOTE — Telephone Encounter (Signed)
CY  Please Advise-  Pt called in c/o increase sob, pt describes can not even tie his shoe without getting short winded. Coughing some, non productive, some wheezing mainly at night, Denies fever. Pt is requesting an appointment and is requesting to only see you.

## 2016-08-11 NOTE — Telephone Encounter (Signed)
Spoke with pt and informed him of appointment per CY. Pt was fine with the appointment. The appointment was made. Nothing further is needed

## 2016-08-11 NOTE — Telephone Encounter (Signed)
-----   Message from Ladell Pier, MD sent at 08/11/2016  5:58 PM EDT ----- Please call patien, hb is dropping, has he noticed any bleeding, schedule cbc and type/cross 4/10 with 2 units rbcs Will refer to GI.

## 2016-08-11 NOTE — Telephone Encounter (Addendum)
Pt can be seen Tuesday 08-12-16 at 11:30 am slot. Thanks

## 2016-08-11 NOTE — Telephone Encounter (Signed)
Called pt's home several times, wife reports he was out picking up dinner. Unable to reach him on mobile #. Called son with lab results. He will contact pt tonight and instruct him to call office for bleeding, dyspnea, chest pain or severe weakness.

## 2016-08-12 ENCOUNTER — Ambulatory Visit (HOSPITAL_BASED_OUTPATIENT_CLINIC_OR_DEPARTMENT_OTHER): Payer: Medicare HMO

## 2016-08-12 ENCOUNTER — Telehealth: Payer: Self-pay | Admitting: *Deleted

## 2016-08-12 ENCOUNTER — Telehealth: Payer: Self-pay | Admitting: Internal Medicine

## 2016-08-12 ENCOUNTER — Telehealth: Payer: Self-pay

## 2016-08-12 ENCOUNTER — Other Ambulatory Visit: Payer: Self-pay

## 2016-08-12 ENCOUNTER — Ambulatory Visit (INDEPENDENT_AMBULATORY_CARE_PROVIDER_SITE_OTHER): Payer: Medicare HMO | Admitting: Internal Medicine

## 2016-08-12 ENCOUNTER — Ambulatory Visit (HOSPITAL_COMMUNITY)
Admission: RE | Admit: 2016-08-12 | Discharge: 2016-08-12 | Disposition: A | Discharge status: Home / Self Care | Payer: Medicare HMO | Source: Ambulatory Visit | Attending: Oncology | Admitting: Oncology

## 2016-08-12 ENCOUNTER — Other Ambulatory Visit: Payer: Self-pay | Admitting: Nurse Practitioner

## 2016-08-12 ENCOUNTER — Ambulatory Visit (INDEPENDENT_AMBULATORY_CARE_PROVIDER_SITE_OTHER)
Admission: RE | Admit: 2016-08-12 | Discharge: 2016-08-12 | Disposition: A | Discharge status: Home / Self Care | Payer: Medicare HMO | Source: Ambulatory Visit | Attending: Internal Medicine | Admitting: Internal Medicine

## 2016-08-12 ENCOUNTER — Encounter: Payer: Self-pay | Admitting: Internal Medicine

## 2016-08-12 ENCOUNTER — Other Ambulatory Visit: Payer: Self-pay | Admitting: *Deleted

## 2016-08-12 VITALS — BP 106/58 | HR 64 | Ht 74.0 in | Wt 228.4 lb

## 2016-08-12 DIAGNOSIS — D649 Anemia, unspecified: Secondary | ICD-10-CM

## 2016-08-12 DIAGNOSIS — C162 Malignant neoplasm of body of stomach: Secondary | ICD-10-CM

## 2016-08-12 DIAGNOSIS — R0602 Shortness of breath: Secondary | ICD-10-CM | POA: Diagnosis not present

## 2016-08-12 DIAGNOSIS — D509 Iron deficiency anemia, unspecified: Secondary | ICD-10-CM | POA: Diagnosis not present

## 2016-08-12 DIAGNOSIS — C169 Malignant neoplasm of stomach, unspecified: Secondary | ICD-10-CM

## 2016-08-12 DIAGNOSIS — J449 Chronic obstructive pulmonary disease, unspecified: Secondary | ICD-10-CM | POA: Diagnosis not present

## 2016-08-12 DIAGNOSIS — J9621 Acute and chronic respiratory failure with hypoxia: Secondary | ICD-10-CM | POA: Diagnosis not present

## 2016-08-12 DIAGNOSIS — J441 Chronic obstructive pulmonary disease with (acute) exacerbation: Secondary | ICD-10-CM

## 2016-08-12 LAB — CBC WITH DIFFERENTIAL/PLATELET
BASO%: 0.5 % (ref 0.0–2.0)
Basophils Absolute: 0 10*3/uL (ref 0.0–0.1)
EOS ABS: 0 10*3/uL (ref 0.0–0.5)
EOS%: 0.5 % (ref 0.0–7.0)
HEMATOCRIT: 20.9 % — AB (ref 38.4–49.9)
HGB: 6.7 g/dL — CL (ref 13.0–17.1)
LYMPH#: 0.8 10*3/uL — AB (ref 0.9–3.3)
LYMPH%: 10.9 % — ABNORMAL LOW (ref 14.0–49.0)
MCH: 26.8 pg — ABNORMAL LOW (ref 27.2–33.4)
MCHC: 32 g/dL (ref 32.0–36.0)
MCV: 83.9 fL (ref 79.3–98.0)
MONO#: 0.4 10*3/uL (ref 0.1–0.9)
MONO%: 5 % (ref 0.0–14.0)
NEUT%: 83.1 % — ABNORMAL HIGH (ref 39.0–75.0)
NEUTROS ABS: 6.2 10*3/uL (ref 1.5–6.5)
Platelets: 264 10*3/uL (ref 140–400)
RBC: 2.49 10*6/uL — ABNORMAL LOW (ref 4.20–5.82)
RDW: 19.2 % — ABNORMAL HIGH (ref 11.0–14.6)
WBC: 7.5 10*3/uL (ref 4.0–10.3)

## 2016-08-12 LAB — PREPARE RBC (CROSSMATCH)

## 2016-08-12 MED ORDER — LEVALBUTEROL HCL 0.63 MG/3ML IN NEBU
0.6300 mg | INHALATION_SOLUTION | Freq: Once | RESPIRATORY_TRACT | Status: AC
  Administered 2016-08-12: 0.63 mg via RESPIRATORY_TRACT

## 2016-08-12 MED ORDER — FUROSEMIDE 10 MG/ML IJ SOLN: 20.0000 mg | Freq: Once | INTRAMUSCULAR | Status: DC

## 2016-08-12 MED ORDER — SODIUM CHLORIDE 0.9% FLUSH
10.0000 mL | INTRAVENOUS | Status: AC | PRN
  Administered 2016-08-12: 10 mL
  Filled 2016-08-12: qty 10

## 2016-08-12 MED ORDER — HEPARIN SOD (PORK) LOCK FLUSH 100 UNIT/ML IV SOLN
500.0000 [IU] | Freq: Every day | INTRAVENOUS | Status: AC | PRN
  Administered 2016-08-12: 500 [IU]
  Filled 2016-08-12: qty 5

## 2016-08-12 MED ORDER — HYDROCODONE-HOMATROPINE 5-1.5 MG/5ML PO SYRP: 5.0000 mL | ORAL_SOLUTION | Freq: Four times a day (QID) | ORAL | 0 refills | Status: DC | PRN

## 2016-08-12 MED ORDER — SODIUM CHLORIDE 0.9 % IV SOLN
250.0000 mL | Freq: Once | INTRAVENOUS | Status: AC
  Administered 2016-08-12: 250 mL via INTRAVENOUS

## 2016-08-12 MED ORDER — METHYLPREDNISOLONE ACETATE 80 MG/ML IJ SUSP
80.0000 mg | Freq: Once | INTRAMUSCULAR | Status: AC
  Administered 2016-08-12: 80 mg via INTRAMUSCULAR

## 2016-08-12 NOTE — Telephone Encounter (Signed)
-----   Message from Manus Gunning, MD sent at 08/12/2016  7:23 AM EDT ----- Regarding: update Almyra Free, Does Endo at [facility] have any open spots tomorrow AM at 0730 or at 130 PM for this patient (if not 130PM, any openings in the afternoon on Wed)? If nothing at [facility], I could also go to [facility]. It's my only time available time this week.  He needs anesthesia. Can you let me know?  Thanks  ----- Message ----- From: Ladell Pier, MD Sent: 08/11/2016   6:00 PM To: Milus Banister, MD, #  You diagnosed him with gastric cancer 1 year ago. He had metastatic disease and was treated with chemotherapy. His hemoglobin has dropped significantly over the past few months, 7 today. We will arrange for a transfusion. Can you get him in for a repeat endoscopy?  Thanks,  Julieanne Manson

## 2016-08-12 NOTE — Telephone Encounter (Signed)
-----   Message from Manus Gunning, MD sent at 08/12/2016  7:23 AM EDT ----- Regarding: update Almyra Free, Does Endo at [facility] have any open spots tomorrow AM at 0730 or at 130 PM for this patient (if not 130PM, any openings in the afternoon on Wed)? If nothing at [facility], I could also go to [facility]. It's my only time available time this week.  He needs anesthesia. Can you let me know?  Thanks  ----- Message ----- From: Ladell Pier, MD Sent: 08/11/2016   6:00 PM To: Milus Banister, MD, #  You diagnosed him with gastric cancer 1 year ago. He had metastatic disease and was treated with chemotherapy. His hemoglobin has dropped significantly over the past few months, 7 today. We will arrange for a transfusion. Can you get him in for a repeat endoscopy?  Thanks,  Julieanne Manson

## 2016-08-12 NOTE — Patient Instructions (Addendum)
Order- CXR- dx COPD exacerbation  Order- DME new O2 continuous and portable 2L    Dx COPD mixed type, gastric cancer                                                 83% at rest today  Order- neb xop 0.63   Dx COPD exacerbation              Depo 80  When you leave [facility] today you will be going back to [facility] for transfusion  Stop Uniphyl/ theophylline- This may be causing nausea  Script printed refilling cough syrup as requested  Please call as needed

## 2016-08-12 NOTE — Progress Notes (Signed)
1239 - Patient reported dark stools and dark (black) emesis. Reported to MD. Patient has endoscopy scheduled.   Wylene Simmer, BSN, RN

## 2016-08-12 NOTE — Assessment & Plan Note (Signed)
Question possibility that he has aspiration pneumonia but he is also quite anemic by report. Oxygen saturation 83% on rest arrival today Plan-begin oxygen 2 L continuous and portable

## 2016-08-12 NOTE — Telephone Encounter (Signed)
Pt was sent home with [facility] O2 tank today.

## 2016-08-12 NOTE — Assessment & Plan Note (Signed)
Exacerbation of dyspnea and cough. Multiple issues including his gastric cancer, nausea and vomiting, anemia. Plan-nebulizer treatments Xopenex, Depo-Medrol, CXR, refill cough syrup. Stopping theophylline.

## 2016-08-12 NOTE — Telephone Encounter (Signed)
Dr. [name] notified of critical HGB. Blood transfusion has been arranged at [facility] today. Noted endoscopy is scheduled for 4/11. Spoke with [name] at Dr. Janee Morn office to make them aware of lab results. Pt called pulmonology for dyspnea. They will send him back for transfusion after visit.

## 2016-08-12 NOTE — Progress Notes (Signed)
HPI male former smoker followed for COPD, complicated by DM 2, CAD/CM/AICD/pacer, anemia/FE, gastric cancer  FEV1 60% 02/07/14          Room Air Oxygen saturation on arrival 08/12/2813-83%-home oxygen started           -----------------------------------------------------------------------------------------------------  08/10/2015-82 year old male former smoker followed for COPD, complicated by DM 2, CAD/CM/AICD/pacer, anemia/FE, gastric cancer FOLLOWS FOR: Pt has not had any inahler use since first of March 2017. Insurance will not cover Dulera-phone note suggests having patient use Breo100 , Symbicort 160, or Advair 250. No changes in breathing since last visit.  Chemotherapy for gastric cancer. Still taking Uniphyl one half tablet daily-discussed. Occasional cough-asks refill cough syrup. Little mucus. No blood. Some postnasal drip at night.  02/11/2016-82 year old male former smoker followed for COPD, complicated by DM 2, CAD/CM/AICD/pacer, anemia/FE, gastric cancer FOLLOWS FOR pt has shortness of breath with uphill. BREO not covered by insurance will need a change  Not anemic on CBC 12/06/2015. FEV1 60% 02/07/14 Continues uniphyll 1/2 twice daily He felt less dyspnea on exertion with Breo but is overall stable. Little cough or wheeze. CT chest 01/18/2016 IMPRESSION: 1. No acute findings within the chest abdomen or pelvis. 2. Small pulmonary nodules are unchanged when compared with previous exam. 3. Stable appearance of hypervascular lesion within medial segment of left lobe of liver. 4. Small hiatal hernia and right inguinal hernia. Electronically Signed: By: Kerby Moors M.D. On: 12/24/2015 17:48  08/12/2016-82 year old male former smoker followed for COPD, complicated by DM 2, CAD/CM/AICD/pacer, anemia/FE, gastric cancer ACUTE VISIT: Pt states he started having increased SOB last Friday-vomitied prior to SOB starting. Pt may go for blood transfusion as well since he is anemic.   On arrival today sat 83%, improving to 93% on 2L O2 at rest.  [place] has called advising him hemoglobin low and he is to go over there after our visit today, for transfusion. He is pending endoscopy to evaluate blood loss anemia tomorrow. Chronic dyspnea on exertion. 4 days ago he had a vomiting episode and since then has felt more short of breath. Occasional mild cough with white sputum. Denies fever or chill and doesn't think he has an infection.  ROS-see HPI Constitutional:   No-   weight loss, night sweats, fevers, chills, fatigue, lassitude. HEENT:   No-  headaches, difficulty swallowing, tooth/dental problems, sore throat,       No-  sneezing, itching, ear ache, +nasal congestion, post nasal drip,  CV:  No-   chest pain, orthopnea, PND, swelling in lower extremities, anasarca, dizziness, palpitations Resp: + shortness of breath with exertion or at rest.              +productive cough,  + non-productive cough,  No- coughing up of blood.                change in color of mucus.  No- wheezing.   Skin: No-   rash or lesions. GI:  No-   heartburn, indigestion, abdominal pain, nausea, vomiting, GU:  MS:  No-   joint pain or swelling.   Neuro-     nothing unusual Psych:  No- change in mood or affect. No depression or anxiety.  No memory loss.  OBJ- Physical Exam General- Alert, Oriented, Affect-appropriate, Distress- + dyspnea on exertion Skin- rash-none, lesions- none, excoriation- none Lymphadenopathy- none Head- atraumatic            Eyes- Gross vision intact, PERRLA, conjunctivae and secretions clear  Ears- Hearing, canals-normal            Nose- Clear, no-Septal dev, mucus, polyps, erosion, perforation             Throat- Mallampati III , mucosa clear , drainage -none, tonsils- atrophic Neck- flexible , trachea midline, no stridor , thyroid nl, carotid no bruit Chest - symmetrical excursion , unlabored           Heart/CV- RRR , no murmur , no gallop  , no rub, nl  s1 s2                           - JVD- none , edema- none, stasis changes- none, varices- none           Lung- + congested R>L, wheeze- none, cough+ , dullness-none, rub- none           Chest wall-  L  AICD, R chest port Abd-  Br/ Gen/ Rectal- Not done, not indicated Extrem- cyanosis- none, clubbing, none, atrophy- none, strength- nl Neuro- grossly intact to observation

## 2016-08-12 NOTE — Telephone Encounter (Signed)
[facility] called back, unfortunately they will not be able to do the procedure tomorrow at 0730 due to a meeting. Rescheduled to 1:30, 4/11. Patient advised. Dr. Havery Moros notified.

## 2016-08-12 NOTE — Telephone Encounter (Signed)
Called and spoke with [name] from [facility]---he explained this to me and stated that [name] is coming to the office to explain this to CY.    Insurance will not cover the oxygen at this time due to the OV today was an ACUTE OV---and with the neb tx being started, the insurance will not cover this.  [name] stated that they can offer the pt a waiver to sign and they can set up the oxygen, but the pt will have to be private pay until the insurance will cover it.  Will forward to CY and Katie to make them aware.  Please advise. Thanks  No Known Allergies

## 2016-08-12 NOTE — Telephone Encounter (Signed)
Spoke to patient, he is advised about EGD scheduled for 4/11 at [place]. Advised to be NPO after midnight.

## 2016-08-12 NOTE — Patient Instructions (Signed)

## 2016-08-12 NOTE — Telephone Encounter (Signed)
Called pt with appt for blood transfusion at [facility] today. He will come in for lab, go to pulmonology appt and check in upon return for transcusion. Message to schedulers.

## 2016-08-13 ENCOUNTER — Encounter (HOSPITAL_COMMUNITY): Payer: Self-pay | Admitting: *Deleted

## 2016-08-13 ENCOUNTER — Ambulatory Visit (HOSPITAL_COMMUNITY): Payer: Medicare HMO | Admitting: Anesthesiology

## 2016-08-13 ENCOUNTER — Encounter (HOSPITAL_COMMUNITY)
Admission: RE | Disposition: A | Discharge status: Home / Self Care | Payer: Self-pay | Source: Ambulatory Visit | Attending: Gastroenterology

## 2016-08-13 ENCOUNTER — Ambulatory Visit (HOSPITAL_COMMUNITY)
Admission: RE | Admit: 2016-08-13 | Discharge: 2016-08-13 | Disposition: A | Discharge status: Home / Self Care | Payer: Medicare HMO | Source: Ambulatory Visit | Attending: Gastroenterology | Admitting: Gastroenterology

## 2016-08-13 ENCOUNTER — Telehealth: Payer: Self-pay | Admitting: Oncology

## 2016-08-13 ENCOUNTER — Encounter: Payer: Self-pay | Admitting: Radiation Oncology

## 2016-08-13 DIAGNOSIS — D649 Anemia, unspecified: Secondary | ICD-10-CM | POA: Diagnosis not present

## 2016-08-13 DIAGNOSIS — D509 Iron deficiency anemia, unspecified: Secondary | ICD-10-CM | POA: Diagnosis not present

## 2016-08-13 DIAGNOSIS — Z9221 Personal history of antineoplastic chemotherapy: Secondary | ICD-10-CM | POA: Insufficient documentation

## 2016-08-13 DIAGNOSIS — J449 Chronic obstructive pulmonary disease, unspecified: Secondary | ICD-10-CM | POA: Diagnosis not present

## 2016-08-13 DIAGNOSIS — I447 Left bundle-branch block, unspecified: Secondary | ICD-10-CM | POA: Insufficient documentation

## 2016-08-13 DIAGNOSIS — I428 Other cardiomyopathies: Secondary | ICD-10-CM | POA: Insufficient documentation

## 2016-08-13 DIAGNOSIS — T182XXA Foreign body in stomach, initial encounter: Secondary | ICD-10-CM

## 2016-08-13 DIAGNOSIS — C163 Malignant neoplasm of pyloric antrum: Secondary | ICD-10-CM | POA: Diagnosis not present

## 2016-08-13 DIAGNOSIS — I110 Hypertensive heart disease with heart failure: Secondary | ICD-10-CM | POA: Diagnosis not present

## 2016-08-13 DIAGNOSIS — K319 Disease of stomach and duodenum, unspecified: Secondary | ICD-10-CM | POA: Diagnosis not present

## 2016-08-13 DIAGNOSIS — C164 Malignant neoplasm of pylorus: Secondary | ICD-10-CM | POA: Diagnosis not present

## 2016-08-13 DIAGNOSIS — D500 Iron deficiency anemia secondary to blood loss (chronic): Secondary | ICD-10-CM | POA: Diagnosis not present

## 2016-08-13 DIAGNOSIS — E119 Type 2 diabetes mellitus without complications: Secondary | ICD-10-CM | POA: Diagnosis not present

## 2016-08-13 DIAGNOSIS — Z87891 Personal history of nicotine dependence: Secondary | ICD-10-CM | POA: Insufficient documentation

## 2016-08-13 DIAGNOSIS — E785 Hyperlipidemia, unspecified: Secondary | ICD-10-CM | POA: Insufficient documentation

## 2016-08-13 DIAGNOSIS — Q396 Congenital diverticulum of esophagus: Secondary | ICD-10-CM | POA: Diagnosis not present

## 2016-08-13 DIAGNOSIS — Z95810 Presence of automatic (implantable) cardiac defibrillator: Secondary | ICD-10-CM | POA: Insufficient documentation

## 2016-08-13 DIAGNOSIS — Z8719 Personal history of other diseases of the digestive system: Secondary | ICD-10-CM | POA: Diagnosis not present

## 2016-08-13 DIAGNOSIS — X58XXXA Exposure to other specified factors, initial encounter: Secondary | ICD-10-CM | POA: Diagnosis not present

## 2016-08-13 DIAGNOSIS — K317 Polyp of stomach and duodenum: Secondary | ICD-10-CM

## 2016-08-13 DIAGNOSIS — Z79891 Long term (current) use of opiate analgesic: Secondary | ICD-10-CM | POA: Insufficient documentation

## 2016-08-13 DIAGNOSIS — K311 Adult hypertrophic pyloric stenosis: Secondary | ICD-10-CM | POA: Diagnosis not present

## 2016-08-13 DIAGNOSIS — Z7951 Long term (current) use of inhaled steroids: Secondary | ICD-10-CM | POA: Insufficient documentation

## 2016-08-13 DIAGNOSIS — Z79899 Other long term (current) drug therapy: Secondary | ICD-10-CM | POA: Insufficient documentation

## 2016-08-13 DIAGNOSIS — R1310 Dysphagia, unspecified: Secondary | ICD-10-CM | POA: Diagnosis not present

## 2016-08-13 DIAGNOSIS — I5022 Chronic systolic (congestive) heart failure: Secondary | ICD-10-CM | POA: Insufficient documentation

## 2016-08-13 DIAGNOSIS — Z8546 Personal history of malignant neoplasm of prostate: Secondary | ICD-10-CM | POA: Insufficient documentation

## 2016-08-13 DIAGNOSIS — K210 Gastro-esophageal reflux disease with esophagitis: Secondary | ICD-10-CM | POA: Diagnosis not present

## 2016-08-13 HISTORY — PX: ESOPHAGOGASTRODUODENOSCOPY (EGD) WITH PROPOFOL: SHX5813

## 2016-08-13 LAB — BPAM RBC
Blood Product Expiration Date: 201804252359
Blood Product Expiration Date: 201804252359
ISSUE DATE / TIME: 201804101215
ISSUE DATE / TIME: 201804101215
Unit Type and Rh: 6200
Unit Type and Rh: 6200

## 2016-08-13 LAB — TYPE AND SCREEN
ABO/RH(D): A POS
Antibody Screen: NEGATIVE
UNIT DIVISION: 0
UNIT DIVISION: 0

## 2016-08-13 SURGERY — ESOPHAGOGASTRODUODENOSCOPY (EGD) WITH PROPOFOL
Anesthesia: General

## 2016-08-13 MED ORDER — SODIUM CHLORIDE 0.9 % IV SOLN: INTRAVENOUS | Status: DC

## 2016-08-13 MED ORDER — PROPOFOL 500 MG/50ML IV EMUL
INTRAVENOUS | Status: DC | PRN
  Administered 2016-08-13: 75 ug/kg/min via INTRAVENOUS

## 2016-08-13 MED ORDER — ONDANSETRON HCL 4 MG/2ML IJ SOLN
INTRAMUSCULAR | Status: DC | PRN
  Administered 2016-08-13: 4 mg via INTRAVENOUS

## 2016-08-13 MED ORDER — PHENYLEPHRINE HCL 10 MG/ML IJ SOLN
INTRAMUSCULAR | Status: DC | PRN
  Administered 2016-08-13 (×4): 80 ug via INTRAVENOUS

## 2016-08-13 MED ORDER — PROPOFOL 10 MG/ML IV BOLUS
INTRAVENOUS | Status: AC
  Filled 2016-08-13: qty 40

## 2016-08-13 MED ORDER — PROPOFOL 500 MG/50ML IV EMUL
INTRAVENOUS | Status: DC | PRN
  Administered 2016-08-13: 40 mg via INTRAVENOUS

## 2016-08-13 MED ORDER — SUCCINYLCHOLINE CHLORIDE 20 MG/ML IJ SOLN
INTRAMUSCULAR | Status: DC | PRN
  Administered 2016-08-13: 100 mg via INTRAVENOUS

## 2016-08-13 MED ORDER — LACTATED RINGERS IV SOLN
INTRAVENOUS | Status: DC
  Administered 2016-08-13: 1000 mL via INTRAVENOUS

## 2016-08-13 MED ORDER — ONDANSETRON HCL 4 MG/2ML IJ SOLN
INTRAMUSCULAR | Status: AC
Start: 2016-08-13 — End: 2016-08-13
  Filled 2016-08-13: qty 2

## 2016-08-13 MED ORDER — SUCCINYLCHOLINE CHLORIDE 200 MG/10ML IV SOSY
PREFILLED_SYRINGE | INTRAVENOUS | Status: AC
  Filled 2016-08-13: qty 10

## 2016-08-13 SURGICAL SUPPLY — 15 items

## 2016-08-13 NOTE — Anesthesia Procedure Notes (Signed)
Procedure Name: Intubation Date/Time: 08/13/2016 1:49 PM Performed by: Glory Buff Pre-anesthesia Checklist: Patient identified, Emergency Drugs available, Suction available and Patient being monitored Patient Re-evaluated:Patient Re-evaluated prior to inductionOxygen Delivery Method: Circle system utilized Preoxygenation: Pre-oxygenation with 100% oxygen Intubation Type: IV induction Ventilation: Mask ventilation without difficulty Laryngoscope Size: Miller and 3 Grade View: Grade I Tube type: Oral Tube size: 7.5 mm Number of attempts: 1 Airway Equipment and Method: Stylet and Oral airway Placement Confirmation: ETT inserted through vocal cords under direct vision,  positive ETCO2 and breath sounds checked- equal and bilateral Secured at: 22 cm Tube secured with: Tape Dental Injury: Teeth and Oropharynx as per pre-operative assessment

## 2016-08-13 NOTE — Interval H&P Note (Signed)
History and Physical Interval Note:  08/13/2016 1:28 PM  [name]  has presented today for surgery, with the diagnosis of dsyphagia  The various methods of treatment have been discussed with the patient and family. After consideration of risks, benefits and other options for treatment, the patient has consented to  Procedure(s): ESOPHAGOGASTRODUODENOSCOPY (EGD) WITH PROPOFOL (N/A) as a surgical intervention .  The patient's history has been reviewed, patient examined, no change in status, stable for surgery.  I have reviewed the patient's chart and labs.  Questions were answered to the patient's satisfaction.     [name]

## 2016-08-13 NOTE — Telephone Encounter (Signed)
Spoke with [name] with [facility], who states pt will need to try and fail xopenex treatment before O2 will be covered by insurance. This issue is due to pt being in acute state and not chronic stable state during visit. [facility] can offer pt a wavier to sign and can set pt up on oxygen, however pt will be considered private pay. Insurance will pay for oxygen once pt is tested during stable chronic stable state, after failing xopenex treatment.    CY please advise. Thanks.

## 2016-08-13 NOTE — Anesthesia Preprocedure Evaluation (Addendum)
Anesthesia Evaluation  Patient identified by MRN, date of birth, ID band Patient awake    Reviewed: Allergy & Precautions, NPO status , Patient's Chart, lab work & pertinent test results  Airway Mallampati: II  TM Distance: >3 FB Neck ROM: Full    Dental no notable dental hx.    Pulmonary asthma , COPD, former smoker,    breath sounds clear to auscultation + decreased breath sounds      Cardiovascular hypertension, Normal cardiovascular exam+ Cardiac Defibrillator  Rhythm:Regular Rate:Normal     Neuro/Psych negative neurological ROS  negative psych ROS   GI/Hepatic negative GI ROS, Neg liver ROS,   Endo/Other  diabetes  Renal/GU negative Renal ROS  negative genitourinary   Musculoskeletal negative musculoskeletal ROS (+)   Abdominal   Peds negative pediatric ROS (+)  Hematology  (+) anemia ,   Anesthesia Other Findings   Reproductive/Obstetrics negative OB ROS                            Anesthesia Physical Anesthesia Plan  ASA: IV  Anesthesia Plan: MAC   Post-op Pain Management:    Induction: Intravenous  Airway Management Planned: Nasal Cannula  Additional Equipment:   Intra-op Plan:   Post-operative Plan:   Informed Consent: I have reviewed the patients History and Physical, chart, labs and discussed the procedure including the risks, benefits and alternatives for the proposed anesthesia with the patient or authorized representative who has indicated his/her understanding and acceptance.   Dental advisory given  Plan Discussed with: CRNA and Surgeon  Anesthesia Plan Comments:         Anesthesia Quick Evaluation

## 2016-08-13 NOTE — Consult Note (Signed)
HPI :  82 y/o male with multiple medical problems including metastatic gastric cancer treated with chemotherapy per Dr. Ammie Dalton, completed in 12/24/15 with stable disease since that time, presenting with worsening anemia in recent weeks. Hgb has dropped from 13s to high 6s over the course of a few months. He reports chronically dark / black stools while being on iron. Denies NSAID use. Using omeprazole '40mg'$  once daily. He received 2 units PRBC yesterday and feeling much better today. Last colonoscopy in 2015 with a few polyps removed. Last EGD 06/27/15 at time of diagnosis of gastric cancer.    Past Medical History:  Diagnosis Date  . AICD (automatic cardioverter/defibrillator) present   . Anemia   . Arthritis       . Asthma   . Chronic systolic heart failure ([place])   . Colon polyps   . COPD (chronic obstructive pulmonary disease) (HCC)    FEV1 2.68/72%, FEV1/FVC 0.63 (08/22/04  . History of blood transfusion   . HTN (hypertension)   . Hyperlipidemia   . LBBB (left bundle branch block)    [name] 06/20/2014  . Malignant neoplasm pyloric antrum ([place])   . Non-ischemic cardiomyopathy ([place])    a. s/p STJ CRTD 07/2014  . Pneumonia   . Primary cancer of stomach with metastasis to other site ([place]) 07/12/2015  . Prostate cancer ([place]) 2010  . Shortness of breath dyspnea      Past Surgical History:  Procedure Laterality Date  . BI-VENTRICULAR IMPLANTABLE CARDIOVERTER DEFIBRILLATOR N/A 07/17/2014   STJ CRTD implanted by Dr Lovena Le  . CATARACT EXTRACTION W/ INTRAOCULAR LENS  IMPLANT, BILATERAL Bilateral 2010  . COLONOSCOPY    . ESOPHAGOGASTRODUODENOSCOPY (EGD) WITH PROPOFOL N/A 06/27/2015   Procedure: ESOPHAGOGASTRODUODENOSCOPY (EGD) WITH PROPOFOL;  Surgeon: Manus Gunning, MD;  Location: [facility];  Service: Gastroenterology;  Laterality: N/A;  . LEFT HEART CATHETERIZATION WITH CORONARY ANGIOGRAM N/A 01/04/2014   no obstructive CAD  . PORTACATH PLACEMENT N/A 07/11/2015   Procedure:  INSERTION PORT-A-CATH WITH ULTRA SOUND GUIDANCE;  Surgeon: Stark Klein, MD;  Location: [facility];  Service: General;  Laterality: N/A;  . PROSTATECTOMY    . ROBOT ASSISTED LAPAROSCOPIC RADICAL PROSTATECTOMY  2010   Family History  Problem Relation Age of Onset  . Arthritis Other   . Esophagitis Mother     STRICTURE  . Stroke Mother   . Hypertension Mother   . Prostate cancer Father   . Hypertension Father   . Heart attack Neg Hx    Social History  Substance Use Topics  . Smoking status: Former Smoker    Packs/day: 1.50    Years: 40.00    Types: Cigarettes    Quit date: 05/05/2002  . Smokeless tobacco: Never Used  . Alcohol use No   Current Facility-Administered Medications  Medication Dose Route Frequency Provider Last Rate Last Dose  . 0.9 %  sodium chloride infusion   Intravenous Continuous Manus Gunning, MD      . lactated ringers infusion   Intravenous Continuous Manus Gunning, MD 10 mL/hr at 08/13/16 1250 1,000 mL at 08/13/16 1250   Facility-Administered Medications Ordered in Other Encounters  Medication Dose Route Frequency Provider Last Rate Last Dose  . alteplase (CATHFLO ACTIVASE) injection 2 mg  2 mg Intracatheter Once PRN Ladell Pier, MD      . heparin lock flush 100 unit/mL  250 Units Intracatheter Once PRN Ladell Pier, MD       No Known Allergies  Review of Systems: All systems reviewed and negative except where noted in HPI.    Dg Chest 2 View  Result Date: 08/12/2016 CLINICAL DATA:  82 year old male with increasing shortness of breath after recent chemotherapy for gastric cancer. EXAM: CHEST  2 VIEW COMPARISON:  Restaging chest CT 12/24/2015 and earlier. FINDINGS: Right chest porta cath and left chest cardiac pacemaker appear stable. Mediastinal contours are stable and within normal limits. Visualized tracheal air column is within normal limits. The lungs are clear. No pneumothorax or pleural effusion. Chronic left lateral seventh and  eighth rib fractures. No acute osseous abnormality identified. Negative visible bowel gas pattern. IMPRESSION: No acute cardiopulmonary abnormality. Electronically Signed   By: Genevie Ann M.D.   On: 08/12/2016 12:31   CBC Latest Ref Rng & Units 08/12/2016 08/11/2016 07/22/2016  WBC 4.0 - 10.3 10e3/uL 7.5 6.3 7.3  Hemoglobin 13.0 - 17.1 g/dL 6.7(LL) 7.1(L) 10.1(L)  Hematocrit 38.4 - 49.9 % 20.9(L) 22.6(L) 30.8(L)  Platelets 140 - 400 10e3/uL 264 268 227     Physical Exam: BP (!) 130/51   Temp 97.9 F (36.6 C) (Oral)   Resp 19   SpO2 100%  Constitutional: Pleasant,well-developed, male in no acute distress. HEENT: Normocephalic and atraumatic. No scleral icterus. Neck supple.  Cardiovascular: Normal rate, regular rhythm.  Pulmonary/chest: Effort normal and breath sounds normal. No wheezing, rales or rhonchi. Abdominal: Soft, nondistended, nontender.  There are no masses palpable. No hepatomegaly. Extremities: no edema Lymphadenopathy: No cervical adenopathy noted. Neurological: Alert and oriented to person place and time. Skin: Skin is warm and dry. No rashes noted. Psychiatric: Normal mood and affect. Behavior is normal.   ASSESSMENT AND PLAN: 82 y/o male with a history of metastatic gastric cancer s/p chemotherapy, with worsening anemia and chronically black stools. History as above. Plan on EGD to further evaluate today. Suspect his gastric cancer is the likely cause of this and not sure if anything we find will be amenable to endoscopic therapy but will await findings. Discussed risks / benefits of endoscopy and he wished to proceed. All questions answered.   [place] Cellar, MD [facility] Pager [phone]    No ref. provider found

## 2016-08-13 NOTE — Telephone Encounter (Signed)
Spoke with patient re lab/fu 4/13

## 2016-08-13 NOTE — Anesthesia Postprocedure Evaluation (Addendum)
Anesthesia Post Note  Patient: [name]  Procedure(s) Performed: Procedure(s) (LRB): ESOPHAGOGASTRODUODENOSCOPY (EGD) WITH PROPOFOL (N/A)  Patient location during evaluation: PACU Anesthesia Type: General Level of consciousness: awake and alert Pain management: pain level controlled Vital Signs Assessment: post-procedure vital signs reviewed and stable Respiratory status: spontaneous breathing, nonlabored ventilation, respiratory function stable and patient connected to nasal cannula oxygen Cardiovascular status: blood pressure returned to baseline and stable Postop Assessment: no signs of nausea or vomiting Anesthetic complications: no       Last Vitals:  Vitals:   08/13/16 1237 08/13/16 1448  BP: (!) 130/51 (!) 110/50  Pulse:  70  Resp: 19 (!) 21  Temp: 36.6 C 36.4 C    Last Pain:  Vitals:   08/13/16 1448  TempSrc: Oral                 Larnell Granlund S

## 2016-08-13 NOTE — H&P (View-Only) (Signed)
HPI :  82 y/o male with multiple medical problems including metastatic gastric cancer treated with chemotherapy per Dr. Ammie Dalton, completed in 12/24/15 with stable disease since that time, presenting with worsening anemia in recent weeks. Hgb has dropped from 13s to high 6s over the course of a few months. He reports chronically dark / black stools while being on iron. Denies NSAID use. Using omeprazole '40mg'$  once daily. He received 2 units PRBC yesterday and feeling much better today. Last colonoscopy in 2015 with a few polyps removed. Last EGD 06/27/15 at time of diagnosis of gastric cancer.    Past Medical History:  Diagnosis Date  . AICD (automatic cardioverter/defibrillator) present   . Anemia   . Arthritis       . Asthma   . Chronic systolic heart failure ([place])   . Colon polyps   . COPD (chronic obstructive pulmonary disease) (H[facility])    FEV1 2.68/72%, FEV1/FVC 0.63 (08/22/04  . History of blood transfusion   . HTN (hypertension)   . Hyperlipidemia   . LBBB (left bundle branch block)    [name] 06/20/2014  . Malignant neoplasm pyloric antrum ([place])   . Non-ischemic cardiomyopathy ([place])    a. s/p STJ CRTD 07/2014  . Pneumonia   . Primary cancer of stomach with metastasis to other site ([place]) 07/12/2015  . Prostate cancer ([place]) 2010  . Shortness of breath dyspnea      Past Surgical History:  Procedure Laterality Date  . BI-VENTRICULAR IMPLANTABLE CARDIOVERTER DEFIBRILLATOR N/A 07/17/2014   STJ CRTD implanted by Dr Lovena Le  . CATARACT EXTRACTION W/ INTRAOCULAR LENS  IMPLANT, BILATERAL Bilateral 2010  . COLONOSCOPY    . ESOPHAGOGASTRODUODENOSCOPY (EGD) WITH PROPOFOL N/A 06/27/2015   Procedure: ESOPHAGOGASTRODUODENOSCOPY (EGD) WITH PROPOFOL;  Surgeon: Manus Gunning, MD;  Location: [facility];  Service: Gastroenterology;  Laterality: N/A;  . LEFT HEART CATHETERIZATION WITH CORONARY ANGIOGRAM N/A 01/04/2014   no obstructive CAD  . PORTACATH PLACEMENT N/A 07/11/2015   Procedure:  INSERTION PORT-A-CATH WITH ULTRA SOUND GUIDANCE;  Surgeon: Stark Klein, MD;  Location: [facility];  Service: General;  Laterality: N/A;  . PROSTATECTOMY    . ROBOT ASSISTED LAPAROSCOPIC RADICAL PROSTATECTOMY  2010   Family History  Problem Relation Age of Onset  . Arthritis Other   . Esophagitis Mother     STRICTURE  . Stroke Mother   . Hypertension Mother   . Prostate cancer Father   . Hypertension Father   . Heart attack Neg Hx    Social History  Substance Use Topics  . Smoking status: Former Smoker    Packs/day: 1.50    Years: 40.00    Types: Cigarettes    Quit date: 05/05/2002  . Smokeless tobacco: Never Used  . Alcohol use No   Current Facility-Administered Medications  Medication Dose Route Frequency Provider Last Rate Last Dose  . 0.9 %  sodium chloride infusion   Intravenous Continuous Manus Gunning, MD      . lactated ringers infusion   Intravenous Continuous Manus Gunning, MD 10 mL/hr at 08/13/16 1250 1,000 mL at 08/13/16 1250   Facility-Administered Medications Ordered in Other Encounters  Medication Dose Route Frequency Provider Last Rate Last Dose  . alteplase (CATHFLO ACTIVASE) injection 2 mg  2 mg Intracatheter Once PRN Ladell Pier, MD      . heparin lock flush 100 unit/mL  250 Units Intracatheter Once PRN Ladell Pier, MD       No Known Allergies  Review of Systems: All systems reviewed and negative except where noted in HPI.    Dg Chest 2 View  Result Date: 08/12/2016 CLINICAL DATA:  82 year old male with increasing shortness of breath after recent chemotherapy for gastric cancer. EXAM: CHEST  2 VIEW COMPARISON:  Restaging chest CT 12/24/2015 and earlier. FINDINGS: Right chest porta cath and left chest cardiac pacemaker appear stable. Mediastinal contours are stable and within normal limits. Visualized tracheal air column is within normal limits. The lungs are clear. No pneumothorax or pleural effusion. Chronic left lateral seventh and  eighth rib fractures. No acute osseous abnormality identified. Negative visible bowel gas pattern. IMPRESSION: No acute cardiopulmonary abnormality. Electronically Signed   By: Genevie Ann M.D.   On: 08/12/2016 12:31   CBC Latest Ref Rng & Units 08/12/2016 08/11/2016 07/22/2016  WBC 4.0 - 10.3 10e3/uL 7.5 6.3 7.3  Hemoglobin 13.0 - 17.1 g/dL 6.7(LL) 7.1(L) 10.1(L)  Hematocrit 38.4 - 49.9 % 20.9(L) 22.6(L) 30.8(L)  Platelets 140 - 400 10e3/uL 264 268 227     Physical Exam: BP (!) 130/51   Temp 97.9 F (36.6 C) (Oral)   Resp 19   SpO2 100%  Constitutional: Pleasant,well-developed, male in no acute distress. HEENT: Normocephalic and atraumatic. No scleral icterus. Neck supple.  Cardiovascular: Normal rate, regular rhythm.  Pulmonary/chest: Effort normal and breath sounds normal. No wheezing, rales or rhonchi. Abdominal: Soft, nondistended, nontender.  There are no masses palpable. No hepatomegaly. Extremities: no edema Lymphadenopathy: No cervical adenopathy noted. Neurological: Alert and oriented to person place and time. Skin: Skin is warm and dry. No rashes noted. Psychiatric: Normal mood and affect. Behavior is normal.   ASSESSMENT AND PLAN: 82 y/o male with a history of metastatic gastric cancer s/p chemotherapy, with worsening anemia and chronically black stools. History as above. Plan on EGD to further evaluate today. Suspect his gastric cancer is the likely cause of this and not sure if anything we find will be amenable to endoscopic therapy but will await findings. Discussed risks / benefits of endoscopy and he wished to proceed. All questions answered.   [place] Cellar, MD [facility] Pager [phone]    No ref. provider found

## 2016-08-13 NOTE — Discharge Instructions (Signed)
Esophagogastroduodenoscopy, Care After °Refer to this sheet in the next few weeks. These instructions provide you with information about caring for yourself after your procedure. Your health care provider may also give you more specific instructions. Your treatment has been planned according to current medical practices, but problems sometimes occur. Call your health care provider if you have any problems or questions after your procedure. °What can I expect after the procedure? °After the procedure, it is common to have: °· A sore throat. °· Nausea. °· Bloating. °· Dizziness. °· Fatigue. °Follow these instructions at home: °· Do not eat or drink anything until the numbing medicine (local anesthetic) has worn off and your gag reflex has returned. You will know that the local anesthetic has worn off when you can swallow comfortably. °· Do not drive for 24 hours if you received a medicine to help you relax (sedative). °· If your health care provider took a tissue sample for testing during the procedure, make sure to get your test results. This is your responsibility. Ask your health care provider or the department performing the test when your results will be ready. °· Keep all follow-up visits as told by your health care provider. This is important. °Contact a health care provider if: °· You cannot stop coughing. °· You are not urinating. °· You are urinating less than usual. °Get help right away if: °· You have trouble swallowing. °· You cannot eat or drink. °· You have throat or chest pain that gets worse. °· You are dizzy or light-headed. °· You faint. °· You have nausea or vomiting. °· You have chills. °· You have a fever. °· You have severe abdominal pain. °· You have black, tarry, or bloody stools. °This information is not intended to replace advice given to you by your health care provider. Make sure you discuss any questions you have with your health care provider. °Document Released: 04/07/2012 Document  Revised: 09/27/2015 Document Reviewed: 03/15/2015 °Elsevier Interactive Patient Education © 2017 [employer] ° °

## 2016-08-13 NOTE — Transfer of Care (Signed)
Immediate Anesthesia Transfer of Care Note  Patient: [name]  Procedure(s) Performed: Procedure(s): ESOPHAGOGASTRODUODENOSCOPY (EGD) WITH PROPOFOL (N/A)  Patient Location: PACU  Anesthesia Type:General  Level of Consciousness: awake, alert  and oriented  Airway & Oxygen Therapy: Patient Spontanous Breathing and Patient connected to nasal cannula oxygen  Post-op Assessment: Report given to RN and Post -op Vital signs reviewed and stable  Post vital signs: Reviewed and stable  Last Vitals:  Vitals:   08/13/16 1237  BP: (!) 130/51  Resp: 19  Temp: 36.6 C    Last Pain:  Vitals:   08/13/16 1237  TempSrc: Oral         Complications: No apparent anesthesia complications

## 2016-08-13 NOTE — Op Note (Signed)
[facility] Patient Name: [name] Procedure Date: 08/13/2016 MRN: [id number] Attending MD: Carlota Raspberry. Benjermin Korber MD, MD Date of Birth: 1934-06-28 CSN: [id number] Age: 82 Admit Type: Outpatient Procedure:                Upper GI endoscopy Indications:              Progressive anemia, history of metastatic gastric                            cancer Providers:                Carlota Raspberry. Yared Barefoot MD, MD, Hilma Favors, RN, Alfonso Patten, Technician, Rosario Adie, CRNA Referring MD:              Medicines:                Monitored Anesthesia Care Complications:            No immediate complications. Estimated blood loss:                            Minimal. Estimated Blood Loss:     Estimated blood loss was minimal. Procedure:                Pre-Anesthesia Assessment:                           - Prior to the procedure, a History and Physical                            was performed, and patient medications and                            allergies were reviewed. The patient's tolerance of                            previous anesthesia was also reviewed. The risks                            and benefits of the procedure and the sedation                            options and risks were discussed with the patient.                            All questions were answered, and informed consent                            was obtained. Prior Anticoagulants: The patient has                            taken no previous anticoagulant or antiplatelet                            agents.  ASA Grade Assessment: III - A patient with                            severe systemic disease. After reviewing the risks                            and benefits, the patient was deemed in                            satisfactory condition to undergo the procedure.                           After obtaining informed consent, the endoscope was   passed under direct vision. Throughout the                            procedure, the patient's blood pressure, pulse, and                            oxygen saturations were monitored continuously. The                            Endoscope was introduced through the mouth, and                            advanced to the pylorus. The upper GI endoscopy was                            accomplished without difficulty. The patient                            tolerated the procedure well. Scope In: Scope Out: Findings:      Esophagogastric landmarks were identified: the Z-line was found at 43 cm       and the gastroesophageal junction was found at 43 cm from the incisors.      A diverticulum with a large opening was found in the lower third of the       esophagus.      The exam of the esophagus was otherwise normal.      A large, ulcerated, circumferential mass with no bleeding was found in       the gastric antrum and at the pylorus, causing partial outlet       obstruction. The endoscope could not traverse the mass.      A large amount of food residue was found in the gastric fundus and in       the gastric body, but no residual liquid. The patient was electively       intubated for airway protection. Removal of majority of the residual       food was accomplished with multiple passes with the Jabier Mutton net, in order       to prevent future aspiration.      No additional obvious pathology was noted following removal of food       bolus, although views limited in some areas due to small areas of       residual food secretions. Impression:               -  Esophagogastric landmarks identified.                           - Diverticulum in the lower third of the esophagus.                           - Ulcerated malignant gastric tumor at the pylorus                            and in the gastric antrum with evidence of disease                            progression, causing the patient's anemia and                             partial gastric outlet obstruction.                           - A large amount of residual food in the stomach                            due to partial outlet obstruction. Removal was                            successful. Moderate Sedation:      No moderate sedation, case performed with MAC Recommendation:           - Patient has a contact number available for                            emergencies. The signs and symptoms of potential                            delayed complications were discussed with the                            patient. Return to normal activities tomorrow.                            Written discharge instructions were provided to the                            patient.                           - Liquid diet only for now (I think the patient can                            tolerate liquids at this time, he is not completely                            obstructed, but lumen is small). No solid foods                           -  Continue present medications                           - Increase omeprazole to 60m twice daily                           - No NSAIDs                           - Repeat CBC with Dr. SBenay Spicetomorrow, transfuse                            further PRBC as needed                           - I discussed case with Dr. SBenay Spice anticipate                            treatment with radiation for further therapy,                            possible chemotherapy in near future                           - If patient becomes intolerate to liquids and                            symptoms of vomiting (symptoms of complete                            obstruction), please stop all PO intake and contact                            u[place]for immediate evaluation Procedure Code(s):        --- Professional ---                           [id number], Esophagogastroduodenoscopy, flexible,                            transoral; with removal of foreign  body(s) Diagnosis Code(s):        --- Professional ---                           Q39.6, Congenital diverticulum of esophagus                           C16.4, Malignant neoplasm of pylorus                           C16.3, Malignant neoplasm of pyloric antrum                           T18.2XXA, Foreign body in stomach, initial encounter  D50.0, Iron deficiency anemia secondary to blood                            loss (chronic) CPT copyright 2016 American [employer]. All rights reserved. The codes documented in this report are preliminary and upon coder review may  be revised to meet current compliance requirements. Remo Lipps P. Tristen Luce MD, MD 08/13/2016 2:52:36 PM This report has been signed electronically. Number of Addenda: 0

## 2016-08-14 NOTE — Telephone Encounter (Signed)
Spoke with pt, and made him aware that he will need to be retested at chronic stable state in order for insurance to pay. Pt states he is feeling a little better after blood transfusion. Pt states he will call next week to scheduled f/u to be retested. Pt voiced his understanding and had no further questions. nothing further needed.

## 2016-08-14 NOTE — Telephone Encounter (Signed)
Pt son called on pt behalf wanting to know why he hasn't gotten his oxygen yet - Advised son that due to insurance complication we need to decide how best to move forward. Patient son was understanding and asks [place] to call pt back at home at [phone] -pr

## 2016-08-15 ENCOUNTER — Encounter (HOSPITAL_COMMUNITY): Payer: Self-pay | Admitting: Gastroenterology

## 2016-08-15 ENCOUNTER — Ambulatory Visit (HOSPITAL_COMMUNITY)
Admission: RE | Admit: 2016-08-15 | Discharge: 2016-08-15 | Disposition: A | Discharge status: Home / Self Care | Payer: Medicare HMO | Source: Ambulatory Visit | Attending: Oncology | Admitting: Oncology

## 2016-08-15 ENCOUNTER — Other Ambulatory Visit (HOSPITAL_BASED_OUTPATIENT_CLINIC_OR_DEPARTMENT_OTHER): Payer: Medicare HMO

## 2016-08-15 ENCOUNTER — Ambulatory Visit (HOSPITAL_BASED_OUTPATIENT_CLINIC_OR_DEPARTMENT_OTHER): Payer: Medicare HMO | Admitting: Nurse Practitioner

## 2016-08-15 ENCOUNTER — Other Ambulatory Visit: Payer: Self-pay | Admitting: Medical Oncology

## 2016-08-15 VITALS — BP 149/52 | HR 84 | Temp 98.4°F | Resp 20 | Wt 230.4 lb

## 2016-08-15 DIAGNOSIS — C7972 Secondary malignant neoplasm of left adrenal gland: Secondary | ICD-10-CM | POA: Diagnosis not present

## 2016-08-15 DIAGNOSIS — C169 Malignant neoplasm of stomach, unspecified: Secondary | ICD-10-CM

## 2016-08-15 DIAGNOSIS — G620 Drug-induced polyneuropathy: Secondary | ICD-10-CM

## 2016-08-15 DIAGNOSIS — I10 Essential (primary) hypertension: Secondary | ICD-10-CM

## 2016-08-15 DIAGNOSIS — Z8546 Personal history of malignant neoplasm of prostate: Secondary | ICD-10-CM

## 2016-08-15 DIAGNOSIS — C162 Malignant neoplasm of body of stomach: Secondary | ICD-10-CM

## 2016-08-15 DIAGNOSIS — J449 Chronic obstructive pulmonary disease, unspecified: Secondary | ICD-10-CM | POA: Diagnosis not present

## 2016-08-15 DIAGNOSIS — D701 Agranulocytosis secondary to cancer chemotherapy: Secondary | ICD-10-CM

## 2016-08-15 DIAGNOSIS — D500 Iron deficiency anemia secondary to blood loss (chronic): Secondary | ICD-10-CM

## 2016-08-15 LAB — CBC WITH DIFFERENTIAL/PLATELET
BASO%: 0.4 % (ref 0.0–2.0)
Basophils Absolute: 0 10*3/uL (ref 0.0–0.1)
EOS ABS: 0.1 10*3/uL (ref 0.0–0.5)
EOS%: 1.4 % (ref 0.0–7.0)
HCT: 27 % — ABNORMAL LOW (ref 38.4–49.9)
HGB: 8.7 g/dL — ABNORMAL LOW (ref 13.0–17.1)
LYMPH%: 16.7 % (ref 14.0–49.0)
MCH: 27.2 pg (ref 27.2–33.4)
MCHC: 32.1 g/dL (ref 32.0–36.0)
MCV: 84.6 fL (ref 79.3–98.0)
MONO#: 0.5 10*3/uL (ref 0.1–0.9)
MONO%: 7.7 % (ref 0.0–14.0)
NEUT#: 4.7 10*3/uL (ref 1.5–6.5)
NEUT%: 73.8 % (ref 39.0–75.0)
Platelets: 249 10*3/uL (ref 140–400)
RBC: 3.19 10*6/uL — AB (ref 4.20–5.82)
RDW: 18.4 % — ABNORMAL HIGH (ref 11.0–14.6)
WBC: 6.4 10*3/uL (ref 4.0–10.3)
lymph#: 1.1 10*3/uL (ref 0.9–3.3)

## 2016-08-15 NOTE — Progress Notes (Signed)
GI Location of Tumor / Histology:Stomach cancer metastatic  Progression,   [name] presented  months ago with symptoms of: dysphasia,  progressive anemia , shortness breath; Chronic black stools , shortness Biopsies of  (if applicable) revealed: Diagnosis 06/27/15 1. Stomach, biopsy, Gastric mass bx - ADENOCARCINOMA. 2. Stomach, biopsy, gastric body bx - CHRONIC MILDLY ACTIVE GASTRITIS.- WARTHIN-STARRY STAIN NEGATIVE FOR HELICOBACTER PYLORI.- NO INTESTINAL METAPLASIA, DYSPLASIA, OR MALIGNANCY  Past/Anticipated interventions by surgeon, if any: Upper Gi Endoscopy 4/11/218: Dr. Remo Lipps Armbruster,MD  Procedure(s) Performed: Procedure(s) (LRB):08/13/2016 ESOPHAGOGASTRODUODENOSCOPY (EGD) WITH PROPOFOL (N/A)   Impression: Diverticulum in lower 3rd of the esophagus,Ulcerated malignant gastric tumor at the pylorus and in the gastric antrum with evidence of disease progression, Causing the patient's amenia and partial gastric obstruction.removal successful, liquid diet only   Past/Anticipated interventions by medical oncology, if any: Dr. [name] , completed chemotherapy 12/24/15, Ned Card appt 08/19/16  Weight changes, if any: 5 lb wight loss approx Bowel/Bladder complaints, if any: black stools . Last week, now has cleared up some stated patient,normal bladder, just frequency, on liquid diet still Nausea / Vomiting, if any:  Pain issues, if any: No   Any blood per rectum:   NO  SAFETY ISSUES: NO  Prior radiation? NO  Pacemaker/ICD? YES, Bi-ventricular implantable cardioverter defibrillator has appt with Dr. Lovena Le 08/20/16   Is the patient on methotrexate?  NO  Current Complaints/Details: For``mer smoker, 1.5ppd x 40 years,quit 2004,no smokelss tobacco or alcohol or drug use,  Had ! Unit PRBC this past Saturday, 08/16/16   Mother stroke,HTN,Father prostate cancer, HTN,MI Allergis: NKA BP (!) 124/58 (BP Location: Left Arm, Patient Position: Sitting, Cuff Size: Large)    Pulse (!) 118   Temp 97.8 F (36.6 C) (Oral)   Resp 20   Ht '6\' 2"'$  (1.88 m)   Wt 225 lb 12.8 oz (102.4 kg)   SpO2 97% Comment: room air  BMI 28.99 kg/m   Wt Readings from Last 3 Encounters:  08/18/16 225 lb 12.8 oz (102.4 kg)  08/15/16 230 lb 6.4 oz (104.5 kg)  08/12/16 228 lb 6.4 oz (103.6 kg)

## 2016-08-15 NOTE — Progress Notes (Signed)
[name] OFFICE PROGRESS NOTE   Diagnosis:  Gastric cancer  INTERVAL HISTORY:   [name] returns as scheduled. He was found to have severe progressive anemia on recent labs. He received a blood transfusion 08/12/2016. Upper endoscopy on 08/13/2016 showed a large ulcerated circumferential mass with no bleeding in the gastric antrum and at the pylorus causing partial outlet obstruction. He has been referred to radiation oncology.  He feels better following the blood transfusion but continues to have mild dyspnea. He feels "woozy" at times. He had an episode of black emesis 08/11/2016. No further episodes. He denies pain. He is tolerating a liquid diet.  Objective:  Vital signs in last 24 hours:  Blood pressure (!) 149/52, pulse 84, temperature 98.4 F (36.9 C), temperature source Oral, resp. rate 20, weight 230 lb 6.4 oz (104.5 kg), SpO2 93 %.    Resp: Lungs clear bilaterally. Cardio: Regular rate and rhythm. GI: Abdomen soft and nontender. No hepatomegaly. No mass. Vascular: No leg edema. Neuro: Alert and oriented.  Port-A-Cath without erythema.  Lab Results:  Lab Results  Component Value Date   WBC 6.4 08/15/2016   HGB 8.7 (L) 08/15/2016   HCT 27.0 (L) 08/15/2016   MCV 84.6 08/15/2016   PLT 249 08/15/2016   NEUTROABS 4.7 08/15/2016    Imaging:  No results found.  Medications: I have reviewed the patient's current medications.  Assessment/Plan: 1. Gastric Cancer  Lesser curvature gastric body mass noted on endoscopy 06/27/2015, biopsy confirmed adenocarcinoma  Staging CT scans to 06/28/2015 with a gastric body mass, adenopathy adjacent to the stomach, chest adenopathy, a single enhancing liver lesion, and indeterminate nodular pulmonary lesions  PET scan 07/12/2015-hypermetabolic gastric mass, gastrohepatic ligament metastases, metastasis at the left adrenal gland, hypermetabolic right paratracheal, right hilar, and subcarinal nodes moderate  metabolic activity involving 2 pulmonary lesions  Cycle 1 FOLFOX 07/19/2015  Cycle 2 FOLFOX 08/02/2015  Cycle 3 FOLFOX 08/23/2015 with Neulasta support  Cycle 4 FOLFOX 09/06/2015 with Neulasta support  Cycle 5 FOLFOX 09/20/2015 with Neulasta support  Restaging CTs 10/02/2015-decrease in chest and perigastric lymphadenopathy, decrease thickening at the distal stomach, 11 mm sclerotic lesion in the posterior left third rib-barely visible on the PET/CT 07/12/2015  Cycle 6 FOLFOX 10/04/2015  Cycle 7 FOLFOX 10/18/2015  Cycle 8 FOLFOX 11/08/2015 (oxaliplatin held due to neuropathy)  Cycle 9 FOLFOX 11/22/2015 (oxaliplatin held secondary to neuropathy)  Cycle 10 FOLFOX 12/06/2015 (oxaliplatin held secondary to neuropathy)  CTs 12/24/2015-stable lung nodules and mediastinal lymph nodes, no evidence of disease progression  Upper endoscopy 08/13/2016 showed a large ulcerated circumferential mass with no bleeding in the gastric antrum and at the pylorus causing partial outlet obstruction.  2. COPD  3. Nonischemic cardiomyopathy  4. Implanted cardiac defibrillator  5. History of prostate cancer  6. Hypertension  7. History of Iron deficiency anemia-hemoglobin lower 3 20,018  8. Neutropenia secondary to chemotherapy-Neulasta added with cycle 3 FOLFOX 08/23/2015  9. 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 Anemia secondary to blood loss related to #1. Blood transfusion 08/12/2016.    Disposition: [name] has progressive metastatic gastric cancer. He was recently found to have severe symptomatic anemia. Endoscopy earlier this week confirmed a mass in the gastric antrum and at the pylorus with partial outlet obstruction. These findings were reviewed with [name] and his son at today's visit.  He was transfused 2 units of blood on 08/12/2016. He overall is feeling better but continues to be symptomatic.  He will  receive a unit of blood on 08/16/2016.  He will continue a liquid diet.  He is scheduled to see Dr. Lisbeth Renshaw 08/18/2016. Dr. [name] recommends concurrent infusional 5-fluorouracil with radiation. We reviewed potential toxicities associated with 5-fluorouracil including nausea, mouth sores, skin hyperpigmentation, skin rash, hand-foot syndrome, conjunctivitis, diarrhea. [name] is in agreement with this plan. I spoke to Dr. Lisbeth Renshaw. Radiation is anticipated to begin on 08/21/2016. We will schedule him to begin the 5-fluorouracil pump the same day.  [name] return for repeat labs and a follow-up visit on 08/19/2016. He understands to seek evaluation in the emergency department with bleeding.  Dr. [name] and I discussed the plan of care prior to today's visit.  25 minutes were spent face-to-face at today's visit with the majority of that time involved in counseling/coordination of care.  Ned Card ANP/GNP-BC   08/15/2016  12:37 PM

## 2016-08-16 ENCOUNTER — Ambulatory Visit: Payer: Medicare HMO

## 2016-08-16 DIAGNOSIS — C162 Malignant neoplasm of body of stomach: Secondary | ICD-10-CM | POA: Diagnosis not present

## 2016-08-16 DIAGNOSIS — C169 Malignant neoplasm of stomach, unspecified: Secondary | ICD-10-CM

## 2016-08-16 LAB — PREPARE RBC (CROSSMATCH)

## 2016-08-16 MED ORDER — HEPARIN SOD (PORK) LOCK FLUSH 100 UNIT/ML IV SOLN
500.0000 [IU] | Freq: Every day | INTRAVENOUS | Status: AC | PRN
  Administered 2016-08-16: 500 [IU]
  Filled 2016-08-16: qty 5

## 2016-08-16 MED ORDER — SODIUM CHLORIDE 0.9% FLUSH
10.0000 mL | INTRAVENOUS | Status: AC | PRN
  Administered 2016-08-16: 10 mL
  Filled 2016-08-16: qty 10

## 2016-08-16 MED ORDER — SODIUM CHLORIDE 0.9 % IV SOLN
250.0000 mL | Freq: Once | INTRAVENOUS | Status: AC
  Administered 2016-08-16: 250 mL via INTRAVENOUS

## 2016-08-16 NOTE — Patient Instructions (Signed)
Blood Transfusion , Adult A blood transfusion is a procedure in which you receive donated blood, including plasma, platelets, and red blood cells, through an IV tube. You may need a blood transfusion because of illness, surgery, or injury. The blood may come from a donor. You may also be able to donate blood for yourself (autologous blood donation) before a surgery if you know that you might require a blood transfusion. The blood given in a transfusion is made up of different types of cells. You may receive:  Red blood cells. These carry oxygen to the cells in the body.  White blood cells. These help you fight infections.  Platelets. These help your blood to clot.  Plasma. This is the liquid part of your blood and it helps with fluid imbalances. If you have hemophilia or another clotting disorder, you may also receive other types of blood products. Tell a health care provider about:  Any allergies you have.  All medicines you are taking, including vitamins, herbs, eye drops, creams, and over-the-counter medicines.  Any problems you or family members have had with anesthetic medicines.  Any blood disorders you have.  Any surgeries you have had.  Any medical conditions you have, including any recent fever or cold symptoms.  Whether you are pregnant or may be pregnant.  Any previous reactions you have had during a blood transfusion. What are the risks? Generally, this is a safe procedure. However, problems may occur, including:  Having an allergic reaction to something in the donated blood. Hives and itching may be symptoms of this type of reaction.  Fever. This may be a reaction to the white blood cells in the transfused blood. Nausea or chest pain may accompany a fever.  Iron overload. This can happen from having many transfusions.  Transfusion-related acute lung injury (TRALI). This is a rare reaction that causes lung damage. The cause is not known.TRALI can occur within hours  of a transfusion or several days later.  Sudden (acute) or delayed hemolytic reactions. This happens if your blood does not match the cells in your transfusion. Your body's defense system (immune system) may try to attack the new cells. This complication is rare. The symptoms include fever, chills, nausea, and low back pain or chest pain.  Infection or disease transmission. This is rare. What happens before the procedure?  You will have a blood test to determine your blood type. This is necessary to know what kind of blood your body will accept and to match it to the donor blood.  If you are going to have a planned surgery, you may be able to do an autologous blood donation. This may be done in case you need to have a transfusion.  If you have had an allergic reaction to a transfusion in the past, you may be given medicine to help prevent a reaction. This medicine may be given to you by mouth or through an IV tube.  You will have your temperature, blood pressure, and pulse monitored before the transfusion.  Follow instructions from your health care provider about eating and drinking restrictions.  Ask your health care provider about:  Changing or stopping your regular medicines. This is especially important if you are taking diabetes medicines or blood thinners.  Taking medicines such as aspirin and ibuprofen. These medicines can thin your blood. Do not take these medicines before your procedure if your health care provider instructs you not to. What happens during the procedure?  An IV tube will be   inserted into one of your veins.  The bag of donated blood will be attached to your IV tube. The blood will then enter through your vein.  Your temperature, blood pressure, and pulse will be monitored regularly during the transfusion. This monitoring is done to detect early signs of a transfusion reaction.  If you have any signs or symptoms of a reaction, your transfusion will be stopped and  you may be given medicine.  When the transfusion is complete, your IV tube will be removed.  Pressure may be applied to the IV site for a few minutes.  A bandage (dressing) will be applied. The procedure may vary among health care providers and hospitals. What happens after the procedure?  Your temperature, blood pressure, heart rate, breathing rate, and blood oxygen level will be monitored often.  Your blood may be tested to see how you are responding to the transfusion.  You may be warmed with fluids or blankets to maintain a normal body temperature. Summary  A blood transfusion is a procedure in which you receive donated blood, including plasma, platelets, and red blood cells, through an IV tube.  Your temperature, blood pressure, and pulse will be monitored before, during, and after the transfusion.  Your blood may be tested after the transfusion to see how your body has responded. This information is not intended to replace advice given to you by your health care provider. Make sure you discuss any questions you have with your health care provider. Document Released: 04/18/2000 Document Revised: 01/17/2016 Document Reviewed: 01/17/2016 Elsevier Interactive Patient Education  2017 [employer]  

## 2016-08-18 ENCOUNTER — Ambulatory Visit
Admission: RE | Admit: 2016-08-18 | Discharge: 2016-08-18 | Disposition: A | Discharge status: Home / Self Care | Payer: Medicare HMO | Source: Ambulatory Visit | Attending: Radiation Oncology | Admitting: Radiation Oncology

## 2016-08-18 ENCOUNTER — Encounter: Payer: Self-pay | Admitting: Radiation Oncology

## 2016-08-18 DIAGNOSIS — Z7951 Long term (current) use of inhaled steroids: Secondary | ICD-10-CM | POA: Diagnosis not present

## 2016-08-18 DIAGNOSIS — Z510 Encounter for antineoplastic radiation therapy: Secondary | ICD-10-CM | POA: Diagnosis not present

## 2016-08-18 DIAGNOSIS — Z86010 Personal history of colonic polyps: Secondary | ICD-10-CM | POA: Diagnosis not present

## 2016-08-18 DIAGNOSIS — Z8042 Family history of malignant neoplasm of prostate: Secondary | ICD-10-CM | POA: Insufficient documentation

## 2016-08-18 DIAGNOSIS — Z8546 Personal history of malignant neoplasm of prostate: Secondary | ICD-10-CM | POA: Insufficient documentation

## 2016-08-18 DIAGNOSIS — R918 Other nonspecific abnormal finding of lung field: Secondary | ICD-10-CM | POA: Diagnosis not present

## 2016-08-18 DIAGNOSIS — C649 Malignant neoplasm of unspecified kidney, except renal pelvis: Secondary | ICD-10-CM | POA: Diagnosis not present

## 2016-08-18 DIAGNOSIS — Z95810 Presence of automatic (implantable) cardiac defibrillator: Secondary | ICD-10-CM | POA: Insufficient documentation

## 2016-08-18 DIAGNOSIS — E785 Hyperlipidemia, unspecified: Secondary | ICD-10-CM | POA: Insufficient documentation

## 2016-08-18 DIAGNOSIS — I5022 Chronic systolic (congestive) heart failure: Secondary | ICD-10-CM | POA: Insufficient documentation

## 2016-08-18 DIAGNOSIS — M1990 Unspecified osteoarthritis, unspecified site: Secondary | ICD-10-CM | POA: Insufficient documentation

## 2016-08-18 DIAGNOSIS — Z823 Family history of stroke: Secondary | ICD-10-CM | POA: Diagnosis not present

## 2016-08-18 DIAGNOSIS — D649 Anemia, unspecified: Secondary | ICD-10-CM | POA: Insufficient documentation

## 2016-08-18 DIAGNOSIS — Z8249 Family history of ischemic heart disease and other diseases of the circulatory system: Secondary | ICD-10-CM | POA: Insufficient documentation

## 2016-08-18 DIAGNOSIS — I428 Other cardiomyopathies: Secondary | ICD-10-CM | POA: Diagnosis not present

## 2016-08-18 DIAGNOSIS — C169 Malignant neoplasm of stomach, unspecified: Secondary | ICD-10-CM

## 2016-08-18 DIAGNOSIS — Z87891 Personal history of nicotine dependence: Secondary | ICD-10-CM | POA: Diagnosis not present

## 2016-08-18 DIAGNOSIS — J449 Chronic obstructive pulmonary disease, unspecified: Secondary | ICD-10-CM | POA: Diagnosis not present

## 2016-08-18 DIAGNOSIS — C160 Malignant neoplasm of cardia: Secondary | ICD-10-CM | POA: Insufficient documentation

## 2016-08-18 DIAGNOSIS — I110 Hypertensive heart disease with heart failure: Secondary | ICD-10-CM | POA: Insufficient documentation

## 2016-08-18 DIAGNOSIS — R590 Localized enlarged lymph nodes: Secondary | ICD-10-CM | POA: Diagnosis not present

## 2016-08-18 DIAGNOSIS — Z9221 Personal history of antineoplastic chemotherapy: Secondary | ICD-10-CM | POA: Diagnosis not present

## 2016-08-18 DIAGNOSIS — Z79899 Other long term (current) drug therapy: Secondary | ICD-10-CM | POA: Diagnosis not present

## 2016-08-18 LAB — BPAM RBC
BLOOD PRODUCT EXPIRATION DATE: 201804302359
ISSUE DATE / TIME: 201804141006
Unit Type and Rh: 6200

## 2016-08-18 LAB — TYPE AND SCREEN
ABO/RH(D): A POS
Antibody Screen: NEGATIVE
UNIT DIVISION: 0

## 2016-08-18 NOTE — Telephone Encounter (Signed)
Pt can be seen Thursday 08-21-16 at 4:30 pm slot. Thanks.

## 2016-08-18 NOTE — Progress Notes (Signed)
Radiation Oncology         [phone] ________________________________  Name: [name] MRN: [id number]  Date: 08/18/2016  DOB: 1935/03/17  CC:Joseph Calico, MD  Joseph Pier, MD     REFERRING PHYSICIAN: Ladell Pier, MD   DIAGNOSIS: Diagnoses of Malignant neoplasm of stomach, unspecified location [facility]) and Adenocarcinoma of gastric cardia [facility]) were pertinent to this visit.  HISTORY OF PRESENT ILLNESS: [name] is a 82 y.o. male seen at the request of [name].  [name] was found to have iron deficiency anemia when he saw [name] for a physical in early 2017. The hemoglobin returned at 6.5 with an MCV of 53. He was subsequently referred to [name] and was transfused 2 units of packed red blood cells and started on iron. He was taken to an upper endoscopy on 06/27/2015. A large gastric mass was noted extending from the lesser curvature of the distal gastric body. The mass was ulcerated and friable. Biopsies were obtained. The duodenum appeared normal. The pathology ([id number]) revealed adenocarcinoma.  Staging CT scans on 06/28/2015 showed a gastric body mass, adenopathy adjacent to the stomach, chest adenopathy, a single enhancing liver lesion, and indeterminate nodular pulmonary lesions. PET scan on 07/12/2015 showed a hypermetabolic gastric mass, gastrohepatic ligament metastases, metastasis of the left adrenal gland, hypermetabolic right paratracheal, right hilar, and subcarinal nodes, and moderate metabolic activity involving 2 pulmonary lesions.  The patient presented to [name] on 07/06/2015 who subsequently started the patient with systemic therapy; FOLFOX and later with Neulasta support. The patient had 10 cycles of chemotherapy from 07/19/15 - 12/06/15. Oxaliplatin was held for the last 3 cycles due to neuropathy. Restaging CT on 12/24/15 showed stable lung nodule and mediastinal lymph nodes with no evidence of disease progression. Also noted  was stable appearance of irregular wall thickening of the distal stomach.  In early April 2018, the patient had worsening anemia with chronically dark/black stools. He received 2 units PRBCs on 08/12/16 and felt better. The patient had a repeat upper endoscopy on 08/13/2016. Seen were a diverticulum with a large opening in the lower third of the esophagus and a large, ulcerated, circumferentiall mass with no bleeding in the gastric antrum and at the pylorus causing partial outlet obstruction. The endoscope could not transverse the mass. The patient had another unit of PRBC on 08/16/16. [name] recommended concurrent infusional 5-fluorouracil with radiation to the mass.  The patient, his wife, and son present today to discuss radiation to the gastric mass.  PREVIOUS RADIATION THERAPY: No   PAST MEDICAL HISTORY:  Past Medical History:  Diagnosis Date  . AICD (automatic cardioverter/defibrillator) present   . Anemia   . Arthritis       . Asthma   . Chronic systolic heart failure ([place])   . Colon polyps   . COPD (chronic obstructive pulmonary disease) ([facility])    FEV1 2.68/72%, FEV1/FVC 0.63 (08/22/04  . History of blood transfusion   . HTN (hypertension)   . Hyperlipidemia   . LBBB (left bundle branch block)    [name] 06/20/2014  . Malignant neoplasm pyloric antrum ([place])   . Non-ischemic cardiomyopathy ([place])    a. s/p STJ CRTD 07/2014  . Pneumonia   . Primary cancer of stomach with metastasis to other site ([place]) 07/12/2015  . Prostate cancer ([place]) 2010  . Shortness of breath dyspnea        PAST SURGICAL HISTORY: Past Surgical History:  Procedure Laterality Date  .  BI-VENTRICULAR IMPLANTABLE CARDIOVERTER DEFIBRILLATOR N/A 07/17/2014   STJ CRTD implanted by Dr [name]  . CATARACT EXTRACTION W/ INTRAOCULAR LENS  IMPLANT, BILATERAL Bilateral 2010  . COLONOSCOPY    . ESOPHAGOGASTRODUODENOSCOPY (EGD) WITH PROPOFOL N/A 06/27/2015   Procedure: ESOPHAGOGASTRODUODENOSCOPY (EGD) WITH PROPOFOL;   Surgeon: Joseph Gunning, MD;  Location: [place];  Service: Gastroenterology;  Laterality: N/A;  . ESOPHAGOGASTRODUODENOSCOPY (EGD) WITH PROPOFOL N/A 08/13/2016   Procedure: ESOPHAGOGASTRODUODENOSCOPY (EGD) WITH PROPOFOL;  Surgeon: Joseph Gunning, MD;  Location: [facility];  Service: Gastroenterology;  Laterality: N/A;  . LEFT HEART CATHETERIZATION WITH CORONARY ANGIOGRAM N/A 01/04/2014   no obstructive CAD  . PORTACATH PLACEMENT N/A 07/11/2015   Procedure: INSERTION PORT-A-CATH WITH ULTRA SOUND GUIDANCE;  Surgeon: Joseph Klein, MD;  Location: [facility];  Service: General;  Laterality: N/A;  . PROSTATECTOMY    . ROBOT ASSISTED LAPAROSCOPIC RADICAL PROSTATECTOMY  2010     FAMILY HISTORY:  Family History  Problem Relation Age of Onset  . Arthritis Other   . Esophagitis Mother     STRICTURE  . Stroke Mother   . Hypertension Mother   . Prostate cancer Father   . Hypertension Father   . Heart attack Neg Hx      SOCIAL HISTORY:  reports that he quit smoking about 14 years ago. His smoking use included Cigarettes. He has a 60.00 pack-year smoking history. He has never used smokeless tobacco. He reports that he does not drink alcohol or use drugs.   ALLERGIES: Patient has no known allergies.   MEDICATIONS:  Current Outpatient Prescriptions  Medication Sig Dispense Refill  . acetaminophen (TYLENOL) 500 MG tablet Take 1,000 mg by mouth daily as needed for fever or headache.    Marland Kitchen amLODipine (NORVASC) 10 MG tablet TAKE 1 TABLET(10 MG) BY MOUTH DAILY 90 tablet 0  . atorvastatin (LIPITOR) 40 MG tablet TAKE 1 TABLET BY MOUTH DAILY 90 tablet 3  . budesonide-formoterol (SYMBICORT) 160-4.5 MCG/ACT inhaler Inhale 2 puffs then rinse mouth, twice daily 1 Inhaler 12  . carvedilol (COREG) 6.25 MG tablet Take 1 tablet (6.25 mg total) by mouth 2 (two) times daily with a meal. 60 tablet 5  . Fe Cbn-Fe Gluc-FA-B12-C-DSS (FERRALET 90) 90-1 MG TABS Take 1 tablet by mouth daily.  11  .  HYDROcodone-homatropine (HYCODAN) 5-1.5 MG/5ML syrup Take 5 mLs by mouth every 6 (six) hours as needed for cough. 240 mL 0  . lidocaine-prilocaine (EMLA) cream Apply 1 application topically as needed. Apply to [facility] 1 hour prior to stick and cover w/plastic wrap 30 g 6  . omeprazole (PRILOSEC) 40 MG capsule Take 1 capsule (40 mg total) by mouth daily. 90 capsule 3  . Polyethyl Glycol-Propyl Glycol (SYSTANE OP) Place 1 drop into both eyes daily.     . potassium chloride SA (K-DUR,KLOR-CON) 20 MEQ tablet Take 1 tablet (20 mEq total) by mouth daily. 90 tablet 1  . valsartan (DIOVAN) 320 MG tablet TAKE 1 TABLET(320 MG) BY MOUTH DAILY 90 tablet 0   No current facility-administered medications for this encounter.    Facility-Administered Medications Ordered in Other Encounters  Medication Dose Route Frequency Provider Last Rate Last Dose  . alteplase (CATHFLO ACTIVASE) injection 2 mg  2 mg Intracatheter Once PRN Joseph Pier, MD      . heparin lock flush 100 unit/mL  250 Units Intracatheter Once PRN Joseph Pier, MD         REVIEW OF SYSTEMS: On review of systems, the patient reports that  he is doing well overall. He denies any chest pain, cough, fevers, chills, night sweats, unintended weight changes. He denies any bowel or bladder disturbances, and denies abdominal pain, or nausea. He denies any new musculoskeletal or joint aches or pains. He reports mild dyspnea. He denies dysphagia and he is on a liquid diet. He feels "woozy" at times. He reports an episode of black emesis on 08/11/16. A complete review of systems is obtained and is otherwise negative.     PHYSICAL EXAM:  Wt Readings from Last 3 Encounters:  08/18/16 225 lb 12.8 oz (102.4 kg)  08/15/16 230 lb 6.4 oz (104.5 kg)  08/12/16 228 lb 6.4 oz (103.6 kg)   Temp Readings from Last 3 Encounters:  08/18/16 97.8 F (36.6 C) (Oral)  08/16/16 98 F (36.7 C) (Oral)  08/15/16 98.4 F (36.9 C) (Oral)   BP Readings from Last 3  Encounters:  08/18/16 (!) 124/58  08/16/16 129/67  08/15/16 (!) 149/52   Pulse Readings from Last 3 Encounters:  08/18/16 (!) 118  08/16/16 70  08/15/16 84   Pain Assessment Pain Score: 0-No pain/10  In general this is a well appearing elderly African-American male in no acute distress. He is alert and oriented x4 and appropriate throughout the examination. HEENT reveals that the patient is normocephalic, atraumatic. EOMs are intact. PERRLA. Skin is intact without any evidence of gross lesions. Cardiovascular exam reveals a regular rate and rhythm, no clicks rubs or murmurs are auscultated. Chest is clear to auscultation bilaterally. Lymphatic assessment is performed and does not reveal any adenopathy in the cervical, supraclavicular, axillary, or inguinal chains. Abdomen has active bowel sounds in all quadrants and is intact. The abdomen is soft, non tender, non distended. Lower extremities are negative for pretibial pitting edema, deep calf tenderness, cyanosis or clubbing.   ECOG = 1  0 - Asymptomatic (Fully active, able to carry on all predisease activities without restriction)  1 - Symptomatic but completely ambulatory (Restricted in physically strenuous activity but ambulatory and able to carry out work of a light or sedentary nature. For example, light housework, office work)  2 - Symptomatic, <50% in bed during the day (Ambulatory and capable of all self care but unable to carry out any work activities. Up and about more than 50% of waking hours)  3 - Symptomatic, >50% in bed, but not bedbound (Capable of only limited self-care, confined to bed or chair 50% or more of waking hours)  4 - Bedbound (Completely disabled. Cannot carry on any self-care. Totally confined to bed or chair)  5 - Death   Eustace Pen MM, Creech RH, Tormey DC, et al. [phone]). "Toxicity and response criteria of the [facility] Group". [place] Oncol. 5 (6): 649-55    LABORATORY DATA:  Lab  Results  Component Value Date   WBC 6.4 08/15/2016   HGB 8.7 (L) 08/15/2016   HCT 27.0 (L) 08/15/2016   MCV 84.6 08/15/2016   PLT 249 08/15/2016   Lab Results  Component Value Date   NA 138 07/22/2016   K 4.3 07/22/2016   CL 105 02/18/2016   CO2 23 07/22/2016   Lab Results  Component Value Date   ALT 13 02/18/2016   AST 21 02/18/2016   ALKPHOS 121 (H) 02/18/2016   BILITOT 0.4 02/18/2016      RADIOGRAPHY: Dg Chest 2 View  Result Date: 08/12/2016 CLINICAL DATA:  82 year old male with increasing shortness of breath after recent chemotherapy for gastric cancer. EXAM:  CHEST  2 VIEW COMPARISON:  Restaging chest CT 12/24/2015 and earlier. FINDINGS: Right chest porta cath and left chest cardiac pacemaker appear stable. Mediastinal contours are stable and within normal limits. Visualized tracheal air column is within normal limits. The lungs are clear. No pneumothorax or pleural effusion. Chronic left lateral seventh and eighth rib fractures. No acute osseous abnormality identified. Negative visible bowel gas pattern. IMPRESSION: No acute cardiopulmonary abnormality. Electronically Signed   By: Genevie Ann M.D.   On: 08/12/2016 12:31       IMPRESSION/PLAN: 1. Metastatic adenocarcinoma of the stomach  The patient had a good response to chemotherapy with stable pelvic adenopathy and pulmonary nodules. However, the large gastric mass remains. He is symptomatic with severe anemia that requires blood transfusions. [name] recommended concurrent infusional 5-fluorouracil with radiation directed to the mass. The intent of radiation is palliative.  The patient appears to be an appropriate candidate for a course of chemoradiotherapy. This would consist of 3 weeks of daily radiation treatments and this would be given concurrently with chemotherapy. The patient therefore we'll need to proceed with simulation such that we can proceed with treatment planning. The target for radiation would consist of the  tumor. We discussed the possible side effects and risks of treatment including such as fatigue, nausea, decreased appetite, loose stools/diarrhea, skin irritation of the local area, or damage to nearby normal structures. The patient signed a consent form and a copy was placed in his medical chart. The patient is scheduled to follow up with Ned Card, NP, Dr. Gearldine Shown Nurse Practitioner tomorrow. We will schedule the patient for CT simulation Wednesday afternoon, 08/20/16, and coordinate with Med/Onc the beginning of his radiation treatment on 08/21/2016..  2. Anemia  The gastric mass is causing worsening anemia. His blood counts will be followed by Med/Onc. He required blood transfusions in the past and may need more in the near future. Hopefully chemoradiation will solve this problem.  ------------------------------------------------  Jodelle Gross, MD, PhD  This document serves as a record of services personally performed by Kyung Rudd, MD. It was created on his behalf by Darcus Austin, a trained medical scribe. The creation of this record is based on the scribe's personal observations and the provider's statements to them. This document has been checked and approved by the attending provider.

## 2016-08-18 NOTE — Progress Notes (Signed)
Please see the Nurse Progress Note in the MD Initial Consult Encounter for this patient. 

## 2016-08-18 NOTE — Telephone Encounter (Signed)
Katie- I don't know how quickly we can see him back, at least for resting and walk test for O2 assessment. We need to be able to say he is at baseline to qualify- not wheezing badly.

## 2016-08-19 ENCOUNTER — Telehealth: Payer: Self-pay | Admitting: Nurse Practitioner

## 2016-08-19 ENCOUNTER — Other Ambulatory Visit (HOSPITAL_BASED_OUTPATIENT_CLINIC_OR_DEPARTMENT_OTHER): Payer: Medicare HMO

## 2016-08-19 ENCOUNTER — Ambulatory Visit (HOSPITAL_BASED_OUTPATIENT_CLINIC_OR_DEPARTMENT_OTHER): Payer: Medicare HMO | Admitting: Nurse Practitioner

## 2016-08-19 VITALS — BP 119/61 | HR 66 | Temp 97.8°F | Resp 18 | Ht 74.0 in | Wt 225.6 lb

## 2016-08-19 DIAGNOSIS — C169 Malignant neoplasm of stomach, unspecified: Secondary | ICD-10-CM

## 2016-08-19 DIAGNOSIS — D500 Iron deficiency anemia secondary to blood loss (chronic): Secondary | ICD-10-CM

## 2016-08-19 DIAGNOSIS — Z7189 Other specified counseling: Secondary | ICD-10-CM

## 2016-08-19 DIAGNOSIS — G620 Drug-induced polyneuropathy: Secondary | ICD-10-CM | POA: Diagnosis not present

## 2016-08-19 DIAGNOSIS — C162 Malignant neoplasm of body of stomach: Secondary | ICD-10-CM

## 2016-08-19 DIAGNOSIS — C160 Malignant neoplasm of cardia: Secondary | ICD-10-CM

## 2016-08-19 DIAGNOSIS — C7972 Secondary malignant neoplasm of left adrenal gland: Secondary | ICD-10-CM

## 2016-08-19 DIAGNOSIS — Z8546 Personal history of malignant neoplasm of prostate: Secondary | ICD-10-CM

## 2016-08-19 DIAGNOSIS — D509 Iron deficiency anemia, unspecified: Secondary | ICD-10-CM

## 2016-08-19 LAB — CBC WITH DIFFERENTIAL/PLATELET
BASO%: 0.8 % (ref 0.0–2.0)
Basophils Absolute: 0 10*3/uL (ref 0.0–0.1)
EOS ABS: 0.1 10*3/uL (ref 0.0–0.5)
EOS%: 1.7 % (ref 0.0–7.0)
HEMATOCRIT: 33.1 % — AB (ref 38.4–49.9)
HGB: 10.6 g/dL — ABNORMAL LOW (ref 13.0–17.1)
LYMPH#: 0.8 10*3/uL — AB (ref 0.9–3.3)
LYMPH%: 11.6 % — ABNORMAL LOW (ref 14.0–49.0)
MCH: 27 pg — ABNORMAL LOW (ref 27.2–33.4)
MCHC: 32.1 g/dL (ref 32.0–36.0)
MCV: 84.3 fL (ref 79.3–98.0)
MONO#: 0.6 10*3/uL (ref 0.1–0.9)
MONO%: 9 % (ref 0.0–14.0)
NEUT%: 76.9 % — AB (ref 39.0–75.0)
NEUTROS ABS: 5.1 10*3/uL (ref 1.5–6.5)
PLATELETS: 277 10*3/uL (ref 140–400)
RBC: 3.93 10*6/uL — ABNORMAL LOW (ref 4.20–5.82)
RDW: 17.3 % — ABNORMAL HIGH (ref 11.0–14.6)
WBC: 6.6 10*3/uL (ref 4.0–10.3)

## 2016-08-19 LAB — COMPREHENSIVE METABOLIC PANEL
ALT: 10 U/L (ref 0–55)
AST: 17 U/L (ref 5–34)
Albumin: 3.4 g/dL — ABNORMAL LOW (ref 3.5–5.0)
Alkaline Phosphatase: 120 U/L (ref 40–150)
Anion Gap: 8 mEq/L (ref 3–11)
BUN: 10.3 mg/dL (ref 7.0–26.0)
CHLORIDE: 104 meq/L (ref 98–109)
CO2: 25 mEq/L (ref 22–29)
Calcium: 9.3 mg/dL (ref 8.4–10.4)
Creatinine: 1.1 mg/dL (ref 0.7–1.3)
EGFR: 76 mL/min/{1.73_m2} — ABNORMAL LOW (ref >90)
GLUCOSE: 77 mg/dL (ref 70–140)
POTASSIUM: 5.1 meq/L (ref 3.5–5.1)
SODIUM: 137 meq/L (ref 136–145)
Total Bilirubin: 0.43 mg/dL (ref 0.20–1.20)
Total Protein: 6.3 g/dL — ABNORMAL LOW (ref 6.4–8.3)

## 2016-08-19 LAB — FERRITIN: Ferritin: 49 ng/ml (ref 22–316)

## 2016-08-19 LAB — IRON AND TIBC
%SAT: 22 % (ref 20–55)
Iron: 64 ug/dL (ref 42–163)
TIBC: 290 ug/dL (ref 202–409)
UIBC: 227 ug/dL (ref 117–376)

## 2016-08-19 MED ORDER — PROCHLORPERAZINE MALEATE 10 MG PO TABS: 10.0000 mg | ORAL_TABLET | Freq: Four times a day (QID) | ORAL | 0 refills | Status: DC | PRN

## 2016-08-19 NOTE — Telephone Encounter (Signed)
4.19.18  AND 4.26.18 appointments scheduled per 4.13.18 LOS for 5-FU Pump. No care plan at time of scheduling. Information given by desk nurse that patient was in infusion during scheduling and needed appointments to be scheduled. Appointments added per ok - Audie Clear.

## 2016-08-19 NOTE — Patient Instructions (Signed)
Full Liquid Diet A full liquid diet may be used:  To help you transition from a clear liquid diet to a soft diet.  When your body is healing and can only tolerate foods that are easy to digest.  Before or after certain a procedure, test, or surgery (such as stomach or intestinal surgeries).  If you have trouble swallowing or chewing. A full liquid diet includes fluids and foods that are liquid or will become liquid at room temperature. The full liquid diet gives you the proteins, fluids, salts, and minerals that you need for energy. If you continue this diet for more than 72 hours, talk to your health care provider about how many calories you need to consume. If you continue the diet for more than 5 days, talk to your health care provider about taking a multivitamin or a nutritional supplement. What do I need to know about a full liquid diet?  You may have any liquid.  You may have any food that becomes a liquid at room temperature. The food is considered a liquid if it can be poured off a spoon at room temperature.  Drink one serving of citrus or vitamin C-enriched fruit juice daily. What foods can I eat? Grains  Any grain food that can be pureed in soup (such as crackers, pasta, and rice). Hot cereal (such as farina or oatmeal) that has been blended. Talk to your health care provider or dietitian about these foods. Vegetables  Pulp-free tomato or vegetable juice. Vegetables pureed in soup. Fruits  Fruit juice, including nectars and juices with pulp. Meats and Other Protein Sources  Eggs in custard, eggnog mix, and eggs used in ice cream or pudding. Strained meats, like in baby food, may be allowed. Consult your health care provider. Dairy  Milk and milk-based beverages, including milk shakes and instant breakfast mixes. Smooth yogurt. Pureed cottage cheese. Avoid these foods if they are not well tolerated. Beverages  All beverages, including liquid nutritional supplements. Ask your  health care provider if you can have carbonated beverages. They may not be well tolerated. Condiments  Iodized salt, pepper, spices, and flavorings. Cocoa powder. Vinegar, ketchup, yellow mustard, smooth sauces (such as hollandaise, cheese sauce, or white sauce), and soy sauce. Sweets and Desserts  Custard, smooth pudding. Flavored gelatin. Tapioca, junket. Plain ice cream, sherbet, fruit ices. Frozen ice pops, frozen fudge pops, pudding pops, and other frozen bars with cream. Syrups, including chocolate syrup. Sugar, honey, jelly. Fats and Oils  Margarine, butter, cream, sour cream, and oils. Other  Broth and cream soups. Strained, broth-based soups. The items listed above may not be a complete list of recommended foods or beverages. Contact your dietitian for more options.  What foods can I not eat? Grains  All breads. Grains are not allowed unless they are pureed into soup. Vegetables  Vegetables are not allowed unless they are juiced, or cooked and pureed into soup. Fruits  Fruits are not allowed unless they are juiced. Meats and Other Protein Sources  Any meat or fish. Cooked or raw eggs. Nut butters. Dairy  Cheese. Condiments  Stone ground mustards. Fats and Oils  Fats that are coarse or chunky. Sweets and Desserts  Ice cream or other frozen desserts that have any solids in them or on top, such as nuts, chocolate chips, and pieces of cookies. Cakes. Cookies. Candy. Others  Soups with chunks or pieces in them. The items listed above may not be a complete list of foods and beverages to avoid.  Contact your dietitian for more information.  This information is not intended to replace advice given to you by your health care provider. Make sure you discuss any questions you have with your health care provider. Document Released: 04/21/2005 Document Revised: 09/27/2015 Document Reviewed: 02/24/2013 Elsevier Interactive Patient Education  2017 [employer].

## 2016-08-19 NOTE — Progress Notes (Addendum)
[facility] OFFICE PROGRESS NOTE   Diagnosis:  Gastric cancer  INTERVAL HISTORY:   Mr. Briski returns as scheduled. He received a unit of blood 08/16/2016. He denies any bleeding. No further vomiting. Stools are "lighter". He is tolerating a liquid diet. He has mild dyspnea on exertion. No shortness of breath at rest. He recently noted the skin surrounding the Port-A-Cath site is darker.  Objective:  Vital signs in last 24 hours:  Blood pressure 119/61, pulse 66, temperature 97.8 F (36.6 C), temperature source Oral, resp. rate 18, height '6\' 2"'$  (1.88 m), weight 225 lb 9.6 oz (102.3 kg), SpO2 98 %.    HEENT: No thrush or ulcers. Resp: Lungs clear bilaterally. Cardio: Regular rate and rhythm. GI: Abdomen soft and nontender. No hepatomegaly. Vascular: No leg edema. Neuro: Alert and oriented.  Skin: Skin surrounding the Port-A-Cath site appears dry, hyperpigmented. No erythema.    Lab Results:  Lab Results  Component Value Date   WBC 6.6 08/19/2016   HGB 10.6 (L) 08/19/2016   HCT 33.1 (L) 08/19/2016   MCV 84.3 08/19/2016   PLT 277 08/19/2016   NEUTROABS 5.1 08/19/2016    Imaging:  No results found.  Medications: I have reviewed the patient's current medications.  Assessment/Plan: 1. Gastric Cancer  Lesser curvature gastric body mass noted on endoscopy 06/27/2015, biopsy confirmed adenocarcinoma  Staging CT scans to 06/28/2015 with a gastric body mass, adenopathy adjacent to the stomach, chest adenopathy, a single enhancing liver lesion, and indeterminate nodular pulmonary lesions  PET scan 07/12/2015-hypermetabolic gastric mass, gastrohepatic ligament metastases, metastasis at the left adrenal gland, hypermetabolic right paratracheal, right hilar, and subcarinal nodes moderate metabolic activity involving 2 pulmonary lesions  Cycle 1 FOLFOX 07/19/2015  Cycle 2 FOLFOX 08/02/2015  Cycle 3 FOLFOX 08/23/2015 with Neulasta support  Cycle 4 FOLFOX  09/06/2015 with Neulasta support  Cycle 5 FOLFOX 09/20/2015 with Neulasta support  Restaging CTs 10/02/2015-decrease in chest and perigastric lymphadenopathy, decrease thickening at the distal stomach, 11 mm sclerotic lesion in the posterior left third rib-barely visible on the PET/CT 07/12/2015  Cycle 6 FOLFOX 10/04/2015  Cycle 7 FOLFOX 10/18/2015  Cycle 8 FOLFOX 11/08/2015 (oxaliplatin held due to neuropathy)  Cycle 9 FOLFOX 11/22/2015 (oxaliplatin held secondary to neuropathy)  Cycle 10 FOLFOX 12/06/2015 (oxaliplatin held secondary to neuropathy)  CTs 12/24/2015-stable lung nodules and mediastinal lymph nodes, no evidence of disease progression  Upper endoscopy 08/13/2016 showed a large ulcerated circumferential mass with no bleeding in the gastric antrum and at the pylorus causing partial outlet obstruction.  2. COPD  3. Nonischemic cardiomyopathy  4. Implanted cardiac defibrillator  5. History of prostate cancer  6. Hypertension  7. History of Iron deficiency anemia-hemoglobin lower 3 20,018  8. Neutropenia secondary to chemotherapy-Neulasta added with cycle 3 FOLFOX 08/23/2015  9. 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 Anemia secondary to blood loss related to #1. Blood transfusion 08/12/2016, 08/16/2016.    Disposition: Mr. Hannen appears stable. Hemoglobin is better. The plan is to proceed with concurrent radiation/5 fluorouracil beginning 08/21/2016. Potential toxicities associated with 5-FU again reviewed at today's visit. He is agreeable with the above plan.  For now he will continue a full liquid diet.  The hyperpigmentation/dryness surrounding the Port-A-Cath site is consistent with a reaction to a previous dressing. We will request a different type of dressing the next time the Port-A-Cath is accessed.  He will return for a CBC and 5-FU pump change on 08/28/2016. We will see  him in follow-up on  09/04/2016. He will contact the office in the interim with any problems.  Patient seen with Dr. [name]. 25 minutes were spent face-to-face at today's visit with the majority of that time involved in counseling/coordination of care.    Ned Card ANP/GNP-BC   08/19/2016  10:47 AM  This was a shared visit with Ned Card. I discussed the endoscopy findings with [name]. He has seen Dr. Lisbeth Renshaw and is scheduled to begin palliative radiation to the stomach on 08/21/2016. I recommend concurrent infusional 5-fluorouracil. We discussed the potential toxicities associated with infusional 5-fluorouracil and he agrees to proceed. He will continue iron therapy. We will monitor the hemoglobin weekly.  We will consider salvage systemic therapy if the gastric tumor does not respond to radiation.  Julieanne Manson, M.D.

## 2016-08-19 NOTE — Telephone Encounter (Signed)
ATC patient, mailbox full.  wcb

## 2016-08-19 NOTE — Telephone Encounter (Signed)
Gave patient AVS and calender per 4/17 los.

## 2016-08-19 NOTE — Progress Notes (Signed)
START OFF PATHWAY REGIMEN - Gastroesophageal   OFF00212:5-Fluorouracil 225 mg/m2/d + XRT:   Duration of XRT:     5-Fluorouracil   **Always confirm dose/schedule in your pharmacy ordering system**    Patient Characteristics: Distant Metastases (cM1/pM1) / Locally Recurrent Disease, Adenocarcinoma - Esophageal, GE Junction, and Gastric, Second Line, MSS / pMMR or MSI Unknown Histology: Adenocarcinoma Disease Classification: Gastric Therapeutic Status: Distant Metastases (No Additional Staging) Would you be surprised if this patient died  in the next year? I would NOT be surprised if this patient died in the next year Line of therapy: Second Line Microsatellite/Mismatch Repair Status: Unknown  Intent of Therapy: Non-Curative / Palliative Intent, Discussed with Patient

## 2016-08-20 ENCOUNTER — Ambulatory Visit
Admission: RE | Admit: 2016-08-20 | Discharge: 2016-08-20 | Disposition: A | Discharge status: Home / Self Care | Payer: Medicare HMO | Source: Ambulatory Visit | Attending: Radiation Oncology | Admitting: Radiation Oncology

## 2016-08-20 ENCOUNTER — Encounter: Payer: Self-pay | Admitting: Internal Medicine

## 2016-08-20 ENCOUNTER — Ambulatory Visit (INDEPENDENT_AMBULATORY_CARE_PROVIDER_SITE_OTHER): Payer: Medicare HMO | Admitting: Internal Medicine

## 2016-08-20 DIAGNOSIS — I110 Hypertensive heart disease with heart failure: Secondary | ICD-10-CM | POA: Diagnosis not present

## 2016-08-20 DIAGNOSIS — C160 Malignant neoplasm of cardia: Secondary | ICD-10-CM | POA: Diagnosis not present

## 2016-08-20 DIAGNOSIS — I5022 Chronic systolic (congestive) heart failure: Secondary | ICD-10-CM

## 2016-08-20 DIAGNOSIS — Z9221 Personal history of antineoplastic chemotherapy: Secondary | ICD-10-CM | POA: Diagnosis not present

## 2016-08-20 DIAGNOSIS — Z510 Encounter for antineoplastic radiation therapy: Secondary | ICD-10-CM | POA: Diagnosis not present

## 2016-08-20 DIAGNOSIS — I428 Other cardiomyopathies: Secondary | ICD-10-CM | POA: Diagnosis not present

## 2016-08-20 DIAGNOSIS — J449 Chronic obstructive pulmonary disease, unspecified: Secondary | ICD-10-CM | POA: Diagnosis not present

## 2016-08-20 DIAGNOSIS — R918 Other nonspecific abnormal finding of lung field: Secondary | ICD-10-CM | POA: Diagnosis not present

## 2016-08-20 DIAGNOSIS — Z95810 Presence of automatic (implantable) cardiac defibrillator: Secondary | ICD-10-CM | POA: Diagnosis not present

## 2016-08-20 DIAGNOSIS — D649 Anemia, unspecified: Secondary | ICD-10-CM | POA: Diagnosis not present

## 2016-08-20 DIAGNOSIS — R590 Localized enlarged lymph nodes: Secondary | ICD-10-CM | POA: Diagnosis not present

## 2016-08-20 LAB — CUP PACEART INCLINIC DEVICE CHECK
Implantable Lead Implant Date: 20160314
Implantable Lead Implant Date: 20160314
Implantable Lead Location: 753859
Implantable Lead Model: 7122
MDC IDC LEAD IMPLANT DT: 20160314
MDC IDC LEAD LOCATION: 753858
MDC IDC LEAD LOCATION: 753860
MDC IDC PG IMPLANT DT: 20160314
MDC IDC PG SERIAL: 7239374
MDC IDC SESS DTM: 20180418110654

## 2016-08-20 NOTE — Patient Instructions (Signed)
Medication Instructions:  Your physician recommends that you continue on your current medications as directed. Please refer to the Current Medication list given to you today.   Labwork: None ordered  Testing/Procedures: None ordered  Follow-Up: Remote monitoring is used to monitor your ICD from home. This monitoring reduces the number of office visits required to check your device to one time per year. It allows [place] to keep an eye on the functioning of your device to ensure it is working properly. You are scheduled for a device check from home on 11/19/16. You may send your transmission at any time that day. If you have a wireless device, the transmission will be sent automatically. After your physician reviews your transmission, you will receive a postcard with your next transmission date.    Your physician wants you to follow-up in: 1 year with Dr. Lovena Le. You will receive a reminder letter in the mail two months in advance. If you don't receive a letter, please call our office to schedule the follow-up appointment.   Any Other Special Instructions Will Be Listed Below (If Applicable).     If you need a refill on your cardiac medications before your next appointment, please call your pharmacy.

## 2016-08-20 NOTE — Telephone Encounter (Signed)
LMTCB with the pt's spouse

## 2016-08-20 NOTE — Progress Notes (Signed)
HPI [name] returns today for ongoing evaluation of chronic systolic heart failure in the setting of LBBB, s/p insertion of a St. Jude Biv ICD. He notes only a minimal improvement in his CHF symptoms. No syncope. He admits to sodium indiscretion. He has been diagnosed with gastric CA and has received Chemo therapy and possible future surgery. His CHF symptoms remain class 2.  No Known Allergies   Current Outpatient Prescriptions  Medication Sig Dispense Refill  . acetaminophen (TYLENOL) 500 MG tablet Take 1,000 mg by mouth daily as needed for fever or headache.    Marland Kitchen amLODipine (NORVASC) 10 MG tablet TAKE 1 TABLET(10 MG) BY MOUTH DAILY 90 tablet 0  . atorvastatin (LIPITOR) 40 MG tablet TAKE 1 TABLET BY MOUTH DAILY 90 tablet 3  . budesonide-formoterol (SYMBICORT) 160-4.5 MCG/ACT inhaler Inhale 2 puffs then rinse mouth, twice daily 1 Inhaler 12  . carvedilol (COREG) 6.25 MG tablet Take 1 tablet (6.25 mg total) by mouth 2 (two) times daily with a meal. 60 tablet 5  . Fe Cbn-Fe Gluc-FA-B12-C-DSS (FERRALET 90) 90-1 MG TABS Take 1 tablet by mouth daily.  11  . HYDROcodone-homatropine (HYCODAN) 5-1.5 MG/5ML syrup Take 5 mLs by mouth every 6 (six) hours as needed for cough. 240 mL 0  . lidocaine-prilocaine (EMLA) cream Apply 1 application topically as needed. Apply to [facility] 1 hour prior to stick and cover w/plastic wrap 30 g 6  . omeprazole (PRILOSEC) 40 MG capsule Take 1 capsule (40 mg total) by mouth daily. 90 capsule 3  . Polyethyl Glycol-Propyl Glycol (SYSTANE OP) Place 1 drop into both eyes daily.     . potassium chloride SA (K-DUR,KLOR-CON) 20 MEQ tablet Take 1 tablet (20 mEq total) by mouth daily. 90 tablet 1  . prochlorperazine (COMPAZINE) 10 MG tablet Take 1 tablet (10 mg total) by mouth every 6 (six) hours as needed for nausea or vomiting. 30 tablet 0  . valsartan (DIOVAN) 320 MG tablet TAKE 1 TABLET(320 MG) BY MOUTH DAILY 90 tablet 0   No current facility-administered medications  for this visit.      Past Medical History:  Diagnosis Date  . AICD (automatic cardioverter/defibrillator) present   . Anemia   . Arthritis       . Asthma   . Chronic systolic heart failure ([place])   . Colon polyps   . COPD (chronic obstructive pulmonary disease) (H[facility])    FEV1 2.68/72%, FEV1/FVC 0.63 (08/22/04  . History of blood transfusion   . HTN (hypertension)   . Hyperlipidemia   . LBBB (left bundle branch block)    [name] 06/20/2014  . 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 Non-ischemic cardiomyopathy ([place])    a. s/p STJ CRTD 07/2014  . Pneumonia   . 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 Prostate cancer ([place]) 2010  . Shortness of breath dyspnea     ROS:   All systems reviewed and negative except as noted in the HPI.   Past Surgical History:  Procedure Laterality Date  . BI-VENTRICULAR IMPLANTABLE CARDIOVERTER DEFIBRILLATOR N/A 07/17/2014   STJ CRTD implanted by Dr Lovena Le  . CATARACT EXTRACTION W/ INTRAOCULAR LENS  IMPLANT, BILATERAL Bilateral 2010  . COLONOSCOPY    . ESOPHAGOGASTRODUODENOSCOPY (EGD) WITH PROPOFOL N/A 06/27/2015   Procedure: ESOPHAGOGASTRODUODENOSCOPY (EGD) WITH PROPOFOL;  Surgeon: Manus Gunning, MD;  Location: [facility];  Service: Gastroenterology;  Laterality: N/A;  . ESOPHAGOGASTRODUODENOSCOPY (EGD) WITH PROPOFOL N/A 08/13/2016  Procedure: ESOPHAGOGASTRODUODENOSCOPY (EGD) WITH PROPOFOL;  Surgeon: Manus Gunning, MD;  Location: [facility];  Service: Gastroenterology;  Laterality: N/A;  . LEFT HEART CATHETERIZATION WITH CORONARY ANGIOGRAM N/A 01/04/2014   no obstructive CAD  . PORTACATH PLACEMENT N/A 07/11/2015   Procedure: INSERTION PORT-A-CATH WITH ULTRA SOUND GUIDANCE;  Surgeon: Stark Klein, MD;  Location: [facility];  Service: General;  Laterality: N/A;  . PROSTATECTOMY    . ROBOT ASSISTED LAPAROSCOPIC RADICAL PROSTATECTOMY  2010     Family History  Problem Relation Age of Onset  .  Arthritis Other   . Esophagitis Mother     STRICTURE  . Stroke Mother   . Hypertension Mother   . Prostate cancer Father   . Hypertension Father   . Heart attack Neg Hx      Social History   Social History  . Marital status: Married    Spouse name: [name]  . Number of children: 2  . Years of education: N/A   Occupational History  . Retired    Social History Main Topics  . Smoking status: Former Smoker    Packs/day: 1.50    Years: 40.00    Types: Cigarettes    Quit date: 05/05/2002  . Smokeless tobacco: Never Used  . Alcohol use No  . Drug use: No  . Sexual activity: Not Currently   Other Topics Concern  . Not on file   Social History Narrative   Regular Exercise -  NO   Married, wife [name]   #2 sons-[name] and [name]        BP - 120/60, P - 85, R - 16 Physical Exam:  Well appearing 82 yo man, NAD HEENT: Unremarkable Neck:  7 cm JVD, no thyromegally Back:  No CVA tenderness Lungs:  Clear with no wheezes, rales or rhonchi HEART:  Regular rate rhythm, no murmurs, no rubs, no clicks, distant heart sounds Abd:  soft, positive bowel sounds, no organomegally, no rebound, no guarding Ext:  2 plus pulses, trace peripheral edema, no cyanosis, no clubbing Skin:  No rashes no nodules Neuro:  CN II through XII intact, motor grossly intact  Device interogation - normal function. See paceart  Assess/Plan: 1. Chronic systolic heart failure - his symptoms remain class 2. No change in meds 2. Gastric CA - note he is being treated with chemo and XRT.  3. PVC's - he has at least 3% PVC burden on device interogation. He is asymptomatic 4. ICD - his St. Jude device is working normally. Will recheck in several months.  Mikle Bosworth.D.

## 2016-08-21 ENCOUNTER — Ambulatory Visit
Admission: RE | Admit: 2016-08-21 | Discharge: 2016-08-21 | Disposition: A | Discharge status: Home / Self Care | Payer: Medicare HMO | Source: Ambulatory Visit | Attending: Radiation Oncology | Admitting: Radiation Oncology

## 2016-08-21 ENCOUNTER — Ambulatory Visit (HOSPITAL_BASED_OUTPATIENT_CLINIC_OR_DEPARTMENT_OTHER): Payer: Medicare HMO

## 2016-08-21 VITALS — BP 107/58 | HR 70 | Temp 97.6°F | Resp 18

## 2016-08-21 DIAGNOSIS — R918 Other nonspecific abnormal finding of lung field: Secondary | ICD-10-CM | POA: Diagnosis not present

## 2016-08-21 DIAGNOSIS — C160 Malignant neoplasm of cardia: Secondary | ICD-10-CM | POA: Diagnosis not present

## 2016-08-21 DIAGNOSIS — Z9221 Personal history of antineoplastic chemotherapy: Secondary | ICD-10-CM | POA: Diagnosis not present

## 2016-08-21 DIAGNOSIS — Z5111 Encounter for antineoplastic chemotherapy: Secondary | ICD-10-CM

## 2016-08-21 DIAGNOSIS — Z95810 Presence of automatic (implantable) cardiac defibrillator: Secondary | ICD-10-CM | POA: Diagnosis not present

## 2016-08-21 DIAGNOSIS — J449 Chronic obstructive pulmonary disease, unspecified: Secondary | ICD-10-CM | POA: Diagnosis not present

## 2016-08-21 DIAGNOSIS — D649 Anemia, unspecified: Secondary | ICD-10-CM | POA: Diagnosis not present

## 2016-08-21 DIAGNOSIS — C162 Malignant neoplasm of body of stomach: Secondary | ICD-10-CM | POA: Diagnosis not present

## 2016-08-21 DIAGNOSIS — C169 Malignant neoplasm of stomach, unspecified: Secondary | ICD-10-CM | POA: Diagnosis not present

## 2016-08-21 DIAGNOSIS — I110 Hypertensive heart disease with heart failure: Secondary | ICD-10-CM | POA: Diagnosis not present

## 2016-08-21 DIAGNOSIS — Z510 Encounter for antineoplastic radiation therapy: Secondary | ICD-10-CM | POA: Diagnosis not present

## 2016-08-21 DIAGNOSIS — R590 Localized enlarged lymph nodes: Secondary | ICD-10-CM | POA: Diagnosis not present

## 2016-08-21 MED ORDER — SODIUM CHLORIDE 0.9 % IV SOLN
225.0000 mg/m2/d | INTRAVENOUS | Status: DC
  Administered 2016-08-21: 3650 mg via INTRAVENOUS
  Filled 2016-08-21: qty 73

## 2016-08-21 MED ORDER — SODIUM CHLORIDE 0.9 % IV SOLN: Freq: Once | INTRAVENOUS | Status: DC

## 2016-08-21 NOTE — Telephone Encounter (Signed)
Spoke with the pt  He can not make it in today, but [facility] has opening tomorrow at 9 am, so he will come in then  He is doing well and not wheezing

## 2016-08-21 NOTE — Patient Instructions (Signed)
[facility] Discharge Instructions for Patients Receiving Chemotherapy  Today you received the following chemotherapy agents: Adrucil   To help prevent nausea and vomiting after your treatment, we encourage you to take your nausea medication as directed.    If you develop nausea and vomiting that is not controlled by your nausea medication, call the clinic.   BELOW ARE SYMPTOMS THAT SHOULD BE REPORTED IMMEDIATELY:  *FEVER GREATER THAN 100.5 F  *CHILLS WITH OR WITHOUT FEVER  NAUSEA AND VOMITING THAT IS NOT CONTROLLED WITH YOUR NAUSEA MEDICATION  *UNUSUAL SHORTNESS OF BREATH  *UNUSUAL BRUISING OR BLEEDING  TENDERNESS IN MOUTH AND THROAT WITH OR WITHOUT PRESENCE OF ULCERS  *URINARY PROBLEMS  *BOWEL PROBLEMS  UNUSUAL RASH Items with * indicate a potential emergency and should be followed up as soon as possible.  Feel free to call the clinic you have any questions or concerns. The clinic phone number is [phone].  Please show the CHEMO ALERT CARD at check-in to the Emergency Department and triage nurse.   

## 2016-08-21 NOTE — Progress Notes (Signed)
1410: pt stable at discharge  Discharge instructions verbalized with pt and patient's wife. Both verbalizes understanding.

## 2016-08-22 ENCOUNTER — Ambulatory Visit
Admission: RE | Admit: 2016-08-22 | Discharge: 2016-08-22 | Disposition: A | Discharge status: Home / Self Care | Payer: Medicare HMO | Source: Ambulatory Visit | Attending: Radiation Oncology | Admitting: Radiation Oncology

## 2016-08-22 ENCOUNTER — Encounter: Payer: Self-pay | Admitting: Internal Medicine

## 2016-08-22 ENCOUNTER — Ambulatory Visit (INDEPENDENT_AMBULATORY_CARE_PROVIDER_SITE_OTHER): Payer: Medicare HMO | Admitting: Internal Medicine

## 2016-08-22 VITALS — BP 136/80 | HR 86 | Ht 74.0 in | Wt 225.4 lb

## 2016-08-22 DIAGNOSIS — C169 Malignant neoplasm of stomach, unspecified: Secondary | ICD-10-CM

## 2016-08-22 DIAGNOSIS — J9621 Acute and chronic respiratory failure with hypoxia: Secondary | ICD-10-CM

## 2016-08-22 DIAGNOSIS — R918 Other nonspecific abnormal finding of lung field: Secondary | ICD-10-CM | POA: Diagnosis not present

## 2016-08-22 DIAGNOSIS — Z95810 Presence of automatic (implantable) cardiac defibrillator: Secondary | ICD-10-CM | POA: Diagnosis not present

## 2016-08-22 DIAGNOSIS — J449 Chronic obstructive pulmonary disease, unspecified: Secondary | ICD-10-CM

## 2016-08-22 DIAGNOSIS — Z510 Encounter for antineoplastic radiation therapy: Secondary | ICD-10-CM | POA: Diagnosis not present

## 2016-08-22 DIAGNOSIS — Z9221 Personal history of antineoplastic chemotherapy: Secondary | ICD-10-CM | POA: Diagnosis not present

## 2016-08-22 DIAGNOSIS — C160 Malignant neoplasm of cardia: Secondary | ICD-10-CM | POA: Diagnosis not present

## 2016-08-22 DIAGNOSIS — R590 Localized enlarged lymph nodes: Secondary | ICD-10-CM | POA: Diagnosis not present

## 2016-08-22 DIAGNOSIS — I110 Hypertensive heart disease with heart failure: Secondary | ICD-10-CM | POA: Diagnosis not present

## 2016-08-22 DIAGNOSIS — D649 Anemia, unspecified: Secondary | ICD-10-CM | POA: Diagnosis not present

## 2016-08-22 MED ORDER — RADIAPLEXRX EX GEL
Freq: Once | CUTANEOUS | Status: AC
  Administered 2016-08-22: 13:00:00 via TOPICAL

## 2016-08-22 NOTE — Assessment & Plan Note (Signed)
Acutely hypoxic during exacerbation when he was also substantially anemic. Now transfused and chest is clear. He no longer qualifies for oxygen.

## 2016-08-22 NOTE — Progress Notes (Signed)
  Radiation Oncology         [phone] ________________________________  Name: [name] MRN: [id number]  Date: 08/20/2016  DOB: [birth year]  SIMULATION AND TREATMENT PLANNING NOTE  DIAGNOSIS:     ICD-9-CM ICD-10-CM   1. 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  Identity was confirmed.  All relevant records and images related to the planned course of therapy were reviewed.   Written consent to proceed with treatment was confirmed which was freely given after reviewing the details related to the planned course of therapy had been reviewed with the patient.  Then, the patient was set-up in a stable reproducible  supine position for radiation therapy.  CT images were obtained.  Surface markings were placed.    Medically necessary complex treatment device(s) for immobilization:  Customized vac lock bag.   The CT images were loaded into the planning software.  Then the target and avoidance structures were contoured.  Treatment planning then occurred.  The radiation prescription was entered and confirmed.  A total of 4 complex treatment devices were fabricated which relate to the designed radiation treatment fields. A reduced field technique to improve dose homogeneity will also be utilized. Each of these customized fields/ complex treatment devices will be used on a daily basis during the radiation course. I have requested : 3D Simulation  I have requested a DVH of the following structures: Target volume, left kidney, right kidney, spinal cord.   The patient will undergo daily image guidance to ensure accurate localization of the target, and adequate minimize dose to the normal surrounding structures in close proximity to the target.   PLAN:  The patient will receive 37.5 Gy in 15 fractions.  Special treatment procedure The patient will also receive concurrent chemotherapy during  the treatment. The patient may therefore experience increased toxicity or side effects and the patient will be monitored for such problems. This may require extra lab work as necessary. This therefore constitutes a special treatment procedure.    ________________________________   Jodelle Gross, MD, PhD

## 2016-08-22 NOTE — Patient Instructions (Addendum)
Glad you are feeling better  Please call if we can help  We stopped the Uniphyll/ theophylline pill at last visit, and we will stay off.

## 2016-08-22 NOTE — Progress Notes (Signed)
HPI male former smoker followed for COPD, lung nodules, complicated by DM 2, CAD/CM/AICD/pacer, anemia/FE, gastric cancer  FEV1 60% 02/07/14          Room Air Oxygen saturation on arrival 08/12/2813-83%-home oxygen started - dc'd with improvement Walk test 08/22/16-didn't get below 90% saturation         ----------------------------------------------------------------------------------------------------- 08/12/2016-82 year old male former smoker followed for COPD, complicated by DM 2, CAD/CM/AICD/pacer, anemia/FE, gastric cancer ACUTE VISIT: Pt states he started having increased SOB last Friday-vomitied prior to SOB starting. Pt may go for blood transfusion as well since he is anemic.  On arrival today sat 83%, improving to 93% on 2L O2 at rest.  [place] has called advising him hemoglobin low and he is to go over there after our visit today, for transfusion. He is pending endoscopy to evaluate blood loss anemia tomorrow. Chronic dyspnea on exertion. 4 days ago he had a vomiting episode and since then has felt more short of breath. Occasional mild cough with white sputum. Denies fever or chill and doesn't think he has an infection.  08/22/2016-82 year old male former smoker followed for COPD, lung nodules, complicated by DM 2, CAD/CM/AICD/pacer, anemia/FE, gastric cancer FOLLOWS FOR: Pt here to see if he truly needs O2 at this time.  Had another blood transfusion over the weekend. Walk test today on room air-didn't get below 90% which is significant improvement from scores at last visit. Home oxygen was not approved by his insurance at the time because he was "acute". CXR 08/12/16- No acute cardiopulmonary abnormality. Inhalers ok. Has chemotherapy infusion pump and started XRT. Feeling much stronger.  ROS-see HPI Constitutional:   No-   weight loss, night sweats, fevers, chills, fatigue, lassitude. HEENT:   No-  headaches, difficulty swallowing, tooth/dental problems, sore throat,       No-   sneezing, itching, ear ache, +nasal congestion, post nasal drip,  CV:  No-   chest pain, orthopnea, PND, swelling in lower extremities, anasarca, dizziness, palpitations Resp: + shortness of breath with exertion or at rest.              productive cough,  + non-productive cough,  No- coughing up of blood.                change in color of mucus.  No- wheezing.   Skin: No-   rash or lesions. GI:  No-   heartburn, indigestion, abdominal pain, nausea, vomiting, GU:  MS:  No-   joint pain or swelling.   Neuro-     nothing unusual Psych:  No- change in mood or affect. No depression or anxiety.  No memory loss.  OBJ- Physical Exam General- Alert, Oriented, Affect-appropriate, Distress- NAD. Skin- rash-none, lesions- none, excoriation- none Lymphadenopathy- none Head- atraumatic            Eyes- Gross vision intact, PERRLA, conjunctivae and secretions clear            Ears- Hearing, canals-normal            Nose- Clear, no-Septal dev, mucus, polyps, erosion, perforation             Throat- Mallampati III , mucosa clear , drainage -none, tonsils- atrophic Neck- flexible , trachea midline, no stridor , thyroid nl, carotid no bruit Chest - symmetrical excursion , unlabored           Heart/CV- RRR , no murmur , no gallop  , no rub, nl s1 s2                           -  JVD- none , edema- none, stasis changes- none, varices- none           Lung- + clear today/unlabored, wheeze- none, cough+ , dullness-none, rub- none           Chest wall-  +L  AICD,  R chest port Abd-  Br/ Gen/ Rectal- Not done, not indicated Extrem- cyanosis- none, clubbing, none, atrophy- none, strength- nl Neuro- grossly intact to observation

## 2016-08-22 NOTE — Assessment & Plan Note (Signed)
Much improved since last visit. Now clear on exam. He feels adequately controlled with his routine medicines.

## 2016-08-24 ENCOUNTER — Other Ambulatory Visit: Payer: Self-pay | Admitting: Oncology

## 2016-08-25 ENCOUNTER — Ambulatory Visit
Admission: RE | Admit: 2016-08-25 | Discharge: 2016-08-25 | Disposition: A | Discharge status: Home / Self Care | Payer: Medicare HMO | Source: Ambulatory Visit | Attending: Radiation Oncology | Admitting: Radiation Oncology

## 2016-08-25 ENCOUNTER — Ambulatory Visit: Payer: Medicare HMO

## 2016-08-25 DIAGNOSIS — C160 Malignant neoplasm of cardia: Secondary | ICD-10-CM | POA: Diagnosis not present

## 2016-08-25 DIAGNOSIS — Z9221 Personal history of antineoplastic chemotherapy: Secondary | ICD-10-CM | POA: Diagnosis not present

## 2016-08-25 DIAGNOSIS — Z510 Encounter for antineoplastic radiation therapy: Secondary | ICD-10-CM | POA: Diagnosis not present

## 2016-08-25 DIAGNOSIS — I110 Hypertensive heart disease with heart failure: Secondary | ICD-10-CM | POA: Diagnosis not present

## 2016-08-25 DIAGNOSIS — J449 Chronic obstructive pulmonary disease, unspecified: Secondary | ICD-10-CM | POA: Diagnosis not present

## 2016-08-25 DIAGNOSIS — R918 Other nonspecific abnormal finding of lung field: Secondary | ICD-10-CM | POA: Diagnosis not present

## 2016-08-25 DIAGNOSIS — Z95810 Presence of automatic (implantable) cardiac defibrillator: Secondary | ICD-10-CM | POA: Diagnosis not present

## 2016-08-25 DIAGNOSIS — D649 Anemia, unspecified: Secondary | ICD-10-CM | POA: Diagnosis not present

## 2016-08-25 DIAGNOSIS — R590 Localized enlarged lymph nodes: Secondary | ICD-10-CM | POA: Diagnosis not present

## 2016-08-25 NOTE — Patient Instructions (Signed)
Implanted Port Home Guide An implanted port is a type of central line that is placed under the skin. Central lines are used to provide IV access when treatment or nutrition needs to be given through a person's veins. Implanted ports are used for long-term IV access. An implanted port may be placed because:  You need IV medicine that would be irritating to the small veins in your hands or arms.  You need long-term IV medicines, such as antibiotics.  You need IV nutrition for a long period.  You need frequent blood draws for lab tests.  You need dialysis.  Implanted ports are usually placed in the chest area, but they can also be placed in the upper arm, the abdomen, or the leg. An implanted port has two main parts:  Reservoir. The reservoir is round and will appear as a small, raised area under your skin. The reservoir is the part where a needle is inserted to give medicines or draw blood.  Catheter. The catheter is a thin, flexible tube that extends from the reservoir. The catheter is placed into a large vein. Medicine that is inserted into the reservoir goes into the catheter and then into the vein.  How will I care for my incision site? Do not get the incision site wet. Bathe or shower as directed by your health care provider. How is my port accessed? Special steps must be taken to access the port:  Before the port is accessed, a numbing cream can be placed on the skin. This helps numb the skin over the port site.  Your health care provider uses a sterile technique to access the port. ? Your health care provider must put on a mask and sterile gloves. ? The skin over your port is cleaned carefully with an antiseptic and allowed to dry. ? The port is gently pinched between sterile gloves, and a needle is inserted into the port.  Only "non-coring" port needles should be used to access the port. Once the port is accessed, a blood return should be checked. This helps ensure that the port  is in the vein and is not clogged.  If your port needs to remain accessed for a constant infusion, a clear (transparent) bandage will be placed over the needle site. The bandage and needle will need to be changed every week, or as directed by your health care provider.  Keep the bandage covering the needle clean and dry. Do not get it wet. Follow your health care provider's instructions on how to take a shower or bath while the port is accessed.  If your port does not need to stay accessed, no bandage is needed over the port.  What is flushing? Flushing helps keep the port from getting clogged. Follow your health care provider's instructions on how and when to flush the port. Ports are usually flushed with saline solution or a medicine called heparin. The need for flushing will depend on how the port is used.  If the port is used for intermittent medicines or blood draws, the port will need to be flushed: ? After medicines have been given. ? After blood has been drawn. ? As part of routine maintenance.  If a constant infusion is running, the port may not need to be flushed.  How long will my port stay implanted? The port can stay in for as long as your health care provider thinks it is needed. When it is time for the port to come out, surgery will be   done to remove it. The procedure is similar to the one performed when the port was put in. When should I seek immediate medical care? When you have an implanted port, you should seek immediate medical care if:  You notice a bad smell coming from the incision site.  You have swelling, redness, or drainage at the incision site.  You have more swelling or pain at the port site or the surrounding area.  You have a fever that is not controlled with medicine.  This information is not intended to replace advice given to you by your health care provider. Make sure you discuss any questions you have with your health care provider. Document  Released: 04/21/2005 Document Revised: 09/27/2015 Document Reviewed: 12/27/2012 Elsevier Interactive Patient Education  2017 [employer].  

## 2016-08-26 ENCOUNTER — Ambulatory Visit
Admission: RE | Admit: 2016-08-26 | Discharge: 2016-08-26 | Disposition: A | Discharge status: Home / Self Care | Payer: Medicare HMO | Source: Ambulatory Visit | Attending: Radiation Oncology | Admitting: Radiation Oncology

## 2016-08-26 ENCOUNTER — Telehealth: Payer: Self-pay

## 2016-08-26 DIAGNOSIS — R918 Other nonspecific abnormal finding of lung field: Secondary | ICD-10-CM | POA: Diagnosis not present

## 2016-08-26 DIAGNOSIS — Z95810 Presence of automatic (implantable) cardiac defibrillator: Secondary | ICD-10-CM | POA: Diagnosis not present

## 2016-08-26 DIAGNOSIS — D649 Anemia, unspecified: Secondary | ICD-10-CM | POA: Diagnosis not present

## 2016-08-26 DIAGNOSIS — R590 Localized enlarged lymph nodes: Secondary | ICD-10-CM | POA: Diagnosis not present

## 2016-08-26 DIAGNOSIS — C160 Malignant neoplasm of cardia: Secondary | ICD-10-CM | POA: Diagnosis not present

## 2016-08-26 DIAGNOSIS — J449 Chronic obstructive pulmonary disease, unspecified: Secondary | ICD-10-CM | POA: Diagnosis not present

## 2016-08-26 DIAGNOSIS — I110 Hypertensive heart disease with heart failure: Secondary | ICD-10-CM | POA: Diagnosis not present

## 2016-08-26 DIAGNOSIS — Z510 Encounter for antineoplastic radiation therapy: Secondary | ICD-10-CM | POA: Diagnosis not present

## 2016-08-26 DIAGNOSIS — Z9221 Personal history of antineoplastic chemotherapy: Secondary | ICD-10-CM | POA: Diagnosis not present

## 2016-08-26 NOTE — Telephone Encounter (Signed)
-----   Message from Egbert Garibaldi, RN sent at 08/21/2016  2:02 PM EDT ----- Regarding: Dr. Manley Mason f/u call  Pt of Dr. [name], first time 7 day 5-fu pump. Please ask how dressing is feeling. Pt has sorba view on, we started with op site, pt c/o itching, switched to sorbaview, pt reports that this dressing "felt better"

## 2016-08-26 NOTE — Telephone Encounter (Signed)
s/w pt, he states the dressing on his port is doing well. No redness nor itching on the skin. He is doing well with the continuous 5FU pump. No nausea, constipation, diarrhea. He is on a liquid diet and taking plenty of fluids.

## 2016-08-27 ENCOUNTER — Ambulatory Visit
Admission: RE | Admit: 2016-08-27 | Discharge: 2016-08-27 | Disposition: A | Discharge status: Home / Self Care | Payer: Medicare HMO | Source: Ambulatory Visit | Attending: Radiation Oncology | Admitting: Radiation Oncology

## 2016-08-27 ENCOUNTER — Ambulatory Visit: Payer: Medicare HMO | Admitting: Radiation Oncology

## 2016-08-27 DIAGNOSIS — D649 Anemia, unspecified: Secondary | ICD-10-CM | POA: Diagnosis not present

## 2016-08-27 DIAGNOSIS — Z95810 Presence of automatic (implantable) cardiac defibrillator: Secondary | ICD-10-CM | POA: Diagnosis not present

## 2016-08-27 DIAGNOSIS — R590 Localized enlarged lymph nodes: Secondary | ICD-10-CM | POA: Diagnosis not present

## 2016-08-27 DIAGNOSIS — Z510 Encounter for antineoplastic radiation therapy: Secondary | ICD-10-CM | POA: Diagnosis not present

## 2016-08-27 DIAGNOSIS — Z9221 Personal history of antineoplastic chemotherapy: Secondary | ICD-10-CM | POA: Diagnosis not present

## 2016-08-27 DIAGNOSIS — C160 Malignant neoplasm of cardia: Secondary | ICD-10-CM | POA: Diagnosis not present

## 2016-08-27 DIAGNOSIS — J449 Chronic obstructive pulmonary disease, unspecified: Secondary | ICD-10-CM | POA: Diagnosis not present

## 2016-08-27 DIAGNOSIS — R918 Other nonspecific abnormal finding of lung field: Secondary | ICD-10-CM | POA: Diagnosis not present

## 2016-08-27 DIAGNOSIS — I110 Hypertensive heart disease with heart failure: Secondary | ICD-10-CM | POA: Diagnosis not present

## 2016-08-28 ENCOUNTER — Ambulatory Visit
Admission: RE | Admit: 2016-08-28 | Discharge: 2016-08-28 | Disposition: A | Discharge status: Home / Self Care | Payer: Medicare HMO | Source: Ambulatory Visit | Attending: Radiation Oncology | Admitting: Radiation Oncology

## 2016-08-28 ENCOUNTER — Ambulatory Visit (HOSPITAL_BASED_OUTPATIENT_CLINIC_OR_DEPARTMENT_OTHER): Payer: Medicare HMO

## 2016-08-28 ENCOUNTER — Other Ambulatory Visit (HOSPITAL_BASED_OUTPATIENT_CLINIC_OR_DEPARTMENT_OTHER): Payer: Medicare HMO

## 2016-08-28 ENCOUNTER — Ambulatory Visit: Payer: Medicare HMO

## 2016-08-28 ENCOUNTER — Ambulatory Visit (HOSPITAL_BASED_OUTPATIENT_CLINIC_OR_DEPARTMENT_OTHER): Payer: Medicare HMO | Admitting: Oncology

## 2016-08-28 VITALS — BP 130/66 | HR 70 | Temp 98.2°F | Resp 16

## 2016-08-28 DIAGNOSIS — Z5111 Encounter for antineoplastic chemotherapy: Secondary | ICD-10-CM | POA: Diagnosis not present

## 2016-08-28 DIAGNOSIS — D630 Anemia in neoplastic disease: Secondary | ICD-10-CM

## 2016-08-28 DIAGNOSIS — C169 Malignant neoplasm of stomach, unspecified: Secondary | ICD-10-CM

## 2016-08-28 DIAGNOSIS — Z95828 Presence of other vascular implants and grafts: Secondary | ICD-10-CM

## 2016-08-28 DIAGNOSIS — J449 Chronic obstructive pulmonary disease, unspecified: Secondary | ICD-10-CM | POA: Diagnosis not present

## 2016-08-28 DIAGNOSIS — D649 Anemia, unspecified: Secondary | ICD-10-CM | POA: Diagnosis not present

## 2016-08-28 DIAGNOSIS — G620 Drug-induced polyneuropathy: Secondary | ICD-10-CM | POA: Diagnosis not present

## 2016-08-28 DIAGNOSIS — I110 Hypertensive heart disease with heart failure: Secondary | ICD-10-CM | POA: Diagnosis not present

## 2016-08-28 DIAGNOSIS — Z95810 Presence of automatic (implantable) cardiac defibrillator: Secondary | ICD-10-CM | POA: Diagnosis not present

## 2016-08-28 DIAGNOSIS — C162 Malignant neoplasm of body of stomach: Secondary | ICD-10-CM

## 2016-08-28 DIAGNOSIS — C7972 Secondary malignant neoplasm of left adrenal gland: Secondary | ICD-10-CM | POA: Diagnosis not present

## 2016-08-28 DIAGNOSIS — C160 Malignant neoplasm of cardia: Secondary | ICD-10-CM | POA: Diagnosis not present

## 2016-08-28 DIAGNOSIS — Z510 Encounter for antineoplastic radiation therapy: Secondary | ICD-10-CM | POA: Diagnosis not present

## 2016-08-28 DIAGNOSIS — R590 Localized enlarged lymph nodes: Secondary | ICD-10-CM | POA: Diagnosis not present

## 2016-08-28 DIAGNOSIS — R918 Other nonspecific abnormal finding of lung field: Secondary | ICD-10-CM | POA: Diagnosis not present

## 2016-08-28 DIAGNOSIS — Z9221 Personal history of antineoplastic chemotherapy: Secondary | ICD-10-CM | POA: Diagnosis not present

## 2016-08-28 LAB — CBC WITH DIFFERENTIAL/PLATELET
BASO%: 1.3 % (ref 0.0–2.0)
Basophils Absolute: 0.1 10*3/uL (ref 0.0–0.1)
EOS%: 1.9 % (ref 0.0–7.0)
Eosinophils Absolute: 0.1 10*3/uL (ref 0.0–0.5)
HCT: 34.6 % — ABNORMAL LOW (ref 38.4–49.9)
HGB: 10.8 g/dL — ABNORMAL LOW (ref 13.0–17.1)
LYMPH%: 7.1 % — ABNORMAL LOW (ref 14.0–49.0)
MCH: 26.7 pg — ABNORMAL LOW (ref 27.2–33.4)
MCHC: 31.4 g/dL — ABNORMAL LOW (ref 32.0–36.0)
MCV: 85 fL (ref 79.3–98.0)
MONO#: 0.3 10*3/uL (ref 0.1–0.9)
MONO%: 7 % (ref 0.0–14.0)
NEUT#: 4.1 10*3/uL (ref 1.5–6.5)
NEUT%: 82.7 % — ABNORMAL HIGH (ref 39.0–75.0)
Platelets: 225 10*3/uL (ref 140–400)
RBC: 4.07 10*6/uL — ABNORMAL LOW (ref 4.20–5.82)
RDW: 17.7 % — ABNORMAL HIGH (ref 11.0–14.6)
WBC: 5 10*3/uL (ref 4.0–10.3)
lymph#: 0.4 10*3/uL — ABNORMAL LOW (ref 0.9–3.3)

## 2016-08-28 MED ORDER — SODIUM CHLORIDE 0.9 % IJ SOLN
10.0000 mL | INTRAMUSCULAR | Status: DC | PRN
  Filled 2016-08-28: qty 10

## 2016-08-28 MED ORDER — SODIUM CHLORIDE 0.9 % IV SOLN
225.0000 mg/m2/d | INTRAVENOUS | Status: DC
  Administered 2016-08-28: 3650 mg via INTRAVENOUS
  Filled 2016-08-28: qty 73

## 2016-08-28 MED ORDER — SODIUM CHLORIDE 0.9 % IV SOLN: Freq: Once | INTRAVENOUS | Status: DC

## 2016-08-28 NOTE — Progress Notes (Signed)
[name] was deaccessed, appearance was scaly, red, and appearance of pus coming from [name]. Talk to Dr. [name] nurse. Did not reaccess pt. Put pt in exam room for further evaluation. Joseph Cates LPN

## 2016-08-28 NOTE — Patient Instructions (Signed)
[facility] Discharge Instructions for Patients Receiving Chemotherapy  Today you received the following chemotherapy agents 5-FU.  To help prevent nausea and vomiting after your treatment, we encourage you to take your nausea medication.   If you develop nausea and vomiting that is not controlled by your nausea medication, call the clinic.   BELOW ARE SYMPTOMS THAT SHOULD BE REPORTED IMMEDIATELY:  *FEVER GREATER THAN 100.5 F  *CHILLS WITH OR WITHOUT FEVER  NAUSEA AND VOMITING THAT IS NOT CONTROLLED WITH YOUR NAUSEA MEDICATION  *UNUSUAL SHORTNESS OF BREATH  *UNUSUAL BRUISING OR BLEEDING  TENDERNESS IN MOUTH AND THROAT WITH OR WITHOUT PRESENCE OF ULCERS  *URINARY PROBLEMS  *BOWEL PROBLEMS  UNUSUAL RASH Items with * indicate a potential emergency and should be followed up as soon as possible.  Feel free to call the clinic you have any questions or concerns. The clinic phone number is [phone].  Please show the [facility] at check-in to the Emergency Department and triage nurse.

## 2016-08-28 NOTE — Progress Notes (Signed)
Skin irritation and small amount of discharge noted at port site, Dr. [name] aware and okay to proceed. Per Dr. Benson Norway to proceed with treatment with CMET from 08/19/16. Pt to come to clinic for dressing change 08/29/16 and 09/01/16 and be seen in clinic 09/04/16 with treatment. appts made, pt aware and verbalizes understanding.

## 2016-08-29 ENCOUNTER — Other Ambulatory Visit: Payer: Self-pay | Admitting: *Deleted

## 2016-08-29 ENCOUNTER — Ambulatory Visit: Payer: Medicare HMO

## 2016-08-29 ENCOUNTER — Ambulatory Visit
Admission: RE | Admit: 2016-08-29 | Discharge: 2016-08-29 | Disposition: A | Discharge status: Home / Self Care | Payer: Medicare HMO | Source: Ambulatory Visit | Attending: Radiation Oncology | Admitting: Radiation Oncology

## 2016-08-29 DIAGNOSIS — Z95810 Presence of automatic (implantable) cardiac defibrillator: Secondary | ICD-10-CM | POA: Diagnosis not present

## 2016-08-29 DIAGNOSIS — J449 Chronic obstructive pulmonary disease, unspecified: Secondary | ICD-10-CM | POA: Diagnosis not present

## 2016-08-29 DIAGNOSIS — Z510 Encounter for antineoplastic radiation therapy: Secondary | ICD-10-CM | POA: Diagnosis not present

## 2016-08-29 DIAGNOSIS — R590 Localized enlarged lymph nodes: Secondary | ICD-10-CM | POA: Diagnosis not present

## 2016-08-29 DIAGNOSIS — D649 Anemia, unspecified: Secondary | ICD-10-CM | POA: Diagnosis not present

## 2016-08-29 DIAGNOSIS — I110 Hypertensive heart disease with heart failure: Secondary | ICD-10-CM | POA: Diagnosis not present

## 2016-08-29 DIAGNOSIS — R918 Other nonspecific abnormal finding of lung field: Secondary | ICD-10-CM | POA: Diagnosis not present

## 2016-08-29 DIAGNOSIS — C160 Malignant neoplasm of cardia: Secondary | ICD-10-CM | POA: Diagnosis not present

## 2016-08-29 DIAGNOSIS — Z9221 Personal history of antineoplastic chemotherapy: Secondary | ICD-10-CM | POA: Diagnosis not present

## 2016-08-29 NOTE — Progress Notes (Signed)
Late entry for 4/26: Inflammation and desquamation noted at port site and in a square shape around port. No drainage noted. Port assessed by Dr. [name] and Ned Card, NP: Likely a tape reaction, re-access port to see if there is any purulent drainage, per Dr. [name]. Ailene Ravel, Infusion RN informed. Port accessed, no drainage noted. Gauze dressing applied with Op-Site transparent dressing with a larger border to avoid irritated skin.

## 2016-08-29 NOTE — Progress Notes (Signed)
[facility] OFFICE PROGRESS NOTE   Diagnosis: Gastric cancer  INTERVAL HISTORY:   [name] returns for an unscheduled visit. He began radiation and infusional 5-fluorouracil on 08/21/2016. He was here a for scheduled 5-FU pump exchange today. The chemotherapy RN noted discoloration surrounding the Port-A-Cath with purulent drainage. I was asked to evaluate [name]. He denies fever and pain at the Port-A-Cath site.  Objective:  Vital signs in last 24 hours:     Portacath/PICC-there is hyperpigmentation in a square tape distribution surrounding the Port-A-Cath. No fluctuance or erythema. The skin is thickened and scaling in several areas. 1 mm pustule at the needle puncture site. I cannot express discharge from the Port-A-Cath. No tenderness along the subcutaneous tract.  Lab Results:  Lab Results  Component Value Date   WBC 5.0 08/28/2016   HGB 10.8 (L) 08/28/2016   HCT 34.6 (L) 08/28/2016   MCV 85.0 08/28/2016   PLT 225 08/28/2016   NEUTROABS 4.1 08/28/2016    CMP     Component Value Date/Time   NA 137 08/19/2016 1121   K 5.1 08/19/2016 1121   CL 105 02/18/2016 1351   CO2 25 08/19/2016 1121   GLUCOSE 77 08/19/2016 1121   BUN 10.3 08/19/2016 1121   CREATININE 1.1 08/19/2016 1121   CALCIUM 9.3 08/19/2016 1121   PROT 6.3 (L) 08/19/2016 1121   ALBUMIN 3.4 (L) 08/19/2016 1121   AST 17 08/19/2016 1121   ALT 10 08/19/2016 1121   ALKPHOS 120 08/19/2016 1121   BILITOT 0.43 08/19/2016 1121   GFRNONAA >60 07/11/2015 1035   GFRAA >60 07/11/2015 1035    Lab Results  Component Value Date   CEA1 3.4 07/19/2015    Lab Results  Component Value Date   INR 1.0 01/02/2014    Imaging:  No results found.  Medications: I have reviewed the patient's current medications.  Assessment/Plan: 1. Gastric Cancer  Lesser curvature gastric body mass noted on endoscopy 06/27/2015, biopsy confirmed adenocarcinoma  Staging CT scans to 06/28/2015 with a  gastric body mass, adenopathy adjacent to the stomach, chest adenopathy, a single enhancing liver lesion, and indeterminate nodular pulmonary lesions  PET scan 07/12/2015-hypermetabolic gastric mass, gastrohepatic ligament metastases, metastasis at the left adrenal gland, hypermetabolic right paratracheal, right hilar, and subcarinal nodes moderate metabolic activity involving 2 pulmonary lesions  Cycle 1 FOLFOX 07/19/2015  Cycle 2 FOLFOX 08/02/2015  Cycle 3 FOLFOX 08/23/2015 with Neulasta support  Cycle 4 FOLFOX 09/06/2015 with Neulasta support  Cycle 5 FOLFOX 09/20/2015 with Neulasta support  Restaging CTs 10/02/2015-decrease in chest and perigastric lymphadenopathy, decrease thickening at the distal stomach, 11 mm sclerotic lesion in the posterior left third rib-barely visible on the PET/CT 07/12/2015  Cycle 6 FOLFOX 10/04/2015  Cycle 7 FOLFOX 10/18/2015  Cycle 8 FOLFOX 11/08/2015 (oxaliplatin held due to neuropathy)  Cycle 9 FOLFOX 11/22/2015 (oxaliplatin held secondary to neuropathy)  Cycle 10 FOLFOX 12/06/2015 (oxaliplatin held secondary to neuropathy)  CTs 12/24/2015-stable lung nodules and mediastinal lymph nodes, no evidence of disease progression  Upper endoscopy 08/13/2016 showed a large ulcerated circumferential mass with no bleeding in the gastric antrum and at the pylorus causing partial outlet obstruction.  2. COPD  3. Nonischemic cardiomyopathy  4. Implanted cardiac defibrillator  5. History of prostate cancer  6. Hypertension  7. History of Iron deficiency anemia-hemoglobin lower 3 20,018  8. Neutropenia secondary to chemotherapy-Neulasta added with cycle 3 FOLFOX 08/23/2015  9. 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 Anemia  secondary to blood loss related to #1. Blood transfusion 08/12/2016, 08/16/2016.  12. 08/28/2016-hyperpigmentation with skin induration surrounding the Port-A-Cath, most  likely a tape or skin cleanser reaction     Disposition:  He is completing infusional 5-FU and concurrent radiation for treatment of local progression of gastric cancer. He presents today for a 5-FU pump exchange. The Port-A-Cath site is discolored. He does not have a fever or other symptoms/signs of an infection. I suspect the skin reaction is related to tape or skin cleanser.  He will continue the 5-FU infusion.  Betsy Coder, MD  08/29/2016  5:36 PM   addendum: [name] returned to the [facility] on 08/29/2016. The Port-A-Cath site was noted to be hyperpigmented with induration, skin thickening, and a light yellow/green drainage from the skin. No drainage from the Port-A-Cath itself. No fluctuance. No tenderness. The area of discoloration appears to extend outside of the area covered by tape. He was evaluated by myself, Dr. Alen Blew, and multiple chemotherapy nurses. We feel this most likely represents a reaction to the cleaning solution or tape. We have a low clinical suspicion for an infection or chemotherapy extravasation.Marland Kitchen He knows to contact us for pain or fever. There is good blood return from the Port-A-Cath. He will return for further evaluation on 09/01/2016.

## 2016-08-29 NOTE — Progress Notes (Signed)
Pt to flush room for dressing change to right port site.  Gauze to site saturated with yellow-green colored drainage.  Positive blood return from port.  Patient denies any pain or discomfort to port site. Dressing removed and site assessed by Dr. [name].  Order received to cover site with telfa dressing and paper tape and to have patient return on Monday, 09/01/16 for [facility] visit and dressing change.  Site cleansed with normal saline, covered with telfa dressing and paper tape as ordered.  Patient instructed to arrive to [facility] at 11:00AM on Monday.  Patient instructed to go to the ER over the weekend if he experiences any chills or fever.  Patient verbalizes an understanding of all instructions and has no questions at this time.

## 2016-08-31 ENCOUNTER — Other Ambulatory Visit: Payer: Self-pay | Admitting: Oncology

## 2016-09-01 ENCOUNTER — Ambulatory Visit: Payer: Medicare HMO

## 2016-09-01 ENCOUNTER — Ambulatory Visit
Admission: RE | Admit: 2016-09-01 | Discharge: 2016-09-01 | Disposition: A | Discharge status: Home / Self Care | Payer: Medicare HMO | Source: Ambulatory Visit | Attending: Radiation Oncology | Admitting: Radiation Oncology

## 2016-09-01 ENCOUNTER — Telehealth: Payer: Self-pay | Admitting: Oncology

## 2016-09-01 DIAGNOSIS — R590 Localized enlarged lymph nodes: Secondary | ICD-10-CM | POA: Diagnosis not present

## 2016-09-01 DIAGNOSIS — Z510 Encounter for antineoplastic radiation therapy: Secondary | ICD-10-CM | POA: Diagnosis not present

## 2016-09-01 DIAGNOSIS — D649 Anemia, unspecified: Secondary | ICD-10-CM | POA: Diagnosis not present

## 2016-09-01 DIAGNOSIS — R918 Other nonspecific abnormal finding of lung field: Secondary | ICD-10-CM | POA: Diagnosis not present

## 2016-09-01 DIAGNOSIS — I110 Hypertensive heart disease with heart failure: Secondary | ICD-10-CM | POA: Diagnosis not present

## 2016-09-01 DIAGNOSIS — C160 Malignant neoplasm of cardia: Secondary | ICD-10-CM | POA: Diagnosis not present

## 2016-09-01 DIAGNOSIS — J449 Chronic obstructive pulmonary disease, unspecified: Secondary | ICD-10-CM | POA: Diagnosis not present

## 2016-09-01 DIAGNOSIS — Z95810 Presence of automatic (implantable) cardiac defibrillator: Secondary | ICD-10-CM | POA: Diagnosis not present

## 2016-09-01 DIAGNOSIS — Z9221 Personal history of antineoplastic chemotherapy: Secondary | ICD-10-CM | POA: Diagnosis not present

## 2016-09-01 NOTE — Progress Notes (Signed)
Pt here to have port site /skin around port re-evaluated. See note from 08/29/16. Pt is due to have 5FU pump removed on Thursday, 09/04/16. Pt states no problem with pump. Good blood return. Dressing removed. No purulent drainage noted. Skin around port (approx 5" in diameter) is discolored-darker then pt's normal skin tones, top layer of skin in very dry, flaky. No oozing areas, no bleeding. Pt denies pain, itching.  Dr. [name] in to evaluate.  This nurse saw the area last Friday and it does look better-drying. No new areas of irritation etc. Ok to clean with betadine and re-dress with telfa dressing. Therefore area cleansed with betadine, bio patch replaced, entire affected skin covered with telfa non-stick pads and secured with paper tape. Pt knows to call us with any change in port site-pain, swelling, drainage, fever etc. Pt appreciative of care provided

## 2016-09-01 NOTE — Telephone Encounter (Signed)
No los per 08/28/16 visit.

## 2016-09-02 ENCOUNTER — Ambulatory Visit
Admission: RE | Admit: 2016-09-02 | Discharge: 2016-09-02 | Disposition: A | Discharge status: Home / Self Care | Payer: Medicare HMO | Source: Ambulatory Visit | Attending: Radiation Oncology | Admitting: Radiation Oncology

## 2016-09-02 ENCOUNTER — Ambulatory Visit: Payer: Medicare HMO

## 2016-09-02 ENCOUNTER — Other Ambulatory Visit: Payer: Self-pay | Admitting: Oncology

## 2016-09-02 DIAGNOSIS — Z9221 Personal history of antineoplastic chemotherapy: Secondary | ICD-10-CM | POA: Diagnosis not present

## 2016-09-02 DIAGNOSIS — Z510 Encounter for antineoplastic radiation therapy: Secondary | ICD-10-CM | POA: Diagnosis not present

## 2016-09-02 DIAGNOSIS — I110 Hypertensive heart disease with heart failure: Secondary | ICD-10-CM | POA: Diagnosis not present

## 2016-09-02 DIAGNOSIS — D649 Anemia, unspecified: Secondary | ICD-10-CM | POA: Diagnosis not present

## 2016-09-02 DIAGNOSIS — C169 Malignant neoplasm of stomach, unspecified: Secondary | ICD-10-CM

## 2016-09-02 DIAGNOSIS — R590 Localized enlarged lymph nodes: Secondary | ICD-10-CM | POA: Diagnosis not present

## 2016-09-02 DIAGNOSIS — C160 Malignant neoplasm of cardia: Secondary | ICD-10-CM | POA: Diagnosis not present

## 2016-09-02 DIAGNOSIS — R918 Other nonspecific abnormal finding of lung field: Secondary | ICD-10-CM | POA: Diagnosis not present

## 2016-09-02 DIAGNOSIS — Z95810 Presence of automatic (implantable) cardiac defibrillator: Secondary | ICD-10-CM | POA: Diagnosis not present

## 2016-09-02 DIAGNOSIS — J449 Chronic obstructive pulmonary disease, unspecified: Secondary | ICD-10-CM | POA: Diagnosis not present

## 2016-09-03 ENCOUNTER — Ambulatory Visit: Payer: Medicare HMO

## 2016-09-03 ENCOUNTER — Other Ambulatory Visit: Payer: Self-pay | Admitting: Internal Medicine

## 2016-09-03 ENCOUNTER — Ambulatory Visit
Admission: RE | Admit: 2016-09-03 | Discharge: 2016-09-03 | Disposition: A | Discharge status: Home / Self Care | Payer: Medicare HMO | Source: Ambulatory Visit | Attending: Radiation Oncology | Admitting: Radiation Oncology

## 2016-09-03 DIAGNOSIS — R590 Localized enlarged lymph nodes: Secondary | ICD-10-CM | POA: Diagnosis not present

## 2016-09-03 DIAGNOSIS — C160 Malignant neoplasm of cardia: Secondary | ICD-10-CM | POA: Diagnosis not present

## 2016-09-03 DIAGNOSIS — J449 Chronic obstructive pulmonary disease, unspecified: Secondary | ICD-10-CM | POA: Diagnosis not present

## 2016-09-03 DIAGNOSIS — Z510 Encounter for antineoplastic radiation therapy: Secondary | ICD-10-CM | POA: Diagnosis not present

## 2016-09-03 DIAGNOSIS — R918 Other nonspecific abnormal finding of lung field: Secondary | ICD-10-CM | POA: Diagnosis not present

## 2016-09-03 DIAGNOSIS — I110 Hypertensive heart disease with heart failure: Secondary | ICD-10-CM | POA: Diagnosis not present

## 2016-09-03 DIAGNOSIS — Z95810 Presence of automatic (implantable) cardiac defibrillator: Secondary | ICD-10-CM | POA: Diagnosis not present

## 2016-09-03 DIAGNOSIS — Z9221 Personal history of antineoplastic chemotherapy: Secondary | ICD-10-CM | POA: Diagnosis not present

## 2016-09-03 DIAGNOSIS — D649 Anemia, unspecified: Secondary | ICD-10-CM | POA: Diagnosis not present

## 2016-09-04 ENCOUNTER — Ambulatory Visit: Payer: Medicare HMO

## 2016-09-04 ENCOUNTER — Ambulatory Visit (HOSPITAL_BASED_OUTPATIENT_CLINIC_OR_DEPARTMENT_OTHER): Payer: Medicare HMO

## 2016-09-04 ENCOUNTER — Telehealth: Payer: Self-pay | Admitting: Nurse Practitioner

## 2016-09-04 ENCOUNTER — Other Ambulatory Visit (HOSPITAL_BASED_OUTPATIENT_CLINIC_OR_DEPARTMENT_OTHER): Payer: Medicare HMO

## 2016-09-04 ENCOUNTER — Ambulatory Visit (HOSPITAL_BASED_OUTPATIENT_CLINIC_OR_DEPARTMENT_OTHER): Payer: Medicare HMO | Admitting: Nurse Practitioner

## 2016-09-04 ENCOUNTER — Ambulatory Visit
Admission: RE | Admit: 2016-09-04 | Discharge: 2016-09-04 | Disposition: A | Discharge status: Home / Self Care | Payer: Medicare HMO | Source: Ambulatory Visit | Attending: Radiation Oncology | Admitting: Radiation Oncology

## 2016-09-04 VITALS — BP 130/62 | HR 69 | Temp 98.1°F | Resp 18 | Ht 74.0 in | Wt 216.9 lb

## 2016-09-04 DIAGNOSIS — C7972 Secondary malignant neoplasm of left adrenal gland: Secondary | ICD-10-CM | POA: Diagnosis not present

## 2016-09-04 DIAGNOSIS — D649 Anemia, unspecified: Secondary | ICD-10-CM | POA: Diagnosis not present

## 2016-09-04 DIAGNOSIS — I10 Essential (primary) hypertension: Secondary | ICD-10-CM | POA: Diagnosis not present

## 2016-09-04 DIAGNOSIS — C162 Malignant neoplasm of body of stomach: Secondary | ICD-10-CM | POA: Diagnosis not present

## 2016-09-04 DIAGNOSIS — Z5111 Encounter for antineoplastic chemotherapy: Secondary | ICD-10-CM | POA: Diagnosis not present

## 2016-09-04 DIAGNOSIS — J449 Chronic obstructive pulmonary disease, unspecified: Secondary | ICD-10-CM | POA: Diagnosis not present

## 2016-09-04 DIAGNOSIS — Z510 Encounter for antineoplastic radiation therapy: Secondary | ICD-10-CM | POA: Diagnosis not present

## 2016-09-04 DIAGNOSIS — Z95810 Presence of automatic (implantable) cardiac defibrillator: Secondary | ICD-10-CM | POA: Diagnosis not present

## 2016-09-04 DIAGNOSIS — C160 Malignant neoplasm of cardia: Secondary | ICD-10-CM

## 2016-09-04 DIAGNOSIS — R590 Localized enlarged lymph nodes: Secondary | ICD-10-CM | POA: Diagnosis not present

## 2016-09-04 DIAGNOSIS — D630 Anemia in neoplastic disease: Secondary | ICD-10-CM

## 2016-09-04 DIAGNOSIS — C169 Malignant neoplasm of stomach, unspecified: Secondary | ICD-10-CM

## 2016-09-04 DIAGNOSIS — G620 Drug-induced polyneuropathy: Secondary | ICD-10-CM

## 2016-09-04 DIAGNOSIS — R918 Other nonspecific abnormal finding of lung field: Secondary | ICD-10-CM | POA: Diagnosis not present

## 2016-09-04 DIAGNOSIS — I110 Hypertensive heart disease with heart failure: Secondary | ICD-10-CM | POA: Diagnosis not present

## 2016-09-04 DIAGNOSIS — Z9221 Personal history of antineoplastic chemotherapy: Secondary | ICD-10-CM | POA: Diagnosis not present

## 2016-09-04 LAB — CBC WITH DIFFERENTIAL/PLATELET
BASO%: 0.4 % (ref 0.0–2.0)
BASOS ABS: 0 10*3/uL (ref 0.0–0.1)
EOS ABS: 0.1 10*3/uL (ref 0.0–0.5)
EOS%: 1.8 % (ref 0.0–7.0)
HEMATOCRIT: 34.9 % — AB (ref 38.4–49.9)
HEMOGLOBIN: 11.1 g/dL — AB (ref 13.0–17.1)
LYMPH%: 3.9 % — ABNORMAL LOW (ref 14.0–49.0)
MCH: 27.1 pg — ABNORMAL LOW (ref 27.2–33.4)
MCHC: 31.7 g/dL — ABNORMAL LOW (ref 32.0–36.0)
MCV: 85.3 fL (ref 79.3–98.0)
MONO#: 0.4 10*3/uL (ref 0.1–0.9)
MONO%: 8.2 % (ref 0.0–14.0)
NEUT#: 3.8 10*3/uL (ref 1.5–6.5)
NEUT%: 85.7 % — ABNORMAL HIGH (ref 39.0–75.0)
Platelets: 173 10*3/uL (ref 140–400)
RBC: 4.09 10*6/uL — ABNORMAL LOW (ref 4.20–5.82)
RDW: 19 % — AB (ref 11.0–14.6)
WBC: 4.4 10*3/uL (ref 4.0–10.3)
lymph#: 0.2 10*3/uL — ABNORMAL LOW (ref 0.9–3.3)

## 2016-09-04 MED ORDER — SODIUM CHLORIDE 0.9 % IV SOLN
225.0000 mg/m2/d | INTRAVENOUS | Status: DC
  Administered 2016-09-04: 3650 mg via INTRAVENOUS
  Filled 2016-09-04: qty 73

## 2016-09-04 MED ORDER — SODIUM CHLORIDE 0.9% FLUSH
10.0000 mL | INTRAVENOUS | Status: DC | PRN
  Filled 2016-09-04: qty 10

## 2016-09-04 NOTE — Telephone Encounter (Signed)
Appointments scheduled per 5.3.18 LOS. Patient given AVS report and calendars with future scheduled appointments. °

## 2016-09-04 NOTE — Progress Notes (Signed)
Patient presented to treatment area for pump d/c and reaccess. Noted large/bulky non-occlusive gauze and paper tape bandage over site. Significant skin irritation noted under bandage. No drainage present. Patient denies pain or fever. Cleaned area with CHG followed by soap and warm water. Did not reaccess site to allow provider to assess.

## 2016-09-04 NOTE — Progress Notes (Addendum)
[facility] OFFICE PROGRESS NOTE   Diagnosis:  Gastric cancer  INTERVAL HISTORY:   [name] returns as scheduled. He denies nausea. No vomiting. He denies any bleeding. No mouth sores. No diarrhea. No hand or foot pain or redness. No fever. He noted itching of the skin surrounding the Port-A-Cath after the area was cleansed.  Objective:  Vital signs in last 24 hours:  Blood pressure 130/62, pulse 69, temperature 98.1 F (36.7 C), temperature source Oral, resp. rate 18, height '6\' 2"'$  (1.88 m), weight 216 lb 14.4 oz (98.4 kg), SpO2 99 %.    HEENT: No thrush or ulcers. Resp: Lungs clear bilaterally. Cardio: Regular rate and rhythm. GI: Abdomen soft and nontender. No hepatomegaly. Vascular: No leg edema. Skin: Palms with hyperpigmentation. No erythema or skin breakdown. Skin surrounding the Port-A-Cath is hyperpigmented, thickened with scaling in multiple areas. No drainage noted. No erythema.    Lab Results:  Lab Results  Component Value Date   WBC 4.4 09/04/2016   HGB 11.1 (L) 09/04/2016   HCT 34.9 (L) 09/04/2016   MCV 85.3 09/04/2016   PLT 173 09/04/2016   NEUTROABS 3.8 09/04/2016    Imaging:  No results found.  Medications: I have reviewed the patient's current medications.  Assessment/Plan: 1. Gastric Cancer  Lesser curvature gastric body mass noted on endoscopy 06/27/2015, biopsy confirmed adenocarcinoma  Staging CT scans to 06/28/2015 with a gastric body mass, adenopathy adjacent to the stomach, chest adenopathy, a single enhancing liver lesion, and indeterminate nodular pulmonary lesions  PET scan 07/12/2015-hypermetabolic gastric mass, gastrohepatic ligament metastases, metastasis at the left adrenal gland, hypermetabolic right paratracheal, right hilar, and subcarinal nodes moderate metabolic activity involving 2 pulmonary lesions  Cycle 1 FOLFOX 07/19/2015  Cycle 2 FOLFOX 08/02/2015  Cycle 3 FOLFOX 08/23/2015 with Neulasta  support  Cycle 4 FOLFOX 09/06/2015 with Neulasta support  Cycle 5 FOLFOX 09/20/2015 with Neulasta support  Restaging CTs 10/02/2015-decrease in chest and perigastric lymphadenopathy, decrease thickening at the distal stomach, 11 mm sclerotic lesion in the posterior left third rib-barely visible on the PET/CT 07/12/2015  Cycle 6 FOLFOX 10/04/2015  Cycle 7 FOLFOX 10/18/2015  Cycle 8 FOLFOX 11/08/2015 (oxaliplatin held due to neuropathy)  Cycle 9 FOLFOX 11/22/2015 (oxaliplatin held secondary to neuropathy)  Cycle 10 FOLFOX 12/06/2015 (oxaliplatin held secondary to neuropathy)  CTs 12/24/2015-stable lung nodules and mediastinal lymph nodes, no evidence of disease progression  Upper endoscopy 08/13/2016 showed a large ulcerated circumferential mass with no bleeding in the gastric antrum and at the pylorus causing partial outlet obstruction.  Initiation of infusional 5-FU and radiation 08/21/2016  2. COPD  3. Nonischemic cardiomyopathy  4. Implanted cardiac defibrillator  5. History of prostate cancer  6. Hypertension  7. History of Iron deficiency anemia-hemoglobin lower 07/22/2016  8. Neutropenia secondary to chemotherapy-Neulasta added with cycle 3 FOLFOX 08/23/2015  9. 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 Anemia secondary to blood loss related to #1. Blood transfusion 08/12/2016,08/16/2016. Hemoglobin improved 09/04/2016.  12. 08/28/2016-hyperpigmentation with skin induration surrounding the Port-A-Cath, most likely a tape or skin cleanser reaction    Disposition: [name] appears stable. He will continue infusional 5-FU and concurrent radiation. He will complete the course of radiation on 09/10/2016. The pump will be discontinued on 09/11/2016.  There is persistent skin hyperpigmentation, scaling surrounding the Port-A-Cath. This is likely due to a reaction to the cleaning solution or tape and appears  to be healing.  He is tolerating a liquid diet. He  will advance to a mechanical soft diet.  He will return for a follow-up visit in 2 weeks. He will contact the office in the interim with any problems.   Patient seen with Dr. [name].   Ned Card ANP/GNP-BC   09/04/2016  11:29 AM  This was a shared visit with Ned Card. [name] was examined. The contact dermatitis at the Port-A-Cath site appears to be improving. He will complete the course of radiation and infusional 5-FU over the next week.  Julieanne Manson, M.D.

## 2016-09-04 NOTE — Patient Instructions (Signed)
[facility] Discharge Instructions for Patients Receiving Chemotherapy  Today you received the following chemotherapy agents 5-FU.  To help prevent nausea and vomiting after your treatment, we encourage you to take your nausea medication.   If you develop nausea and vomiting that is not controlled by your nausea medication, call the clinic.   BELOW ARE SYMPTOMS THAT SHOULD BE REPORTED IMMEDIATELY:  *FEVER GREATER THAN 100.5 F  *CHILLS WITH OR WITHOUT FEVER  NAUSEA AND VOMITING THAT IS NOT CONTROLLED WITH YOUR NAUSEA MEDICATION  *UNUSUAL SHORTNESS OF BREATH  *UNUSUAL BRUISING OR BLEEDING  TENDERNESS IN MOUTH AND THROAT WITH OR WITHOUT PRESENCE OF ULCERS  *URINARY PROBLEMS  *BOWEL PROBLEMS  UNUSUAL RASH Items with * indicate a potential emergency and should be followed up as soon as possible.  Feel free to call the clinic you have any questions or concerns. The clinic phone number is [phone].  Please show the [facility] at check-in to the Emergency Department and triage nurse.

## 2016-09-04 NOTE — Progress Notes (Signed)
Per Ned Card, NP okay to treat today with CMET results from 2 weeks ago.

## 2016-09-05 ENCOUNTER — Ambulatory Visit: Payer: Medicare HMO

## 2016-09-05 ENCOUNTER — Ambulatory Visit
Admission: RE | Admit: 2016-09-05 | Discharge: 2016-09-05 | Disposition: A | Discharge status: Home / Self Care | Payer: Medicare HMO | Source: Ambulatory Visit | Attending: Radiation Oncology | Admitting: Radiation Oncology

## 2016-09-05 DIAGNOSIS — I110 Hypertensive heart disease with heart failure: Secondary | ICD-10-CM | POA: Diagnosis not present

## 2016-09-05 DIAGNOSIS — C160 Malignant neoplasm of cardia: Secondary | ICD-10-CM | POA: Diagnosis not present

## 2016-09-05 DIAGNOSIS — R918 Other nonspecific abnormal finding of lung field: Secondary | ICD-10-CM | POA: Diagnosis not present

## 2016-09-05 DIAGNOSIS — Z9221 Personal history of antineoplastic chemotherapy: Secondary | ICD-10-CM | POA: Diagnosis not present

## 2016-09-05 DIAGNOSIS — R590 Localized enlarged lymph nodes: Secondary | ICD-10-CM | POA: Diagnosis not present

## 2016-09-05 DIAGNOSIS — J449 Chronic obstructive pulmonary disease, unspecified: Secondary | ICD-10-CM | POA: Diagnosis not present

## 2016-09-05 DIAGNOSIS — Z95810 Presence of automatic (implantable) cardiac defibrillator: Secondary | ICD-10-CM | POA: Diagnosis not present

## 2016-09-05 DIAGNOSIS — D649 Anemia, unspecified: Secondary | ICD-10-CM | POA: Diagnosis not present

## 2016-09-05 DIAGNOSIS — Z510 Encounter for antineoplastic radiation therapy: Secondary | ICD-10-CM | POA: Diagnosis not present

## 2016-09-05 LAB — PSA: Prostate Specific Ag, Serum: 0.4 ng/mL (ref 0.0–4.0)

## 2016-09-06 ENCOUNTER — Ambulatory Visit: Payer: Medicare HMO

## 2016-09-06 ENCOUNTER — Other Ambulatory Visit: Payer: Self-pay | Admitting: Oncology

## 2016-09-06 NOTE — Progress Notes (Unsigned)
Pt walked in to treatment room at 930am stating " I have a problem ".  Pt showed this RN port site with noted dressing of multiple gauze with 4 op-site dressings applied.  Noted gauze yellow in color as well as noted darkening of skin under dressing.  Mr Sheffler states " this happened before and they changed the dressing on Thursday but they made me see the doctor first."  " I am hoping you can change the dressing again "  Current dressing removed with no noted odor. Site of darkened skin approximately the size of a salad plate. Skin intact with no swelling. Site observed including verifying pump running with no noted drainage from area of needle insertion.  Per discussion with pt of above including of possible removal of pump - Mr Carlile declined d/cing of pump and per his visual inspection stated " looks about the same ".  New dressing applied post cleansing and applying new bio patch with gauze and op-site. Secured with paper tape.  This RN advised for pt to come in Monday post radiation for further evaluation of site and possible need for dressing change or other interventions.

## 2016-09-08 ENCOUNTER — Ambulatory Visit
Admission: RE | Admit: 2016-09-08 | Discharge: 2016-09-08 | Disposition: A | Discharge status: Home / Self Care | Payer: Medicare HMO | Source: Ambulatory Visit | Attending: Radiation Oncology | Admitting: Radiation Oncology

## 2016-09-08 ENCOUNTER — Ambulatory Visit: Payer: Medicare HMO

## 2016-09-08 ENCOUNTER — Encounter: Payer: Self-pay | Admitting: *Deleted

## 2016-09-08 DIAGNOSIS — R590 Localized enlarged lymph nodes: Secondary | ICD-10-CM | POA: Diagnosis not present

## 2016-09-08 DIAGNOSIS — Z9221 Personal history of antineoplastic chemotherapy: Secondary | ICD-10-CM | POA: Diagnosis not present

## 2016-09-08 DIAGNOSIS — J449 Chronic obstructive pulmonary disease, unspecified: Secondary | ICD-10-CM | POA: Diagnosis not present

## 2016-09-08 DIAGNOSIS — C160 Malignant neoplasm of cardia: Secondary | ICD-10-CM | POA: Diagnosis not present

## 2016-09-08 DIAGNOSIS — Z510 Encounter for antineoplastic radiation therapy: Secondary | ICD-10-CM | POA: Diagnosis not present

## 2016-09-08 DIAGNOSIS — I110 Hypertensive heart disease with heart failure: Secondary | ICD-10-CM | POA: Diagnosis not present

## 2016-09-08 DIAGNOSIS — Z95810 Presence of automatic (implantable) cardiac defibrillator: Secondary | ICD-10-CM | POA: Diagnosis not present

## 2016-09-08 DIAGNOSIS — D649 Anemia, unspecified: Secondary | ICD-10-CM | POA: Diagnosis not present

## 2016-09-08 DIAGNOSIS — R918 Other nonspecific abnormal finding of lung field: Secondary | ICD-10-CM | POA: Diagnosis not present

## 2016-09-08 NOTE — Progress Notes (Signed)
See documentation encounter for dressing change.

## 2016-09-08 NOTE — Progress Notes (Signed)
Patient to infusion room for dressing change to right port.  Dressing to right chest port intact with small amount of yellow drainage.  Skin remains dark with no odor.  No change in appearance to site per Billy Coast RN who observed site on 09/06/16.  Site cleansed with betadine, new biopatch applied, telfa and 4x4 dsd gauze applied and covered with opcite and paper tape.  Pt instructed to return for dressing change on 09/10/16 and pump d/c per order of Dr. [name]. Pt verbalizes an understanding of instructions and has no questions at this time.

## 2016-09-09 ENCOUNTER — Ambulatory Visit
Admission: RE | Admit: 2016-09-09 | Discharge: 2016-09-09 | Disposition: A | Discharge status: Home / Self Care | Payer: Medicare HMO | Source: Ambulatory Visit | Attending: Radiation Oncology | Admitting: Radiation Oncology

## 2016-09-09 ENCOUNTER — Ambulatory Visit: Payer: Medicare HMO

## 2016-09-09 DIAGNOSIS — D649 Anemia, unspecified: Secondary | ICD-10-CM | POA: Diagnosis not present

## 2016-09-09 DIAGNOSIS — I110 Hypertensive heart disease with heart failure: Secondary | ICD-10-CM | POA: Diagnosis not present

## 2016-09-09 DIAGNOSIS — C160 Malignant neoplasm of cardia: Secondary | ICD-10-CM | POA: Diagnosis not present

## 2016-09-09 DIAGNOSIS — J449 Chronic obstructive pulmonary disease, unspecified: Secondary | ICD-10-CM | POA: Diagnosis not present

## 2016-09-09 DIAGNOSIS — Z9221 Personal history of antineoplastic chemotherapy: Secondary | ICD-10-CM | POA: Diagnosis not present

## 2016-09-09 DIAGNOSIS — R918 Other nonspecific abnormal finding of lung field: Secondary | ICD-10-CM | POA: Diagnosis not present

## 2016-09-09 DIAGNOSIS — Z510 Encounter for antineoplastic radiation therapy: Secondary | ICD-10-CM | POA: Diagnosis not present

## 2016-09-09 DIAGNOSIS — Z95810 Presence of automatic (implantable) cardiac defibrillator: Secondary | ICD-10-CM | POA: Diagnosis not present

## 2016-09-09 DIAGNOSIS — R590 Localized enlarged lymph nodes: Secondary | ICD-10-CM | POA: Diagnosis not present

## 2016-09-10 ENCOUNTER — Ambulatory Visit: Payer: Medicare HMO

## 2016-09-10 ENCOUNTER — Ambulatory Visit
Admission: RE | Admit: 2016-09-10 | Discharge: 2016-09-10 | Disposition: A | Discharge status: Home / Self Care | Payer: Medicare HMO | Source: Ambulatory Visit | Attending: Radiation Oncology | Admitting: Radiation Oncology

## 2016-09-10 DIAGNOSIS — D649 Anemia, unspecified: Secondary | ICD-10-CM | POA: Diagnosis not present

## 2016-09-10 DIAGNOSIS — R590 Localized enlarged lymph nodes: Secondary | ICD-10-CM | POA: Diagnosis not present

## 2016-09-10 DIAGNOSIS — J449 Chronic obstructive pulmonary disease, unspecified: Secondary | ICD-10-CM | POA: Diagnosis not present

## 2016-09-10 DIAGNOSIS — I110 Hypertensive heart disease with heart failure: Secondary | ICD-10-CM | POA: Diagnosis not present

## 2016-09-10 DIAGNOSIS — C160 Malignant neoplasm of cardia: Secondary | ICD-10-CM | POA: Diagnosis not present

## 2016-09-10 DIAGNOSIS — Z9221 Personal history of antineoplastic chemotherapy: Secondary | ICD-10-CM | POA: Diagnosis not present

## 2016-09-10 DIAGNOSIS — R918 Other nonspecific abnormal finding of lung field: Secondary | ICD-10-CM | POA: Diagnosis not present

## 2016-09-10 DIAGNOSIS — Z95810 Presence of automatic (implantable) cardiac defibrillator: Secondary | ICD-10-CM | POA: Diagnosis not present

## 2016-09-10 DIAGNOSIS — Z510 Encounter for antineoplastic radiation therapy: Secondary | ICD-10-CM | POA: Diagnosis not present

## 2016-09-10 NOTE — Patient Instructions (Signed)

## 2016-09-11 ENCOUNTER — Ambulatory Visit: Payer: Medicare HMO

## 2016-09-12 ENCOUNTER — Ambulatory Visit: Payer: Medicare HMO

## 2016-09-15 ENCOUNTER — Ambulatory Visit: Payer: Medicare HMO

## 2016-09-16 ENCOUNTER — Encounter: Payer: Self-pay | Admitting: Radiation Oncology

## 2016-09-16 ENCOUNTER — Ambulatory Visit: Payer: Medicare HMO

## 2016-09-16 NOTE — Progress Notes (Signed)
  Radiation Oncology         [phone] ________________________________  Name: [name] MRN: [id number]  Date: 09/16/2016  DOB: 20-Mar-1935  End of Treatment Note  Diagnosis: Metastatic adenocarcinoma of the stomach    Indication for treatment:  Palliative       Radiation treatment dates:  08/21/16-09/10/16  Site/dose:  Stomach/ 37.5 Gy in 15 fractions  Beams/energy:   3D/ 6X  Narrative: The patient tolerated radiation treatment relatively well. Patient reported a 15 pound weight loss during treatment due to a liquid diet. He also reported 2-3 loose stools per day.  Plan: The patient has completed radiation treatment. The patient will return to radiation oncology clinic for routine followup in one month. I advised them to call or return sooner if they have any questions or concerns related to their recovery or treatment.  ------------------------------------------------  Jodelle Gross, MD, PhD  This document serves as a record of services personally performed by Kyung Rudd, MD. It was created on his behalf by Bethann Humble, a trained medical scribe. The creation of this record is based on the scribe's personal observations and the provider's statements to them. This document has been checked and approved by the attending provider.

## 2016-09-18 ENCOUNTER — Ambulatory Visit (HOSPITAL_BASED_OUTPATIENT_CLINIC_OR_DEPARTMENT_OTHER): Payer: Medicare HMO | Admitting: Nurse Practitioner

## 2016-09-18 ENCOUNTER — Ambulatory Visit: Payer: Medicare HMO

## 2016-09-18 ENCOUNTER — Other Ambulatory Visit (HOSPITAL_BASED_OUTPATIENT_CLINIC_OR_DEPARTMENT_OTHER): Payer: Medicare HMO

## 2016-09-18 ENCOUNTER — Telehealth: Payer: Self-pay | Admitting: Oncology

## 2016-09-18 VITALS — BP 119/55 | HR 71 | Temp 97.6°F | Resp 16 | Ht 74.0 in | Wt 209.4 lb

## 2016-09-18 DIAGNOSIS — R112 Nausea with vomiting, unspecified: Secondary | ICD-10-CM

## 2016-09-18 DIAGNOSIS — C169 Malignant neoplasm of stomach, unspecified: Secondary | ICD-10-CM

## 2016-09-18 DIAGNOSIS — C162 Malignant neoplasm of body of stomach: Secondary | ICD-10-CM | POA: Diagnosis not present

## 2016-09-18 DIAGNOSIS — J449 Chronic obstructive pulmonary disease, unspecified: Secondary | ICD-10-CM

## 2016-09-18 DIAGNOSIS — Z95828 Presence of other vascular implants and grafts: Secondary | ICD-10-CM

## 2016-09-18 LAB — CBC WITH DIFFERENTIAL/PLATELET
BASO%: 0.2 % (ref 0.0–2.0)
BASOS ABS: 0 10*3/uL (ref 0.0–0.1)
EOS ABS: 0.1 10*3/uL (ref 0.0–0.5)
EOS%: 1.3 % (ref 0.0–7.0)
HEMATOCRIT: 34.3 % — AB (ref 38.4–49.9)
HGB: 10.7 g/dL — ABNORMAL LOW (ref 13.0–17.1)
LYMPH#: 0.3 10*3/uL — AB (ref 0.9–3.3)
LYMPH%: 4.6 % — ABNORMAL LOW (ref 14.0–49.0)
MCH: 26.8 pg — ABNORMAL LOW (ref 27.2–33.4)
MCHC: 31.2 g/dL — AB (ref 32.0–36.0)
MCV: 85.8 fL (ref 79.3–98.0)
MONO#: 0.5 10*3/uL (ref 0.1–0.9)
MONO%: 9.2 % (ref 0.0–14.0)
NEUT#: 4.6 10*3/uL (ref 1.5–6.5)
NEUT%: 84.7 % — AB (ref 39.0–75.0)
Platelets: 121 10*3/uL — ABNORMAL LOW (ref 140–400)
RBC: 4 10*6/uL — ABNORMAL LOW (ref 4.20–5.82)
RDW: 18.8 % — ABNORMAL HIGH (ref 11.0–14.6)
WBC: 5.5 10*3/uL (ref 4.0–10.3)

## 2016-09-18 MED ORDER — SODIUM CHLORIDE 0.9 % IJ SOLN
10.0000 mL | INTRAMUSCULAR | Status: DC | PRN
  Administered 2016-09-18: 10 mL via INTRAVENOUS
  Filled 2016-09-18: qty 10

## 2016-09-18 NOTE — Telephone Encounter (Signed)
Gave patient AVS and calender per 5/17 los. - [facility] to contact patient with Ct schedule.

## 2016-09-18 NOTE — Patient Instructions (Signed)

## 2016-09-18 NOTE — Progress Notes (Addendum)
[facility] OFFICE PROGRESS NOTE   Diagnosis:  Gastric cancer  INTERVAL HISTORY:   [name] returns as scheduled. He completed radiation 09/10/2016. 5-FU pump was discontinued 09/10/2016. He overall is feeling well. He has a good appetite. He is slowly advancing his diet. He had a few episodes of nausea with vomiting last week. He denies any bleeding. The skin surrounding the Port-A-Cath site is healing.  Objective:  Vital signs in last 24 hours:  Blood pressure (!) 119/55, pulse 71, temperature 97.6 F (36.4 C), temperature source Oral, resp. rate 16, height '6\' 2"'$  (1.88 m), weight 209 lb 6.4 oz (95 kg), SpO2 100 %.    HEENT: No thrush or ulcers. Lymphatics: No palpable cervical or supra-clavicular lymph nodes. Resp: Lungs clear bilaterally. Cardio: Regular rate and rhythm. GI: Abdomen soft and nontender. No hepatomegaly. Vascular: No leg edema. Skin: Port-A-Cath site is without erythema. Skin surrounding the Port-A-Cath is hyperpigmented. No skin breakdown.    Lab Results:  Lab Results  Component Value Date   WBC 5.5 09/18/2016   HGB 10.7 (L) 09/18/2016   HCT 34.3 (L) 09/18/2016   MCV 85.8 09/18/2016   PLT 121 (L) 09/18/2016   NEUTROABS 4.6 09/18/2016    Imaging:  No results found.  Medications: I have reviewed the patient's current medications.  Assessment/Plan: 1. Gastric Cancer  Lesser curvature gastric body mass noted on endoscopy 06/27/2015, biopsy confirmed adenocarcinoma  Staging CT scans to 06/28/2015 with a gastric body mass, adenopathy adjacent to the stomach, chest adenopathy, a single enhancing liver lesion, and indeterminate nodular pulmonary lesions  PET scan 07/12/2015-hypermetabolic gastric mass, gastrohepatic ligament metastases, metastasis at the left adrenal gland, hypermetabolic right paratracheal, right hilar, and subcarinal nodes moderate metabolic activity involving 2 pulmonary lesions  Cycle 1 FOLFOX  07/19/2015  Cycle 2 FOLFOX 08/02/2015  Cycle 3 FOLFOX 08/23/2015 with Neulasta support  Cycle 4 FOLFOX 09/06/2015 with Neulasta support  Cycle 5 FOLFOX 09/20/2015 with Neulasta support  Restaging CTs 10/02/2015-decrease in chest and perigastric lymphadenopathy, decrease thickening at the distal stomach, 11 mm sclerotic lesion in the posterior left third rib-barely visible on the PET/CT 07/12/2015  Cycle 6 FOLFOX 10/04/2015  Cycle 7 FOLFOX 10/18/2015  Cycle 8 FOLFOX 11/08/2015 (oxaliplatin held due to neuropathy)  Cycle 9 FOLFOX 11/22/2015 (oxaliplatin held secondary to neuropathy)  Cycle 10 FOLFOX 12/06/2015 (oxaliplatin held secondary to neuropathy)  CTs 12/24/2015-stable lung nodules and mediastinal lymph nodes, no evidence of disease progression  Upper endoscopy 08/13/2016 showed a large ulcerated circumferential mass with no bleeding in the gastric antrum and at the pylorus causing partial outlet obstruction.  Initiation of infusional 5-FU and radiation 08/21/2016; completed 09/10/2016  2. COPD  3. Nonischemic cardiomyopathy  4. Implanted cardiac defibrillator  5. History of prostate cancer  6. Hypertension  7. History of Iron deficiency anemia-hemoglobin lower 07/22/2016  8. Neutropenia secondary to chemotherapy-Neulasta added with cycle 3 FOLFOX 08/23/2015  9. 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 Anemia secondary to blood loss related to #1. Blood transfusion 08/12/2016,08/16/2016. Hemoglobin improved 09/04/2016. Hemoglobin stable 09/18/2016.  12. 08/28/2016-hyperpigmentation with skin induration surrounding the Port-A-Cath, most likely a tape or skin cleanser reaction; 09/18/2016 the skin has healed; marked hyperpigmentation persists.   Disposition: [name] appears stable. He has completed the course of infusional 5-FU and concurrent radiation. Hemoglobin remains stable.  We are referring  him for restaging CT scans in approximately 4 weeks. Labs the same day as the CT scan.  He will  continue to slowly advance his diet.  He will return for a follow-up visit on 10/23/2016 to review the CT results. He will contact the office in the interim with any problems.  Patient seen with Dr. [name].  Ned Card ANP/GNP-BC   09/18/2016  12:26 PM  This was a shared visit with Ned Card. [name] was interviewed and examined. The right chest wall inflammation has resolved. This was most likely related to a reaction to the cleaning solution.  No further bleeding. He has completed the course of palliative radiation and 5-fluorouracil.  He will advance his diet as tolerated. He will return for an office visit and restaging CT evaluation in approximately 4 weeks.  Julieanne Manson, M.D.

## 2016-10-03 ENCOUNTER — Telehealth: Payer: Self-pay

## 2016-10-03 NOTE — Telephone Encounter (Signed)
Pt called b/c he has not been scheduled for CT CAP yet. It is due before his next appt with Dr [name].  inbasket sent to [facility] for prior auth.  Pt currently has lab/flush on 6/18 and Dr [name] on 6/21.

## 2016-10-03 NOTE — Telephone Encounter (Signed)
Notified central scheduling that scan is authorized. Requested they contact pt to schedule.

## 2016-10-06 ENCOUNTER — Telehealth: Payer: Self-pay

## 2016-10-06 ENCOUNTER — Other Ambulatory Visit: Payer: Self-pay | Admitting: *Deleted

## 2016-10-06 NOTE — Telephone Encounter (Signed)
Called central scheduling and they are in process of scheduling pt.

## 2016-10-06 NOTE — Telephone Encounter (Signed)
-----   Message from Loletta Parish sent at 10/03/2016  1:23 PM EDT ----- Regarding: RE: prior auth Scans authorized.  Thanks  ----- Message ----- From: Janace Hoard, RN Sent: 10/03/2016  11:47 AM To: [place], RN, Brien Few, RN, # Subject: prior Josem Kaufmann                                     Please do PA for CT CAP. Thanks.

## 2016-10-06 NOTE — Addendum Note (Signed)
Addendum  created 10/06/16 1309 by Myrtie Soman, MD   Sign clinical note

## 2016-10-16 ENCOUNTER — Other Ambulatory Visit: Payer: Self-pay | Admitting: Internal Medicine

## 2016-10-16 DIAGNOSIS — Z794 Long term (current) use of insulin: Secondary | ICD-10-CM

## 2016-10-16 DIAGNOSIS — I5022 Chronic systolic (congestive) heart failure: Secondary | ICD-10-CM

## 2016-10-16 DIAGNOSIS — E118 Type 2 diabetes mellitus with unspecified complications: Secondary | ICD-10-CM

## 2016-10-16 DIAGNOSIS — I10 Essential (primary) hypertension: Secondary | ICD-10-CM

## 2016-10-20 ENCOUNTER — Ambulatory Visit: Payer: Medicare HMO

## 2016-10-20 ENCOUNTER — Ambulatory Visit (HOSPITAL_COMMUNITY)
Admission: RE | Admit: 2016-10-20 | Discharge: 2016-10-20 | Disposition: A | Discharge status: Home / Self Care | Payer: Medicare HMO | Source: Ambulatory Visit | Attending: Nurse Practitioner | Admitting: Nurse Practitioner

## 2016-10-20 ENCOUNTER — Other Ambulatory Visit (HOSPITAL_BASED_OUTPATIENT_CLINIC_OR_DEPARTMENT_OTHER): Payer: Medicare HMO

## 2016-10-20 ENCOUNTER — Encounter (HOSPITAL_COMMUNITY): Payer: Self-pay

## 2016-10-20 ENCOUNTER — Other Ambulatory Visit: Payer: Self-pay | Admitting: *Deleted

## 2016-10-20 DIAGNOSIS — C169 Malignant neoplasm of stomach, unspecified: Secondary | ICD-10-CM | POA: Insufficient documentation

## 2016-10-20 DIAGNOSIS — R918 Other nonspecific abnormal finding of lung field: Secondary | ICD-10-CM | POA: Diagnosis not present

## 2016-10-20 DIAGNOSIS — I2510 Atherosclerotic heart disease of native coronary artery without angina pectoris: Secondary | ICD-10-CM | POA: Insufficient documentation

## 2016-10-20 DIAGNOSIS — C162 Malignant neoplasm of body of stomach: Secondary | ICD-10-CM

## 2016-10-20 DIAGNOSIS — I700 Atherosclerosis of aorta: Secondary | ICD-10-CM | POA: Diagnosis not present

## 2016-10-20 DIAGNOSIS — K5730 Diverticulosis of large intestine without perforation or abscess without bleeding: Secondary | ICD-10-CM | POA: Diagnosis not present

## 2016-10-20 DIAGNOSIS — R911 Solitary pulmonary nodule: Secondary | ICD-10-CM | POA: Diagnosis not present

## 2016-10-20 DIAGNOSIS — Z95828 Presence of other vascular implants and grafts: Secondary | ICD-10-CM

## 2016-10-20 DIAGNOSIS — C160 Malignant neoplasm of cardia: Secondary | ICD-10-CM

## 2016-10-20 LAB — COMPREHENSIVE METABOLIC PANEL
ALBUMIN: 3.1 g/dL — AB (ref 3.5–5.0)
ALK PHOS: 117 U/L (ref 40–150)
ALT: 27 U/L (ref 0–55)
ANION GAP: 6 meq/L (ref 3–11)
AST: 35 U/L — ABNORMAL HIGH (ref 5–34)
BUN: 10.8 mg/dL (ref 7.0–26.0)
CALCIUM: 9.2 mg/dL (ref 8.4–10.4)
CO2: 26 mEq/L (ref 22–29)
Chloride: 106 mEq/L (ref 98–109)
Creatinine: 1.1 mg/dL (ref 0.7–1.3)
EGFR: 78 mL/min/{1.73_m2} — AB (ref >90)
Glucose: 100 mg/dl (ref 70–140)
POTASSIUM: 4.3 meq/L (ref 3.5–5.1)
Sodium: 138 mEq/L (ref 136–145)
Total Bilirubin: 0.28 mg/dL (ref 0.20–1.20)
Total Protein: 7 g/dL (ref 6.4–8.3)

## 2016-10-20 LAB — CBC WITH DIFFERENTIAL/PLATELET
BASO%: 0.4 % (ref 0.0–2.0)
Basophils Absolute: 0 10*3/uL (ref 0.0–0.1)
EOS ABS: 0.1 10*3/uL (ref 0.0–0.5)
EOS%: 1.2 % (ref 0.0–7.0)
HCT: 36.7 % — ABNORMAL LOW (ref 38.4–49.9)
HEMOGLOBIN: 11.8 g/dL — AB (ref 13.0–17.1)
LYMPH%: 8 % — ABNORMAL LOW (ref 14.0–49.0)
MCH: 26.8 pg — ABNORMAL LOW (ref 27.2–33.4)
MCHC: 32.2 g/dL (ref 32.0–36.0)
MCV: 83.2 fL (ref 79.3–98.0)
MONO#: 0.7 10*3/uL (ref 0.1–0.9)
MONO%: 12.3 % (ref 0.0–14.0)
NEUT%: 78.1 % — ABNORMAL HIGH (ref 39.0–75.0)
NEUTROS ABS: 4.2 10*3/uL (ref 1.5–6.5)
PLATELETS: 145 10*3/uL (ref 140–400)
RBC: 4.41 10*6/uL (ref 4.20–5.82)
RDW: 21.1 % — AB (ref 11.0–14.6)
WBC: 5.3 10*3/uL (ref 4.0–10.3)
lymph#: 0.4 10*3/uL — ABNORMAL LOW (ref 0.9–3.3)

## 2016-10-20 MED ORDER — HEPARIN SOD (PORK) LOCK FLUSH 100 UNIT/ML IV SOLN
INTRAVENOUS | Status: AC
  Filled 2016-10-20: qty 5

## 2016-10-20 MED ORDER — IOPAMIDOL (ISOVUE-300) INJECTION 61%
INTRAVENOUS | Status: AC
  Filled 2016-10-20: qty 100

## 2016-10-20 MED ORDER — HEPARIN SOD (PORK) LOCK FLUSH 100 UNIT/ML IV SOLN
500.0000 [IU] | Freq: Once | INTRAVENOUS | Status: AC
  Administered 2016-10-20: 500 [IU] via INTRAVENOUS

## 2016-10-20 MED ORDER — SODIUM CHLORIDE 0.9 % IJ SOLN
10.0000 mL | INTRAMUSCULAR | Status: DC | PRN
  Administered 2016-10-20: 10 mL via INTRAVENOUS
  Filled 2016-10-20: qty 10

## 2016-10-20 MED ORDER — PROCHLORPERAZINE MALEATE 10 MG PO TABS: 10.0000 mg | ORAL_TABLET | Freq: Four times a day (QID) | ORAL | 0 refills | Status: DC | PRN

## 2016-10-20 MED ORDER — IOPAMIDOL (ISOVUE-300) INJECTION 61%
100.0000 mL | Freq: Once | INTRAVENOUS | Status: AC | PRN
  Administered 2016-10-20: 100 mL via INTRAVENOUS

## 2016-10-20 MED ORDER — IOPAMIDOL (ISOVUE-370) INJECTION 76%
INTRAVENOUS | Status: AC
  Filled 2016-10-20: qty 100

## 2016-10-23 ENCOUNTER — Telehealth: Payer: Self-pay | Admitting: Oncology

## 2016-10-23 ENCOUNTER — Ambulatory Visit (HOSPITAL_BASED_OUTPATIENT_CLINIC_OR_DEPARTMENT_OTHER): Payer: Medicare HMO | Admitting: Oncology

## 2016-10-23 VITALS — BP 145/64 | HR 58 | Temp 97.9°F | Resp 18 | Ht 74.0 in | Wt 221.7 lb

## 2016-10-23 DIAGNOSIS — C162 Malignant neoplasm of body of stomach: Secondary | ICD-10-CM | POA: Diagnosis not present

## 2016-10-23 DIAGNOSIS — C7972 Secondary malignant neoplasm of left adrenal gland: Secondary | ICD-10-CM

## 2016-10-23 DIAGNOSIS — D630 Anemia in neoplastic disease: Secondary | ICD-10-CM | POA: Diagnosis not present

## 2016-10-23 DIAGNOSIS — G620 Drug-induced polyneuropathy: Secondary | ICD-10-CM | POA: Diagnosis not present

## 2016-10-23 DIAGNOSIS — Z8546 Personal history of malignant neoplasm of prostate: Secondary | ICD-10-CM

## 2016-10-23 DIAGNOSIS — C169 Malignant neoplasm of stomach, unspecified: Secondary | ICD-10-CM

## 2016-10-23 NOTE — Progress Notes (Signed)
[facility] OFFICE PROGRESS NOTE   Diagnosis: Gastric cancer  INTERVAL HISTORY:   [name] returns as scheduled. He continues to have neuropathy symptoms, though this has improved. Good appetite. He has chronic exertional dyspnea. No bleeding  Objective:  Vital signs in last 24 hours:  Blood pressure (!) 145/64, pulse (!) 58, temperature 97.9 F (36.6 C), temperature source Oral, resp. rate 18, height 6\' 2"  (1.88 m), weight 221 lb 11.2 oz (100.6 kg), SpO2 95 %.    HEENT: Neck without mass Lymphatics: No cervical, supraclavicular, or axillary nodes Resp: Lungs clear bilaterally Cardio: Regular rate and rhythm GI: No hepatosplenomegaly Vascular: No leg edema    Portacath/PICC-without erythema  Lab Results:  Lab Results  Component Value Date   WBC 5.3 10/20/2016   HGB 11.8 (L) 10/20/2016   HCT 36.7 (L) 10/20/2016   MCV 83.2 10/20/2016   PLT 145 10/20/2016   NEUTROABS 4.2 10/20/2016    CMP     Component Value Date/Time   NA 138 10/20/2016 0941   K 4.3 10/20/2016 0941   CL 105 02/18/2016 1351   CO2 26 10/20/2016 0941   GLUCOSE 100 10/20/2016 0941   BUN 10.8 10/20/2016 0941   CREATININE 1.1 10/20/2016 0941   CALCIUM 9.2 10/20/2016 0941   PROT 7.0 10/20/2016 0941   ALBUMIN 3.1 (L) 10/20/2016 0941   AST 35 (H) 10/20/2016 0941   ALT 27 10/20/2016 0941   ALKPHOS 117 10/20/2016 0941   BILITOT 0.28 10/20/2016 0941   GFRNONAA >60 07/11/2015 1035   GFRAA >60 07/11/2015 1035   Imaging:  Ct Chest W Contrast  Result Date: 10/20/2016 CLINICAL DATA:  Gastric cancer diagnosed 1/17 with chemotherapy and radiation therapy completed. Prostate cancer 8 years ago with prostatectomy. EXAM: CT CHEST, ABDOMEN, AND PELVIS WITH CONTRAST TECHNIQUE: Multidetector CT imaging of the chest, abdomen and pelvis was performed following the standard protocol during bolus administration of intravenous contrast. CONTRAST:  125mL ISOVUE-300 IOPAMIDOL (ISOVUE-300) INJECTION  61% COMPARISON:  12/24/2015.  Chest radiograph 08/12/2016. FINDINGS: CT CHEST FINDINGS Cardiovascular: Aortic and branch vessel atherosclerosis. Normal heart size, without pericardial effusion. Pacer/AICD device. No central pulmonary embolism, on this non-dedicated study. Right-sided Port-A-Cath terminates at the high right atrium. Mediastinum/Nodes: No supraclavicular adenopathy. No mediastinal adenopathy. A right hilar node measures 1.4 cm today and is similar 1.5 cm on the prior. Lungs/Pleura: No pleural fluid. Centrilobular and paraseptal emphysema. Pleural-based right upper lobe pulmonary nodule measures 2.3 cm on image 39/series 4 versus 1.0 cm on 12/24/2015. Right lower lobe relatively well-circumscribed nodule measures 8 mm on image 136/series 4 and is unchanged. Somewhat irregular central right upper lobe 7 mm nodule is unchanged on image 49/ series 4. Other smaller pulmonary nodules, maximally 3-4 mm, are unchanged. Musculoskeletal: No acute osseous abnormality. Similar sclerotic lesion in the posterior left third rib. CT ABDOMEN PELVIS FINDINGS Hepatobiliary: r similar hypervascular focus in the left lobe of the liver at 10 mm Normal gallbladder, without biliary ductal dilatation. Pancreas: Normal, without mass or ductal dilatation. Spleen: Normal in size, without focal abnormality. Adrenals/Urinary Tract: Normal adrenal glands. Interpolar right renal cyst. Normal left kidney, without hydronephrosis. Normal urinary bladder. Stomach/Bowel: No residual gastric mass identified. Mild underdistention in the antrum. Scattered colonic diverticula. Normal terminal ileum and appendix. Normal small bowel. Vascular/Lymphatic: Aortic and branch vessel atherosclerosis. No typical findings of abdominal adenopathy. A left suprarenal nodule measures 6 mm on image 65/series 2 and is present in 2010. This can be presumed benign. No pelvic sidewall  adenopathy. Reproductive: Prostatectomy. Other: No significant free fluid.  Fat containing right inguinal hernia. No evidence of omental or peritoneal disease. Musculoskeletal: No acute osseous abnormality. IMPRESSION: 1. Significant enlargement of a pleural-based right upper lobe pulmonary nodule. Given relative stability of other pulmonary nodules, this may represent a metachronous primary bronchogenic carcinoma. Isolated progression of pulmonary metastasis could look similar. Consider sampling. 2. No change in nonspecific borderline right hilar adenopathy. 3. No acute process or evidence of metastatic disease in the abdomen or pelvis. 4. Similar left hepatic lobe hypervascular focus, favoring a hemangioma. 5. similar sclerotic lesion in the posterolateral left third rib. Electronically Signed   By: Abigail Miyamoto M.D.   On: 10/20/2016 15:21   Ct Abdomen Pelvis W Contrast  Result Date: 10/20/2016 CLINICAL DATA:  Gastric cancer diagnosed 1/17 with chemotherapy and radiation therapy completed. Prostate cancer 8 years ago with prostatectomy. EXAM: CT CHEST, ABDOMEN, AND PELVIS WITH CONTRAST TECHNIQUE: Multidetector CT imaging of the chest, abdomen and pelvis was performed following the standard protocol during bolus administration of intravenous contrast. CONTRAST:  117mL ISOVUE-300 IOPAMIDOL (ISOVUE-300) INJECTION 61% COMPARISON:  12/24/2015.  Chest radiograph 08/12/2016. FINDINGS: CT CHEST FINDINGS Cardiovascular: Aortic and branch vessel atherosclerosis. Normal heart size, without pericardial effusion. Pacer/AICD device. No central pulmonary embolism, on this non-dedicated study. Right-sided Port-A-Cath terminates at the high right atrium. Mediastinum/Nodes: No supraclavicular adenopathy. No mediastinal adenopathy. A right hilar node measures 1.4 cm today and is similar 1.5 cm on the prior. Lungs/Pleura: No pleural fluid. Centrilobular and paraseptal emphysema. Pleural-based right upper lobe pulmonary nodule measures 2.3 cm on image 39/series 4 versus 1.0 cm on 12/24/2015. Right lower  lobe relatively well-circumscribed nodule measures 8 mm on image 136/series 4 and is unchanged. Somewhat irregular central right upper lobe 7 mm nodule is unchanged on image 49/ series 4. Other smaller pulmonary nodules, maximally 3-4 mm, are unchanged. Musculoskeletal: No acute osseous abnormality. Similar sclerotic lesion in the posterior left third rib. CT ABDOMEN PELVIS FINDINGS Hepatobiliary: r similar hypervascular focus in the left lobe of the liver at 10 mm Normal gallbladder, without biliary ductal dilatation. Pancreas: Normal, without mass or ductal dilatation. Spleen: Normal in size, without focal abnormality. Adrenals/Urinary Tract: Normal adrenal glands. Interpolar right renal cyst. Normal left kidney, without hydronephrosis. Normal urinary bladder. Stomach/Bowel: No residual gastric mass identified. Mild underdistention in the antrum. Scattered colonic diverticula. Normal terminal ileum and appendix. Normal small bowel. Vascular/Lymphatic: Aortic and branch vessel atherosclerosis. No typical findings of abdominal adenopathy. A left suprarenal nodule measures 6 mm on image 65/series 2 and is present in 2010. This can be presumed benign. No pelvic sidewall adenopathy. Reproductive: Prostatectomy. Other: No significant free fluid. Fat containing right inguinal hernia. No evidence of omental or peritoneal disease. Musculoskeletal: No acute osseous abnormality. IMPRESSION: 1. Significant enlargement of a pleural-based right upper lobe pulmonary nodule. Given relative stability of other pulmonary nodules, this may represent a metachronous primary bronchogenic carcinoma. Isolated progression of pulmonary metastasis could look similar. Consider sampling. 2. No change in nonspecific borderline right hilar adenopathy. 3. No acute process or evidence of metastatic disease in the abdomen or pelvis. 4. Similar left hepatic lobe hypervascular focus, favoring a hemangioma. 5. similar sclerotic lesion in the  posterolateral left third rib. Electronically Signed   By: Abigail Miyamoto M.D.   On: 10/20/2016 15:21    Medications: I have reviewed the patient's current medications.  Assessment/Plan: . Gastric Cancer  Lesser curvature gastric body mass noted on endoscopy 06/27/2015, biopsy confirmed adenocarcinoma  Staging  CT scans to 06/28/2015 with a gastric body mass, adenopathy adjacent to the stomach, chest adenopathy, a single enhancing liver lesion, and indeterminate nodular pulmonary lesions  PET scan 07/12/2015-hypermetabolic gastric mass, gastrohepatic ligament metastases, metastasis at the left adrenal gland, hypermetabolic right paratracheal, right hilar, and subcarinal nodes moderate metabolic activity involving 2 pulmonary lesions  Cycle 1 FOLFOX 07/19/2015  Cycle 2 FOLFOX 08/02/2015  Cycle 3 FOLFOX 08/23/2015 with Neulasta support  Cycle 4 FOLFOX 09/06/2015 with Neulasta support  Cycle 5 FOLFOX 09/20/2015 with Neulasta support  Restaging CTs 10/02/2015-decrease in chest and perigastric lymphadenopathy, decrease thickening at the distal stomach, 11 mm sclerotic lesion in the posterior left third rib-barely visible on the PET/CT 07/12/2015  Cycle 6 FOLFOX 10/04/2015  Cycle 7 FOLFOX 10/18/2015  Cycle 8 FOLFOX 11/08/2015 (oxaliplatin held due to neuropathy)  Cycle 9 FOLFOX 11/22/2015 (oxaliplatin held secondary to neuropathy)  Cycle 10 FOLFOX 12/06/2015 (oxaliplatin held secondary to neuropathy)  CTs 12/24/2015-stable lung nodules and mediastinal lymph nodes, no evidence of disease progression  Upper endoscopy 08/13/2016 showed a large ulcerated circumferential mass with no bleeding in the gastric antrum and at the pylorus causing partial outlet obstruction.  Initiation of infusional 5-FU and radiation 08/21/2016; completed 09/10/2016  CTs 10/20/2016-enlargement of a pleural-based right upper lobe nodule,, other lung nodules are unchanged, no evidence of metastatic disease in  the abdomen/pelvis  2. COPD  3. Nonischemic cardiomyopathy  4. Implanted cardiac defibrillator  5. History of prostate cancer  6. Hypertension  7. History of Iron deficiency anemia-hemoglobin lower03/20/2018  8. Neutropenia secondary to chemotherapy-Neulasta added with cycle 3 FOLFOX 08/23/2015  9. 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 Anemia secondary to blood loss related to #1. Blood transfusion 08/12/2016,08/16/2016.Hemoglobin improved 09/04/2016. Hemoglobin stable 09/18/2016.  12. 08/28/2016-hyperpigmentation with skin induration surrounding the Port-A-Cath, most likely a tape or skin cleanser reaction; 09/18/2016 the skin has healed; marked hyperpigmentation persists.    Disposition:  [name] appears stable. There is no clinical evidence for progression of the gastric cancer. The hemoglobin has stabilized following the palliative 5-FU/radiation.  The restaging CT reveals enlargement of a right upper lobe nodule and no other evidence of progressive disease. The right lung lesion is most likely a primary lung cancer as opposed to a metastatic focus, though this is possible.  I discussed diagnostic and treatment options with [name] and his family. He may be a candidate for SBRT to the right lung mass. We decided against referral for a biopsy. The plan is to continue observation with a follow-up CT in 2 months.  25 minutes were spent with the patient today. The majority of the time was used for counseling and coordination of care.  Donneta Romberg, MD  10/23/2016  10:52 AM

## 2016-10-23 NOTE — Telephone Encounter (Signed)
Scheduled appt per 6/21 los - Gave patient  AVS and calender per los. [facility] to schedule appt with patient for CT.

## 2016-10-24 ENCOUNTER — Other Ambulatory Visit: Payer: Self-pay | Admitting: Oncology

## 2016-10-24 ENCOUNTER — Other Ambulatory Visit: Payer: Self-pay | Admitting: *Deleted

## 2016-10-24 DIAGNOSIS — C169 Malignant neoplasm of stomach, unspecified: Secondary | ICD-10-CM

## 2016-10-24 DIAGNOSIS — C160 Malignant neoplasm of cardia: Secondary | ICD-10-CM

## 2016-10-24 MED ORDER — PROCHLORPERAZINE MALEATE 10 MG PO TABS: 10.0000 mg | ORAL_TABLET | Freq: Four times a day (QID) | ORAL | 0 refills | Status: DC | PRN

## 2016-10-30 ENCOUNTER — Ambulatory Visit
Admission: RE | Admit: 2016-10-30 | Discharge: 2016-10-30 | Disposition: A | Discharge status: Home / Self Care | Payer: Medicare HMO | Source: Ambulatory Visit | Attending: Radiation Oncology | Admitting: Radiation Oncology

## 2016-10-30 ENCOUNTER — Encounter: Payer: Self-pay | Admitting: Radiation Oncology

## 2016-10-30 VITALS — BP 147/75 | HR 70 | Temp 97.6°F | Resp 20 | Ht 74.0 in | Wt 222.0 lb

## 2016-10-30 DIAGNOSIS — I5022 Chronic systolic (congestive) heart failure: Secondary | ICD-10-CM | POA: Diagnosis not present

## 2016-10-30 DIAGNOSIS — I428 Other cardiomyopathies: Secondary | ICD-10-CM | POA: Diagnosis not present

## 2016-10-30 DIAGNOSIS — Z8546 Personal history of malignant neoplasm of prostate: Secondary | ICD-10-CM | POA: Insufficient documentation

## 2016-10-30 DIAGNOSIS — Z79899 Other long term (current) drug therapy: Secondary | ICD-10-CM | POA: Insufficient documentation

## 2016-10-30 DIAGNOSIS — I110 Hypertensive heart disease with heart failure: Secondary | ICD-10-CM | POA: Insufficient documentation

## 2016-10-30 DIAGNOSIS — C3411 Malignant neoplasm of upper lobe, right bronchus or lung: Secondary | ICD-10-CM | POA: Insufficient documentation

## 2016-10-30 DIAGNOSIS — Z95810 Presence of automatic (implantable) cardiac defibrillator: Secondary | ICD-10-CM | POA: Insufficient documentation

## 2016-10-30 DIAGNOSIS — E785 Hyperlipidemia, unspecified: Secondary | ICD-10-CM | POA: Diagnosis not present

## 2016-10-30 DIAGNOSIS — J449 Chronic obstructive pulmonary disease, unspecified: Secondary | ICD-10-CM | POA: Insufficient documentation

## 2016-10-30 DIAGNOSIS — Z510 Encounter for antineoplastic radiation therapy: Secondary | ICD-10-CM | POA: Diagnosis not present

## 2016-10-30 DIAGNOSIS — Z923 Personal history of irradiation: Secondary | ICD-10-CM | POA: Diagnosis not present

## 2016-10-30 DIAGNOSIS — I447 Left bundle-branch block, unspecified: Secondary | ICD-10-CM | POA: Insufficient documentation

## 2016-10-30 DIAGNOSIS — Z85028 Personal history of other malignant neoplasm of stomach: Secondary | ICD-10-CM | POA: Insufficient documentation

## 2016-10-30 DIAGNOSIS — Z87891 Personal history of nicotine dependence: Secondary | ICD-10-CM | POA: Insufficient documentation

## 2016-10-30 DIAGNOSIS — Z86010 Personal history of colonic polyps: Secondary | ICD-10-CM | POA: Diagnosis not present

## 2016-10-30 NOTE — Progress Notes (Signed)
Please see the Nurse Progress Note in the MD Initial Consult Encounter for this patient. 

## 2016-10-30 NOTE — Progress Notes (Addendum)
Reconsult Metastatic adenocarcinoma of the stomach progression  Radiation 08/21/16-09/10/16 37.5 Gy in 15 fractions  Now per Dr. [name] note on 10/23/16  Possible SBRT  radiation to Right upper lobe of lung,  no biopsy referral, continue to observe and  Follow up CT in 2 months CT 10/20/16, Chest/abdomen pelvis: IMPRESSION: 1. Significant enlargement of a pleural-based right upper lobe pulmonary nodule. Given relative stability of other pulmonary nodules, this may represent a metachronous primary bronchogenic carcinoma. Isolated progression of pulmonary metastasis could look similar. Consider sampling. 2. No change in nonspecific borderline right hilar adenopathy. 3. No acute process or evidence of metastatic disease in the abdomen or pelvis. 4. Similar left hepatic lobe hypervascular focus, favoring a hemangioma. 5. similar sclerotic lesion in the posterolateral left third rib.  Vitals and weight  Wnl, 98% room air, does get short of breath with exertion, occasional non productive cough, abdominal gas occasionally stated,  Fatigued at times, appetite good, no nausea,  No pain  BP (!) 147/75 Comment: left arm  Pulse 70   Temp 97.6 F (36.4 C) (Oral)   Resp 20   Ht 6\' 2"  (1.88 m)   Wt 222 lb (100.7 kg)   SpO2 98% Comment: room air  BMI 28.50 kg/m   Wt Readings from Last 3 Encounters:  10/30/16 222 lb (100.7 kg)  10/23/16 221 lb 11.2 oz (100.6 kg)  09/18/16 209 lb 6.4 oz (95 kg)

## 2016-10-30 NOTE — Progress Notes (Signed)
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   HISTORY OF PRESENT ILLNESS::[name] is a 82 y.o. male who is seen for reconsult for metastatic adenocarcinoma of the stomach. Gastric cancer was diagnosed in 1/17 with chemotherapy and radiation therapy completed. Prostate cancer was also treated 8 years ago with prostatectomy. The patient was seen by Dr. Benay Moran on 10/23/16 for f/u. Restaging CT on 10/20/16 revealed enlargement of a Moran upper lobe nodule and no other evidence of progressive disease. The patient was referred to radiation oncology as he may be a candidate for SBRT to the Moran lung mass.  Dr. Benay Moran saw no clinical evidence for progression of gastric cancer. The hemoglobin has stabilized following palliative chemotherapy and radiation treatments.  On review of systems, patient endorses gas pains but states these dissipate. He endorses an improved energy level. He denies bloody, tarry stools. He endorses good breathing unless walking up an incline. He denies coughing, but notes having medicine if this were to become an issue.   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     PAST SURGICAL HISTORY: Past Surgical History:  Procedure Laterality Date  . BI-VENTRICULAR IMPLANTABLE CARDIOVERTER DEFIBRILLATOR N/A 07/17/2014   STJ CRTD implanted by Dr Lovena Le  . CATARACT EXTRACTION W/ INTRAOCULAR LENS  IMPLANT, BILATERAL Bilateral 2010  . COLONOSCOPY    . ESOPHAGOGASTRODUODENOSCOPY (EGD) WITH PROPOFOL N/A 06/27/2015   Procedure: ESOPHAGOGASTRODUODENOSCOPY (EGD) WITH PROPOFOL;  Surgeon: Manus Gunning, MD;  Location: [facility];  Service: Gastroenterology;  Laterality: N/A;  . ESOPHAGOGASTRODUODENOSCOPY (EGD) WITH PROPOFOL N/A 08/13/2016   Procedure: ESOPHAGOGASTRODUODENOSCOPY (EGD) WITH PROPOFOL;  Surgeon: Manus Gunning, MD;  Location: [facility];  Service: Gastroenterology;  Laterality: N/A;  . LEFT HEART CATHETERIZATION WITH CORONARY ANGIOGRAM N/A 01/04/2014   no obstructive CAD  . PORTACATH PLACEMENT N/A 07/11/2015   Procedure: INSERTION PORT-A-CATH WITH ULTRA SOUND GUIDANCE;  Surgeon: Stark Klein, MD;  Location: [facility];  Service: General;  Laterality: N/A;  . PROSTATECTOMY    . ROBOT ASSISTED LAPAROSCOPIC RADICAL PROSTATECTOMY  2010     FAMILY HISTORY: family history includes Arthritis in his other; Esophagitis in his mother; Hypertension in his father and mother; Prostate cancer in his father; Stroke in his mother.   SOCIAL HISTORY:  reports that he quit smoking about 14 years ago. His smoking use included Cigarettes. He has a 60.00 pack-year smoking history. He has never used smokeless tobacco. He reports that he does not drink alcohol or use drugs.   ALLERGIES: Adhesive [tape] and Other   MEDICATIONS:  Current Outpatient Prescriptions  Medication Sig Dispense Refill  . acetaminophen (TYLENOL)  500 MG tablet Take 1,000 mg by mouth daily  as needed for fever or headache.    Marland Kitchen amLODipine (NORVASC) 10 MG tablet TAKE 1 TABLET(10 MG) BY MOUTH DAILY 90 tablet 1  . atorvastatin (LIPITOR) 40 MG tablet TAKE 1 TABLET BY MOUTH DAILY 90 tablet 3  . budesonide-formoterol (SYMBICORT) 160-4.5 MCG/ACT inhaler Inhale 2 puffs then rinse mouth, twice daily 1 Inhaler 12  . carvedilol (COREG) 6.25 MG tablet Take 1 tablet (6.25 mg total) by mouth 2 (two) times daily with a meal. 60 tablet 5  . Fe Cbn-Fe Gluc-FA-B12-C-DSS (FERRALET 90) 90-1 MG TABS Take 1 tablet by mouth daily.  11  . HYDROcodone-homatropine (HYCODAN) 5-1.5 MG/5ML syrup Take 5 mLs by mouth every 6 (six) hours as needed for cough. 240 mL 0  . omeprazole (PRILOSEC) 40 MG capsule Take 1 capsule (40 mg total) by mouth daily. 90 capsule 3  . Polyethyl Glycol-Propyl Glycol (SYSTANE OP) Place 1 drop into both eyes daily.     . potassium chloride SA (K-DUR,KLOR-CON) 20 MEQ tablet Take 1 tablet (20 mEq total) by mouth daily. 90 tablet 1  . valsartan (DIOVAN) 320 MG tablet TAKE 1 TABLET(320 MG) BY MOUTH DAILY 90 tablet 1  . hyaluronate sodium (RADIAPLEXRX) GEL Apply 1 application topically daily.    Marland Kitchen lidocaine-prilocaine (EMLA) cream Apply 1 application topically as needed. Apply to [facility] 1 hour prior to stick and cover w/plastic wrap (Patient not taking: Reported on 10/30/2016) 30 g 6  . prochlorperazine (COMPAZINE) 10 MG tablet Take 1 tablet (10 mg total) by mouth every 6 (six) hours as needed for nausea or vomiting. (Patient not taking: Reported on 10/30/2016) 30 tablet 0   No current facility-administered medications for this encounter.      REVIEW OF SYSTEMS:  A 15 point review of systems is documented in the electronic medical record. This was obtained by the nursing staff. However, I reviewed this with the patient to discuss relevant findings and make appropriate changes.  Pertinent items are noted in HPI.    PHYSICAL EXAM:  height is 6\' 2"  (1.88 m) and weight is 222 lb (100.7 kg). His  oral temperature is 97.6 F (36.4 C). His blood pressure is 147/75 (abnormal) and his pulse is 70. His respiration is 20 and oxygen saturation is 98%.    ECOG = 1  0 - Asymptomatic (Fully active, able to carry on all predisease activities without restriction)  1 - Symptomatic but completely ambulatory (Restricted in physically strenuous activity but ambulatory and able to carry out work of a light or sedentary nature. For example, light housework, office work)  2 - Symptomatic, <50% in bed during the day (Ambulatory and capable of all self care but unable to carry out any work activities. Up and about more than 50% of waking hours)  3 - Symptomatic, >50% in bed, but not bedbound (Capable of only limited self-care, confined to bed or chair 50% or more of waking hours)  4 - Bedbound (Completely disabled. Cannot carry on any self-care. Totally confined to bed or chair)  5 - Death   Eustace Pen MM, Creech RH, Tormey DC, et al. [phone]). "Toxicity and response criteria of the [facility]". Agency Oncol. 5 (6): 649-55     LABORATORY DATA:  Lab Results  Component Value Date   WBC 5.3 10/20/2016   HGB 11.8 (L) 10/20/2016   HCT 36.7 (L) 10/20/2016   MCV 83.2 10/20/2016   PLT 145 10/20/2016  Lab Results  Component Value Date   NA 138 10/20/2016   K 4.3 10/20/2016   CL 105 02/18/2016   CO2 26 10/20/2016   Lab Results  Component Value Date   ALT 27 10/20/2016   AST 35 (H) 10/20/2016   ALKPHOS 117 10/20/2016   BILITOT 0.28 10/20/2016      RADIOGRAPHY: Ct Chest W Contrast  Result Date: 10/20/2016 CLINICAL DATA:  Gastric cancer diagnosed 1/17 with chemotherapy and radiation therapy completed. Prostate cancer 8 years ago with prostatectomy. EXAM: CT CHEST, ABDOMEN, AND PELVIS WITH CONTRAST TECHNIQUE: Multidetector CT imaging of the chest, abdomen and pelvis was performed following the standard protocol during bolus administration of intravenous contrast.  CONTRAST:  122mL ISOVUE-300 IOPAMIDOL (ISOVUE-300) INJECTION 61% COMPARISON:  12/24/2015.  Chest radiograph 08/12/2016. FINDINGS: CT CHEST FINDINGS Cardiovascular: Aortic and branch vessel atherosclerosis. Normal heart size, without pericardial effusion. Pacer/AICD device. No central pulmonary embolism, on this non-dedicated study. Moran-sided Port-A-Cath terminates at the high Moran atrium. Mediastinum/Nodes: No supraclavicular adenopathy. No mediastinal adenopathy. A Moran hilar node measures 1.4 cm today and is similar 1.5 cm on the prior. Lungs/Pleura: No pleural fluid. Centrilobular and paraseptal emphysema. Pleural-based Moran upper lobe pulmonary nodule measures 2.3 cm on image 39/series 4 versus 1.0 cm on 12/24/2015. Moran lower lobe relatively well-circumscribed nodule measures 8 mm on image 136/series 4 and is unchanged. Somewhat irregular central Moran upper lobe 7 mm nodule is unchanged on image 49/ series 4. Other smaller pulmonary nodules, maximally 3-4 mm, are unchanged. Musculoskeletal: No acute osseous abnormality. Similar sclerotic lesion in the posterior left third rib. CT ABDOMEN PELVIS FINDINGS Hepatobiliary: r similar hypervascular focus in the left lobe of the liver at 10 mm Normal gallbladder, without biliary ductal dilatation. Pancreas: Normal, without mass or ductal dilatation. Spleen: Normal in size, without focal abnormality. Adrenals/Urinary Tract: Normal adrenal glands. Interpolar Moran renal cyst. Normal left kidney, without hydronephrosis. Normal urinary bladder. Stomach/Bowel: No residual gastric mass identified. Mild underdistention in the antrum. Scattered colonic diverticula. Normal terminal ileum and appendix. Normal small bowel. Vascular/Lymphatic: Aortic and branch vessel atherosclerosis. No typical findings of abdominal adenopathy. A left suprarenal nodule measures 6 mm on image 65/series 2 and is present in 2010. This can be presumed benign. No pelvic sidewall adenopathy.  Reproductive: Prostatectomy. Other: No significant free fluid. Fat containing Moran inguinal hernia. No evidence of omental or peritoneal disease. Musculoskeletal: No acute osseous abnormality. IMPRESSION: 1. Significant enlargement of a pleural-based Moran upper lobe pulmonary nodule. Given relative stability of other pulmonary nodules, this may represent a metachronous primary bronchogenic carcinoma. Isolated progression of pulmonary metastasis could look similar. Consider sampling. 2. No change in nonspecific borderline Moran hilar adenopathy. 3. No acute process or evidence of metastatic disease in the abdomen or pelvis. 4. Similar left hepatic lobe hypervascular focus, favoring a hemangioma. 5. similar sclerotic lesion in the posterolateral left third rib. Electronically Signed   By: Abigail Miyamoto M.D.   On: 10/20/2016 15:21   Ct Abdomen Pelvis W Contrast  Result Date: 10/20/2016 CLINICAL DATA:  Gastric cancer diagnosed 1/17 with chemotherapy and radiation therapy completed. Prostate cancer 8 years ago with prostatectomy. EXAM: CT CHEST, ABDOMEN, AND PELVIS WITH CONTRAST TECHNIQUE: Multidetector CT imaging of the chest, abdomen and pelvis was performed following the standard protocol during bolus administration of intravenous contrast. CONTRAST:  183mL ISOVUE-300 IOPAMIDOL (ISOVUE-300) INJECTION 61% COMPARISON:  12/24/2015.  Chest radiograph 08/12/2016. FINDINGS: CT CHEST FINDINGS Cardiovascular: Aortic and branch vessel atherosclerosis. Normal heart size, without pericardial effusion. Pacer/AICD  device. No central pulmonary embolism, on this non-dedicated study. Moran-sided Port-A-Cath terminates at the high Moran atrium. Mediastinum/Nodes: No supraclavicular adenopathy. No mediastinal adenopathy. A Moran hilar node measures 1.4 cm today and is similar 1.5 cm on the prior. Lungs/Pleura: No pleural fluid. Centrilobular and paraseptal emphysema. Pleural-based Moran upper lobe pulmonary nodule measures 2.3 cm  on image 39/series 4 versus 1.0 cm on 12/24/2015. Moran lower lobe relatively well-circumscribed nodule measures 8 mm on image 136/series 4 and is unchanged. Somewhat irregular central Moran upper lobe 7 mm nodule is unchanged on image 49/ series 4. Other smaller pulmonary nodules, maximally 3-4 mm, are unchanged. Musculoskeletal: No acute osseous abnormality. Similar sclerotic lesion in the posterior left third rib. CT ABDOMEN PELVIS FINDINGS Hepatobiliary: r similar hypervascular focus in the left lobe of the liver at 10 mm Normal gallbladder, without biliary ductal dilatation. Pancreas: Normal, without mass or ductal dilatation. Spleen: Normal in size, without focal abnormality. Adrenals/Urinary Tract: Normal adrenal glands. Interpolar Moran renal cyst. Normal left kidney, without hydronephrosis. Normal urinary bladder. Stomach/Bowel: No residual gastric mass identified. Mild underdistention in the antrum. Scattered colonic diverticula. Normal terminal ileum and appendix. Normal small bowel. Vascular/Lymphatic: Aortic and branch vessel atherosclerosis. No typical findings of abdominal adenopathy. A left suprarenal nodule measures 6 mm on image 65/series 2 and is present in 2010. This can be presumed benign. No pelvic sidewall adenopathy. Reproductive: Prostatectomy. Other: No significant free fluid. Fat containing Moran inguinal hernia. No evidence of omental or peritoneal disease. Musculoskeletal: No acute osseous abnormality. IMPRESSION: 1. Significant enlargement of a pleural-based Moran upper lobe pulmonary nodule. Given relative stability of other pulmonary nodules, this may represent a metachronous primary bronchogenic carcinoma. Isolated progression of pulmonary metastasis could look similar. Consider sampling. 2. No change in nonspecific borderline Moran hilar adenopathy. 3. No acute process or evidence of metastatic disease in the abdomen or pelvis. 4. Similar left hepatic lobe hypervascular focus,  favoring a hemangioma. 5. similar sclerotic lesion in the posterolateral left third rib. Electronically Signed   By: Abigail Miyamoto M.D.   On: 10/20/2016 15:21       IMPRESSION/PLAN: Metastatic adenocarcinoma of the stomach.  The patient appears to be an appropriate candidate for a course of radiation therapy. The Moran upper lobe mass is enlarging significantly and this is not the case for the remainder of the patient's disease. All of this was discussed with the patient today. The patient expressed an interest in proceeding with radiation treatment to this one site. This would consist of 2 weeks of daily radiation treatments. The patient therefore will need to proceed with simulation such that we can proceed with treatment planning. The target for radiation would consist of the Moran upper lung mass. We discussed the possible side effects and risks of treatment including fatigue, heartburn, nausea, decreased appetite, loose stools/diarrhea, skin irritation of the local area, or damage to nearby normal structures. The patient signed a consent form and a copy was placed in his medical chart. We will schedule the patient for CT simulation next Tuesday, 11/04/16, and begin radiation treatment the following week.   I spent 30 minutes face to face with the patient and more than 50% of that time was spent in counseling and/or coordination of care.    ________________________________   Jodelle Gross, MD, PhD   **Disclaimer: This note was dictated with voice recognition software. Similar sounding words can inadvertently be transcribed and this note may contain transcription errors which may not have been corrected upon publication  of note.**  This document serves as a record of services personally performed by Kyung Rudd, MD. It was created on his behalf by Linward Natal, a trained medical scribe. The creation of this record is based on the scribe's personal observations and the provider's statements to  them. This document has been checked and approved by the attending provider.

## 2016-11-04 ENCOUNTER — Ambulatory Visit
Admission: RE | Admit: 2016-11-04 | Discharge: 2016-11-04 | Disposition: A | Discharge status: Home / Self Care | Payer: Medicare HMO | Source: Ambulatory Visit | Attending: Radiation Oncology | Admitting: Radiation Oncology

## 2016-11-04 DIAGNOSIS — Z923 Personal history of irradiation: Secondary | ICD-10-CM | POA: Diagnosis not present

## 2016-11-04 DIAGNOSIS — Z8546 Personal history of malignant neoplasm of prostate: Secondary | ICD-10-CM | POA: Diagnosis not present

## 2016-11-04 DIAGNOSIS — Z85028 Personal history of other malignant neoplasm of stomach: Secondary | ICD-10-CM | POA: Diagnosis not present

## 2016-11-04 DIAGNOSIS — Z510 Encounter for antineoplastic radiation therapy: Secondary | ICD-10-CM | POA: Diagnosis not present

## 2016-11-04 DIAGNOSIS — I5022 Chronic systolic (congestive) heart failure: Secondary | ICD-10-CM | POA: Diagnosis not present

## 2016-11-04 DIAGNOSIS — I428 Other cardiomyopathies: Secondary | ICD-10-CM | POA: Diagnosis not present

## 2016-11-04 DIAGNOSIS — I110 Hypertensive heart disease with heart failure: Secondary | ICD-10-CM | POA: Diagnosis not present

## 2016-11-04 DIAGNOSIS — Z86010 Personal history of colonic polyps: Secondary | ICD-10-CM | POA: Diagnosis not present

## 2016-11-04 DIAGNOSIS — C3411 Malignant neoplasm of upper lobe, right bronchus or lung: Secondary | ICD-10-CM | POA: Diagnosis not present

## 2016-11-11 ENCOUNTER — Ambulatory Visit: Admission: RE | Admit: 2016-11-11 | Payer: Medicare HMO | Source: Ambulatory Visit | Admitting: Radiation Oncology

## 2016-11-13 ENCOUNTER — Ambulatory Visit: Payer: Medicare HMO | Admitting: Internal Medicine

## 2016-11-13 ENCOUNTER — Ambulatory Visit: Payer: Medicare HMO | Admitting: Radiation Oncology

## 2016-11-13 DIAGNOSIS — I428 Other cardiomyopathies: Secondary | ICD-10-CM | POA: Diagnosis not present

## 2016-11-13 DIAGNOSIS — C3411 Malignant neoplasm of upper lobe, right bronchus or lung: Secondary | ICD-10-CM | POA: Diagnosis not present

## 2016-11-13 DIAGNOSIS — Z8546 Personal history of malignant neoplasm of prostate: Secondary | ICD-10-CM | POA: Diagnosis not present

## 2016-11-13 DIAGNOSIS — I110 Hypertensive heart disease with heart failure: Secondary | ICD-10-CM | POA: Diagnosis not present

## 2016-11-13 DIAGNOSIS — Z85028 Personal history of other malignant neoplasm of stomach: Secondary | ICD-10-CM | POA: Diagnosis not present

## 2016-11-13 DIAGNOSIS — Z86010 Personal history of colonic polyps: Secondary | ICD-10-CM | POA: Diagnosis not present

## 2016-11-13 DIAGNOSIS — Z510 Encounter for antineoplastic radiation therapy: Secondary | ICD-10-CM | POA: Diagnosis not present

## 2016-11-13 DIAGNOSIS — Z923 Personal history of irradiation: Secondary | ICD-10-CM | POA: Diagnosis not present

## 2016-11-13 DIAGNOSIS — I5022 Chronic systolic (congestive) heart failure: Secondary | ICD-10-CM | POA: Diagnosis not present

## 2016-11-14 ENCOUNTER — Ambulatory Visit: Payer: Medicare HMO

## 2016-11-14 ENCOUNTER — Ambulatory Visit: Payer: Medicare HMO | Admitting: Radiation Oncology

## 2016-11-16 ENCOUNTER — Other Ambulatory Visit: Payer: Self-pay | Admitting: Internal Medicine

## 2016-11-16 DIAGNOSIS — I10 Essential (primary) hypertension: Secondary | ICD-10-CM

## 2016-11-17 ENCOUNTER — Ambulatory Visit: Payer: Medicare HMO | Admitting: Radiation Oncology

## 2016-11-17 ENCOUNTER — Ambulatory Visit: Payer: Medicare HMO

## 2016-11-18 ENCOUNTER — Ambulatory Visit: Payer: Medicare HMO | Admitting: Radiation Oncology

## 2016-11-18 ENCOUNTER — Ambulatory Visit: Payer: Medicare HMO

## 2016-11-18 ENCOUNTER — Ambulatory Visit
Admission: RE | Admit: 2016-11-18 | Discharge: 2016-11-18 | Disposition: A | Discharge status: Home / Self Care | Payer: Medicare HMO | Source: Ambulatory Visit | Attending: Radiation Oncology | Admitting: Radiation Oncology

## 2016-11-18 DIAGNOSIS — I5022 Chronic systolic (congestive) heart failure: Secondary | ICD-10-CM | POA: Diagnosis not present

## 2016-11-18 DIAGNOSIS — Z510 Encounter for antineoplastic radiation therapy: Secondary | ICD-10-CM | POA: Diagnosis not present

## 2016-11-18 DIAGNOSIS — Z923 Personal history of irradiation: Secondary | ICD-10-CM | POA: Diagnosis not present

## 2016-11-18 DIAGNOSIS — Z86010 Personal history of colonic polyps: Secondary | ICD-10-CM | POA: Diagnosis not present

## 2016-11-18 DIAGNOSIS — Z85028 Personal history of other malignant neoplasm of stomach: Secondary | ICD-10-CM | POA: Diagnosis not present

## 2016-11-18 DIAGNOSIS — C3411 Malignant neoplasm of upper lobe, right bronchus or lung: Secondary | ICD-10-CM | POA: Diagnosis not present

## 2016-11-18 DIAGNOSIS — Z8546 Personal history of malignant neoplasm of prostate: Secondary | ICD-10-CM | POA: Diagnosis not present

## 2016-11-18 DIAGNOSIS — I428 Other cardiomyopathies: Secondary | ICD-10-CM | POA: Diagnosis not present

## 2016-11-18 DIAGNOSIS — I110 Hypertensive heart disease with heart failure: Secondary | ICD-10-CM | POA: Diagnosis not present

## 2016-11-19 ENCOUNTER — Ambulatory Visit: Payer: Medicare HMO

## 2016-11-19 ENCOUNTER — Ambulatory Visit: Payer: Medicare HMO | Admitting: Radiation Oncology

## 2016-11-19 ENCOUNTER — Ambulatory Visit
Admission: RE | Admit: 2016-11-19 | Discharge: 2016-11-19 | Disposition: A | Discharge status: Home / Self Care | Payer: Medicare HMO | Source: Ambulatory Visit | Attending: Radiation Oncology | Admitting: Radiation Oncology

## 2016-11-19 ENCOUNTER — Ambulatory Visit (INDEPENDENT_AMBULATORY_CARE_PROVIDER_SITE_OTHER): Payer: Medicare HMO | Admitting: *Deleted

## 2016-11-19 DIAGNOSIS — Z510 Encounter for antineoplastic radiation therapy: Secondary | ICD-10-CM | POA: Diagnosis not present

## 2016-11-19 DIAGNOSIS — Z923 Personal history of irradiation: Secondary | ICD-10-CM | POA: Diagnosis not present

## 2016-11-19 DIAGNOSIS — Z85028 Personal history of other malignant neoplasm of stomach: Secondary | ICD-10-CM | POA: Diagnosis not present

## 2016-11-19 DIAGNOSIS — Z86010 Personal history of colonic polyps: Secondary | ICD-10-CM | POA: Diagnosis not present

## 2016-11-19 DIAGNOSIS — Z8546 Personal history of malignant neoplasm of prostate: Secondary | ICD-10-CM | POA: Diagnosis not present

## 2016-11-19 DIAGNOSIS — C3411 Malignant neoplasm of upper lobe, right bronchus or lung: Secondary | ICD-10-CM | POA: Diagnosis not present

## 2016-11-19 DIAGNOSIS — I428 Other cardiomyopathies: Secondary | ICD-10-CM | POA: Diagnosis not present

## 2016-11-19 DIAGNOSIS — I110 Hypertensive heart disease with heart failure: Secondary | ICD-10-CM | POA: Diagnosis not present

## 2016-11-19 DIAGNOSIS — I5022 Chronic systolic (congestive) heart failure: Secondary | ICD-10-CM | POA: Diagnosis not present

## 2016-11-19 LAB — CUP PACEART REMOTE DEVICE CHECK
Battery Remaining Percentage: 61 %
Battery Voltage: 2.98 V
Brady Statistic AP VS Percent: 4.5 %
Brady Statistic AS VP Percent: 29 %
Brady Statistic AS VS Percent: 2.7 %
HighPow Impedance: 72 Ohm
HighPow Impedance: 72 Ohm
Implantable Lead Implant Date: 20160314
Implantable Lead Location: 753859
Implantable Lead Location: 753860
Implantable Lead Model: 7122
Implantable Pulse Generator Implant Date: 20160314
Lead Channel Impedance Value: 350 Ohm
Lead Channel Impedance Value: 430 Ohm
Lead Channel Impedance Value: 450 Ohm
Lead Channel Pacing Threshold Amplitude: 0.625 V
Lead Channel Pacing Threshold Amplitude: 0.75 V
Lead Channel Pacing Threshold Pulse Width: 0.5 ms
Lead Channel Sensing Intrinsic Amplitude: 12 mV
Lead Channel Sensing Intrinsic Amplitude: 5 mV
Lead Channel Setting Pacing Amplitude: 2 V
Lead Channel Setting Pacing Amplitude: 2 V
Lead Channel Setting Pacing Amplitude: 2 V
Lead Channel Setting Pacing Pulse Width: 0.5 ms
Lead Channel Setting Pacing Pulse Width: 0.7 ms
MDC IDC LEAD IMPLANT DT: 20160314
MDC IDC LEAD IMPLANT DT: 20160314
MDC IDC LEAD LOCATION: 753858
MDC IDC MSMT BATTERY REMAINING LONGEVITY: 47 mo
MDC IDC MSMT LEADCHNL LV PACING THRESHOLD AMPLITUDE: 0.875 V
MDC IDC MSMT LEADCHNL LV PACING THRESHOLD PULSEWIDTH: 0.7 ms
MDC IDC MSMT LEADCHNL RV PACING THRESHOLD PULSEWIDTH: 0.5 ms
MDC IDC PG SERIAL: 7239374
MDC IDC SESS DTM: 20180718080018
MDC IDC SET LEADCHNL RV SENSING SENSITIVITY: 0.5 mV
MDC IDC STAT BRADY AP VP PERCENT: 60 %
MDC IDC STAT BRADY RA PERCENT PACED: 60 %

## 2016-11-19 NOTE — Progress Notes (Signed)
Remote ICD transmission.   

## 2016-11-20 ENCOUNTER — Ambulatory Visit
Admission: RE | Admit: 2016-11-20 | Discharge: 2016-11-20 | Disposition: A | Discharge status: Home / Self Care | Payer: Medicare HMO | Source: Ambulatory Visit | Attending: Radiation Oncology | Admitting: Radiation Oncology

## 2016-11-20 ENCOUNTER — Ambulatory Visit: Payer: Medicare HMO | Admitting: Radiation Oncology

## 2016-11-20 ENCOUNTER — Encounter: Payer: Self-pay | Admitting: Cardiology

## 2016-11-20 ENCOUNTER — Ambulatory Visit: Payer: Medicare HMO

## 2016-11-20 DIAGNOSIS — C3411 Malignant neoplasm of upper lobe, right bronchus or lung: Secondary | ICD-10-CM | POA: Diagnosis not present

## 2016-11-20 DIAGNOSIS — I110 Hypertensive heart disease with heart failure: Secondary | ICD-10-CM | POA: Diagnosis not present

## 2016-11-20 DIAGNOSIS — Z510 Encounter for antineoplastic radiation therapy: Secondary | ICD-10-CM | POA: Diagnosis not present

## 2016-11-20 DIAGNOSIS — Z86010 Personal history of colonic polyps: Secondary | ICD-10-CM | POA: Diagnosis not present

## 2016-11-20 DIAGNOSIS — Z923 Personal history of irradiation: Secondary | ICD-10-CM | POA: Diagnosis not present

## 2016-11-20 DIAGNOSIS — Z85028 Personal history of other malignant neoplasm of stomach: Secondary | ICD-10-CM | POA: Diagnosis not present

## 2016-11-20 DIAGNOSIS — I428 Other cardiomyopathies: Secondary | ICD-10-CM | POA: Diagnosis not present

## 2016-11-20 DIAGNOSIS — Z8546 Personal history of malignant neoplasm of prostate: Secondary | ICD-10-CM | POA: Diagnosis not present

## 2016-11-20 DIAGNOSIS — I5022 Chronic systolic (congestive) heart failure: Secondary | ICD-10-CM | POA: Diagnosis not present

## 2016-11-21 ENCOUNTER — Ambulatory Visit: Payer: Medicare HMO

## 2016-11-21 ENCOUNTER — Ambulatory Visit: Payer: Medicare HMO | Admitting: Radiation Oncology

## 2016-11-21 ENCOUNTER — Ambulatory Visit
Admission: RE | Admit: 2016-11-21 | Discharge: 2016-11-21 | Disposition: A | Discharge status: Home / Self Care | Payer: Medicare HMO | Source: Ambulatory Visit | Attending: Radiation Oncology | Admitting: Radiation Oncology

## 2016-11-21 DIAGNOSIS — I5022 Chronic systolic (congestive) heart failure: Secondary | ICD-10-CM | POA: Diagnosis not present

## 2016-11-21 DIAGNOSIS — I110 Hypertensive heart disease with heart failure: Secondary | ICD-10-CM | POA: Diagnosis not present

## 2016-11-21 DIAGNOSIS — Z86010 Personal history of colonic polyps: Secondary | ICD-10-CM | POA: Diagnosis not present

## 2016-11-21 DIAGNOSIS — Z510 Encounter for antineoplastic radiation therapy: Secondary | ICD-10-CM | POA: Diagnosis not present

## 2016-11-21 DIAGNOSIS — I428 Other cardiomyopathies: Secondary | ICD-10-CM | POA: Diagnosis not present

## 2016-11-21 DIAGNOSIS — C3411 Malignant neoplasm of upper lobe, right bronchus or lung: Secondary | ICD-10-CM | POA: Diagnosis not present

## 2016-11-21 DIAGNOSIS — Z85028 Personal history of other malignant neoplasm of stomach: Secondary | ICD-10-CM | POA: Diagnosis not present

## 2016-11-21 DIAGNOSIS — Z8546 Personal history of malignant neoplasm of prostate: Secondary | ICD-10-CM | POA: Diagnosis not present

## 2016-11-21 DIAGNOSIS — Z923 Personal history of irradiation: Secondary | ICD-10-CM | POA: Diagnosis not present

## 2016-11-24 ENCOUNTER — Ambulatory Visit
Admission: RE | Admit: 2016-11-24 | Discharge: 2016-11-24 | Disposition: A | Discharge status: Home / Self Care | Payer: Medicare HMO | Source: Ambulatory Visit | Attending: Radiation Oncology | Admitting: Radiation Oncology

## 2016-11-24 ENCOUNTER — Ambulatory Visit: Payer: Medicare HMO | Admitting: Radiation Oncology

## 2016-11-24 ENCOUNTER — Ambulatory Visit: Payer: Medicare HMO

## 2016-11-24 DIAGNOSIS — Z85028 Personal history of other malignant neoplasm of stomach: Secondary | ICD-10-CM | POA: Diagnosis not present

## 2016-11-24 DIAGNOSIS — Z86010 Personal history of colonic polyps: Secondary | ICD-10-CM | POA: Diagnosis not present

## 2016-11-24 DIAGNOSIS — I428 Other cardiomyopathies: Secondary | ICD-10-CM | POA: Diagnosis not present

## 2016-11-24 DIAGNOSIS — C3411 Malignant neoplasm of upper lobe, right bronchus or lung: Secondary | ICD-10-CM | POA: Diagnosis not present

## 2016-11-24 DIAGNOSIS — I5022 Chronic systolic (congestive) heart failure: Secondary | ICD-10-CM | POA: Diagnosis not present

## 2016-11-24 DIAGNOSIS — I110 Hypertensive heart disease with heart failure: Secondary | ICD-10-CM | POA: Diagnosis not present

## 2016-11-24 DIAGNOSIS — Z510 Encounter for antineoplastic radiation therapy: Secondary | ICD-10-CM | POA: Diagnosis not present

## 2016-11-24 DIAGNOSIS — Z8546 Personal history of malignant neoplasm of prostate: Secondary | ICD-10-CM | POA: Diagnosis not present

## 2016-11-24 DIAGNOSIS — Z923 Personal history of irradiation: Secondary | ICD-10-CM | POA: Diagnosis not present

## 2016-11-25 ENCOUNTER — Ambulatory Visit: Payer: Medicare HMO | Admitting: Radiation Oncology

## 2016-11-25 ENCOUNTER — Ambulatory Visit
Admission: RE | Admit: 2016-11-25 | Discharge: 2016-11-25 | Disposition: A | Discharge status: Home / Self Care | Payer: Medicare HMO | Source: Ambulatory Visit | Attending: Radiation Oncology | Admitting: Radiation Oncology

## 2016-11-25 ENCOUNTER — Ambulatory Visit: Payer: Medicare HMO

## 2016-11-25 DIAGNOSIS — C3411 Malignant neoplasm of upper lobe, right bronchus or lung: Secondary | ICD-10-CM | POA: Diagnosis not present

## 2016-11-25 DIAGNOSIS — Z85028 Personal history of other malignant neoplasm of stomach: Secondary | ICD-10-CM | POA: Diagnosis not present

## 2016-11-25 DIAGNOSIS — Z8546 Personal history of malignant neoplasm of prostate: Secondary | ICD-10-CM | POA: Diagnosis not present

## 2016-11-25 DIAGNOSIS — I5022 Chronic systolic (congestive) heart failure: Secondary | ICD-10-CM | POA: Diagnosis not present

## 2016-11-25 DIAGNOSIS — Z86010 Personal history of colonic polyps: Secondary | ICD-10-CM | POA: Diagnosis not present

## 2016-11-25 DIAGNOSIS — Z510 Encounter for antineoplastic radiation therapy: Secondary | ICD-10-CM | POA: Diagnosis not present

## 2016-11-25 DIAGNOSIS — I428 Other cardiomyopathies: Secondary | ICD-10-CM | POA: Diagnosis not present

## 2016-11-25 DIAGNOSIS — I110 Hypertensive heart disease with heart failure: Secondary | ICD-10-CM | POA: Diagnosis not present

## 2016-11-25 DIAGNOSIS — Z923 Personal history of irradiation: Secondary | ICD-10-CM | POA: Diagnosis not present

## 2016-11-26 ENCOUNTER — Ambulatory Visit: Payer: Medicare HMO | Admitting: Radiation Oncology

## 2016-11-26 ENCOUNTER — Ambulatory Visit
Admission: RE | Admit: 2016-11-26 | Discharge: 2016-11-26 | Disposition: A | Discharge status: Home / Self Care | Payer: Medicare HMO | Source: Ambulatory Visit | Attending: Radiation Oncology | Admitting: Radiation Oncology

## 2016-11-26 ENCOUNTER — Ambulatory Visit: Payer: Medicare HMO

## 2016-11-26 DIAGNOSIS — Z86010 Personal history of colonic polyps: Secondary | ICD-10-CM | POA: Diagnosis not present

## 2016-11-26 DIAGNOSIS — I428 Other cardiomyopathies: Secondary | ICD-10-CM | POA: Diagnosis not present

## 2016-11-26 DIAGNOSIS — I110 Hypertensive heart disease with heart failure: Secondary | ICD-10-CM | POA: Diagnosis not present

## 2016-11-26 DIAGNOSIS — Z510 Encounter for antineoplastic radiation therapy: Secondary | ICD-10-CM | POA: Diagnosis not present

## 2016-11-26 DIAGNOSIS — Z85028 Personal history of other malignant neoplasm of stomach: Secondary | ICD-10-CM | POA: Diagnosis not present

## 2016-11-26 DIAGNOSIS — C3411 Malignant neoplasm of upper lobe, right bronchus or lung: Secondary | ICD-10-CM | POA: Diagnosis not present

## 2016-11-26 DIAGNOSIS — Z8546 Personal history of malignant neoplasm of prostate: Secondary | ICD-10-CM | POA: Diagnosis not present

## 2016-11-26 DIAGNOSIS — Z923 Personal history of irradiation: Secondary | ICD-10-CM | POA: Diagnosis not present

## 2016-11-26 DIAGNOSIS — I5022 Chronic systolic (congestive) heart failure: Secondary | ICD-10-CM | POA: Diagnosis not present

## 2016-11-27 ENCOUNTER — Ambulatory Visit
Admission: RE | Admit: 2016-11-27 | Discharge: 2016-11-27 | Disposition: A | Discharge status: Home / Self Care | Payer: Medicare HMO | Source: Ambulatory Visit | Attending: Radiation Oncology | Admitting: Radiation Oncology

## 2016-11-27 DIAGNOSIS — C3411 Malignant neoplasm of upper lobe, right bronchus or lung: Secondary | ICD-10-CM | POA: Diagnosis not present

## 2016-11-27 DIAGNOSIS — Z8546 Personal history of malignant neoplasm of prostate: Secondary | ICD-10-CM | POA: Diagnosis not present

## 2016-11-27 DIAGNOSIS — Z86010 Personal history of colonic polyps: Secondary | ICD-10-CM | POA: Diagnosis not present

## 2016-11-27 DIAGNOSIS — Z923 Personal history of irradiation: Secondary | ICD-10-CM | POA: Diagnosis not present

## 2016-11-27 DIAGNOSIS — Z510 Encounter for antineoplastic radiation therapy: Secondary | ICD-10-CM | POA: Diagnosis not present

## 2016-11-27 DIAGNOSIS — Z85028 Personal history of other malignant neoplasm of stomach: Secondary | ICD-10-CM | POA: Diagnosis not present

## 2016-11-27 DIAGNOSIS — I428 Other cardiomyopathies: Secondary | ICD-10-CM | POA: Diagnosis not present

## 2016-11-27 DIAGNOSIS — I5022 Chronic systolic (congestive) heart failure: Secondary | ICD-10-CM | POA: Diagnosis not present

## 2016-11-27 DIAGNOSIS — I110 Hypertensive heart disease with heart failure: Secondary | ICD-10-CM | POA: Diagnosis not present

## 2016-11-28 ENCOUNTER — Ambulatory Visit
Admission: RE | Admit: 2016-11-28 | Discharge: 2016-11-28 | Disposition: A | Discharge status: Home / Self Care | Payer: Medicare HMO | Source: Ambulatory Visit | Attending: Radiation Oncology | Admitting: Radiation Oncology

## 2016-11-28 DIAGNOSIS — I110 Hypertensive heart disease with heart failure: Secondary | ICD-10-CM | POA: Diagnosis not present

## 2016-11-28 DIAGNOSIS — I5022 Chronic systolic (congestive) heart failure: Secondary | ICD-10-CM | POA: Diagnosis not present

## 2016-11-28 DIAGNOSIS — Z85028 Personal history of other malignant neoplasm of stomach: Secondary | ICD-10-CM | POA: Diagnosis not present

## 2016-11-28 DIAGNOSIS — Z8546 Personal history of malignant neoplasm of prostate: Secondary | ICD-10-CM | POA: Diagnosis not present

## 2016-11-28 DIAGNOSIS — Z510 Encounter for antineoplastic radiation therapy: Secondary | ICD-10-CM | POA: Diagnosis not present

## 2016-11-28 DIAGNOSIS — C3411 Malignant neoplasm of upper lobe, right bronchus or lung: Secondary | ICD-10-CM | POA: Diagnosis not present

## 2016-11-28 DIAGNOSIS — Z86010 Personal history of colonic polyps: Secondary | ICD-10-CM | POA: Diagnosis not present

## 2016-11-28 DIAGNOSIS — I428 Other cardiomyopathies: Secondary | ICD-10-CM | POA: Diagnosis not present

## 2016-11-28 DIAGNOSIS — Z923 Personal history of irradiation: Secondary | ICD-10-CM | POA: Diagnosis not present

## 2016-12-01 ENCOUNTER — Encounter: Payer: Self-pay | Admitting: Radiation Oncology

## 2016-12-01 ENCOUNTER — Ambulatory Visit
Admission: RE | Admit: 2016-12-01 | Discharge: 2016-12-01 | Disposition: A | Discharge status: Home / Self Care | Payer: Medicare HMO | Source: Ambulatory Visit | Attending: Radiation Oncology | Admitting: Radiation Oncology

## 2016-12-01 DIAGNOSIS — Z8546 Personal history of malignant neoplasm of prostate: Secondary | ICD-10-CM | POA: Diagnosis not present

## 2016-12-01 DIAGNOSIS — Z85028 Personal history of other malignant neoplasm of stomach: Secondary | ICD-10-CM | POA: Diagnosis not present

## 2016-12-01 DIAGNOSIS — Z510 Encounter for antineoplastic radiation therapy: Secondary | ICD-10-CM | POA: Diagnosis not present

## 2016-12-01 DIAGNOSIS — Z86010 Personal history of colonic polyps: Secondary | ICD-10-CM | POA: Diagnosis not present

## 2016-12-01 DIAGNOSIS — Z923 Personal history of irradiation: Secondary | ICD-10-CM | POA: Diagnosis not present

## 2016-12-01 DIAGNOSIS — I5022 Chronic systolic (congestive) heart failure: Secondary | ICD-10-CM | POA: Diagnosis not present

## 2016-12-01 DIAGNOSIS — I110 Hypertensive heart disease with heart failure: Secondary | ICD-10-CM | POA: Diagnosis not present

## 2016-12-01 DIAGNOSIS — I428 Other cardiomyopathies: Secondary | ICD-10-CM | POA: Diagnosis not present

## 2016-12-01 DIAGNOSIS — C3411 Malignant neoplasm of upper lobe, right bronchus or lung: Secondary | ICD-10-CM | POA: Diagnosis not present

## 2016-12-04 ENCOUNTER — Telehealth: Payer: Self-pay | Admitting: Internal Medicine

## 2016-12-04 ENCOUNTER — Other Ambulatory Visit: Payer: Self-pay | Admitting: Internal Medicine

## 2016-12-04 DIAGNOSIS — E118 Type 2 diabetes mellitus with unspecified complications: Secondary | ICD-10-CM

## 2016-12-04 DIAGNOSIS — I5022 Chronic systolic (congestive) heart failure: Secondary | ICD-10-CM

## 2016-12-04 DIAGNOSIS — I428 Other cardiomyopathies: Secondary | ICD-10-CM

## 2016-12-04 DIAGNOSIS — I10 Essential (primary) hypertension: Secondary | ICD-10-CM

## 2016-12-04 MED ORDER — IRBESARTAN 300 MG PO TABS: 300.0000 mg | ORAL_TABLET | Freq: Every day | ORAL | 1 refills | Status: DC

## 2016-12-04 NOTE — Telephone Encounter (Signed)
RX sent

## 2016-12-04 NOTE — Telephone Encounter (Signed)
Patient requesting different medication sent to [facility] on [address] in place of valsartan.  Pt requesting call back as to what he needs to do while waiting on a new script to be sent to the pharmacy.

## 2016-12-04 NOTE — Progress Notes (Signed)
  Radiation Oncology         [phone] ________________________________  Name: [name] MRN: [id number]  Date: 12/01/2016  DOB: 09/05/1934  End of Treatment Note  Diagnosis:   Metastatic adenocarcinoma of the stomach with right upper lobe mass.  Indication for treatment:  Curative       Radiation treatment dates:   11/18/2016 to 12/01/2016  Site/dose:   The tumor in the right lung was treated with a course of stereotactic body radiation treatment. The patient received 40 Gy in 10 fractions at 4 G per fraction.  Narrative: The patient tolerated radiation treatment relatively well.   The patient did not have any signs of acute toxicity during treatment. He did report mild itching in the treatment area and admitted to not using sonafine cream. He denied trouble swallowing.  Plan: The patient has completed radiation treatment. The patient will return to radiation oncology clinic for routine followup in one month. I advised the patient to call or return sooner if they have any questions or concerns related to their recovery or treatment.   ------------------------------------------------  Jodelle Gross, MD, PhD  This document serves as a record of services personally performed by Kyung Rudd, MD. It was created on his behalf by Arlyce Harman, a trained medical scribe. The creation of this record is based on the scribe's personal observations and the provider's statements to them. This document has been checked and approved by the attending provider.

## 2016-12-05 NOTE — Telephone Encounter (Signed)
Notified pt MD sent new BP med to [facility]...[name]

## 2016-12-11 ENCOUNTER — Other Ambulatory Visit: Payer: Self-pay | Admitting: *Deleted

## 2016-12-12 ENCOUNTER — Ambulatory Visit (HOSPITAL_COMMUNITY)
Admission: RE | Admit: 2016-12-12 | Discharge: 2016-12-12 | Disposition: A | Discharge status: Home / Self Care | Payer: Medicare HMO | Source: Ambulatory Visit | Attending: Oncology | Admitting: Oncology

## 2016-12-12 ENCOUNTER — Ambulatory Visit (HOSPITAL_BASED_OUTPATIENT_CLINIC_OR_DEPARTMENT_OTHER): Payer: Medicare HMO

## 2016-12-12 ENCOUNTER — Encounter (HOSPITAL_COMMUNITY): Payer: Self-pay

## 2016-12-12 DIAGNOSIS — Z452 Encounter for adjustment and management of vascular access device: Secondary | ICD-10-CM

## 2016-12-12 DIAGNOSIS — C169 Malignant neoplasm of stomach, unspecified: Secondary | ICD-10-CM

## 2016-12-12 DIAGNOSIS — K449 Diaphragmatic hernia without obstruction or gangrene: Secondary | ICD-10-CM | POA: Diagnosis not present

## 2016-12-12 DIAGNOSIS — J439 Emphysema, unspecified: Secondary | ICD-10-CM | POA: Diagnosis not present

## 2016-12-12 DIAGNOSIS — R911 Solitary pulmonary nodule: Secondary | ICD-10-CM | POA: Diagnosis not present

## 2016-12-12 DIAGNOSIS — I700 Atherosclerosis of aorta: Secondary | ICD-10-CM | POA: Diagnosis not present

## 2016-12-12 DIAGNOSIS — C162 Malignant neoplasm of body of stomach: Secondary | ICD-10-CM

## 2016-12-12 DIAGNOSIS — K8020 Calculus of gallbladder without cholecystitis without obstruction: Secondary | ICD-10-CM | POA: Insufficient documentation

## 2016-12-12 DIAGNOSIS — R188 Other ascites: Secondary | ICD-10-CM | POA: Insufficient documentation

## 2016-12-12 DIAGNOSIS — Z95828 Presence of other vascular implants and grafts: Secondary | ICD-10-CM

## 2016-12-12 MED ORDER — HEPARIN SOD (PORK) LOCK FLUSH 100 UNIT/ML IV SOLN
INTRAVENOUS | Status: AC
  Filled 2016-12-12: qty 5

## 2016-12-12 MED ORDER — SODIUM CHLORIDE 0.9 % IJ SOLN
10.0000 mL | INTRAMUSCULAR | Status: DC | PRN
  Administered 2016-12-12: 10 mL via INTRAVENOUS
  Filled 2016-12-12: qty 10

## 2016-12-12 NOTE — Patient Instructions (Signed)

## 2016-12-15 ENCOUNTER — Telehealth: Payer: Self-pay

## 2016-12-15 ENCOUNTER — Ambulatory Visit (HOSPITAL_BASED_OUTPATIENT_CLINIC_OR_DEPARTMENT_OTHER): Payer: Medicare HMO | Admitting: Nurse Practitioner

## 2016-12-15 VITALS — BP 145/58 | HR 69 | Temp 98.0°F | Resp 18 | Ht 74.0 in | Wt 223.1 lb

## 2016-12-15 DIAGNOSIS — R140 Abdominal distension (gaseous): Secondary | ICD-10-CM | POA: Diagnosis not present

## 2016-12-15 DIAGNOSIS — C7801 Secondary malignant neoplasm of right lung: Secondary | ICD-10-CM

## 2016-12-15 DIAGNOSIS — C162 Malignant neoplasm of body of stomach: Secondary | ICD-10-CM

## 2016-12-15 DIAGNOSIS — C169 Malignant neoplasm of stomach, unspecified: Secondary | ICD-10-CM

## 2016-12-15 DIAGNOSIS — R188 Other ascites: Secondary | ICD-10-CM

## 2016-12-15 NOTE — Telephone Encounter (Signed)
appts made and avs printed for pateint 

## 2016-12-15 NOTE — Progress Notes (Addendum)
[name]   Diagnosis:  Gastric cancer  INTERVAL HISTORY:   [name] returns as scheduled. He notes intermittent abdominal distention and occasional abdominal "cramps". He notes his stools are dark green. He denies any black stools. He reports recent early satiety. He estimates 2 or 3 episodes of nausea/vomiting in the past month. This tends to occur with coughing.  Objective:  Vital signs in last 24 hours:  Blood pressure (!) 145/58, pulse 69, temperature 98 F (36.7 C), temperature source Oral, resp. rate 18, height 6\' 2"  (1.88 m), weight 223 lb 1.6 oz (101.2 kg), SpO2 95 %.    HEENT: Neck without masses. Lymphatics: No palpable cervical, supraclavicular or axillary lymph nodes. Resp: Scattered rhonchi. No respiratory distress. Cardio: Regular rate and rhythm. GI: Abdomen is distended consistent with ascites. No hepatomegaly. Vascular: Trace pitting edema at the lower leg/ankles.  Port-A-Cath without erythema. Skin surrounding the Port-A-Cath is hyperpigmented.  Lab Results:  Lab Results  Component Value Date   WBC 5.3 10/20/2016   HGB 11.8 (L) 10/20/2016   HCT 36.7 (L) 10/20/2016   MCV 83.2 10/20/2016   PLT 145 10/20/2016   NEUTROABS 4.2 10/20/2016    Imaging:  No results found.  Medications: I have reviewed the patient's current medications.  Assessment/Plan: 1. Gastric Cancer  Lesser curvature gastric body mass noted on endoscopy 06/27/2015, biopsy confirmed adenocarcinoma  Staging CT scans to 06/28/2015 with a gastric body mass, adenopathy adjacent to the stomach, chest adenopathy, a single enhancing liver lesion, and indeterminate nodular pulmonary lesions  PET scan 07/12/2015-hypermetabolic gastric mass, gastrohepatic ligament metastases, metastasis at the left adrenal gland, hypermetabolic right paratracheal, right hilar, and subcarinal nodes moderate metabolic activity involving 2 pulmonary lesions  Cycle 1  FOLFOX 07/19/2015  Cycle 2 FOLFOX 08/02/2015  Cycle 3 FOLFOX 08/23/2015 with Neulasta support  Cycle 4 FOLFOX 09/06/2015 with Neulasta support  Cycle 5 FOLFOX 09/20/2015 with Neulasta support  Restaging CTs 10/02/2015-decrease in chest and perigastric lymphadenopathy, decrease thickening at the distal stomach, 11 mm sclerotic lesion in the posterior left third rib-barely visible on the PET/CT 07/12/2015  Cycle 6 FOLFOX 10/04/2015  Cycle 7 FOLFOX 10/18/2015  Cycle 8 FOLFOX 11/08/2015 (oxaliplatin held due to neuropathy)  Cycle 9 FOLFOX 11/22/2015 (oxaliplatin held secondary to neuropathy)  Cycle 10 FOLFOX 12/06/2015 (oxaliplatin held secondary to neuropathy)  CTs 12/24/2015-stable lung nodules and mediastinal lymph nodes, no evidence of disease progression  Upper endoscopy 08/13/2016 showed a large ulcerated circumferential mass with no bleeding in the gastric antrum and at the pylorus causing partial outlet obstruction.  Initiation of infusional 5-FU and radiation 08/21/2016;completed 09/10/2016  CTs 10/20/2016-enlargement of a pleural-based right upper lobe nodule, other lung nodules are unchanged, no evidence of metastatic disease in the abdomen/pelvis  CT chest 12/12/2016-mild decrease in size of dominant pulmonary nodule medial right upper lobe. Other bilateral subcentimeter pulmonary nodules showed no significant change. New abdominal ascites.  2. COPD  3. Nonischemic cardiomyopathy  4. Implanted cardiac defibrillator  5. History of prostate cancer  6. Hypertension  7. History of Iron deficiency anemia-hemoglobin lower03/20/2018  8. Neutropenia secondary to chemotherapy-Neulasta added with cycle 3 FOLFOX 08/23/2015  9. 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 Anemia secondary to blood loss related to #1. Blood transfusion 08/12/2016,08/16/2016.Hemoglobin improved 09/04/2016.Hemoglobin stable  09/18/2016.  12. 08/28/2016-hyperpigmentation with skin induration surrounding the Port-A-Cath, most likely a tape or skin cleanser reaction; 09/18/2016 the skin has healed; marked hyperpigmentation persists.   Disposition:  [name] appears stable. The recent restaging CT scan shows the lung nodule is smaller. There is new ascites. On exam he has abdominal distention. We are referring him for a therapeutic/diagnostic paracentesis. We will see him back in 2 weeks to review the results of the cytology. He will contact the office in the interim with any problems.  Patient seen with Dr. [name].    Ned Card ANP/GNP-BC   12/15/2016  9:44 AM  This was a shared visit with Ned Card. [name] was interviewed and examined. We reviewed the restaging CT images. The right lung nodule is smaller, but he has developed new ascites. [name] will understands the ascites may be related to progression of the gastric cancer. He'll be referred for a diagnostic paracentesis.  Julieanne Manson, M.D.

## 2016-12-26 ENCOUNTER — Ambulatory Visit (HOSPITAL_COMMUNITY)
Admission: RE | Admit: 2016-12-26 | Discharge: 2016-12-26 | Disposition: A | Discharge status: Home / Self Care | Payer: Medicare HMO | Source: Ambulatory Visit | Attending: Nurse Practitioner | Admitting: Nurse Practitioner

## 2016-12-26 DIAGNOSIS — R180 Malignant ascites: Secondary | ICD-10-CM | POA: Diagnosis not present

## 2016-12-26 DIAGNOSIS — C786 Secondary malignant neoplasm of retroperitoneum and peritoneum: Secondary | ICD-10-CM | POA: Diagnosis not present

## 2016-12-26 DIAGNOSIS — C269 Malignant neoplasm of ill-defined sites within the digestive system: Secondary | ICD-10-CM | POA: Diagnosis not present

## 2016-12-26 DIAGNOSIS — C169 Malignant neoplasm of stomach, unspecified: Secondary | ICD-10-CM | POA: Diagnosis not present

## 2016-12-26 DIAGNOSIS — R188 Other ascites: Secondary | ICD-10-CM | POA: Diagnosis not present

## 2016-12-26 NOTE — Procedures (Signed)
PROCEDURE SUMMARY:  Successful US guided paracentesis from right lateral abdomen.  Yielded 2.8 liters of amber fluid.  No immediate complications.  Pt tolerated well.   Specimen was sent for labs.  Docia Barrier PA-C 12/26/2016 11:32 AM

## 2016-12-29 ENCOUNTER — Ambulatory Visit (HOSPITAL_BASED_OUTPATIENT_CLINIC_OR_DEPARTMENT_OTHER): Payer: Medicare HMO | Admitting: Oncology

## 2016-12-29 ENCOUNTER — Other Ambulatory Visit: Payer: Self-pay

## 2016-12-29 ENCOUNTER — Telehealth: Payer: Self-pay | Admitting: Oncology

## 2016-12-29 ENCOUNTER — Other Ambulatory Visit: Payer: Self-pay | Admitting: Oncology

## 2016-12-29 VITALS — BP 164/70 | HR 78 | Temp 97.7°F | Resp 18 | Ht 74.0 in | Wt 218.1 lb

## 2016-12-29 DIAGNOSIS — C162 Malignant neoplasm of body of stomach: Secondary | ICD-10-CM | POA: Diagnosis not present

## 2016-12-29 DIAGNOSIS — C160 Malignant neoplasm of cardia: Secondary | ICD-10-CM

## 2016-12-29 DIAGNOSIS — G620 Drug-induced polyneuropathy: Secondary | ICD-10-CM | POA: Diagnosis not present

## 2016-12-29 DIAGNOSIS — C169 Malignant neoplasm of stomach, unspecified: Secondary | ICD-10-CM

## 2016-12-29 DIAGNOSIS — D630 Anemia in neoplastic disease: Secondary | ICD-10-CM | POA: Diagnosis not present

## 2016-12-29 DIAGNOSIS — C7972 Secondary malignant neoplasm of left adrenal gland: Secondary | ICD-10-CM | POA: Diagnosis not present

## 2016-12-29 DIAGNOSIS — R180 Malignant ascites: Secondary | ICD-10-CM

## 2016-12-29 MED ORDER — PROCHLORPERAZINE MALEATE 10 MG PO TABS: 10.0000 mg | ORAL_TABLET | Freq: Four times a day (QID) | ORAL | 0 refills | Status: DC | PRN

## 2016-12-29 NOTE — Telephone Encounter (Signed)
Scheduled appt per 8/27 los - Gave patient AVS and calender per los.

## 2016-12-29 NOTE — Progress Notes (Signed)
[place] OFFICE PROGRESS NOTE   Diagnosis: Gastric cancer  INTERVAL HISTORY:   [name] returns as scheduled. He underwent a paracentesis for 2.8 L of ascites on 12/26/2016. He reports relief of abdominal distention with the paracentesis. He had recurrent bloating that improved after bowel movement this morning. He has intermittent nausea. No other complaint.  Objective:  Vital signs in last 24 hours:  Blood pressure (!) 164/70, pulse 78, temperature 97.7 F (36.5 C), temperature source Oral, resp. rate 18, height 6\' 2"  (1.88 m), weight 218 lb 1.6 oz (98.9 kg), SpO2 97 %.    Resp: Lungs clear bilaterally Cardio: Regular rate and rhythm GI: No hepatosplenomegaly, no mass, nontender, mildly distended Vascular: No leg edema  Portacath/PICC-without erythema  Imaging:  US Paracentesis  Result Date: 12/26/2016 INDICATION: Patient with history of gastric cancer, now with ascites. Request is made for diagnostic and therapeutic paracentesis. EXAM: ULTRASOUND GUIDED DIAGNOSTIC AND THERAPEUTIC PARACENTESIS MEDICATIONS: 10 mL 1% lidocaine COMPLICATIONS: None immediate. PROCEDURE: Informed written consent was obtained from the patient after a discussion of the risks, benefits and alternatives to treatment. A timeout was performed prior to the initiation of the procedure. Initial ultrasound scanning demonstrates a moderate amount of ascites within the right lower abdominal quadrant. The right lateral abdomen was prepped and draped in the usual sterile fashion. 1% lidocaine with epinephrine was used for local anesthesia. Following this, a 19 gauge, 10-cm, Yueh catheter was introduced. An ultrasound image was saved for documentation purposes. The paracentesis was performed. The catheter was removed and a dressing was applied. The patient tolerated the procedure well without immediate post procedural complication. FINDINGS: A total of approximately 2.8 liters of amber fluid was  removed. Samples were sent to the laboratory as requested by the clinical team. IMPRESSION: Successful ultrasound-guided diagnostic and therapeutic paracentesis yielding 2.8 liters of peritoneal fluid. Read by:  Brynda Greathouse PA-C Electronically Signed   By: Jerilynn Mages.  Shick M.D.   On: 12/26/2016 11:36    Medications: I have reviewed the patient's current medications.  Assessment/Plan: 1. Gastric Cancer  Lesser curvature gastric body mass noted on endoscopy 06/27/2015, biopsy confirmed adenocarcinoma  Staging CT scans to 06/28/2015 with a gastric body mass, adenopathy adjacent to the stomach, chest adenopathy, a single enhancing liver lesion, and indeterminate nodular pulmonary lesions  PET scan 07/12/2015-hypermetabolic gastric mass, gastrohepatic ligament metastases, metastasis at the left adrenal gland, hypermetabolic right paratracheal, right hilar, and subcarinal nodes moderate metabolic activity involving 2 pulmonary lesions  Cycle 1 FOLFOX 07/19/2015  Cycle 2 FOLFOX 08/02/2015  Cycle 3 FOLFOX 08/23/2015 with Neulasta support  Cycle 4 FOLFOX 09/06/2015 with Neulasta support  Cycle 5 FOLFOX 09/20/2015 with Neulasta support  Restaging CTs 10/02/2015-decrease in chest and perigastric lymphadenopathy, decrease thickening at the distal stomach, 11 mm sclerotic lesion in the posterior left third rib-barely visible on the PET/CT 07/12/2015  Cycle 6 FOLFOX 10/04/2015  Cycle 7 FOLFOX 10/18/2015  Cycle 8 FOLFOX 11/08/2015 (oxaliplatin held due to neuropathy)  Cycle 9 FOLFOX 11/22/2015 (oxaliplatin held secondary to neuropathy)  Cycle 10 FOLFOX 12/06/2015 (oxaliplatin held secondary to neuropathy)  CTs 12/24/2015-stable lung nodules and mediastinal lymph nodes, no evidence of disease progression  Upper endoscopy 08/13/2016 showed a large ulcerated circumferential mass with no bleeding in the gastric antrum and at the pylorus causing partial outlet obstruction.  Initiation of infusional  5-FU and radiation 08/21/2016;completed 09/10/2016  CTs06/18/2018-enlargement of a pleural-based right upper lobe nodule, other lung nodules are unchanged, no evidence of metastatic disease in the  abdomen/pelvis  CT chest 12/12/2016-mild decrease in size of dominant pulmonary nodule medial right upper lobe. Other bilateral subcentimeter pulmonary nodules showed no significant change. New abdominal ascites.  Paracentesis 12/26/2016-cytology positive for adenocarcinoma  2. COPD  3. Nonischemic cardiomyopathy  4. Implanted cardiac defibrillator  5. History of prostate cancer  6. Hypertension  7. History of Iron deficiency anemia-hemoglobin lower03/20/2018  8. Neutropenia secondary to chemotherapy-Neulasta added with cycle 3 FOLFOX 08/23/2015  9. 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 Anemia secondary to blood loss related to #1. Blood transfusion 08/12/2016,08/16/2016.Hemoglobin improved 09/04/2016.Hemoglobin stable 09/18/2016.  12. 08/28/2016-hyperpigmentation with skin induration surrounding the Port-A-Cath, most likely a tape or skin cleanser reaction; 09/18/2016 the skin has healed; marked hyperpigmentation persists.   Disposition:  [name] has a history of a second gastric cancer. He has developed ascites. The preliminary review of the cytology from 12/26/2016 is consistent with metastatic adenocarcinoma. I discussed the malignant ascites, prognosis, and treatment options with [name] and his family. I explained the poor prognosis associated with malignant ascites in patients with gastric cancer. We discussed comfort/supportive care versus a trial of salvage chemotherapy. We also discussed hospice care.  He will return for an office visit and further discussion in 2 weeks. He will contact us in the interim if the ascites reaccumulate. We will arrange for a palliative paracentesis as needed.  25 minutes  were spent with the patient today. The majority of the time was used for counseling and coordination of care.  Donneta Romberg, MD  12/29/2016  5:07 PM

## 2016-12-30 ENCOUNTER — Telehealth: Payer: Self-pay | Admitting: *Deleted

## 2016-12-30 ENCOUNTER — Ambulatory Visit: Admission: RE | Admit: 2016-12-30 | Payer: Medicare HMO | Source: Ambulatory Visit | Admitting: Radiation Oncology

## 2016-12-30 NOTE — Telephone Encounter (Signed)
CALLED PATIENT TO INFORM THAT FU VISIT FOR 12-30-16 HAS BEEN MOVED TO 01-01-17 @ 8:30 AM WITH ALISON PERKINS, SPOKE WITH PATIENT AND HE IS AWARE OF THIS APPT.

## 2017-01-01 ENCOUNTER — Ambulatory Visit
Admission: RE | Admit: 2017-01-01 | Discharge: 2017-01-01 | Disposition: A | Discharge status: Home / Self Care | Payer: Medicare HMO | Source: Ambulatory Visit | Attending: Radiation Oncology | Admitting: Radiation Oncology

## 2017-01-01 ENCOUNTER — Encounter: Payer: Self-pay | Admitting: Radiation Oncology

## 2017-01-01 VITALS — BP 137/70 | HR 72 | Temp 97.6°F | Resp 20 | Wt 221.0 lb

## 2017-01-01 DIAGNOSIS — C169 Malignant neoplasm of stomach, unspecified: Secondary | ICD-10-CM | POA: Diagnosis not present

## 2017-01-01 NOTE — Progress Notes (Signed)
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  Interval Since Last Radiation:  4 weeks   11/18/2016 to 12/01/2016  The tumor in the right lung was treated with a course of stereotactic body radiation treatment. The patient received 40 Gy in 10 fractions at 4 G per fraction.  Narrative:  The patient returns today for routine follow-up. The patient did well tolerating his regimen, and has recently been seen by Dr. [name], unfortunately his recent CT imaging on 12/12/2016 reveal progressive disease in the abdominal cavity with ascites, this was removed with paracentesis on 12/26/2016 and the fluid did contain malignant cells consistent with his known gastric cancer. He is planning on following up with Dr. [name] on September 12, to discuss options of either continuing with systemic treatments, versus palliative/hospice care. Unfortunately the patient also happens to be the primary caregiver for his wife who has dementia.                           On review of systems, the patient states he is feeling better since having the centesis, but feels that some of the fluid is starting to reaccumulate. He denies any nausea or vomiting. He reports his bowels are working well at this time. No other complaints are verbalized  ALLERGIES:  is allergic to adhesive [tape] and other.  Meds: Current Outpatient Prescriptions  Medication Sig Dispense Refill  . acetaminophen (TYLENOL) 500 MG tablet Take 1,000 mg by mouth daily as needed for fever or headache.    Marland Kitchen amLODipine (NORVASC) 10 MG tablet TAKE 1 TABLET(10 MG) BY MOUTH DAILY 90 tablet 1  . atorvastatin (LIPITOR) 40 MG tablet TAKE 1 TABLET BY MOUTH DAILY 90 tablet 3  . budesonide-formoterol  (SYMBICORT) 160-4.5 MCG/ACT inhaler Inhale 2 puffs then rinse mouth, twice daily 1 Inhaler 12  . carvedilol (COREG) 6.25 MG tablet TAKE 1 TABLET(6.25 MG) BY MOUTH TWICE DAILY WITH A MEAL 180 tablet 1  . Fe Cbn-Fe Gluc-FA-B12-C-DSS (FERRALET 90) 90-1 MG TABS Take 1 tablet by mouth daily.  11  . ferrous sulfate 325 (65 FE) MG tablet Take 325 mg by mouth 2 (two) times daily with a meal.    . hyaluronate sodium (RADIAPLEXRX) GEL Apply 1 application topically daily.    Marland Kitchen HYDROcodone-homatropine (HYCODAN) 5-1.5 MG/5ML syrup Take 5 mLs by mouth every 6 (six) hours as needed for cough. 240 mL 0  . irbesartan (AVAPRO) 300 MG tablet Take 1 tablet (300 mg total) by mouth daily. 90 tablet 1  . Polyethyl Glycol-Propyl Glycol (SYSTANE OP) Place 1 drop into both eyes daily.     . potassium chloride SA (K-DUR,KLOR-CON) 20 MEQ tablet Take 1 tablet (20 mEq total) by mouth daily. 90 tablet 1  . prochlorperazine (COMPAZINE) 10 MG tablet Take 1 tablet (10 mg total) by mouth every 6 (six) hours as needed for nausea or vomiting. 30 tablet 0  . lidocaine-prilocaine (EMLA) cream Apply 1 application topically as needed. Apply to [facility] 1 hour prior to stick and cover w/plastic wrap (Patient not taking: Reported on 01/01/2017) 30 g 6  . omeprazole (PRILOSEC) 40 MG capsule Take 1 capsule (40 mg total) by mouth daily. (Patient not taking: Reported on  01/01/2017) 90 capsule 3   No current facility-administered medications for this encounter.     Physical Findings:  weight is 221 lb (100.2 kg). His oral temperature is 97.6 F (36.4 C). His blood pressure is 137/70 and his pulse is 72. His respiration is 20 and oxygen saturation is 100%.  Pain Assessment Pain Score: 4  Pain Loc: Abdomen/10 In general this is a well appearing African-American male in no acute distress. He's alert and oriented x4 and appropriate throughout the examination. Cardiopulmonary assessment is negative for acute distress and he exhibits normal effort.    Lab Findings: Lab Results  Component Value Date   WBC 5.3 10/20/2016   HGB 11.8 (L) 10/20/2016   HCT 36.7 (L) 10/20/2016   MCV 83.2 10/20/2016   PLT 145 10/20/2016     Radiographic Findings: Ct Chest Wo Contrast  Result Date: 12/12/2016 CLINICAL DATA:  Followup right upper lobe pulmonary nodule. Gastric carcinoma. Recently completed chemotherapy and radiation therapy. EXAM: CT CHEST WITHOUT CONTRAST TECHNIQUE: Multidetector CT imaging of the chest was performed following the standard protocol without IV contrast. COMPARISON:  10/20/2016 FINDINGS: Cardiovascular: No acute findings. Transvenous pacemaker. Aortic and coronary artery atherosclerosis. Mediastinum/Nodes: No masses or pathologically enlarged lymph nodes identified on this unenhanced exam. Small hiatal hernia again noted. Lungs/Pleura: Mild centrilobular and paraseptal emphysema again noted. Pulmonary nodule in the medial right upper lobe currently measures 1.9 cm on image 37/7 compared to 2.3 cm previously. Other scattered sub-cm bilateral pulmonary nodules show no significant change. No evidence of pulmonary consolidation or pleural effusion. Upper Abdomen: New ascites seen in the perihepatic spaces. Tiny calcified gallstones again noted. Musculoskeletal:  No suspicious bone lesions. IMPRESSION: Mild decrease in size of dominant pulmonary nodule in medial right upper lobe. Other bilateral sub-cm pulmonary nodules show no significant change. New abdominal ascites. Stable small hiatal hernia and cholelithiasis. Aortic Atherosclerosis (ICD10-I70.0) and Emphysema (ICD10-J43.9). Electronically Signed   By: Earle Gell M.D.   On: 12/12/2016 15:25   US Paracentesis  Result Date: 12/26/2016 INDICATION: Patient with history of gastric cancer, now with ascites. Request is made for diagnostic and therapeutic paracentesis. EXAM: ULTRASOUND GUIDED DIAGNOSTIC AND THERAPEUTIC PARACENTESIS MEDICATIONS: 10 mL 1% lidocaine COMPLICATIONS: None  immediate. PROCEDURE: Informed written consent was obtained from the patient after a discussion of the risks, benefits and alternatives to treatment. A timeout was performed prior to the initiation of the procedure. Initial ultrasound scanning demonstrates a moderate amount of ascites within the right lower abdominal quadrant. The right lateral abdomen was prepped and draped in the usual sterile fashion. 1% lidocaine with epinephrine was used for local anesthesia. Following this, a 19 gauge, 10-cm, Yueh catheter was introduced. An ultrasound image was saved for documentation purposes. The paracentesis was performed. The catheter was removed and a dressing was applied. The patient tolerated the procedure well without immediate post procedural complication. FINDINGS: A total of approximately 2.8 liters of amber fluid was removed. Samples were sent to the laboratory as requested by the clinical team. IMPRESSION: Successful ultrasound-guided diagnostic and therapeutic paracentesis yielding 2.8 liters of peritoneal fluid. Read by:  Brynda Greathouse PA-C Electronically Signed   By: Jerilynn Mages.  Shick M.D.   On: 12/26/2016 11:36    Impression/Plan: 1. Metastatic adenocarcinoma of the stomach with right upper lobe mass. The patient will continue under the care of Dr. [name] for long term follow up. His most recent scan is showing features to indicate response in the right upper lobe. We will return as needed moving forward.I  encouraged the patient to continue reaching out to social work for his wife as well. The process as she will be making decisions regarding his own treatment. We would be happy to see him back as needed moving forward.     Carola Rhine, PAC

## 2017-01-09 ENCOUNTER — Telehealth: Payer: Self-pay | Admitting: *Deleted

## 2017-01-09 DIAGNOSIS — C169 Malignant neoplasm of stomach, unspecified: Secondary | ICD-10-CM

## 2017-01-09 NOTE — Telephone Encounter (Signed)
Call from pt requesting paracentesis. Reviewed with Dr. [name]. Verbal order received. Spoke with [name] in [employer], requested they contact pt.

## 2017-01-09 NOTE — Addendum Note (Signed)
Addended by: Brien Few on: 01/09/2017 10:34 AM   Modules accepted: Orders

## 2017-01-12 ENCOUNTER — Encounter (HOSPITAL_COMMUNITY): Payer: Self-pay | Admitting: Interventional Radiology

## 2017-01-12 ENCOUNTER — Ambulatory Visit (HOSPITAL_COMMUNITY)
Admission: RE | Admit: 2017-01-12 | Discharge: 2017-01-12 | Disposition: A | Discharge status: Home / Self Care | Payer: Medicare HMO | Source: Ambulatory Visit | Attending: Oncology | Admitting: Oncology

## 2017-01-12 ENCOUNTER — Telehealth: Payer: Self-pay | Admitting: Oncology

## 2017-01-12 DIAGNOSIS — Z85028 Personal history of other malignant neoplasm of stomach: Secondary | ICD-10-CM | POA: Diagnosis not present

## 2017-01-12 DIAGNOSIS — R188 Other ascites: Secondary | ICD-10-CM | POA: Diagnosis not present

## 2017-01-12 DIAGNOSIS — C169 Malignant neoplasm of stomach, unspecified: Secondary | ICD-10-CM

## 2017-01-12 DIAGNOSIS — R180 Malignant ascites: Secondary | ICD-10-CM | POA: Insufficient documentation

## 2017-01-12 HISTORY — PX: IR PARACENTESIS: IMG2679

## 2017-01-12 MED ORDER — LIDOCAINE HCL (PF) 1 % IJ SOLN
INTRAMUSCULAR | Status: DC | PRN
  Administered 2017-01-12: 10 mL

## 2017-01-12 MED ORDER — LIDOCAINE HCL (PF) 1 % IJ SOLN
INTRAMUSCULAR | Status: AC
  Filled 2017-01-12: qty 30

## 2017-01-12 NOTE — Procedures (Signed)
PROCEDURE SUMMARY:  Successful US guided paracentesis from right lateral abdomen.  Yielded 3.3 liters of amber fluid.  No immediate complications.  Pt tolerated well.   Specimen was not sent for labs.  Docia Barrier PA-C 01/12/2017 10:57 AM

## 2017-01-12 NOTE — Telephone Encounter (Signed)
Unable to reach patient or wife to r/s 9/12 appt per sch msg

## 2017-01-14 ENCOUNTER — Ambulatory Visit: Payer: Medicare HMO | Admitting: Oncology

## 2017-01-18 ENCOUNTER — Inpatient Hospital Stay (HOSPITAL_COMMUNITY)
Admission: EM | Admit: 2017-01-18 | Discharge: 2017-01-22 | DRG: 374 | Disposition: A | Discharge status: Hospice | Payer: Medicare HMO | Attending: Internal Medicine | Admitting: Internal Medicine

## 2017-01-18 ENCOUNTER — Encounter (HOSPITAL_COMMUNITY): Payer: Self-pay | Admitting: Emergency Medicine

## 2017-01-18 DIAGNOSIS — K56609 Unspecified intestinal obstruction, unspecified as to partial versus complete obstruction: Secondary | ICD-10-CM | POA: Diagnosis not present

## 2017-01-18 DIAGNOSIS — H9192 Unspecified hearing loss, left ear: Secondary | ICD-10-CM | POA: Diagnosis present

## 2017-01-18 DIAGNOSIS — G620 Drug-induced polyneuropathy: Secondary | ICD-10-CM | POA: Diagnosis not present

## 2017-01-18 DIAGNOSIS — C169 Malignant neoplasm of stomach, unspecified: Secondary | ICD-10-CM | POA: Diagnosis present

## 2017-01-18 DIAGNOSIS — I10 Essential (primary) hypertension: Secondary | ICD-10-CM | POA: Diagnosis present

## 2017-01-18 DIAGNOSIS — R0902 Hypoxemia: Secondary | ICD-10-CM | POA: Diagnosis not present

## 2017-01-18 DIAGNOSIS — D62 Acute posthemorrhagic anemia: Secondary | ICD-10-CM | POA: Diagnosis present

## 2017-01-18 DIAGNOSIS — Z66 Do not resuscitate: Secondary | ICD-10-CM | POA: Diagnosis not present

## 2017-01-18 DIAGNOSIS — R112 Nausea with vomiting, unspecified: Secondary | ICD-10-CM

## 2017-01-18 DIAGNOSIS — Z961 Presence of intraocular lens: Secondary | ICD-10-CM | POA: Diagnosis present

## 2017-01-18 DIAGNOSIS — Z9842 Cataract extraction status, left eye: Secondary | ICD-10-CM | POA: Diagnosis not present

## 2017-01-18 DIAGNOSIS — K210 Gastro-esophageal reflux disease with esophagitis: Secondary | ICD-10-CM | POA: Diagnosis present

## 2017-01-18 DIAGNOSIS — K922 Gastrointestinal hemorrhage, unspecified: Secondary | ICD-10-CM | POA: Diagnosis present

## 2017-01-18 DIAGNOSIS — Z86010 Personal history of colonic polyps: Secondary | ICD-10-CM | POA: Diagnosis not present

## 2017-01-18 DIAGNOSIS — Z823 Family history of stroke: Secondary | ICD-10-CM

## 2017-01-18 DIAGNOSIS — Z7951 Long term (current) use of inhaled steroids: Secondary | ICD-10-CM

## 2017-01-18 DIAGNOSIS — N179 Acute kidney failure, unspecified: Secondary | ICD-10-CM | POA: Diagnosis present

## 2017-01-18 DIAGNOSIS — Z91048 Other nonmedicinal substance allergy status: Secondary | ICD-10-CM

## 2017-01-18 DIAGNOSIS — E785 Hyperlipidemia, unspecified: Secondary | ICD-10-CM | POA: Diagnosis present

## 2017-01-18 DIAGNOSIS — C162 Malignant neoplasm of body of stomach: Secondary | ICD-10-CM | POA: Diagnosis not present

## 2017-01-18 DIAGNOSIS — J690 Pneumonitis due to inhalation of food and vomit: Secondary | ICD-10-CM | POA: Diagnosis present

## 2017-01-18 DIAGNOSIS — I447 Left bundle-branch block, unspecified: Secondary | ICD-10-CM | POA: Diagnosis present

## 2017-01-18 DIAGNOSIS — C164 Malignant neoplasm of pylorus: Secondary | ICD-10-CM | POA: Diagnosis present

## 2017-01-18 DIAGNOSIS — K449 Diaphragmatic hernia without obstruction or gangrene: Secondary | ICD-10-CM | POA: Diagnosis not present

## 2017-01-18 DIAGNOSIS — Z87891 Personal history of nicotine dependence: Secondary | ICD-10-CM | POA: Diagnosis not present

## 2017-01-18 DIAGNOSIS — Z79899 Other long term (current) drug therapy: Secondary | ICD-10-CM

## 2017-01-18 DIAGNOSIS — J449 Chronic obstructive pulmonary disease, unspecified: Secondary | ICD-10-CM | POA: Diagnosis present

## 2017-01-18 DIAGNOSIS — Z9841 Cataract extraction status, right eye: Secondary | ICD-10-CM | POA: Diagnosis not present

## 2017-01-18 DIAGNOSIS — I5022 Chronic systolic (congestive) heart failure: Secondary | ICD-10-CM | POA: Diagnosis present

## 2017-01-18 DIAGNOSIS — I429 Cardiomyopathy, unspecified: Secondary | ICD-10-CM | POA: Diagnosis present

## 2017-01-18 DIAGNOSIS — D509 Iron deficiency anemia, unspecified: Secondary | ICD-10-CM | POA: Diagnosis not present

## 2017-01-18 DIAGNOSIS — Z8261 Family history of arthritis: Secondary | ICD-10-CM

## 2017-01-18 DIAGNOSIS — M1990 Unspecified osteoarthritis, unspecified site: Secondary | ICD-10-CM | POA: Diagnosis present

## 2017-01-18 DIAGNOSIS — K311 Adult hypertrophic pyloric stenosis: Secondary | ICD-10-CM | POA: Diagnosis present

## 2017-01-18 DIAGNOSIS — Z8042 Family history of malignant neoplasm of prostate: Secondary | ICD-10-CM

## 2017-01-18 DIAGNOSIS — R180 Malignant ascites: Secondary | ICD-10-CM | POA: Diagnosis present

## 2017-01-18 DIAGNOSIS — Z888 Allergy status to other drugs, medicaments and biological substances status: Secondary | ICD-10-CM | POA: Diagnosis not present

## 2017-01-18 DIAGNOSIS — C163 Malignant neoplasm of pyloric antrum: Secondary | ICD-10-CM | POA: Diagnosis not present

## 2017-01-18 DIAGNOSIS — Z95810 Presence of automatic (implantable) cardiac defibrillator: Secondary | ICD-10-CM | POA: Diagnosis not present

## 2017-01-18 DIAGNOSIS — D701 Agranulocytosis secondary to cancer chemotherapy: Secondary | ICD-10-CM | POA: Diagnosis not present

## 2017-01-18 DIAGNOSIS — I110 Hypertensive heart disease with heart failure: Secondary | ICD-10-CM | POA: Diagnosis present

## 2017-01-18 DIAGNOSIS — E119 Type 2 diabetes mellitus without complications: Secondary | ICD-10-CM | POA: Diagnosis present

## 2017-01-18 DIAGNOSIS — R0789 Other chest pain: Secondary | ICD-10-CM

## 2017-01-18 DIAGNOSIS — Z8249 Family history of ischemic heart disease and other diseases of the circulatory system: Secondary | ICD-10-CM

## 2017-01-18 DIAGNOSIS — R634 Abnormal weight loss: Secondary | ICD-10-CM | POA: Diagnosis present

## 2017-01-18 DIAGNOSIS — R188 Other ascites: Secondary | ICD-10-CM | POA: Diagnosis not present

## 2017-01-18 DIAGNOSIS — Z9221 Personal history of antineoplastic chemotherapy: Secondary | ICD-10-CM

## 2017-01-18 DIAGNOSIS — K920 Hematemesis: Secondary | ICD-10-CM | POA: Diagnosis present

## 2017-01-18 DIAGNOSIS — I428 Other cardiomyopathies: Secondary | ICD-10-CM

## 2017-01-18 DIAGNOSIS — Z8546 Personal history of malignant neoplasm of prostate: Secondary | ICD-10-CM | POA: Diagnosis not present

## 2017-01-18 DIAGNOSIS — Z923 Personal history of irradiation: Secondary | ICD-10-CM

## 2017-01-18 DIAGNOSIS — C7972 Secondary malignant neoplasm of left adrenal gland: Secondary | ICD-10-CM | POA: Diagnosis not present

## 2017-01-18 DIAGNOSIS — K209 Esophagitis, unspecified: Secondary | ICD-10-CM | POA: Diagnosis not present

## 2017-01-18 LAB — COMPREHENSIVE METABOLIC PANEL
ALK PHOS: 121 U/L (ref 38–126)
ALT: 19 U/L (ref 17–63)
ANION GAP: 11 (ref 5–15)
AST: 38 U/L (ref 15–41)
Albumin: 2.9 g/dL — ABNORMAL LOW (ref 3.5–5.0)
BILIRUBIN TOTAL: 0.8 mg/dL (ref 0.3–1.2)
BUN: 29 mg/dL — ABNORMAL HIGH (ref 6–20)
CALCIUM: 8.9 mg/dL (ref 8.9–10.3)
CO2: 28 mmol/L (ref 22–32)
Chloride: 99 mmol/L — ABNORMAL LOW (ref 101–111)
Creatinine, Ser: 1.76 mg/dL — ABNORMAL HIGH (ref 0.61–1.24)
GFR calc non Af Amer: 35 mL/min — ABNORMAL LOW (ref >60)
GFR, EST AFRICAN AMERICAN: 41 mL/min — AB (ref >60)
Glucose, Bld: 127 mg/dL — ABNORMAL HIGH (ref 65–99)
Potassium: 5.1 mmol/L (ref 3.5–5.1)
SODIUM: 138 mmol/L (ref 135–145)
TOTAL PROTEIN: 7.4 g/dL (ref 6.5–8.1)

## 2017-01-18 LAB — CBC
HCT: 36.5 % — ABNORMAL LOW (ref 39.0–52.0)
HCT: 33.4 % — ABNORMAL LOW (ref 39.0–52.0)
HEMOGLOBIN: 11.8 g/dL — AB (ref 13.0–17.0)
HEMOGLOBIN: 10.8 g/dL — AB (ref 13.0–17.0)
MCH: 26.5 pg (ref 26.0–34.0)
MCH: 26.8 pg (ref 26.0–34.0)
MCHC: 32.3 g/dL (ref 30.0–36.0)
MCHC: 32.3 g/dL (ref 30.0–36.0)
MCV: 82 fL (ref 78.0–100.0)
MCV: 82.9 fL (ref 78.0–100.0)
Platelets: 258 10*3/uL (ref 150–400)
Platelets: 247 10*3/uL (ref 150–400)
RBC: 4.45 MIL/uL (ref 4.22–5.81)
RBC: 4.03 MIL/uL — ABNORMAL LOW (ref 4.22–5.81)
RDW: 16.2 % — ABNORMAL HIGH (ref 11.5–15.5)
RDW: 16.2 % — ABNORMAL HIGH (ref 11.5–15.5)
WBC: 6.8 10*3/uL (ref 4.0–10.5)
WBC: 6.5 10*3/uL (ref 4.0–10.5)

## 2017-01-18 LAB — PROTIME-INR
INR: 1.03
PROTHROMBIN TIME: 13.4 s (ref 11.4–15.2)

## 2017-01-18 LAB — LIPASE, BLOOD: Lipase: 37 U/L (ref 11–51)

## 2017-01-18 MED ORDER — MOMETASONE FURO-FORMOTEROL FUM 200-5 MCG/ACT IN AERO
2.0000 | INHALATION_SPRAY | Freq: Two times a day (BID) | RESPIRATORY_TRACT | Status: DC
  Administered 2017-01-19 – 2017-01-22 (×5): 2 via RESPIRATORY_TRACT
  Filled 2017-01-18: qty 8.8

## 2017-01-18 MED ORDER — ONDANSETRON HCL 4 MG PO TABS: 4.0000 mg | ORAL_TABLET | Freq: Four times a day (QID) | ORAL | Status: DC | PRN

## 2017-01-18 MED ORDER — ACETAMINOPHEN 650 MG RE SUPP: 650.0000 mg | Freq: Four times a day (QID) | RECTAL | Status: DC | PRN

## 2017-01-18 MED ORDER — PANTOPRAZOLE SODIUM 40 MG IV SOLR: 40.0000 mg | Freq: Two times a day (BID) | INTRAVENOUS | Status: DC

## 2017-01-18 MED ORDER — THEOPHYLLINE ER 400 MG PO TB24
400.0000 mg | ORAL_TABLET | Freq: Every day | ORAL | Status: DC
  Administered 2017-01-19 – 2017-01-22 (×4): 400 mg via ORAL
  Filled 2017-01-18 (×4): qty 1

## 2017-01-18 MED ORDER — PANTOPRAZOLE SODIUM 40 MG IV SOLR
40.0000 mg | Freq: Once | INTRAVENOUS | Status: AC
  Administered 2017-01-18: 40 mg via INTRAVENOUS
  Filled 2017-01-18: qty 40

## 2017-01-18 MED ORDER — HYDRALAZINE HCL 20 MG/ML IJ SOLN: 10.0000 mg | INTRAMUSCULAR | Status: DC | PRN

## 2017-01-18 MED ORDER — ONDANSETRON HCL 4 MG/2ML IJ SOLN
4.0000 mg | Freq: Four times a day (QID) | INTRAMUSCULAR | Status: DC | PRN
  Administered 2017-01-20 – 2017-01-21 (×2): 4 mg via INTRAVENOUS
  Filled 2017-01-18 (×3): qty 2

## 2017-01-18 MED ORDER — ACETAMINOPHEN 325 MG PO TABS: 650.0000 mg | ORAL_TABLET | Freq: Four times a day (QID) | ORAL | Status: DC | PRN

## 2017-01-18 MED ORDER — SODIUM CHLORIDE 0.9 % IV BOLUS (SEPSIS)
1000.0000 mL | Freq: Once | INTRAVENOUS | Status: AC
  Administered 2017-01-18: 1000 mL via INTRAVENOUS

## 2017-01-18 MED ORDER — ONDANSETRON HCL 4 MG/2ML IJ SOLN
4.0000 mg | Freq: Once | INTRAMUSCULAR | Status: AC
  Administered 2017-01-18: 4 mg via INTRAVENOUS
  Filled 2017-01-18: qty 2

## 2017-01-18 MED ORDER — CARVEDILOL 6.25 MG PO TABS
6.2500 mg | ORAL_TABLET | Freq: Two times a day (BID) | ORAL | Status: DC
  Administered 2017-01-19 – 2017-01-22 (×7): 6.25 mg via ORAL
  Filled 2017-01-18 (×7): qty 1

## 2017-01-18 NOTE — ED Notes (Signed)
Please call report Mechele Claude [phone] at [phone]

## 2017-01-18 NOTE — ED Notes (Signed)
ED Provider at bedside. 

## 2017-01-18 NOTE — ED Notes (Signed)
Pt c/o nausea and vomiting x2 days, pt of the [facility]

## 2017-01-18 NOTE — ED Notes (Signed)
Family at bedside. 

## 2017-01-18 NOTE — ED Provider Notes (Signed)
[facility] DEPT Provider Note   CSN: [id number] Arrival date & time: 01/18/17  1801     History   Chief Complaint Chief Complaint  Patient presents with  . Emesis    HPI [name] is a 82 y.o. male with past medical history of CHF, COPD, hypertension, hyperlipidemia, adenocarcinoma of the stomach with metastasis, prostate cancer, presenting with persistent emesis since Monday evening. Patient reports on Monday he had a therapeutic paracentesis for ascites secondary to malignancy in his abdomen, and that evening began vomiting. He reports taking Compazine for nausea at home, however has been unable to keep it down and has been vomiting after every meal. Today he noted dark/black emesis. He reports calling the nurse that his oncology office, who recommended he reports to the ED. Reports associated generalized abdominal soreness, slightly worsening shortness of breath from baseline. He denies history of coffee-ground emesis, denies fever, chills, chest pain, urinary symptoms, or other complaints today. Last chemotherapy/radiation treatment was multiple months ago per patient.  The history is provided by the patient and a relative.    Past Medical History:  Diagnosis Date  . AICD (automatic cardioverter/defibrillator) present   . Anemia   . Arthritis       . Asthma   . Chronic systolic heart failure ([place])   . Colon polyps   . COPD (chronic obstructive pulmonary disease) (HCC)    FEV1 2.68/72%, FEV1/FVC 0.63 (08/22/04  . History of blood transfusion   . HTN (hypertension)   . Hyperlipidemia   . LBBB (left bundle branch block)    [name] 06/20/2014  . Malignant neoplasm pyloric antrum ([place])   . Non-ischemic cardiomyopathy ([place])    a. s/p STJ CRTD 07/2014  . Pneumonia   . Primary cancer of stomach with metastasis to other site ([place]) 07/12/2015  . Prostate cancer ([place]) 2010  . Shortness of breath dyspnea     Patient Active Problem List   Diagnosis Date Noted  . Acute  GI bleeding 01/18/2017  . Malignant neoplasm of bronchus of right upper lobe ([place]) 10/30/2016  . Goals of care, counseling/discussion 08/19/2016  . Adenocarcinoma of gastric cardia ([place]) 08/18/2016  . Partial gastric outlet obstruction   . Gastroesophageal reflux disease with esophagitis 02/18/2016  . Port catheter in place 09/06/2015  . Primary cancer of stomach with metastasis to other site ([place]) 07/12/2015  . Gastric cancer ([place]) 06/27/2015  . Anemia, iron deficiency   . Gastric mass   . Non-ischemic cardiomyopathy ([place]) 06/06/2015  . Iron deficiency anemia 06/04/2015  . Seasonal and perennial allergic rhinitis 08/13/2014  . ICD (implantable cardioverter-defibrillator), biventricular, in situ 07/17/2014  . Chronic systolic heart failure ([place]) 06/20/2014  . H/O prostate cancer 05/31/2012  . Routine general medical examination at a health care facility 05/31/2012  . Cervical radiculitis 03/19/2011  . Type 2 diabetes mellitus with complications ([place]) 32/20/2542  . Left bundle branch block (LBBB) on electrocardiogram 04/27/2008  . Hyperlipidemia with target LDL less than 100 04/05/2008  . Essential hypertension 04/05/2008  . COPD mixed type ([place]) 06/02/2007    Past Surgical History:  Procedure Laterality Date  . BI-VENTRICULAR IMPLANTABLE CARDIOVERTER DEFIBRILLATOR N/A 07/17/2014   STJ CRTD implanted by Dr Lovena Le  . CATARACT EXTRACTION W/ INTRAOCULAR LENS  IMPLANT, BILATERAL Bilateral 2010  . COLONOSCOPY    . ESOPHAGOGASTRODUODENOSCOPY (EGD) WITH PROPOFOL N/A 06/27/2015   Procedure: ESOPHAGOGASTRODUODENOSCOPY (EGD) WITH PROPOFOL;  Surgeon: Manus Gunning, MD;  Location: [facility];  Service: Gastroenterology;  Laterality: N/A;  .  ESOPHAGOGASTRODUODENOSCOPY (EGD) WITH PROPOFOL N/A 08/13/2016   Procedure: ESOPHAGOGASTRODUODENOSCOPY (EGD) WITH PROPOFOL;  Surgeon: Manus Gunning, MD;  [facility];  Service: Gastroenterology;  Laterality: N/A;  . IR  PARACENTESIS  01/12/2017  . LEFT HEART CATHETERIZATION WITH CORONARY ANGIOGRAM N/A 01/04/2014   no obstructive CAD  . PORTACATH PLACEMENT N/A 07/11/2015   Procedure: INSERTION PORT-A-CATH WITH ULTRA SOUND GUIDANCE;  Surgeon: Stark Klein, MD;  Location: [facility];  Service: General;  Laterality: N/A;  . PROSTATECTOMY    . ROBOT ASSISTED LAPAROSCOPIC RADICAL PROSTATECTOMY  2010       Home Medications    Prior to Admission medications   Medication Sig Start Date End Date Taking? Authorizing Provider  acetaminophen (TYLENOL) 500 MG tablet Take 1,000 mg by mouth daily as needed for fever or headache.   Yes [provider]  amLODipine (NORVASC) 10 MG tablet TAKE 1 TABLET(10 MG) BY MOUTH DAILY 10/16/16  Yes Janith Lima, MD  atorvastatin (LIPITOR) 40 MG tablet TAKE 1 TABLET BY MOUTH DAILY 09/04/16  Yes Fay Records, MD  budesonide-formoterol [facility]) 160-4.5 MCG/ACT inhaler Inhale 2 puffs then rinse mouth, twice daily 02/11/16  Yes Young, Clinton D, MD  carvedilol (COREG) 6.25 MG tablet TAKE 1 TABLET(6.25 MG) BY MOUTH TWICE DAILY WITH A MEAL 11/16/16  Yes Janith Lima, MD  Fe Cbn-Fe Gluc-FA-B12-C-DSS (FERRALET 90) 90-1 MG TABS Take 1 tablet by mouth daily. 07/09/15  Yes [provider]  irbesartan (AVAPRO) 300 MG tablet Take 1 tablet (300 mg total) by mouth daily. 12/04/16  Yes Janith Lima, MD  lidocaine-prilocaine (EMLA) cream Apply 1 application topically as needed. Apply to [facility] 1 hour prior to stick and cover w/plastic wrap 07/11/15  Yes Ladell Pier, MD  omeprazole (PRILOSEC) 40 MG capsule Take 1 capsule (40 mg total) by mouth daily. 02/18/16  Yes Janith Lima, MD  Polyethyl Glycol-Propyl Glycol (SYSTANE OP) Place 1 drop into both eyes daily.    Yes [provider]  potassium chloride SA (K-DUR,KLOR-CON) 20 MEQ tablet Take 1 tablet (20 mEq total) by mouth daily. 06/10/16  Yes Owens Shark, NP  theophylline (UNIPHYL) 400 MG 24 hr tablet Take 400 mg by mouth daily.    Yes [provider]  HYDROcodone-homatropine (HYCODAN) 5-1.5 MG/5ML syrup Take 5 mLs by mouth every 6 (six) hours as needed for cough. Patient not taking: Reported on 01/18/2017 08/12/16   Deneise Lever, MD  prochlorperazine (COMPAZINE) 10 MG tablet Take 1 tablet (10 mg total) by mouth every 6 (six) hours as needed for nausea or vomiting. Patient not taking: Reported on 01/18/2017 12/29/16   Ladell Pier, MD    Family History Family History  Problem Relation Age of Onset  . Arthritis Other   . Esophagitis Mother        STRICTURE  . Stroke Mother   . Hypertension Mother   . Prostate cancer Father   . Hypertension Father   . Heart attack Neg Hx     Social History Social History  Substance Use Topics  . Smoking status: Former Smoker    Packs/day: 1.50    Years: 40.00    Types: Cigarettes    Quit date: 05/05/2002  . Smokeless tobacco: Never Used  . Alcohol use No     Allergies   Adhesive [tape] and Other   Review of Systems Review of Systems  Constitutional: Negative for chills and fever.  Respiratory: Positive for shortness of breath.   Cardiovascular:  Negative for chest pain.  Gastrointestinal: Positive for abdominal pain, nausea and vomiting. Negative for constipation and diarrhea.  Genitourinary: Negative for dysuria and frequency.  All other systems reviewed and are negative.    Physical Exam Updated Vital Signs BP 128/80   Pulse 94   Temp (!) 97.5 F (36.4 C) (Oral)   Resp 15   SpO2 92%   Physical Exam  Constitutional: He appears well-developed and well-nourished. No distress.  HENT:  Head: Normocephalic and atraumatic.  Eyes: Conjunctivae are normal.  Cardiovascular: Normal rate, regular rhythm and intact distal pulses.   Pulmonary/Chest: Effort normal. No respiratory distress. He has no wheezes. He has no rales.  Rhonchi bilaterally R>L  Abdominal: Soft. Bowel sounds are normal. He exhibits fluid wave, ascites (mild amount of ascites  notes) and mass (firm area in epigastrum and just left of umbilicus, pt reports chronic). There is tenderness (generalized, reported as discomfort). There is no rebound and no guarding.  Neurological: He is alert.  Skin: Skin is warm.  Psychiatric: He has a normal mood and affect. His behavior is normal.  Nursing note and vitals reviewed.    ED Treatments / Results  Labs (all labs ordered are listed, but only abnormal results are displayed) Labs Reviewed  COMPREHENSIVE METABOLIC PANEL - Abnormal; Notable for the following:       Result Value   Chloride 99 (*)    Glucose, Bld 127 (*)    BUN 29 (*)    Creatinine, Ser 1.76 (*)    Albumin 2.9 (*)    GFR calc non Af Amer 35 (*)    GFR calc Af Amer 41 (*)    All other components within normal limits  CBC - Abnormal; Notable for the following:    Hemoglobin 11.8 (*)    HCT 36.5 (*)    RDW 16.2 (*)    All other components within normal limits  LIPASE, BLOOD  PROTIME-INR  URINALYSIS, ROUTINE W REFLEX MICROSCOPIC  TYPE AND SCREEN    EKG  EKG Interpretation None       Radiology No results found.  Procedures Procedures (including critical care time)  Medications Ordered in ED Medications  sodium chloride 0.9 % bolus 1,000 mL (0 mLs Intravenous Stopped 01/18/17 2134)  ondansetron (ZOFRAN) injection 4 mg (4 mg Intravenous Given 01/18/17 2010)  pantoprazole (PROTONIX) injection 40 mg (40 mg Intravenous Given 01/18/17 2134)     Initial Impression / Assessment and Plan / ED Course  I have reviewed the triage vital signs and the nursing notes.  Pertinent labs & imaging results that were available during my care of the patient were reviewed by me and considered in my medical decision making (see chart for details).     Pt with PMHx of metastatic adenocarcinoma of the stomach, presenting with emesis since Monday evening, with coffee-ground emesis beginning today.  Patient not actively receiving chemo or radiation treatment.  Patient is hemodynamically stable and afebrile. Hemoglobin at baseline, 11.8. CMP showing AKI with creatinine of 1.76, as well as elevated BUN at 29. WBC normal. PT-INR normal. IV fluid bolus given, Zofran and Protonix for symptoms with improvement in nausea. About a GI consult, spoke with Dr. Hilarie Fredrickson, who will see patient in the morning. Dr. Hal Hope with [facility] accepting admission.  Patient discussed with and seen by Dr. Ellender Hose, who agrees with care plan.  The patient appears reasonably stabilized for admission considering the current resources, flow, and capabilities available in the ED at this time,  and I doubt any other EMC requiring further screening and/or treatment in the ED prior to admission.  Final Clinical Impressions(s) / ED Diagnoses   Final diagnoses:  Acute upper GI bleed  Acute kidney injury [facility])    New Prescriptions New Prescriptions   No medications on file     Russo, [place] N, PA-C 01/18/17 2201    Duffy Bruce, MD 01/19/17 [phone]

## 2017-01-18 NOTE — ED Triage Notes (Signed)
Patient BIB family, reports hx stomach cancer mets, reports having "stomach drained" on Monday, and unable to tolerate PO fluids and food since that time. Family notes emesis is "dark, almost black." Denies diarrhea. Patient is no longer receiving treatments. States last chemo and radiation "months ago."

## 2017-01-18 NOTE — ED Notes (Signed)
Pt waiting to be admitted

## 2017-01-18 NOTE — H&P (Signed)
History and Physical    [name]:[id number] DOB: 09/29/1934 DOA: 01/18/2017  PCP: Janith Lima, MD  Patient coming from: Home.  Chief Complaint: Hematemesis.  HPI: [name] is a 82 y.o. male with history of gastric adenocarcinoma and lung mass had undergone chemotherapy and radiation therapy with history of prostate cancer in remission who has had paracentesis last week for ascites and since then patient has been having persistent vomiting. Patient vomitus has been coffee-ground. Denies any diarrhea. Has been having some abdominal discomfort which has been diffuse.   ED Course: In the ER patient's hemoglobin appears to be at baseline. Creatinine has worsened from baseline. Patient is hemodynamically stable. Was started on IV Protonix. Patient is being admitted for further management of acute GI bleed. [place] gastroenterologist Dr. Hilarie Fredrickson was notified by the ER physician.  Review of Systems: As per HPI, rest all negative.   Past Medical History:  Diagnosis Date  . AICD (automatic cardioverter/defibrillator) present   . Anemia   . Arthritis       . Asthma   . Chronic systolic heart failure ([place])   . Colon polyps   . COPD (chronic obstructive pulmonary disease) (HCC)    FEV1 2.68/72%, FEV1/FVC 0.63 (08/22/04  . History of blood transfusion   . HTN (hypertension)   . Hyperlipidemia   . LBBB (left bundle branch block)    [name] 06/20/2014  . Malignant neoplasm pyloric antrum ([place])   . Non-ischemic cardiomyopathy ([place])    a. s/p STJ CRTD 07/2014  . Pneumonia   . Primary cancer of stomach with metastasis to other site ([place]) 07/12/2015  . Prostate cancer ([place]) 2010  . Shortness of breath dyspnea     Past Surgical History:  Procedure Laterality Date  . BI-VENTRICULAR IMPLANTABLE CARDIOVERTER DEFIBRILLATOR N/A 07/17/2014   STJ CRTD implanted by Dr Lovena Le  . CATARACT EXTRACTION W/ INTRAOCULAR LENS  IMPLANT, BILATERAL Bilateral 2010  . COLONOSCOPY    .  ESOPHAGOGASTRODUODENOSCOPY (EGD) WITH PROPOFOL N/A 06/27/2015   Procedure: ESOPHAGOGASTRODUODENOSCOPY (EGD) WITH PROPOFOL;  Surgeon: Manus Gunning, MD;  Location: [facility];  Service: Gastroenterology;  Laterality: N/A;  . ESOPHAGOGASTRODUODENOSCOPY (EGD) WITH PROPOFOL N/A 08/13/2016   Procedure: ESOPHAGOGASTRODUODENOSCOPY (EGD) WITH PROPOFOL;  Surgeon: Manus Gunning, MD;  [facility];  Service: Gastroenterology;  Laterality: N/A;  . IR PARACENTESIS  01/12/2017  . LEFT HEART CATHETERIZATION WITH CORONARY ANGIOGRAM N/A 01/04/2014   no obstructive CAD  . PORTACATH PLACEMENT N/A 07/11/2015   Procedure: INSERTION PORT-A-CATH WITH ULTRA SOUND GUIDANCE;  Surgeon: Stark Klein, MD;  [facility];  Service: General;  Laterality: N/A;  . PROSTATECTOMY    . ROBOT ASSISTED LAPAROSCOPIC RADICAL PROSTATECTOMY  2010     reports that he quit smoking about 14 years ago. His smoking use included Cigarettes. He has a 60.00 pack-year smoking history. He has never used smokeless tobacco. He reports that he does not drink alcohol or use drugs.  Allergies  Allergen Reactions  . Adhesive [Tape]     Reaction to sorbaview, opsite  . Other Rash    Patient allergic to Chlorehexidine     Family History  Problem Relation Age of Onset  . Arthritis Other   . Esophagitis Mother        STRICTURE  . Stroke Mother   . Hypertension Mother   . Prostate cancer Father   . Hypertension Father   . Heart attack Neg Hx     Prior to Admission medications  Medication Sig Start Date End Date Taking? Authorizing Provider  acetaminophen (TYLENOL) 500 MG tablet Take 1,000 mg by mouth daily as needed for fever or headache.   Yes [provider]  amLODipine (NORVASC) 10 MG tablet TAKE 1 TABLET(10 MG) BY MOUTH DAILY 10/16/16  Yes Janith Lima, MD  atorvastatin (LIPITOR) 40 MG tablet TAKE 1 TABLET BY MOUTH DAILY 09/04/16  Yes Fay Records, MD  budesonide-formoterol [facility]) 160-4.5  MCG/ACT inhaler Inhale 2 puffs then rinse mouth, twice daily 02/11/16  Yes Young, Clinton D, MD  carvedilol (COREG) 6.25 MG tablet TAKE 1 TABLET(6.25 MG) BY MOUTH TWICE DAILY WITH A MEAL 11/16/16  Yes Janith Lima, MD  Fe Cbn-Fe Gluc-FA-B12-C-DSS (FERRALET 90) 90-1 MG TABS Take 1 tablet by mouth daily. 07/09/15  Yes [provider]  irbesartan (AVAPRO) 300 MG tablet Take 1 tablet (300 mg total) by mouth daily. 12/04/16  Yes Janith Lima, MD  lidocaine-prilocaine (EMLA) cream Apply 1 application topically as needed. Apply to [facility] 1 hour prior to stick and cover w/plastic wrap 07/11/15  Yes Ladell Pier, MD  omeprazole (PRILOSEC) 40 MG capsule Take 1 capsule (40 mg total) by mouth daily. 02/18/16  Yes Janith Lima, MD  Polyethyl Glycol-Propyl Glycol (SYSTANE OP) Place 1 drop into both eyes daily.    Yes [provider]  potassium chloride SA (K-DUR,KLOR-CON) 20 MEQ tablet Take 1 tablet (20 mEq total) by mouth daily. 06/10/16  Yes Owens Shark, NP  theophylline (UNIPHYL) 400 MG 24 hr tablet Take 400 mg by mouth daily.   Yes [provider]  HYDROcodone-homatropine (HYCODAN) 5-1.5 MG/5ML syrup Take 5 mLs by mouth every 6 (six) hours as needed for cough. Patient not taking: Reported on 01/18/2017 08/12/16   Deneise Lever, MD  prochlorperazine (COMPAZINE) 10 MG tablet Take 1 tablet (10 mg total) by mouth every 6 (six) hours as needed for nausea or vomiting. Patient not taking: Reported on 01/18/2017 12/29/16   Ladell Pier, MD    Physical Exam: Vitals:   01/18/17 1826  BP: 128/80  Pulse: 94  Resp: 15  Temp: (!) 97.5 F (36.4 C)  TempSrc: Oral  SpO2: 92%      Constitutional: Moderately built and nourished. Vitals:   01/18/17 1826  BP: 128/80  Pulse: 94  Resp: 15  Temp: (!) 97.5 F (36.4 C)  TempSrc: Oral  SpO2: 92%   Eyes: Anicteric. No pallor. ENMT: No discharge from the ears eyes nose and mouth. Neck: No mass felt. No neck rigidity. No JVD  appreciated. Respiratory: No rhonchi or crepitations. Cardiovascular: S1-S2 regular no murmurs appreciated. Abdomen: Soft nontender bowel sounds present. Musculoskeletal: No edema. No joint effusion. Skin: No rash. Skin appears warm. Neurologic: Alert awake oriented to time place and person. Moves all extremities. Psychiatric: Appears normal. Normal affect.   Labs on Admission: I have personally reviewed following labs and imaging studies  CBC:  Recent Labs Lab 01/18/17 2000  WBC 6.8  HGB 11.8*  HCT 36.5*  MCV 82.0  PLT 416   Basic Metabolic Panel:  Recent Labs Lab 01/18/17 2000  NA 138  K 5.1  CL 99*  CO2 28  GLUCOSE 127*  BUN 29*  CREATININE 1.76*  CALCIUM 8.9   GFR: CrCl cannot be calculated (Unknown ideal weight.). Liver Function Tests:  Recent Labs Lab 01/18/17 2000  AST 38  ALT 19  ALKPHOS 121  BILITOT 0.8  PROT 7.4  ALBUMIN 2.9*    Recent  Labs Lab 01/18/17 2000  LIPASE 37   No results for input(s): AMMONIA in the last 168 hours. Coagulation Profile:  Recent Labs Lab 01/18/17 2000  INR 1.03   Cardiac Enzymes: No results for input(s): CKTOTAL, CKMB, CKMBINDEX, TROPONINI in the last 168 hours. BNP (last 3 results) No results for input(s): PROBNP in the last 8760 hours. HbA1C: No results for input(s): HGBA1C in the last 72 hours. CBG: No results for input(s): GLUCAP in the last 168 hours. Lipid Profile: No results for input(s): CHOL, HDL, LDLCALC, TRIG, CHOLHDL, LDLDIRECT in the last 72 hours. Thyroid Function Tests: No results for input(s): TSH, T4TOTAL, FREET4, T3FREE, THYROIDAB in the last 72 hours. Anemia Panel: No results for input(s): VITAMINB12, FOLATE, FERRITIN, TIBC, IRON, RETICCTPCT in the last 72 hours. Urine analysis:    Component Value Date/Time   COLORURINE YELLOW 02/18/2016 1351   APPEARANCEUR CLEAR 02/18/2016 1351   LABSPEC 1.025 02/18/2016 1351   PHURINE 6.0 02/18/2016 1351   GLUCOSEU NEGATIVE 02/18/2016 1351    HGBUR TRACE-INTACT (A) 02/18/2016 1351   BILIRUBINUR NEGATIVE 02/18/2016 1351   KETONESUR NEGATIVE 02/18/2016 1351   PROTEINUR NEGATIVE 07/24/2008 0920   UROBILINOGEN 1.0 02/18/2016 1351   NITRITE NEGATIVE 02/18/2016 1351   LEUKOCYTESUR NEGATIVE 02/18/2016 1351   Sepsis Labs: @LABRCNTIP (procalcitonin:4,lacticidven:4) )No results found for this or any previous visit (from the past 240 hour(s)).   Radiological Exams on Admission: No results found.   Assessment/Plan Principal Problem:   Acute GI bleeding Active Problems:   Essential hypertension   COPD mixed type ([place])   Non-ischemic cardiomyopathy ([place])   Primary cancer of stomach with metastasis to other site [facility])   Acute kidney injury ([place])    1. Acute GI bleed - with hematemesis. Patient will be kept on IV Protonix infusion. Patient denies taking any aspirin or NSAIDs. Will be kept nothing by mouth. Follow CBC. If hemoglobin is less than 7 will transfuse. [place] gastroenterologist was notified by ER physician. Will check KUB since patient has nausea vomiting. 2. Acute renal failure probably from nausea vomiting. Positive for metabolic panel hold ARB. 3. Acute blood loss anemia - follow CBC and transfuse if hemoglobin less than 7. 4. COPD not actively wheezing. On theophylline. 5. Nonischemic cardiomyopathy status post B IV pacemaker - appears compensated. Will continue Coreg. Hold ARB due to acute renal failure. 6. Hypertension when necessary hydralazine has been placed. 7. Adenocarcinoma of the stomach and lung mass and history of prostate cancer being followed by oncologist.  I have reviewed patient's old charts and labs.   DVT prophylaxis: SCDs. Code Status: Full code.  Family Communication: Discussed with patient.  Disposition Plan: Home.  Consults called: ER physician discussed her [place] gastroenterologist.  Admission status: Inpatient.    Rise Patience MD [facility] Pager [phone].  If  7PM-7AM, please contact night-coverage [email or web address] Password [facility]  01/18/2017, 11:29 PM

## 2017-01-18 NOTE — ED Notes (Signed)
Gave report to [employer] on 4E

## 2017-01-18 NOTE — ED Notes (Signed)
Attempted to call report; RN wants me to call back in 10 minutes

## 2017-01-18 NOTE — Progress Notes (Signed)
Patient arrived from the ED to room 1416. PCP was notified that the patient is on the floor.

## 2017-01-19 ENCOUNTER — Inpatient Hospital Stay (HOSPITAL_COMMUNITY): Payer: Medicare HMO

## 2017-01-19 DIAGNOSIS — K922 Gastrointestinal hemorrhage, unspecified: Secondary | ICD-10-CM | POA: Diagnosis not present

## 2017-01-19 DIAGNOSIS — K920 Hematemesis: Secondary | ICD-10-CM

## 2017-01-19 DIAGNOSIS — C7972 Secondary malignant neoplasm of left adrenal gland: Secondary | ICD-10-CM | POA: Diagnosis not present

## 2017-01-19 DIAGNOSIS — C163 Malignant neoplasm of pyloric antrum: Secondary | ICD-10-CM | POA: Diagnosis not present

## 2017-01-19 DIAGNOSIS — D509 Iron deficiency anemia, unspecified: Secondary | ICD-10-CM

## 2017-01-19 DIAGNOSIS — G620 Drug-induced polyneuropathy: Secondary | ICD-10-CM | POA: Diagnosis not present

## 2017-01-19 DIAGNOSIS — C162 Malignant neoplasm of body of stomach: Secondary | ICD-10-CM | POA: Diagnosis not present

## 2017-01-19 DIAGNOSIS — Z8546 Personal history of malignant neoplasm of prostate: Secondary | ICD-10-CM | POA: Diagnosis not present

## 2017-01-19 DIAGNOSIS — K209 Esophagitis, unspecified: Secondary | ICD-10-CM | POA: Diagnosis not present

## 2017-01-19 DIAGNOSIS — R112 Nausea with vomiting, unspecified: Secondary | ICD-10-CM

## 2017-01-19 DIAGNOSIS — D701 Agranulocytosis secondary to cancer chemotherapy: Secondary | ICD-10-CM

## 2017-01-19 DIAGNOSIS — I10 Essential (primary) hypertension: Secondary | ICD-10-CM | POA: Diagnosis not present

## 2017-01-19 DIAGNOSIS — K311 Adult hypertrophic pyloric stenosis: Secondary | ICD-10-CM | POA: Diagnosis not present

## 2017-01-19 LAB — CBC
HCT: 31.1 % — ABNORMAL LOW (ref 39.0–52.0)
HCT: 32.1 % — ABNORMAL LOW (ref 39.0–52.0)
Hemoglobin: 10 g/dL — ABNORMAL LOW (ref 13.0–17.0)
Hemoglobin: 10.2 g/dL — ABNORMAL LOW (ref 13.0–17.0)
MCH: 26 pg (ref 26.0–34.0)
MCH: 25.7 pg — ABNORMAL LOW (ref 26.0–34.0)
MCHC: 32.2 g/dL (ref 30.0–36.0)
MCHC: 31.8 g/dL (ref 30.0–36.0)
MCV: 80.8 fL (ref 78.0–100.0)
MCV: 80.9 fL (ref 78.0–100.0)
PLATELETS: 220 10*3/uL (ref 150–400)
PLATELETS: 222 10*3/uL (ref 150–400)
RBC: 3.85 MIL/uL — ABNORMAL LOW (ref 4.22–5.81)
RBC: 3.97 MIL/uL — ABNORMAL LOW (ref 4.22–5.81)
RDW: 16.1 % — ABNORMAL HIGH (ref 11.5–15.5)
RDW: 16.2 % — AB (ref 11.5–15.5)
WBC: 5.8 10*3/uL (ref 4.0–10.5)
WBC: 5.3 10*3/uL (ref 4.0–10.5)

## 2017-01-19 LAB — URINALYSIS, ROUTINE W REFLEX MICROSCOPIC
BILIRUBIN URINE: NEGATIVE
Glucose, UA: NEGATIVE mg/dL
Hgb urine dipstick: NEGATIVE
Ketones, ur: 5 mg/dL — AB
LEUKOCYTES UA: NEGATIVE
NITRITE: NEGATIVE
PROTEIN: NEGATIVE mg/dL
SPECIFIC GRAVITY, URINE: 1.026 (ref 1.005–1.030)
pH: 5 (ref 5.0–8.0)

## 2017-01-19 LAB — BASIC METABOLIC PANEL
Anion gap: 9 (ref 5–15)
BUN: 32 mg/dL — ABNORMAL HIGH (ref 6–20)
CALCIUM: 8.2 mg/dL — AB (ref 8.9–10.3)
CO2: 26 mmol/L (ref 22–32)
CREATININE: 1.71 mg/dL — AB (ref 0.61–1.24)
Chloride: 105 mmol/L (ref 101–111)
GFR, EST AFRICAN AMERICAN: 42 mL/min — AB (ref >60)
GFR, EST NON AFRICAN AMERICAN: 36 mL/min — AB (ref >60)
Glucose, Bld: 106 mg/dL — ABNORMAL HIGH (ref 65–99)
Potassium: 5.1 mmol/L (ref 3.5–5.1)
Sodium: 140 mmol/L (ref 135–145)

## 2017-01-19 LAB — TYPE AND SCREEN
ABO/RH(D): A POS
Antibody Screen: NEGATIVE

## 2017-01-19 LAB — GLUCOSE, CAPILLARY: Glucose-Capillary: 93 mg/dL (ref 65–99)

## 2017-01-19 MED ORDER — SODIUM CHLORIDE 0.9 % IV SOLN
INTRAVENOUS | Status: DC
  Administered 2017-01-19 – 2017-01-20 (×2): via INTRAVENOUS

## 2017-01-19 MED ORDER — SODIUM CHLORIDE 0.9 % IV SOLN
8.0000 mg/h | INTRAVENOUS | Status: DC
  Administered 2017-01-19 – 2017-01-21 (×6): 8 mg/h via INTRAVENOUS
  Filled 2017-01-19 (×12): qty 80

## 2017-01-19 MED ORDER — PANTOPRAZOLE SODIUM 40 MG IV SOLR: 40.0000 mg | Freq: Two times a day (BID) | INTRAVENOUS | Status: DC

## 2017-01-19 NOTE — Progress Notes (Signed)
RN withdrew Zofran from the Pyxis to give to patient but the patient refused the Zofran. The Zofran was wasted in the [place]. Witnessed by Robbie Lis.

## 2017-01-19 NOTE — Progress Notes (Signed)
PROGRESS NOTE    [name]  JGO:[id number] DOB: [birth year] DOA: 01/18/2017 PCP: Janith Lima, MD    Brief Narrative: [name] is a 82 y.o. male with history of gastric adenocarcinoma and lung mass had undergone chemotherapy and radiation therapy with history of prostate cancer in remission who has had paracentesis last week for ascites and since then patient has been having persistent vomiting. Patient vomitus has been coffee-ground. Denies any diarrhea. Has been having some abdominal discomfort which has been diffuse.   ED Course: In the ER patient's hemoglobin appears to be at baseline. Creatinine has worsened from baseline. Patient is hemodynamically stable. Was started on IV Protonix. Patient is being admitted for further management of acute GI bleed. [place] gastroenterologist Dr. Hilarie Fredrickson was notified by the ER physician.   Assessment & Plan:   Principal Problem:   Acute GI bleeding Active Problems:   Essential hypertension   COPD mixed type ([place])   Non-ischemic cardiomyopathy ([place])   Primary cancer of stomach with metastasis to other site [facility])   Acute kidney injury ([place])  1-Coffee ground Emesis; in setting of gastric cancer;  Continue with IV Protonix.  NPO, await GI evaluation.  Dr [name] notify of admission.  IV fluids 20 cc plus 25 cc with protonix  Gtt. Monitor for HF exacerbation.  KUB. No obstructive bowel pattern, thickening small bowel may indicate enteritis.   2-AKI; due to vomiting.  Hold ARB.  IV fluids.   3-Acute blood loss anemia; hb stable.   4-COPD; continue with theophylline.   5-Non ischemic Cardiomyopathy, S/P pacemaker.  Continue with coreg. Hold ARB.  EF 30 %.   6-HTN; hydralazine PRN.   7-Adenocarcinoma of the stomach and lung mass and history of prostate cancer being followed by oncologist. ascites secondary to metastasis.     DVT prophylaxis: SCD>  Code Status: full code.  Family Communication: care discussed  with 2 sons at bedside.  Disposition Plan: to be determine.    Consultants:   GI  Oncology    Procedures:   none   Antimicrobials:  none   Subjective: He has been vomiting since Friday. He has not been able to eat solid food.    Objective: Vitals:   01/18/17 1826 01/18/17 2255 01/19/17 1441  BP: 128/80 137/72 134/66  Pulse: 94 77 72  Resp: 15 16 19   Temp: (!) 97.5 F (36.4 C) 98.3 F (36.8 C) 98.5 F (36.9 C)  TempSrc: Oral Oral Oral  SpO2: 92% 97% 93%  Weight:  92 kg (202 lb 13.2 oz)   Height:  6\' 2"  (1.88 m)     Intake/Output Summary (Last 24 hours) at 01/19/17 1526 Last data filed at 01/19/17 1500  Gross per 24 hour  Intake           290.42 ml  Output              180 ml  Net           110.42 ml   Filed Weights   01/18/17 2255  Weight: 92 kg (202 lb 13.2 oz)    Examination:  General exam: Appears calm and comfortable  Respiratory system: Clear to auscultation. Respiratory effort normal. Cardiovascular system: S1 & S2 heard, RRR. No JVD, murmurs, rubs, gallops or clicks. No pedal edema. Gastrointestinal system: Abdomen is distended, soft and nontender. No organomegaly or masses felt. Normal bowel sounds heard. Central nervous system: Alert and oriented. No focal neurological deficits. Extremities: Symmetric 5 x 5  power. Skin: No rashes, lesions or ulcers     Data Reviewed: I have personally reviewed following labs and imaging studies  CBC:  Recent Labs Lab 01/18/17 2000 01/18/17 2328 01/19/17 0340 01/19/17 0821  WBC 6.8 6.5 5.8 5.3  HGB 11.8* 10.8* 10.0* 10.2*  HCT 36.5* 33.4* 31.1* 32.1*  MCV 82.0 82.9 80.8 80.9  PLT 258 247 220 149   Basic Metabolic Panel:  Recent Labs Lab 01/18/17 2000 01/19/17 0340  NA 138 140  K 5.1 5.1  CL 99* 105  CO2 28 26  GLUCOSE 127* 106*  BUN 29* 32*  CREATININE 1.76* 1.71*  CALCIUM 8.9 8.2*   GFR: Estimated Creatinine Clearance: 40.7 mL/min (A) (by C-G formula based on SCr of 1.71 mg/dL  (H)). Liver Function Tests:  Recent Labs Lab 01/18/17 2000  AST 38  ALT 19  ALKPHOS 121  BILITOT 0.8  PROT 7.4  ALBUMIN 2.9*    Recent Labs Lab 01/18/17 2000  LIPASE 37   No results for input(s): AMMONIA in the last 168 hours. Coagulation Profile:  Recent Labs Lab 01/18/17 2000  INR 1.03   Cardiac Enzymes: No results for input(s): CKTOTAL, CKMB, CKMBINDEX, TROPONINI in the last 168 hours. BNP (last 3 results) No results for input(s): PROBNP in the last 8760 hours. HbA1C: No results for input(s): HGBA1C in the last 72 hours. CBG: No results for input(s): GLUCAP in the last 168 hours. Lipid Profile: No results for input(s): CHOL, HDL, LDLCALC, TRIG, CHOLHDL, LDLDIRECT in the last 72 hours. Thyroid Function Tests: No results for input(s): TSH, T4TOTAL, FREET4, T3FREE, THYROIDAB in the last 72 hours. Anemia Panel: No results for input(s): VITAMINB12, FOLATE, FERRITIN, TIBC, IRON, RETICCTPCT in the last 72 hours. Sepsis Labs: No results for input(s): PROCALCITON, LATICACIDVEN in the last 168 hours.  No results found for this or any previous visit (from the past 240 hour(s)).       Radiology Studies: Dg Abd 1 View  Result Date: 01/19/2017 CLINICAL DATA:  Nausea and vomiting EXAM: ABDOMEN - 1 VIEW COMPARISON:  CT abdomen and pelvis 10/20/2016 FINDINGS: No large or small bowel distention. There is evidence of fold thickening and mid abdominal and left upper quadrant small bowel loops. This may indicate changes of enteritis. No radiopaque stones demonstrated. Vascular calcifications. Degenerative changes in the spine and hips. IMPRESSION: Nonobstructive bowel gas pattern. Fold thickening of small bowel loops may indicate enteritis. Electronically Signed   By: Lucienne Capers M.D.   On: 01/19/2017 01:31        Scheduled Meds: . carvedilol  6.25 mg Oral BID WC  . mometasone-formoterol  2 puff Inhalation BID  . [START ON 01/22/2017] pantoprazole  40 mg Intravenous  Q12H  . theophylline  400 mg Oral Daily   Continuous Infusions: . pantoprozole (PROTONIX) infusion 8 mg/hr (01/19/17 0223)     LOS: 1 day    Time spent: 35 minutes.     Elmarie Shiley, MD [facility] Pager [phone]  If 7PM-7AM, please contact night-coverage [email or web address] Password [facility] 01/19/2017, 3:26 PM

## 2017-01-19 NOTE — Care Management Note (Signed)
Case Management Note  Patient Details  Name: [name] MRN: [id number] Date of Birth: 1935-01-08  Subjective/Objective:  82 y/o m admitted w/GIB. From home. Hx: Gastric Ca.Gi/onc following.                  Action/Plan:d/c plan home.   Expected Discharge Date:                  Expected Discharge Plan:  Home/Self Care  In-House Referral:     Discharge planning Services  CM Consult  Post Acute Care Choice:    Choice offered to:     DME Arranged:    DME Agency:     HH Arranged:    HH Agency:     Status of Service:  In process, will continue to follow  If discussed at Long Length of Stay Meetings, dates discussed:    Additional Comments:  Dessa Phi, RN 01/19/2017, 1:05 PM

## 2017-01-19 NOTE — Care Management CC44 (Signed)
Condition Code 44 Documentation Completed  Patient Details  Name: [name] MRN: [id number] Date of Birth: 11-Jan-1935   Condition Code 44 given:  Yes Patient signature on Condition Code 44 notice:  Yes Documentation of 2 MD's agreement:  Yes Code 44 added to claim:  Yes    Guadalupe Maple, RN 01/19/2017, 4:10 PM

## 2017-01-19 NOTE — Consult Note (Signed)
Referring Provider: [facility]  Primary Care Physician:  Janith Lima, MD Primary Gastroenterologist:  Jolly Mango,   MD  Reason for Consultation:  GI bleed  ASSESSMENT AND PLAN:   64. 82 yo male with metastatic gastric adenocarcinoma with malignant ascites requiring paracentesis. Followed by [name]. At last visit with Dr. [name] in late August discussion held regarding goals. Patient was to think about comfort care vrs Hospice vrs salvage chemo.   2. Progressive nausea / vomiting / weight loss. Last EGD in April revealed large gastric mass with resulting partial gastric outlet obstruction. 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 Plain films this admission negative for obstruction but probably self-decompressed after several episodes of vomiting.   3. Coffee ground emesis. Not surprising in setting of gastric mass and repeated episodes of vomiting.  -doubt EGD would be of therapeutic benefit.  -continue BID PPI. -Hopefully he can tolerate sips at some point.  -Have goals of care been clarified. I know patient hasn't yet been back to see Dr. [name] for further discussion of things.  HPI: [name] is a 82 y.o. male with multiple medical problems not limited to nonischemic cardiomyopathy s/p ICD, COPD and diabetes. In Feb 2017 he was diagnosed with metastatic gastric cancer, he is followed by Dr. [name] and at last visit in late August there was discussion regarding goals of care. Patient was given option of comfort care, trial of salvage chemo or Hospice. He was to return to Dr. [name] in two weeks. [name] was admitted last night with vomiting / coffee ground emesis. His hgb has declined from baseline of 11.8 to 10.2 but stable overnight. He is getting BID IV PPI  Patient has been having intermittent vomiting but it became acute worse two days ago. He has been downgrading diet himself but got to point where even liquids wouldn't stay down. He reports a 25 pound  weight loss. Two days ago emesis turned dark brown. Last episode of coffee ground emesis was yesterday. Stools normal color. No nausea today but he is NPO. No abdominal pain, just "sore". He tried anti-emetics at home but they didn't help. Most recent EGD in April of this year showed the large circumferential mass causing partial SBO.   Past Medical History:  Diagnosis Date  . AICD (automatic cardioverter/defibrillator) present   . Anemia   . Arthritis       . Asthma   . Chronic systolic heart failure ([place])   . Colon polyps   . COPD (chronic obstructive pulmonary disease) ([facility])    FEV1 2.68/72%, FEV1/FVC 0.63 (08/22/04  . History of blood transfusion   . HTN (hypertension)   . Hyperlipidemia   . LBBB (left bundle branch block)    [name] 06/20/2014  . Malignant neoplasm pyloric antrum ([place])   . Non-ischemic cardiomyopathy ([place])    a. s/p STJ CRTD 07/2014  . Pneumonia   . Primary cancer of stomach with metastasis to other site ([place]) 07/12/2015  . Prostate cancer ([place]) 2010  . Shortness of breath dyspnea     Past Surgical History:  Procedure Laterality Date  . BI-VENTRICULAR IMPLANTABLE CARDIOVERTER DEFIBRILLATOR N/A 07/17/2014   STJ CRTD implanted by Dr Lovena Le  . CATARACT EXTRACTION W/ INTRAOCULAR LENS  IMPLANT, BILATERAL Bilateral 2010  . COLONOSCOPY    . ESOPHAGOGASTRODUODENOSCOPY (EGD) WITH PROPOFOL N/A 06/27/2015   Procedure: ESOPHAGOGASTRODUODENOSCOPY (EGD) WITH PROPOFOL;  Surgeon: Manus Gunning, MD;  Location: [facility];  Service: Gastroenterology;  Laterality: N/A;  . ESOPHAGOGASTRODUODENOSCOPY (EGD)  WITH PROPOFOL N/A 08/13/2016   Procedure: ESOPHAGOGASTRODUODENOSCOPY (EGD) WITH PROPOFOL;  Surgeon: Manus Gunning, MD;  Location: [facility];  Service: Gastroenterology;  Laterality: N/A;  . IR PARACENTESIS  01/12/2017  . LEFT HEART CATHETERIZATION WITH CORONARY ANGIOGRAM N/A 01/04/2014   no obstructive CAD  . PORTACATH PLACEMENT N/A 07/11/2015   Procedure:  INSERTION PORT-A-CATH WITH ULTRA SOUND GUIDANCE;  Surgeon: Stark Klein, MD;  Location: [facility];  Service: General;  Laterality: N/A;  . PROSTATECTOMY    . ROBOT ASSISTED LAPAROSCOPIC RADICAL PROSTATECTOMY  2010    Prior to Admission medications   Medication Sig Start Date End Date Taking? Authorizing Provider  acetaminophen (TYLENOL) 500 MG tablet Take 1,000 mg by mouth daily as needed for fever or headache.   Yes [provider]  amLODipine (NORVASC) 10 MG tablet TAKE 1 TABLET(10 MG) BY MOUTH DAILY 10/16/16  Yes Janith Lima, MD  atorvastatin (LIPITOR) 40 MG tablet TAKE 1 TABLET BY MOUTH DAILY 09/04/16  Yes Fay Records, MD  budesonide-formoterol [facility]) 160-4.5 MCG/ACT inhaler Inhale 2 puffs then rinse mouth, twice daily 02/11/16  Yes Young, Clinton D, MD  carvedilol (COREG) 6.25 MG tablet TAKE 1 TABLET(6.25 MG) BY MOUTH TWICE DAILY WITH A MEAL 11/16/16  Yes Janith Lima, MD  Fe Cbn-Fe Gluc-FA-B12-C-DSS (FERRALET 90) 90-1 MG TABS Take 1 tablet by mouth daily. 07/09/15  Yes [provider]  irbesartan (AVAPRO) 300 MG tablet Take 1 tablet (300 mg total) by mouth daily. 12/04/16  Yes Janith Lima, MD  lidocaine-prilocaine (EMLA) cream Apply 1 application topically as needed. Apply to [facility] 1 hour prior to stick and cover w/plastic wrap 07/11/15  Yes Ladell Pier, MD  omeprazole (PRILOSEC) 40 MG capsule Take 1 capsule (40 mg total) by mouth daily. 02/18/16  Yes Janith Lima, MD  Polyethyl Glycol-Propyl Glycol (SYSTANE OP) Place 1 drop into both eyes daily.    Yes [provider]  potassium chloride SA (K-DUR,KLOR-CON) 20 MEQ tablet Take 1 tablet (20 mEq total) by mouth daily. 06/10/16  Yes Owens Shark, NP  theophylline (UNIPHYL) 400 MG 24 hr tablet Take 400 mg by mouth daily.   Yes [provider]  HYDROcodone-homatropine (HYCODAN) 5-1.5 MG/5ML syrup Take 5 mLs by mouth every 6 (six) hours as needed for cough. Patient not taking: Reported on 01/18/2017  08/12/16   Deneise Lever, MD  prochlorperazine (COMPAZINE) 10 MG tablet Take 1 tablet (10 mg total) by mouth every 6 (six) hours as needed for nausea or vomiting. Patient not taking: Reported on 01/18/2017 12/29/16   Ladell Pier, MD    Current Facility-Administered Medications  Medication Dose Route Frequency Provider Last Rate Last Dose  . acetaminophen (TYLENOL) tablet 650 mg  650 mg Oral Q6H PRN Rise Patience, MD       Or  . acetaminophen (TYLENOL) suppository 650 mg  650 mg Rectal Q6H PRN Rise Patience, MD      . carvedilol (COREG) tablet 6.25 mg  6.25 mg Oral BID WC Rise Patience, MD   6.25 mg at 01/19/17 0851  . hydrALAZINE (APRESOLINE) injection 10 mg  10 mg Intravenous Q4H PRN Rise Patience, MD      . mometasone-formoterol [facility]) 200-5 MCG/ACT inhaler 2 puff  2 puff Inhalation BID Rise Patience, MD      . ondansetron [facility]) tablet 4 mg  4 mg Oral Q6H PRN Rise Patience, MD       Or  .  ondansetron (ZOFRAN) injection 4 mg  4 mg Intravenous Q6H PRN Rise Patience, MD      . pantoprazole (PROTONIX) 80 mg in sodium chloride 0.9 % 250 mL (0.32 mg/mL) infusion  8 mg/hr Intravenous Continuous Rise Patience, MD 25 mL/hr at 01/19/17 0223 8 mg/hr at 01/19/17 0223  . [START ON 01/22/2017] pantoprazole (PROTONIX) injection 40 mg  40 mg Intravenous Q12H Rise Patience, MD      . theophylline (UNIPHYL) 400 MG 24 hr tablet 400 mg  400 mg Oral Daily Rise Patience, MD        Allergies as of 01/18/2017 - Review Complete 01/18/2017  Allergen Reaction Noted  . Adhesive [tape]  08/29/2016  . Other Rash 08/29/2016    Family History  Problem Relation Age of Onset  . Arthritis Other   . Esophagitis Mother        STRICTURE  . Stroke Mother   . Hypertension Mother   . Prostate cancer Father   . Hypertension Father   . Heart attack Neg Hx     Social History   Social History  . Marital status: Married    Spouse name:  Peter Congo  . Number of children: 2  . Years of education: N/A   Occupational History  . Retired    Social History Main Topics  . Smoking status: Former Smoker    Packs/day: 1.50    Years: 40.00    Types: Cigarettes    Quit date: 05/05/2002  . Smokeless tobacco: Never Used  . Alcohol use No  . Drug use: No  . Sexual activity: Not Currently   Other Topics Concern  . Not on file   Social History Narrative   Regular Exercise -  NO   Married, wife Peter Congo   #2 sons-[name] and Juanda Crumble       Review of Systems: Positive for SOB (chronic) All other systems reviewed and negative except where noted in HPI.  Physical Exam: Vital signs in last 24 hours: Temp:  [97.5 F (36.4 C)-98.3 F (36.8 C)] 98.3 F (36.8 C) (09/16 2255) Pulse Rate:  [77-94] 77 (09/16 2255) Resp:  [15-16] 16 (09/16 2255) BP: (128-137)/(72-80) 137/72 (09/16 2255) SpO2:  [92 %-97 %] 97 % (09/16 2255) Weight:  [202 lb 13.2 oz (92 kg)] 202 lb 13.2 oz (92 kg) (09/16 2255) Last BM Date: 01/18/17 General:   Alert, well-developed,  Black male in NAD. Son in room Psych:  Pleasant, cooperative. Normal mood and affect. Eyes:  Pupils equal, sclera clear, no icterus.   Conjunctiva pink. Ears:  Normal auditory acuity. Nose:  No deformity, discharge,  or lesions. Neck:  Supple; no masses Lungs:  A few RLL crackles, o/w clear throughout to auscultation.    Heart:  Regular rate and rhythm; no edema Abdomen:  Soft, non-distended, nontender, BS active  Rectal:  Deferred  Msk:  Symmetrical without gross deformities. . Pulses:  Normal pulses noted. Neurologic:  Alert and  oriented x4;  grossly normal neurologically. Skin:  Intact without significant lesions or rashes..   Intake/Output from previous day: 09/16 0701 - 09/17 0700 In: 90.4 [I.V.:90.4] Out: 80 [Urine:80] Intake/Output this shift: No intake/output data recorded.  Lab Results:  Recent Labs  01/18/17 2328 01/19/17 0340 01/19/17 0821  WBC 6.5 5.8  5.3  HGB 10.8* 10.0* 10.2*  HCT 33.4* 31.1* 32.1*  PLT 247 220 222   BMET  Recent Labs  01/18/17 2000 01/19/17 0340  NA 138 140  K 5.1 5.1  CL 99* 105  CO2 28 26  GLUCOSE 127* 106*  BUN 29* 32*  CREATININE 1.76* 1.71*  CALCIUM 8.9 8.2*   LFT  Recent Labs  01/18/17 2000  PROT 7.4  ALBUMIN 2.9*  AST 38  ALT 19  ALKPHOS 121  BILITOT 0.8   PT/INR  Recent Labs  01/18/17 2000  LABPROT 13.4  INR 1.03   Hepatitis Panel No results for input(s): HEPBSAG, HCVAB, HEPAIGM, HEPBIGM in the last 72 hours.   Studies/Results: Dg Abd 1 View  Result Date: 01/19/2017 CLINICAL DATA:  Nausea and vomiting EXAM: ABDOMEN - 1 VIEW COMPARISON:  CT abdomen and pelvis 10/20/2016 FINDINGS: No large or small bowel distention. There is evidence of fold thickening and mid abdominal and left upper quadrant small bowel loops. This may indicate changes of enteritis. No radiopaque stones demonstrated. Vascular calcifications. Degenerative changes in the spine and hips. IMPRESSION: Nonobstructive bowel gas pattern. Fold thickening of small bowel loops may indicate enteritis. Electronically Signed   By: Lucienne Capers M.D.   On: 01/19/2017 01:31    Tye Savoy, NP-C @  01/19/2017, 9:38 AM Pager number [phone]  GI ATTENDING  History, laboratories, x-rays, prior endoscopy reports acute. Patient personally seen and examined. Multiple family members in room. Agree with comprehensive consultation note as outlined above. Patient has advanced gastric cancer with metastases to the abdomen with malignant ascites. Has undergone chemotherapy previously as outlined. Presents now with recurrent vomiting likely secondary to gastric outlet obstruction from tumor. My plan is for upper endoscopy with possible placement of self-expanding metal stent to relieve obstruction. I discussed the nature procedure as well as the risks (including perforation with death), benefits (ability to eat without vomiting), and  alternatives (such as surgery, gastrostomy, NG tube, or nothing). Instructional diagram provided personally. They understood and had all questions answered to their satisfaction. Tentative plans for endoscopy tomorrow.  Docia Chuck. Geri Seminole., M.D. [facility] Division of Gastroenterology.

## 2017-01-19 NOTE — Progress Notes (Signed)
IP PROGRESS NOTE  Subjective:   [name] was admitted yesterday with coffee ground emesis. He reports nausea the day after eating for the past few weeks. The nausea/vomiting happened hours after eating with both liquids and solids for the past few days.  He denies pain. He underwent a palliative paracentesis for 3.3 L on 01/12/2017.  Objective: Vital signs in last 24 hours: Blood pressure 134/66, pulse 72, temperature 98.5 F (36.9 C), temperature source Oral, resp. rate 19, height 6\' 2"  (1.88 m), weight 202 lb 13.2 oz (92 kg), SpO2 93 %.  Intake/Output from previous day: 09/16 0701 - 09/17 0700 In: 90.4 [I.V.:90.4] Out: 80 [Urine:80]  Physical Exam:  Lungs: Clear bilaterally, no respiratory distress Cardiac: Regular rate and rhythm Abdomen: Mildly distended, nontender, no mass Extremities: No leg edema   Portacath/PICC-without erythema  Lab Results:  Recent Labs  01/19/17 0340 01/19/17 0821  WBC 5.8 5.3  HGB 10.0* 10.2*  HCT 31.1* 32.1*  PLT 220 222    BMET  Recent Labs  01/18/17 2000 01/19/17 0340  NA 138 140  K 5.1 5.1  CL 99* 105  CO2 28 26  GLUCOSE 127* 106*  BUN 29* 32*  CREATININE 1.76* 1.71*  CALCIUM 8.9 8.2*    Lab Results  Component Value Date   CEA1 3.4 07/19/2015    Studies/Results: Dg Abd 1 View  Result Date: 01/19/2017 CLINICAL DATA:  Nausea and vomiting EXAM: ABDOMEN - 1 VIEW COMPARISON:  CT abdomen and pelvis 10/20/2016 FINDINGS: No large or small bowel distention. There is evidence of fold thickening and mid abdominal and left upper quadrant small bowel loops. This may indicate changes of enteritis. No radiopaque stones demonstrated. Vascular calcifications. Degenerative changes in the spine and hips. IMPRESSION: Nonobstructive bowel gas pattern. Fold thickening of small bowel loops may indicate enteritis. Electronically Signed   By: Lucienne Capers M.D.   On: 01/19/2017 01:31    Medications: I have reviewed the patient's  current medications.  Assessment/Plan:  1. Gastric Cancer  Lesser curvature gastric body mass noted on endoscopy 06/27/2015, biopsy confirmed adenocarcinoma  Staging CT scans to 06/28/2015 with a gastric body mass, adenopathy adjacent to the stomach, chest adenopathy, a single enhancing liver lesion, and indeterminate nodular pulmonary lesions  PET scan 07/12/2015-hypermetabolic gastric mass, gastrohepatic ligament metastases, metastasis at the left adrenal gland, hypermetabolic right paratracheal, right hilar, and subcarinal nodes moderate metabolic activity involving 2 pulmonary lesions  Cycle 1 FOLFOX 07/19/2015  Cycle 2 FOLFOX 08/02/2015  Cycle 3 FOLFOX 08/23/2015 with Neulasta support  Cycle 4 FOLFOX 09/06/2015 with Neulasta support  Cycle 5 FOLFOX 09/20/2015 with Neulasta support  Restaging CTs 10/02/2015-decrease in chest and perigastric lymphadenopathy, decrease thickening at the distal stomach, 11 mm sclerotic lesion in the posterior left third rib-barely visible on the PET/CT 07/12/2015  Cycle 6 FOLFOX 10/04/2015  Cycle 7 FOLFOX 10/18/2015  Cycle 8 FOLFOX 11/08/2015 (oxaliplatin held due to neuropathy)  Cycle 9 FOLFOX 11/22/2015 (oxaliplatin held secondary to neuropathy)  Cycle 10 FOLFOX 12/06/2015 (oxaliplatin held secondary to neuropathy)  CTs 12/24/2015-stable lung nodules and mediastinal lymph nodes, no evidence of disease progression  Upper endoscopy 08/13/2016 showed a large ulcerated circumferential mass with no bleeding in the gastric antrum and at the pylorus causing partial outlet obstruction.  Initiation of infusional 5-FU and radiation 08/21/2016;completed 09/10/2016  CTs06/18/2018-enlargement of a pleural-based right upper lobe nodule, other lung nodules are unchanged, no evidence of metastatic disease in the abdomen/pelvis  CT chest 12/12/2016-mild decrease in size of dominantpulmonary nodule  medial right upper lobe. Other bilateral subcentimeter  pulmonary nodulesshowed no significant change. New abdominal ascites.  Paracentesis 12/26/2016-cytology positive for adenocarcinoma  2. COPD  3. Nonischemic cardiomyopathy  4. Implanted cardiac defibrillator  5. History of prostate cancer  6. Hypertension  7. History of Iron deficiency anemia-hemoglobin lower03/20/2018  8. Neutropenia secondary to chemotherapy-Neulasta added with cycle 3 FOLFOX 08/23/2015  9. 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 Anemia secondary to blood loss related to #1. Blood transfusion 08/12/2016,08/16/2016.Hemoglobin improved 09/04/2016.Hemoglobin stable 09/18/2016.  12. 08/28/2016-hyperpigmentation with skin induration surrounding the Port-A-Cath, most likely a tape or skin cleanser reaction; 09/18/2016 the skin has healed; marked hyperpigmentation persists.  13.  Admission 01/18/2017 with nausea/vomiting and coffee-ground emesis-likely gastric outlet obstruction related to tumor   [name]  has metastatic gastric cancer. He is admitted with refractory nausea/vomiting and coffee-ground emesis. He most likely has gastric outlet obstruction secondary to local progression of the gastric tumor.  He saw Dr. Henrene Pastor and will undergo an upper endoscopy and gastric stent placement as indicated on 01/20/2017. He previously received gastric radiation for palliation of bleeding and partial gastric outlet obstruction.  [name] cares for his wife who has dementia. His sons requested I did not discuss prognosis and Hospice in her presence this afternoon. I discussed the poor prognosis with his sons outside of the hospital room today. I recommend Hospice care. They are in agreement. I will discuss this recommendation with [name] in the a.m. on 01/20/2017.  Recommendations: 1.  Endoscopy/stent placement per Dr. Henrene Pastor 2. [facility] hospice referral for home care-I will place order after  discussing with Mr. Soltau 3. Established CODE STATUS-I will discuss with [name] in the a.m. on 01/20/2017 4. Palliative therapeutic paracentesis as needed  I will continue following [name] in the hospital and arrange for outpatient follow-up.     LOS: 1 day   Donneta Romberg, MD   01/19/2017, 4:38 PM

## 2017-01-19 NOTE — Progress Notes (Signed)
RN sent urinalysis to the lab.

## 2017-01-19 NOTE — Care Management Obs Status (Signed)
[facility] NOTIFICATION   Patient Details  Name: MARCIAL PLESS MRN: [id number] Date of Birth: Oct 14, 1934   Medicare Observation Status Notification Given:  Yes    Guadalupe Maple, RN 01/19/2017, 4:10 PM

## 2017-01-20 ENCOUNTER — Encounter (HOSPITAL_COMMUNITY)
Admission: EM | Disposition: A | Discharge status: Hospice | Payer: Self-pay | Source: Home / Self Care | Attending: Internal Medicine

## 2017-01-20 ENCOUNTER — Observation Stay (HOSPITAL_COMMUNITY): Payer: Medicare HMO

## 2017-01-20 ENCOUNTER — Observation Stay (HOSPITAL_COMMUNITY): Payer: Medicare HMO | Admitting: Certified Registered Nurse Anesthetist

## 2017-01-20 ENCOUNTER — Encounter (HOSPITAL_COMMUNITY): Payer: Self-pay | Admitting: *Deleted

## 2017-01-20 DIAGNOSIS — C162 Malignant neoplasm of body of stomach: Secondary | ICD-10-CM | POA: Diagnosis not present

## 2017-01-20 DIAGNOSIS — Z66 Do not resuscitate: Secondary | ICD-10-CM | POA: Diagnosis not present

## 2017-01-20 DIAGNOSIS — K922 Gastrointestinal hemorrhage, unspecified: Secondary | ICD-10-CM | POA: Diagnosis present

## 2017-01-20 DIAGNOSIS — Z95810 Presence of automatic (implantable) cardiac defibrillator: Secondary | ICD-10-CM | POA: Diagnosis not present

## 2017-01-20 DIAGNOSIS — R180 Malignant ascites: Secondary | ICD-10-CM | POA: Diagnosis present

## 2017-01-20 DIAGNOSIS — J449 Chronic obstructive pulmonary disease, unspecified: Secondary | ICD-10-CM | POA: Diagnosis present

## 2017-01-20 DIAGNOSIS — Z8546 Personal history of malignant neoplasm of prostate: Secondary | ICD-10-CM | POA: Diagnosis not present

## 2017-01-20 DIAGNOSIS — K311 Adult hypertrophic pyloric stenosis: Secondary | ICD-10-CM | POA: Diagnosis not present

## 2017-01-20 DIAGNOSIS — Z91048 Other nonmedicinal substance allergy status: Secondary | ICD-10-CM | POA: Diagnosis not present

## 2017-01-20 DIAGNOSIS — Z9841 Cataract extraction status, right eye: Secondary | ICD-10-CM | POA: Diagnosis not present

## 2017-01-20 DIAGNOSIS — C169 Malignant neoplasm of stomach, unspecified: Secondary | ICD-10-CM | POA: Diagnosis present

## 2017-01-20 DIAGNOSIS — Z888 Allergy status to other drugs, medicaments and biological substances status: Secondary | ICD-10-CM | POA: Diagnosis not present

## 2017-01-20 DIAGNOSIS — I110 Hypertensive heart disease with heart failure: Secondary | ICD-10-CM | POA: Diagnosis present

## 2017-01-20 DIAGNOSIS — R0902 Hypoxemia: Secondary | ICD-10-CM | POA: Diagnosis not present

## 2017-01-20 DIAGNOSIS — C7972 Secondary malignant neoplasm of left adrenal gland: Secondary | ICD-10-CM | POA: Diagnosis not present

## 2017-01-20 DIAGNOSIS — K209 Esophagitis, unspecified: Secondary | ICD-10-CM

## 2017-01-20 DIAGNOSIS — I5022 Chronic systolic (congestive) heart failure: Secondary | ICD-10-CM | POA: Diagnosis present

## 2017-01-20 DIAGNOSIS — K920 Hematemesis: Secondary | ICD-10-CM | POA: Diagnosis present

## 2017-01-20 DIAGNOSIS — Z87891 Personal history of nicotine dependence: Secondary | ICD-10-CM | POA: Diagnosis not present

## 2017-01-20 DIAGNOSIS — C164 Malignant neoplasm of pylorus: Secondary | ICD-10-CM | POA: Diagnosis present

## 2017-01-20 DIAGNOSIS — I447 Left bundle-branch block, unspecified: Secondary | ICD-10-CM | POA: Diagnosis present

## 2017-01-20 DIAGNOSIS — C163 Malignant neoplasm of pyloric antrum: Secondary | ICD-10-CM | POA: Diagnosis not present

## 2017-01-20 DIAGNOSIS — Z86010 Personal history of colonic polyps: Secondary | ICD-10-CM | POA: Diagnosis not present

## 2017-01-20 DIAGNOSIS — Z9842 Cataract extraction status, left eye: Secondary | ICD-10-CM | POA: Diagnosis not present

## 2017-01-20 DIAGNOSIS — J690 Pneumonitis due to inhalation of food and vomit: Secondary | ICD-10-CM | POA: Diagnosis present

## 2017-01-20 DIAGNOSIS — E785 Hyperlipidemia, unspecified: Secondary | ICD-10-CM | POA: Diagnosis present

## 2017-01-20 DIAGNOSIS — N179 Acute kidney failure, unspecified: Secondary | ICD-10-CM | POA: Diagnosis present

## 2017-01-20 DIAGNOSIS — R188 Other ascites: Secondary | ICD-10-CM | POA: Diagnosis not present

## 2017-01-20 DIAGNOSIS — K56609 Unspecified intestinal obstruction, unspecified as to partial versus complete obstruction: Secondary | ICD-10-CM | POA: Diagnosis not present

## 2017-01-20 DIAGNOSIS — I10 Essential (primary) hypertension: Secondary | ICD-10-CM | POA: Diagnosis not present

## 2017-01-20 DIAGNOSIS — Z961 Presence of intraocular lens: Secondary | ICD-10-CM | POA: Diagnosis present

## 2017-01-20 DIAGNOSIS — R112 Nausea with vomiting, unspecified: Secondary | ICD-10-CM | POA: Diagnosis not present

## 2017-01-20 DIAGNOSIS — D62 Acute posthemorrhagic anemia: Secondary | ICD-10-CM | POA: Diagnosis present

## 2017-01-20 DIAGNOSIS — D509 Iron deficiency anemia, unspecified: Secondary | ICD-10-CM | POA: Diagnosis not present

## 2017-01-20 DIAGNOSIS — I429 Cardiomyopathy, unspecified: Secondary | ICD-10-CM | POA: Diagnosis present

## 2017-01-20 HISTORY — PX: ESOPHAGOGASTRODUODENOSCOPY: SHX5428

## 2017-01-20 LAB — GLUCOSE, CAPILLARY
Glucose-Capillary: 79 mg/dL (ref 65–99)
Glucose-Capillary: 82 mg/dL (ref 65–99)
Glucose-Capillary: 103 mg/dL — ABNORMAL HIGH (ref 65–99)

## 2017-01-20 LAB — CBC
HEMATOCRIT: 32.3 % — AB (ref 39.0–52.0)
HEMOGLOBIN: 10.3 g/dL — AB (ref 13.0–17.0)
MCH: 26.2 pg (ref 26.0–34.0)
MCHC: 31.9 g/dL (ref 30.0–36.0)
MCV: 82.2 fL (ref 78.0–100.0)
PLATELETS: 230 10*3/uL (ref 150–400)
RBC: 3.93 MIL/uL — AB (ref 4.22–5.81)
RDW: 16.1 % — ABNORMAL HIGH (ref 11.5–15.5)
WBC: 5.3 10*3/uL (ref 4.0–10.5)

## 2017-01-20 LAB — BASIC METABOLIC PANEL
Anion gap: 11 (ref 5–15)
BUN: 40 mg/dL — AB (ref 6–20)
CHLORIDE: 105 mmol/L (ref 101–111)
CO2: 25 mmol/L (ref 22–32)
Calcium: 8.3 mg/dL — ABNORMAL LOW (ref 8.9–10.3)
Creatinine, Ser: 1.92 mg/dL — ABNORMAL HIGH (ref 0.61–1.24)
GFR calc Af Amer: 37 mL/min — ABNORMAL LOW (ref >60)
GFR, EST NON AFRICAN AMERICAN: 32 mL/min — AB (ref >60)
GLUCOSE: 85 mg/dL (ref 65–99)
POTASSIUM: 5 mmol/L (ref 3.5–5.1)
Sodium: 141 mmol/L (ref 135–145)

## 2017-01-20 LAB — TROPONIN I

## 2017-01-20 SURGERY — EGD (ESOPHAGOGASTRODUODENOSCOPY)
Anesthesia: Monitor Anesthesia Care

## 2017-01-20 MED ORDER — PROPOFOL 10 MG/ML IV BOLUS
INTRAVENOUS | Status: AC
  Filled 2017-01-20: qty 20

## 2017-01-20 MED ORDER — LACTATED RINGERS IV SOLN
INTRAVENOUS | Status: DC | PRN
  Administered 2017-01-20: 12:00:00 via INTRAVENOUS

## 2017-01-20 MED ORDER — NITROGLYCERIN 0.4 MG SL SUBL
0.4000 mg | SUBLINGUAL_TABLET | SUBLINGUAL | Status: DC | PRN
  Administered 2017-01-20: 0.4 mg via SUBLINGUAL

## 2017-01-20 MED ORDER — NITROGLYCERIN 0.4 MG SL SUBL
SUBLINGUAL_TABLET | SUBLINGUAL | Status: AC
  Filled 2017-01-20: qty 1

## 2017-01-20 MED ORDER — MORPHINE SULFATE (PF) 4 MG/ML IV SOLN
0.5000 mg | INTRAVENOUS | Status: DC | PRN
  Administered 2017-01-20: 0.5 mg via INTRAVENOUS
  Filled 2017-01-20: qty 1

## 2017-01-20 MED ORDER — PROPOFOL 500 MG/50ML IV EMUL
INTRAVENOUS | Status: DC | PRN
  Administered 2017-01-20: 150 ug/kg/min via INTRAVENOUS

## 2017-01-20 MED ORDER — SODIUM CHLORIDE 0.9 % IV SOLN
INTRAVENOUS | Status: DC | PRN
  Administered 2017-01-20: 30 mL

## 2017-01-20 MED ORDER — VANCOMYCIN HCL IN DEXTROSE 1-5 GM/200ML-% IV SOLN
1000.0000 mg | INTRAVENOUS | Status: DC
  Administered 2017-01-20 – 2017-01-21 (×2): 1000 mg via INTRAVENOUS
  Filled 2017-01-20 (×2): qty 200

## 2017-01-20 MED ORDER — MORPHINE SULFATE (PF) 4 MG/ML IV SOLN: 0.5000 mg | INTRAVENOUS | Status: DC | PRN

## 2017-01-20 MED ORDER — PIPERACILLIN-TAZOBACTAM 3.375 G IVPB
3.3750 g | Freq: Three times a day (TID) | INTRAVENOUS | Status: DC
  Administered 2017-01-20 – 2017-01-22 (×5): 3.375 g via INTRAVENOUS
  Filled 2017-01-20 (×5): qty 50

## 2017-01-20 MED ORDER — PROPOFOL 10 MG/ML IV BOLUS
INTRAVENOUS | Status: DC | PRN
  Administered 2017-01-20: 20 mg via INTRAVENOUS
  Administered 2017-01-20: 10 mg via INTRAVENOUS
  Administered 2017-01-20: 20 mg via INTRAVENOUS
  Administered 2017-01-20: 40 mg via INTRAVENOUS

## 2017-01-20 MED ORDER — EPHEDRINE SULFATE-NACL 50-0.9 MG/10ML-% IV SOSY
PREFILLED_SYRINGE | INTRAVENOUS | Status: DC | PRN
  Administered 2017-01-20: 5 mg via INTRAVENOUS
  Administered 2017-01-20: 10 mg via INTRAVENOUS
  Administered 2017-01-20 (×3): 5 mg via INTRAVENOUS
  Administered 2017-01-20: 10 mg via INTRAVENOUS

## 2017-01-20 MED ORDER — PHENYLEPHRINE 40 MCG/ML (10ML) SYRINGE FOR IV PUSH (FOR BLOOD PRESSURE SUPPORT)
PREFILLED_SYRINGE | INTRAVENOUS | Status: AC
  Filled 2017-01-20: qty 10

## 2017-01-20 MED ORDER — LIDOCAINE 2% (20 MG/ML) 5 ML SYRINGE
INTRAMUSCULAR | Status: DC | PRN
  Administered 2017-01-20 (×3): 50 mg via INTRAVENOUS

## 2017-01-20 MED ORDER — ALBUTEROL SULFATE (2.5 MG/3ML) 0.083% IN NEBU
2.5000 mg | INHALATION_SOLUTION | RESPIRATORY_TRACT | Status: DC | PRN
  Administered 2017-01-20: 2.5 mg via RESPIRATORY_TRACT

## 2017-01-20 MED ORDER — LIDOCAINE 2% (20 MG/ML) 5 ML SYRINGE
INTRAMUSCULAR | Status: AC
  Filled 2017-01-20: qty 5

## 2017-01-20 MED ORDER — PHENYLEPHRINE 40 MCG/ML (10ML) SYRINGE FOR IV PUSH (FOR BLOOD PRESSURE SUPPORT)
PREFILLED_SYRINGE | INTRAVENOUS | Status: DC | PRN
  Administered 2017-01-20 (×2): 80 ug via INTRAVENOUS

## 2017-01-20 MED ORDER — EPHEDRINE 5 MG/ML INJ
INTRAVENOUS | Status: AC
  Filled 2017-01-20: qty 10

## 2017-01-20 MED ORDER — ALBUTEROL SULFATE (2.5 MG/3ML) 0.083% IN NEBU
INHALATION_SOLUTION | RESPIRATORY_TRACT | Status: AC
  Administered 2017-01-20: 2.5 mg via RESPIRATORY_TRACT
  Filled 2017-01-20: qty 3

## 2017-01-20 NOTE — Anesthesia Postprocedure Evaluation (Signed)
Anesthesia Post Note  Patient: [name]  Procedure(s) Performed: Procedure(s) (LRB): EGD with stent placement for relief of gastric outlet obstruction related to gastric mass (N/A)     Patient location during evaluation: PACU Anesthesia Type: MAC Level of consciousness: awake and alert Pain management: pain level controlled Vital Signs Assessment: post-procedure vital signs reviewed and stable Respiratory status: spontaneous breathing, nonlabored ventilation and respiratory function stable Cardiovascular status: stable and blood pressure returned to baseline Postop Assessment: no apparent nausea or vomiting Anesthetic complications: no    Last Vitals:  Vitals:   01/20/17 1224 01/20/17 1347  BP: 132/68 118/60  Pulse:  70  Resp: 20 (!) 27  Temp: 36.7 C   SpO2: 94% 94%    Last Pain:  Vitals:   01/20/17 1224  TempSrc: Oral  PainSc:                  Lynda Rainwater

## 2017-01-20 NOTE — Progress Notes (Signed)
IP PROGRESS NOTE  Subjective:   Mr. Ptacek appears unchanged. He is scheduled for endoscopy today. His son is at the bedside.  Objective: Vital signs in last 24 hours: Blood pressure 140/63, pulse 76, temperature 99.1 F (37.3 C), temperature source Oral, resp. rate 20, height 6\' 2"  (1.88 m), weight 202 lb 13.2 oz (92 kg), SpO2 94 %.  Intake/Output from previous day: 09/17 0701 - 09/18 0700 In: 656.3 [I.V.:656.3] Out: 400 [Urine:400]  Physical Exam: Not performed today   Portacath/PICC-without erythema  Lab Results:  Recent Labs  01/19/17 0821 01/20/17 0529  WBC 5.3 5.3  HGB 10.2* 10.3*  HCT 32.1* 32.3*  PLT 222 230    BMET  Recent Labs  01/19/17 0340 01/20/17 0529  NA 140 141  K 5.1 5.0  CL 105 105  CO2 26 25  GLUCOSE 106* 85  BUN 32* 40*  CREATININE 1.71* 1.92*  CALCIUM 8.2* 8.3*    Lab Results  Component Value Date   CEA1 3.4 07/19/2015    Studies/Results: Dg Abd 1 View  Result Date: 01/19/2017 CLINICAL DATA:  Nausea and vomiting EXAM: ABDOMEN - 1 VIEW COMPARISON:  CT abdomen and pelvis 10/20/2016 FINDINGS: No large or small bowel distention. There is evidence of fold thickening and mid abdominal and left upper quadrant small bowel loops. This may indicate changes of enteritis. No radiopaque stones demonstrated. Vascular calcifications. Degenerative changes in the spine and hips. IMPRESSION: Nonobstructive bowel gas pattern. Fold thickening of small bowel loops may indicate enteritis. Electronically Signed   By: Lucienne Capers M.D.   On: 01/19/2017 01:31    Medications: I have reviewed the patient's current medications.  Assessment/Plan:  1. Gastric Cancer  Lesser curvature gastric body mass noted on endoscopy 06/27/2015, biopsy confirmed adenocarcinoma  Staging CT scans to 06/28/2015 with a gastric body mass, adenopathy adjacent to the stomach, chest adenopathy, a single enhancing liver lesion, and indeterminate nodular pulmonary  lesions  PET scan 07/12/2015-hypermetabolic gastric mass, gastrohepatic ligament metastases, metastasis at the left adrenal gland, hypermetabolic right paratracheal, right hilar, and subcarinal nodes moderate metabolic activity involving 2 pulmonary lesions  Cycle 1 FOLFOX 07/19/2015  Cycle 2 FOLFOX 08/02/2015  Cycle 3 FOLFOX 08/23/2015 with Neulasta support  Cycle 4 FOLFOX 09/06/2015 with Neulasta support  Cycle 5 FOLFOX 09/20/2015 with Neulasta support  Restaging CTs 10/02/2015-decrease in chest and perigastric lymphadenopathy, decrease thickening at the distal stomach, 11 mm sclerotic lesion in the posterior left third rib-barely visible on the PET/CT 07/12/2015  Cycle 6 FOLFOX 10/04/2015  Cycle 7 FOLFOX 10/18/2015  Cycle 8 FOLFOX 11/08/2015 (oxaliplatin held due to neuropathy)  Cycle 9 FOLFOX 11/22/2015 (oxaliplatin held secondary to neuropathy)  Cycle 10 FOLFOX 12/06/2015 (oxaliplatin held secondary to neuropathy)  CTs 12/24/2015-stable lung nodules and mediastinal lymph nodes, no evidence of disease progression  Upper endoscopy 08/13/2016 showed a large ulcerated circumferential mass with no bleeding in the gastric antrum and at the pylorus causing partial outlet obstruction.  Initiation of infusional 5-FU and radiation 08/21/2016;completed 09/10/2016  CTs06/18/2018-enlargement of a pleural-based right upper lobe nodule, other lung nodules are unchanged, no evidence of metastatic disease in the abdomen/pelvis  CT chest 12/12/2016-mild decrease in size of dominantpulmonary nodule medial right upper lobe. Other bilateral subcentimeter pulmonary nodulesshowed no significant change. New abdominal ascites.  Paracentesis 12/26/2016-cytology positive for adenocarcinoma  2. COPD  3. Nonischemic cardiomyopathy  4. Implanted cardiac defibrillator  5. History of prostate cancer  6. Hypertension  7. History of Iron deficiency anemia-hemoglobin  lower03/20/2018  8. Neutropenia  secondary to chemotherapy-Neulasta added with cycle 3 FOLFOX 08/23/2015  9. 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 Anemia secondary to blood loss related to #1. Blood transfusion 08/12/2016,08/16/2016.Hemoglobin improved 09/04/2016.Hemoglobin stable 09/18/2016.  12. 08/28/2016-hyperpigmentation with skin induration surrounding the Port-A-Cath, most likely a tape or skin cleanser reaction; 09/18/2016 the skin has healed; marked hyperpigmentation persists.  13.  Admission 01/18/2017 with nausea/vomiting and coffee-ground emesis-likely gastric outlet obstruction related to tumor   Mr. Heiland  will undergo an upper endoscopy and possible palliative stent placement today. He has metastatic gastric cancer with abdominal carcinomatosis. I discussed the poor prognosis with Mr. Lofton and his son. I do not recommend further chemotherapy. He agrees to home Hospice care.  We began a discussion regarding CPR and ACLS issues. He reports previously discussing the option of turning off the defibrillator with Dr. Lovena Le. He did not make a decision on CODE STATUS this morning.  Recommendations: 1.  Endoscopy/stent placement per Dr. Henrene Pastor 2. [facility] hospice referral for home care 3. Continue discussions regarding CODE STATUS 4. Palliative therapeutic paracentesis prior to discharge  Outpatient follow-up will be scheduled at the [facility].     LOS: 1 day   Donneta Romberg, MD   01/20/2017, 9:20 AM

## 2017-01-20 NOTE — H&P (View-Only) (Signed)
Referring Provider: [facility]  Primary Care Physician:  Janith Lima, MD Primary Gastroenterologist:  Jolly Mango,   MD  Reason for Consultation:  GI bleed  ASSESSMENT AND PLAN:   3. 82 yo male with metastatic gastric adenocarcinoma with malignant ascites requiring paracentesis. Followed by [name]. At last visit with Dr. [name] in late August discussion held regarding goals. Patient was to think about comfort care vrs Hospice vrs salvage chemo.   2. Progressive nausea / vomiting / weight loss. Last EGD in April revealed large gastric mass with resulting partial gastric outlet obstruction. 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 Plain films this admission negative for obstruction but probably self-decompressed after several episodes of vomiting.   3. Coffee ground emesis. Not surprising in setting of gastric mass and repeated episodes of vomiting.  -doubt EGD would be of therapeutic benefit.  -continue BID PPI. -Hopefully he can tolerate sips at some point.  -Have goals of care been clarified. I know patient hasn't yet been back to see Dr. [name] for further discussion of things.  HPI: [name] is a 82 y.o. male with multiple medical problems not limited to nonischemic cardiomyopathy s/p ICD, COPD and diabetes. In Feb 2017 he was diagnosed with metastatic gastric cancer, he is followed by Dr. [name] and at last visit in late August there was discussion regarding goals of care. Patient was given option of comfort care, trial of salvage chemo or Hospice. He was to return to Dr. [name] in two weeks. [name] was admitted last night with vomiting / coffee ground emesis. His hgb has declined from baseline of 11.8 to 10.2 but stable overnight. He is getting BID IV PPI  Patient has been having intermittent vomiting but it became acute worse two days ago. He has been downgrading diet himself but got to point where even liquids wouldn't stay down. He reports a 25 pound  weight loss. Two days ago emesis turned dark brown. Last episode of coffee ground emesis was yesterday. Stools normal color. No nausea today but he is NPO. No abdominal pain, just "sore". He tried anti-emetics at home but they didn't help. Most recent EGD in April of this year showed the large circumferential mass causing partial SBO.   Past Medical History:  Diagnosis Date  . AICD (automatic cardioverter/defibrillator) present   . Anemia   . Arthritis       . Asthma   . Chronic systolic heart failure ([place])   . Colon polyps   . COPD (chronic obstructive pulmonary disease) ([facility])    FEV1 2.68/72%, FEV1/FVC 0.63 (08/22/04  . History of blood transfusion   . HTN (hypertension)   . Hyperlipidemia   . LBBB (left bundle branch block)    [name] 06/20/2014  . Malignant neoplasm pyloric antrum ([place])   . Non-ischemic cardiomyopathy ([place])    a. s/p STJ CRTD 07/2014  . Pneumonia   . Primary cancer of stomach with metastasis to other site ([place]) 07/12/2015  . Prostate cancer ([place]) 2010  . Shortness of breath dyspnea     Past Surgical History:  Procedure Laterality Date  . BI-VENTRICULAR IMPLANTABLE CARDIOVERTER DEFIBRILLATOR N/A 07/17/2014   STJ CRTD implanted by Dr Lovena Le  . CATARACT EXTRACTION W/ INTRAOCULAR LENS  IMPLANT, BILATERAL Bilateral 2010  . COLONOSCOPY    . ESOPHAGOGASTRODUODENOSCOPY (EGD) WITH PROPOFOL N/A 06/27/2015   Procedure: ESOPHAGOGASTRODUODENOSCOPY (EGD) WITH PROPOFOL;  Surgeon: Manus Gunning, MD;  Location: [place];  Service: Gastroenterology;  Laterality: N/A;  . ESOPHAGOGASTRODUODENOSCOPY (EGD)  WITH PROPOFOL N/A 08/13/2016   Procedure: ESOPHAGOGASTRODUODENOSCOPY (EGD) WITH PROPOFOL;  Surgeon: Manus Gunning, MD;  Location: [facility];  Service: Gastroenterology;  Laterality: N/A;  . IR PARACENTESIS  01/12/2017  . LEFT HEART CATHETERIZATION WITH CORONARY ANGIOGRAM N/A 01/04/2014   no obstructive CAD  . PORTACATH PLACEMENT N/A 07/11/2015   Procedure:  INSERTION PORT-A-CATH WITH ULTRA SOUND GUIDANCE;  Surgeon: Stark Klein, MD;  Location: [facility];  Service: General;  Laterality: N/A;  . PROSTATECTOMY    . ROBOT ASSISTED LAPAROSCOPIC RADICAL PROSTATECTOMY  2010    Prior to Admission medications   Medication Sig Start Date End Date Taking? Authorizing Provider  acetaminophen (TYLENOL) 500 MG tablet Take 1,000 mg by mouth daily as needed for fever or headache.   Yes [provider]  amLODipine (NORVASC) 10 MG tablet TAKE 1 TABLET(10 MG) BY MOUTH DAILY 10/16/16  Yes Janith Lima, MD  atorvastatin (LIPITOR) 40 MG tablet TAKE 1 TABLET BY MOUTH DAILY 09/04/16  Yes Fay Records, MD  budesonide-formoterol [facility]) 160-4.5 MCG/ACT inhaler Inhale 2 puffs then rinse mouth, twice daily 02/11/16  Yes Young, Clinton D, MD  carvedilol (COREG) 6.25 MG tablet TAKE 1 TABLET(6.25 MG) BY MOUTH TWICE DAILY WITH A MEAL 11/16/16  Yes Janith Lima, MD  Fe Cbn-Fe Gluc-FA-B12-C-DSS (FERRALET 90) 90-1 MG TABS Take 1 tablet by mouth daily. 07/09/15  Yes [provider]  irbesartan (AVAPRO) 300 MG tablet Take 1 tablet (300 mg total) by mouth daily. 12/04/16  Yes Janith Lima, MD  lidocaine-prilocaine (EMLA) cream Apply 1 application topically as needed. Apply to [facility] 1 hour prior to stick and cover w/plastic wrap 07/11/15  Yes Ladell Pier, MD  omeprazole (PRILOSEC) 40 MG capsule Take 1 capsule (40 mg total) by mouth daily. 02/18/16  Yes Janith Lima, MD  Polyethyl Glycol-Propyl Glycol (SYSTANE OP) Place 1 drop into both eyes daily.    Yes [provider]  potassium chloride SA (K-DUR,KLOR-CON) 20 MEQ tablet Take 1 tablet (20 mEq total) by mouth daily. 06/10/16  Yes Owens Shark, NP  theophylline (UNIPHYL) 400 MG 24 hr tablet Take 400 mg by mouth daily.   Yes [provider]  HYDROcodone-homatropine (HYCODAN) 5-1.5 MG/5ML syrup Take 5 mLs by mouth every 6 (six) hours as needed for cough. Patient not taking: Reported on 01/18/2017  08/12/16   Deneise Lever, MD  prochlorperazine (COMPAZINE) 10 MG tablet Take 1 tablet (10 mg total) by mouth every 6 (six) hours as needed for nausea or vomiting. Patient not taking: Reported on 01/18/2017 12/29/16   Ladell Pier, MD    Current Facility-Administered Medications  Medication Dose Route Frequency Provider Last Rate Last Dose  . acetaminophen (TYLENOL) tablet 650 mg  650 mg Oral Q6H PRN Rise Patience, MD       Or  . acetaminophen (TYLENOL) suppository 650 mg  650 mg Rectal Q6H PRN Rise Patience, MD      . carvedilol (COREG) tablet 6.25 mg  6.25 mg Oral BID WC Rise Patience, MD   6.25 mg at 01/19/17 0851  . hydrALAZINE (APRESOLINE) injection 10 mg  10 mg Intravenous Q4H PRN Rise Patience, MD      . mometasone-formoterol [facility]) 200-5 MCG/ACT inhaler 2 puff  2 puff Inhalation BID Rise Patience, MD      . ondansetron [facility]) tablet 4 mg  4 mg Oral Q6H PRN Rise Patience, MD       Or  .  ondansetron (ZOFRAN) injection 4 mg  4 mg Intravenous Q6H PRN Rise Patience, MD      . pantoprazole (PROTONIX) 80 mg in sodium chloride 0.9 % 250 mL (0.32 mg/mL) infusion  8 mg/hr Intravenous Continuous Rise Patience, MD 25 mL/hr at 01/19/17 0223 8 mg/hr at 01/19/17 0223  . [START ON 01/22/2017] pantoprazole (PROTONIX) injection 40 mg  40 mg Intravenous Q12H Rise Patience, MD      . theophylline (UNIPHYL) 400 MG 24 hr tablet 400 mg  400 mg Oral Daily Rise Patience, MD        Allergies as of 01/18/2017 - Review Complete 01/18/2017  Allergen Reaction Noted  . Adhesive [tape]  08/29/2016  . Other Rash 08/29/2016    Family History  Problem Relation Age of Onset  . Arthritis Other   . Esophagitis Mother        STRICTURE  . Stroke Mother   . Hypertension Mother   . Prostate cancer Father   . Hypertension Father   . Heart attack Neg Hx     Social History   Social History  . Marital status: Married    Spouse name:  Peter Congo  . Number of children: 2  . Years of education: N/A   Occupational History  . Retired    Social History Main Topics  . Smoking status: Former Smoker    Packs/day: 1.50    Years: 40.00    Types: Cigarettes    Quit date: 05/05/2002  . Smokeless tobacco: Never Used  . Alcohol use No  . Drug use: No  . Sexual activity: Not Currently   Other Topics Concern  . Not on file   Social History Narrative   Regular Exercise -  NO   Married, wife Peter Congo   #2 sons-[name] and Juanda Crumble       Review of Systems: Positive for SOB (chronic) All other systems reviewed and negative except where noted in HPI.  Physical Exam: Vital signs in last 24 hours: Temp:  [97.5 F (36.4 C)-98.3 F (36.8 C)] 98.3 F (36.8 C) (09/16 2255) Pulse Rate:  [77-94] 77 (09/16 2255) Resp:  [15-16] 16 (09/16 2255) BP: (128-137)/(72-80) 137/72 (09/16 2255) SpO2:  [92 %-97 %] 97 % (09/16 2255) Weight:  [202 lb 13.2 oz (92 kg)] 202 lb 13.2 oz (92 kg) (09/16 2255) Last BM Date: 01/18/17 General:   Alert, well-developed,  Black male in NAD. Son in room Psych:  Pleasant, cooperative. Normal mood and affect. Eyes:  Pupils equal, sclera clear, no icterus.   Conjunctiva pink. Ears:  Normal auditory acuity. Nose:  No deformity, discharge,  or lesions. Neck:  Supple; no masses Lungs:  A few RLL crackles, o/w clear throughout to auscultation.    Heart:  Regular rate and rhythm; no edema Abdomen:  Soft, non-distended, nontender, BS active  Rectal:  Deferred  Msk:  Symmetrical without gross deformities. . Pulses:  Normal pulses noted. Neurologic:  Alert and  oriented x4;  grossly normal neurologically. Skin:  Intact without significant lesions or rashes..   Intake/Output from previous day: 09/16 0701 - 09/17 0700 In: 90.4 [I.V.:90.4] Out: 80 [Urine:80] Intake/Output this shift: No intake/output data recorded.  Lab Results:  Recent Labs  01/18/17 2328 01/19/17 0340 01/19/17 0821  WBC 6.5 5.8  5.3  HGB 10.8* 10.0* 10.2*  HCT 33.4* 31.1* 32.1*  PLT 247 220 222   BMET  Recent Labs  01/18/17 2000 01/19/17 0340  NA 138 140  K 5.1 5.1  CL 99* 105  CO2 28 26  GLUCOSE 127* 106*  BUN 29* 32*  CREATININE 1.76* 1.71*  CALCIUM 8.9 8.2*   LFT  Recent Labs  01/18/17 2000  PROT 7.4  ALBUMIN 2.9*  AST 38  ALT 19  ALKPHOS 121  BILITOT 0.8   PT/INR  Recent Labs  01/18/17 2000  LABPROT 13.4  INR 1.03   Hepatitis Panel No results for input(s): HEPBSAG, HCVAB, HEPAIGM, HEPBIGM in the last 72 hours.   Studies/Results: Dg Abd 1 View  Result Date: 01/19/2017 CLINICAL DATA:  Nausea and vomiting EXAM: ABDOMEN - 1 VIEW COMPARISON:  CT abdomen and pelvis 10/20/2016 FINDINGS: No large or small bowel distention. There is evidence of fold thickening and mid abdominal and left upper quadrant small bowel loops. This may indicate changes of enteritis. No radiopaque stones demonstrated. Vascular calcifications. Degenerative changes in the spine and hips. IMPRESSION: Nonobstructive bowel gas pattern. Fold thickening of small bowel loops may indicate enteritis. Electronically Signed   By: Lucienne Capers M.D.   On: 01/19/2017 01:31    Tye Savoy, NP-C @  01/19/2017, 9:38 AM Pager number [phone]  GI ATTENDING  History, laboratories, x-rays, prior endoscopy reports acute. Patient personally seen and examined. Multiple family members in room. Agree with comprehensive consultation note as outlined above. Patient has advanced gastric cancer with metastases to the abdomen with malignant ascites. Has undergone chemotherapy previously as outlined. Presents now with recurrent vomiting likely secondary to gastric outlet obstruction from tumor. My plan is for upper endoscopy with possible placement of self-expanding metal stent to relieve obstruction. I discussed the nature procedure as well as the risks (including perforation with death), benefits (ability to eat without vomiting), and  alternatives (such as surgery, gastrostomy, NG tube, or nothing). Instructional diagram provided personally. They understood and had all questions answered to their satisfaction. Tentative plans for endoscopy tomorrow.  Docia Chuck. Geri Seminole., M.D. [facility] Division of Gastroenterology.

## 2017-01-20 NOTE — Significant Event (Signed)
Rapid Response Event Note  Overview: Time Called: 1900 Arrival Time: 1902 Event Type: Respiratory  Initial Focused Assessment: Pt lying in bed NRB with O2 88.  Chest xray just obtained, showing infiltrates, pt with noted crackles, clears with cough.  Incentive spirometer encouraged every hour, results called to MD with new orders for abx.  Decreased O2 to venturi.  Pt O2 95%, again highly encouraged IS use.   Interventions:  Plan of Care (if not transferred):  Event Summary:   at      at          [facility]

## 2017-01-20 NOTE — Interval H&P Note (Signed)
History and Physical Interval Note:  01/20/2017 1:05 PM  [name]  has presented today for surgery, with the diagnosis of vomiting, gastrc cancer, probably gastric outlet obstruction  The various methods of treatment have been discussed with the patient and family. After consideration of risks, benefits and other options for treatment, the patient has consented to  Procedure(s) with comments: EGD with stent placement for relief of gastric outlet obstruction related to gastric mass (N/A) - Please follow his last case.  as a surgical intervention .  The patient's history has been reviewed, patient examined, no change in status, stable for surgery.  I have reviewed the patient's chart and labs.  Questions were answered to the patient's satisfaction.     Scarlette Shorts

## 2017-01-20 NOTE — Progress Notes (Signed)
Patient c/o new onset shortness of breath.  O2 saturations sustaining in the mid 80's on RA.  Placed on 3L [place].  PT also c/o chest tightness. MD made aware.  EKG completed, nitro and morphine given per MD orders.  Chest x-ray completed.  PT o2 continuing drop into the 80's, o2 increased to 6L.  On 6L pt saturations in the high 80's.  Respirations 30.  Rapid response called to assess situation. Chest x-ray showing new infiltrates.  PT placed on venti mask.  PT o2 sustaining in the 90's on venti mask. Respiratory called, stated they would be up to room to assess pt shortly.  Pt resting comfortably in the bed. Report given to night RN.  On-call MD made aware of situation.

## 2017-01-20 NOTE — Progress Notes (Signed)
Pt currently in Endoscopy procedure.   Spoke with patient's sons regarding their desire to notarize power of attorney.   The paperwork they have is durable power of attorney for pt's spouse.  Spouse has early stages of dementia.  Informed sons that I only notarize healthcare power of attorneys.  Provided basic education around notarization process as they seek notary elsewhere.   Sons understand that person signing document must be of sound mind.   [facility] / [place], MDiv

## 2017-01-20 NOTE — Progress Notes (Signed)
Pharmacy Antibiotic Note  JARQUEZ MESTRE is a 82 y.o. male admitted on 01/18/2017 with acute GIB.  Patient having new onset shortness of breath this evening and CXR obtained showing new infiltrates.  Pharmacy has been consulted for vancomycin and zosyn dosing for possible pneumonia.  Noted AKI.  Plan: Zosyn 3.375g IV q8h (4 hour infusion time).  Vancomycin 1g IV q24h. Daily SCr.  Height: 6\' 2"  (188 cm) Weight: 203 lb (92.1 kg) IBW/kg (Calculated) : 82.2  Temp (24hrs), Avg:98.5 F (36.9 C), Min:97.5 F (36.4 C), Max:99.1 F (37.3 C)   Recent Labs Lab 01/18/17 2000 01/18/17 2328 01/19/17 0340 01/19/17 0821 01/20/17 0529  WBC 6.8 6.5 5.8 5.3 5.3  CREATININE 1.76*  --  1.71*  --  1.92*    Estimated Creatinine Clearance: 36.3 mL/min (A) (by C-G formula based on SCr of 1.92 mg/dL (H)).    Allergies  Allergen Reactions  . Adhesive [Tape]     Reaction to sorbaview, opsite  . Other Rash    Patient allergic to Chlorehexidine     Thank you for allowing pharmacy to be a part of this patient's care.  Hershal Coria 01/20/2017 8:31 PM

## 2017-01-20 NOTE — Anesthesia Procedure Notes (Signed)
Date/Time: 01/20/2017 1:14 PM Performed by: Deliah Boston Pre-anesthesia Checklist: Patient identified, Emergency Drugs available, Suction available and Patient being monitored Patient Re-evaluated:Patient Re-evaluated prior to induction Oxygen Delivery Method: Simple face mask

## 2017-01-20 NOTE — Transfer of Care (Signed)
Immediate Anesthesia Transfer of Care Note  Patient: [name]  Procedure(s) Performed: Procedure(s) with comments: EGD with stent placement for relief of gastric outlet obstruction related to gastric mass (N/A) - Please follow his last case.   Patient Location: PACU  Anesthesia Type:MAC  Level of Consciousness: Patient easily awoken, sedated, comfortable, cooperative, following commands, responds to stimulation.   Airway & Oxygen Therapy: Patient spontaneously breathing, ventilating well, oxygen via simple oxygen mask.  Post-op Assessment: Report given to PACU RN, vital signs reviewed and stable, moving all extremities.   Post vital signs: Reviewed and stable.  Complications: No apparent anesthesia complications Last Vitals:  Vitals:   01/20/17 1040 01/20/17 1224  BP:  132/68  Pulse:    Resp:  20  Temp:  36.7 C  SpO2: 93% 94%    Last Pain:  Vitals:   01/20/17 1224  TempSrc: Oral  PainSc:          Complications: No apparent anesthesia complications

## 2017-01-20 NOTE — Anesthesia Preprocedure Evaluation (Signed)
Anesthesia Evaluation  Patient identified by MRN, date of birth, ID band Patient awake    Reviewed: Allergy & Precautions, NPO status , Patient's Chart, lab work & pertinent test results, reviewed documented beta blocker date and time   Airway Mallampati: II  TM Distance: >3 FB Neck ROM: Full    Dental no notable dental hx.    Pulmonary asthma , COPD, former smoker,    breath sounds clear to auscultation + decreased breath sounds      Cardiovascular hypertension, Pt. on medications and Pt. on home beta blockers Normal cardiovascular exam+ Cardiac Defibrillator  Rhythm:Regular Rate:Normal     Neuro/Psych negative neurological ROS  negative psych ROS   GI/Hepatic negative GI ROS, Neg liver ROS,   Endo/Other  diabetes, Type 2, Oral Hypoglycemic Agents  Renal/GU Renal InsufficiencyRenal diseasenegative Renal ROS  negative genitourinary   Musculoskeletal negative musculoskeletal ROS (+) Arthritis ,   Abdominal   Peds negative pediatric ROS (+)  Hematology  (+) anemia ,   Anesthesia Other Findings   Reproductive/Obstetrics negative OB ROS                             Anesthesia Physical  Anesthesia Plan  ASA: IV  Anesthesia Plan: MAC   Post-op Pain Management:    Induction: Intravenous  PONV Risk Score and Plan: 1 and Ondansetron  Airway Management Planned: Nasal Cannula  Additional Equipment:   Intra-op Plan:   Post-operative Plan:   Informed Consent: I have reviewed the patients History and Physical, chart, labs and discussed the procedure including the risks, benefits and alternatives for the proposed anesthesia with the patient or authorized representative who has indicated his/her understanding and acceptance.   Dental advisory given  Plan Discussed with: CRNA and Surgeon  Anesthesia Plan Comments:         Anesthesia Quick Evaluation

## 2017-01-20 NOTE — Progress Notes (Signed)
PROGRESS NOTE    [name]  FIE:[id number] DOB: 03/22/1935 DOA: 01/18/2017 PCP: Janith Lima, MD    Brief Narrative: [name] is a 82 y.o. male with history of gastric adenocarcinoma and lung mass had undergone chemotherapy and radiation therapy with history of prostate cancer in remission who has had paracentesis last week for ascites and since then patient has been having persistent vomiting. Patient vomitus has been coffee-ground. Denies any diarrhea. Has been having some abdominal discomfort which has been diffuse.   ED Course: In the ER patient's hemoglobin appears to be at baseline. Creatinine has worsened from baseline. Patient is hemodynamically stable. Was started on IV Protonix. Patient is being admitted for further management of acute GI bleed. [place] gastroenterologist Dr. Hilarie Fredrickson was notified by the ER physician.   Assessment & Plan:   Principal Problem:   Acute GI bleeding Active Problems:   Essential hypertension   COPD mixed type ([place])   Non-ischemic cardiomyopathy ([place])   Primary cancer of stomach with metastasis to other site [place])   Acute kidney injury ([place])   Gastric outlet obstruction  1-Coffee ground Emesis; in setting of gastric cancer; Gastric Outlet obstruction;  Continue with IV Protonix.  NPO, plan for endoscopy, stent placement today.  Dr [name] following. Patient will be going home with hospice.  Monitor for HF exacerbation.  KUB. No obstructive bowel pattern, thickening small bowel may indicate enteritis.   2-AKI; due to vomiting.  Hold ARB.  IV fluids. Increase rate due to worsening cr  3-Acute blood loss anemia; hb stable.   4-COPD; continue with theophylline.   5-Non ischemic Cardiomyopathy, S/P pacemaker.  Continue with coreg. Hold ARB.  EF 30 %. Monitor on IV fluids.   6-HTN; hydralazine PRN.   7-Adenocarcinoma of the stomach and lung mass and history of prostate cancer being followed by oncologist. ascites  secondary to metastasis.     DVT prophylaxis: SCD>  Code Status: full code.  Family Communication: care discussed with 2 sons at bedside.  Disposition Plan: to be determine.    Consultants:   GI  Oncology    Procedures:   none   Antimicrobials:  none   Subjective: No further vomiting, denies dyspnea. He is doing ok.    Objective: Vitals:   01/20/17 1410 01/20/17 1420 01/20/17 1430 01/20/17 1440  BP: (!) 131/59 (!) 124/58 130/64   Pulse: 70 70 70 70  Resp: (!) 23 (!) 23 (!) 26 (!) 28  Temp:      TempSrc:      SpO2: 96% (!) 88% 94% 94%  Weight:      Height:        Intake/Output Summary (Last 24 hours) at 01/20/17 1453 Last data filed at 01/20/17 1341  Gross per 24 hour  Intake          1456.34 ml  Output              300 ml  Net          1156.34 ml   Filed Weights   01/18/17 2255 01/20/17 1224  Weight: 92 kg (202 lb 13.2 oz) 92.1 kg (203 lb)    Examination:  General exam: NAD Respiratory system: CTA Cardiovascular system: S 1, S 2 RRR Gastrointestinal system: abdomen is distended, no rigidity  Central nervous system: non focal.  Extremities: Symmetric 5 x 5 power. Skin: No rashes, lesions or ulcers     Data Reviewed: I have personally reviewed following labs and imaging  studies  CBC:  Recent Labs Lab 01/18/17 2000 01/18/17 2328 01/19/17 0340 01/19/17 0821 01/20/17 0529  WBC 6.8 6.5 5.8 5.3 5.3  HGB 11.8* 10.8* 10.0* 10.2* 10.3*  HCT 36.5* 33.4* 31.1* 32.1* 32.3*  MCV 82.0 82.9 80.8 80.9 82.2  PLT 258 247 220 222 546   Basic Metabolic Panel:  Recent Labs Lab 01/18/17 2000 01/19/17 0340 01/20/17 0529  NA 138 140 141  K 5.1 5.1 5.0  CL 99* 105 105  CO2 28 26 25   GLUCOSE 127* 106* 85  BUN 29* 32* 40*  CREATININE 1.76* 1.71* 1.92*  CALCIUM 8.9 8.2* 8.3*   GFR: Estimated Creatinine Clearance: 36.3 mL/min (A) (by C-G formula based on SCr of 1.92 mg/dL (H)). Liver Function Tests:  Recent Labs Lab 01/18/17 2000  AST 38    ALT 19  ALKPHOS 121  BILITOT 0.8  PROT 7.4  ALBUMIN 2.9*    Recent Labs Lab 01/18/17 2000  LIPASE 37   No results for input(s): AMMONIA in the last 168 hours. Coagulation Profile:  Recent Labs Lab 01/18/17 2000  INR 1.03   Cardiac Enzymes: No results for input(s): CKTOTAL, CKMB, CKMBINDEX, TROPONINI in the last 168 hours. BNP (last 3 results) No results for input(s): PROBNP in the last 8760 hours. HbA1C: No results for input(s): HGBA1C in the last 72 hours. CBG:  Recent Labs Lab 01/19/17 2151 01/20/17 0809 01/20/17 1241  GLUCAP 93 79 82   Lipid Profile: No results for input(s): CHOL, HDL, LDLCALC, TRIG, CHOLHDL, LDLDIRECT in the last 72 hours. Thyroid Function Tests: No results for input(s): TSH, T4TOTAL, FREET4, T3FREE, THYROIDAB in the last 72 hours. Anemia Panel: No results for input(s): VITAMINB12, FOLATE, FERRITIN, TIBC, IRON, RETICCTPCT in the last 72 hours. Sepsis Labs: No results for input(s): PROCALCITON, LATICACIDVEN in the last 168 hours.  No results found for this or any previous visit (from the past 240 hour(s)).       Radiology Studies: Dg Abd 1 View - Kub  Result Date: 01/20/2017 CLINICAL DATA:  Gastric outlet obstruction. EXAM: ABDOMEN COMPARISON:  KUB 01/19/2017. FINDINGS: Intraprocedural spot films are noted. Stent is noted placed over the upper abdomen. Clinical correlation suggested. Eight images obtained. 342 seconds fluoroscopy time. Radiation dose 204 scratched mGy. IMPRESSION: Intra procedures spot films as above. Electronically Signed   By: Marcello Moores  Register   On: 01/20/2017 14:23   Dg Abd 1 View  Result Date: 01/19/2017 CLINICAL DATA:  Nausea and vomiting EXAM: ABDOMEN - 1 VIEW COMPARISON:  CT abdomen and pelvis 10/20/2016 FINDINGS: No large or small bowel distention. There is evidence of fold thickening and mid abdominal and left upper quadrant small bowel loops. This may indicate changes of enteritis. No radiopaque stones  demonstrated. Vascular calcifications. Degenerative changes in the spine and hips. IMPRESSION: Nonobstructive bowel gas pattern. Fold thickening of small bowel loops may indicate enteritis. Electronically Signed   By: Lucienne Capers M.D.   On: 01/19/2017 01:31   Dg C-arm 1-60 Min-no Report  Result Date: 01/20/2017 Fluoroscopy was utilized by the requesting physician.  No radiographic interpretation.        Scheduled Meds: . [MAR Hold] carvedilol  6.25 mg Oral BID WC  . [MAR Hold] mometasone-formoterol  2 puff Inhalation BID  . [MAR Hold] pantoprazole  40 mg Intravenous Q12H  . [MAR Hold] theophylline  400 mg Oral Daily   Continuous Infusions: . sodium chloride 20 mL/hr at 01/19/17 1656  . pantoprozole (PROTONIX) infusion 8 mg/hr (01/20/17 0319)  LOS: 1 day    Time spent: 35 minutes.     Elmarie Shiley, MD [facility] Pager [phone]  If 7PM-7AM, please contact night-coverage [email or web address] Password [facility] 01/20/2017, 2:53 PM

## 2017-01-20 NOTE — Op Note (Signed)
[facility] Patient Name: [name] Procedure Date: 01/20/2017 MRN: [id number] Attending MD: Docia Chuck. Henrene Pastor , MD Date of Birth: 18-Oct-1934 CSN: [id number] Age: 82 Admit Type: Outpatient Procedure:                Upper GI endoscopy, with enteral stent placement Indications:              Suspected gastric outlet obstruction, For therapy                            of gastric outlet obstruction. Known gastric cancer                            with obstructive symptoms Providers:                Docia Chuck. Henrene Pastor, MD, Cleda Daub, RN, Alan Mulder, Technician Referring MD:             [facility] Medicines:                Monitored Anesthesia Care Complications:            No immediate complications. Estimated Blood Loss:     Estimated blood loss: none. Procedure:                Pre-Anesthesia Assessment:                           - Prior to the procedure, a History and Physical                            was performed, and patient medications and                            allergies were reviewed. The patient's tolerance of                            previous anesthesia was also reviewed. The risks                            and benefits of the procedure and the sedation                            options and risks were discussed with the patient.                            All questions were answered, and informed consent                            was obtained. Prior Anticoagulants: The patient has                            taken no previous anticoagulant or antiplatelet  agents. ASA Grade Assessment: III - A patient with                            severe systemic disease. After reviewing the risks                            and benefits, the patient was deemed in                            satisfactory condition to undergo the procedure.                           After obtaining informed consent, the  endoscope was                            passed under direct vision. Throughout the                            procedure, the patient's blood pressure, pulse, and                            oxygen saturations were monitored continuously. The                            [id number]) scope was introduced through the                            mouth, and advanced to the antrum of the stomach.                            The upper GI endoscopy was accomplished without                            difficulty. The patient tolerated the procedure                            well. Scope In: Scope Out: Findings:      Moderately severe esophagitis was found. This was secondary to recurrent       vomiting. Small hiatal hernia.      A large, ulcerated, circumferential mass with no bleeding was found in       the prepyloric region of the stomach. There was associated friability       and high-grade stenosis. An ERCP balloon catheter was used to intubate       the stenosis. A soft flexible guidewire was placed into the third       portion of the duodenum with coiling in excellent position. Subsequently       endoluminal stenting was performed. This was stented with a 22 mm x 6 cm       WallFlex stent. Fluoroscopy used throughout. Good position was confirmed       endoscopically and radiographically.      Scope was placed into a portion of the stent with the duodenum observed Impression:               - Moderately  severe esophagitis.                           - Malignant gastric tumor in the prepyloric region                            of the stomach. Prosthesis placed.                           - No specimens collected. Moderate Sedation:      none Recommendation:           1. Clear liquid diet                           2. PPI                           3. If doing well may go home tomorrow with                            significantly modified diet (liquids and pured                             substances only)                           4. GI will follow                           CASE DISCUSSED WITH FAMILY AND PROCEDURE REPORT                            PROVIDED TO THEM. 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 All rights reserved. The codes documented in this report are preliminary and  upon coder review may  be revised to meet current compliance requirements. Docia Chuck. Henrene Pastor, MD 01/20/2017 1:48:53 PM This report has been signed electronically. Number of Addenda: 0

## 2017-01-21 ENCOUNTER — Telehealth: Payer: Self-pay | Admitting: Oncology

## 2017-01-21 ENCOUNTER — Encounter (HOSPITAL_COMMUNITY): Payer: Self-pay | Admitting: Internal Medicine

## 2017-01-21 ENCOUNTER — Inpatient Hospital Stay (HOSPITAL_COMMUNITY): Payer: Medicare HMO

## 2017-01-21 DIAGNOSIS — R188 Other ascites: Secondary | ICD-10-CM

## 2017-01-21 DIAGNOSIS — I10 Essential (primary) hypertension: Secondary | ICD-10-CM

## 2017-01-21 DIAGNOSIS — C169 Malignant neoplasm of stomach, unspecified: Secondary | ICD-10-CM

## 2017-01-21 DIAGNOSIS — J449 Chronic obstructive pulmonary disease, unspecified: Secondary | ICD-10-CM

## 2017-01-21 DIAGNOSIS — K922 Gastrointestinal hemorrhage, unspecified: Secondary | ICD-10-CM

## 2017-01-21 DIAGNOSIS — N179 Acute kidney failure, unspecified: Secondary | ICD-10-CM

## 2017-01-21 LAB — CBC
HEMATOCRIT: 32.3 % — AB (ref 39.0–52.0)
HEMOGLOBIN: 10.2 g/dL — AB (ref 13.0–17.0)
MCH: 26.4 pg (ref 26.0–34.0)
MCHC: 31.6 g/dL (ref 30.0–36.0)
MCV: 83.7 fL (ref 78.0–100.0)
Platelets: 223 10*3/uL (ref 150–400)
RBC: 3.86 MIL/uL — ABNORMAL LOW (ref 4.22–5.81)
RDW: 16.4 % — ABNORMAL HIGH (ref 11.5–15.5)
WBC: 10.2 10*3/uL (ref 4.0–10.5)

## 2017-01-21 LAB — TROPONIN I
TROPONIN I: 0.03 ng/mL — AB (ref <0.03)
TROPONIN I: 0.03 ng/mL — AB (ref <0.03)

## 2017-01-21 LAB — BASIC METABOLIC PANEL
ANION GAP: 12 (ref 5–15)
BUN: 48 mg/dL — ABNORMAL HIGH (ref 6–20)
CALCIUM: 8.2 mg/dL — AB (ref 8.9–10.3)
CO2: 23 mmol/L (ref 22–32)
Chloride: 104 mmol/L (ref 101–111)
Creatinine, Ser: 2.54 mg/dL — ABNORMAL HIGH (ref 0.61–1.24)
GFR calc Af Amer: 26 mL/min — ABNORMAL LOW (ref >60)
GFR calc non Af Amer: 22 mL/min — ABNORMAL LOW (ref >60)
GLUCOSE: 174 mg/dL — AB (ref 65–99)
POTASSIUM: 5.2 mmol/L — AB (ref 3.5–5.1)
SODIUM: 139 mmol/L (ref 135–145)

## 2017-01-21 LAB — GLUCOSE, CAPILLARY
Glucose-Capillary: 137 mg/dL — ABNORMAL HIGH (ref 65–99)
Glucose-Capillary: 142 mg/dL — ABNORMAL HIGH (ref 65–99)
Glucose-Capillary: 159 mg/dL — ABNORMAL HIGH (ref 65–99)
Glucose-Capillary: 163 mg/dL — ABNORMAL HIGH (ref 65–99)

## 2017-01-21 MED ORDER — LIDOCAINE HCL 2 % IJ SOLN
INTRAMUSCULAR | Status: AC
  Filled 2017-01-21: qty 10

## 2017-01-21 MED ORDER — PANTOPRAZOLE SODIUM 40 MG PO TBEC
40.0000 mg | DELAYED_RELEASE_TABLET | Freq: Two times a day (BID) | ORAL | Status: DC
  Administered 2017-01-21 – 2017-01-22 (×2): 40 mg via ORAL
  Filled 2017-01-21 (×2): qty 1

## 2017-01-21 NOTE — Progress Notes (Signed)
[facility] Progress Note  CC:  Gastric cancer  Subjective:  Getting ready to go down for paracentesis.  Had some issues with SOB and low O2 sats overnight.  Looks like Cr is up some today as well.  Has been tolerating a moderate amount of clear liquids well though.  No abdominal pain.  At first after EGD with stent he had two episodes of vomiting some dark material, but has done well since then.  Objective:  Vital signs in last 24 hours: Temp:  [97.5 F (36.4 C)-99.1 F (37.3 C)] 99.1 F (37.3 C) (09/19 0520) Pulse Rate:  [70-90] 70 (09/19 0520) Resp:  [20-28] 21 (09/19 0520) BP: (113-132)/(56-80) 121/58 (09/19 0520) SpO2:  [84 %-99 %] 94 % (09/19 0918) Weight:  [203 lb (92.1 kg)] 203 lb (92.1 kg) (09/18 1224) Last BM Date: 01/16/17 General:  Alert, Well-developed, in NAD Heart:  Regular rate and rhythm; no murmurs Pulm:  CTAB.  No increased WOB. Abdomen:  Soft, non-distended.  BS present.  Non-tender.   Extremities:  Without edema. Neurologic:  Alert and oriented x 4;  grossly normal neurologically. Psych:  Alert and cooperative. Normal mood and affect.  Intake/Output from previous day: 09/18 0701 - 09/19 0700 In: 2675 [P.O.:120; I.V.:2305; IV Piggyback:250] Out: 100 [Urine:100]  Lab Results:  Recent Labs  01/19/17 0821 01/20/17 0529 01/21/17 0109  WBC 5.3 5.3 10.2  HGB 10.2* 10.3* 10.2*  HCT 32.1* 32.3* 32.3*  PLT 222 230 223   BMET  Recent Labs  01/19/17 0340 01/20/17 0529 01/21/17 0109  NA 140 141 139  K 5.1 5.0 5.2*  CL 105 105 104  CO2 26 25 23   GLUCOSE 106* 85 174*  BUN 32* 40* 48*  CREATININE 1.71* 1.92* 2.54*  CALCIUM 8.2* 8.3* 8.2*   LFT  Recent Labs  01/18/17 2000  PROT 7.4  ALBUMIN 2.9*  AST 38  ALT 19  ALKPHOS 121  BILITOT 0.8   PT/INR  Recent Labs  01/18/17 2000  LABPROT 13.4  INR 1.03   Dg Abd 1 View - Kub  Result Date: 01/20/2017 CLINICAL DATA:  Gastric outlet obstruction. EXAM: ABDOMEN COMPARISON:   KUB 01/19/2017. FINDINGS: Intraprocedural spot films are noted. Stent is noted placed over the upper abdomen. Clinical correlation suggested. Eight images obtained. 342 seconds fluoroscopy time. Radiation dose 204 scratched mGy. IMPRESSION: Intra procedures spot films as above. Electronically Signed   By: Marcello Moores  Register   On: 01/20/2017 14:23   Dg Chest Port 1 View  Result Date: 01/20/2017 CLINICAL DATA:  Hypoxemia. EXAM: PORTABLE CHEST 1 VIEW COMPARISON:  August 12, 2016 FINDINGS: New infiltrate in the left lung. The heart, hila, mediastinum, right Port-A-Cath, and pacemaker stable. No pneumothorax. IMPRESSION: New infiltrate in the left lung suspicious for pneumonia. Electronically Signed   By: Dorise Bullion III M.D   On: 01/20/2017 19:07   Dg C-arm 1-60 Min-no Report  Result Date: 01/20/2017 Fluoroscopy was utilized by the requesting physician.  No radiographic interpretation.   Assessment / Plan: 74. 82 yo male with metastatic gastric adenocarcinoma with malignant ascites requiring paracentesis.  Cancer causing symptoms of GOO.  S/p EGD with stent placement on 9/18.  Followed by [name].  Getting another paracentesis today. -Continue BID PPI. -Continue clear liquid diet, at most pureed diet.  *Ok for discharge from GI standpoint when he is deemed ready by the hospitalist service.  Gi signing off.   LOS: 2 days   ZEHR, JESSICA D.  01/21/2017, 10:41  AM  Pager number [phone]  GI ATTENDING  Interval history data reviewed. Patient seen and examined. Agree with interval progress note. Status post endoluminal stent for gastric outlet obstruction. Now tolerating liquids. I discussed with the family is dietary restrictions moving forward (clear liquids and pured foods). He will resume oncologic care with Dr. [name]. GI available if needed. Will sign off.  Docia Chuck. Geri [place]in[place]., M.D. [facility] Division of Gastroenterology

## 2017-01-21 NOTE — Procedures (Signed)
Ultrasound-guided therapeutic paracentesis performed yielding 3.1 liters of turbid, amber fluid. No immediate complications.

## 2017-01-21 NOTE — Care Management Note (Signed)
Case Management Note  Patient Details  Name: [name] MRN: [id number] Date of Birth: November 24, 1934  Subjective/Objective: Received referral for home hospice choice-Per patient defer to sons-met w/[name] c#[phone]) & other son to discuss Home Hospice provider agencies-they both wanted to talk to [place] before deciding on choice-will await choice.                   Action/Plan:d/c home w/hospice.   Expected Discharge Date:                  Expected Discharge Plan:  Home w Hospice Care  In-House Referral:     Discharge planning Services  CM Consult  Post Acute Care Choice:    Choice offered to:  Adult Children  DME Arranged:    DME Agency:     HH Arranged:    HH Agency:     Status of Service:  In process, will continue to follow  If discussed at Long Length of Stay Meetings, dates discussed:    Additional Comments:  Dessa Phi, RN 01/21/2017, 4:21 PM

## 2017-01-21 NOTE — Care Management Note (Signed)
Case Management Note  Patient Details  Name: [name] MRN: [id number] Date of Birth: October 03, 1934  Subjective/Objective:  Sons chose [place] aware-she will f/u in am & manage all dme if needed.No further CM needs.                  Action/Plan:d/c home w/hospice.   Expected Discharge Date:                  Expected Discharge Plan:  Home w Hospice Care  In-House Referral:     Discharge planning Services  CM Consult  Post Acute Care Choice:    Choice offered to:  Adult Children  DME Arranged:    DME Agency:     HH Arranged:    [facility]  Status of Service:  Completed, signed off  If discussed at [place] of Stay Meetings, dates discussed:    Additional Comments:  Dessa Phi, RN 01/21/2017, 4:39 PM

## 2017-01-21 NOTE — Progress Notes (Addendum)
IP PROGRESS NOTE  Subjective:   [name] underwent placement of a gastric stent by Dr. Henrene Pastor yesterday. He is tolerating liquids. He had respiratory distress last night. He feels better this morning.  Objective: Vital signs in last 24 hours: Blood pressure (!) 121/58, pulse 70, temperature 99.1 F (37.3 C), temperature source Oral, resp. rate (!) 21, height 6\' 2"  (1.88 m), weight 203 lb (92.1 kg), SpO2 99 %.  Intake/Output from previous day: 09/18 0701 - 09/19 0700 In: 2675 [P.O.:120; I.V.:2305; IV Piggyback:250] Out: 100 [Urine:100]  Physical Exam: Lungs: Decreased breath sounds with inspiratory rhonchi at the left lower chest, mild expiratory wheeze at the left chest, no respiratory distress Cardiac: Regular rhythm Abdomen: Distended with ascites, nontender Vascular: No leg edema   Portacath/PICC-without erythema  Lab Results:  Recent Labs  01/20/17 0529 01/21/17 0109  WBC 5.3 10.2  HGB 10.3* 10.2*  HCT 32.3* 32.3*  PLT 230 223    BMET  Recent Labs  01/20/17 0529 01/21/17 0109  NA 141 139  K 5.0 5.2*  CL 105 104  CO2 25 23  GLUCOSE 85 174*  BUN 40* 48*  CREATININE 1.92* 2.54*  CALCIUM 8.3* 8.2*    Lab Results  Component Value Date   CEA1 3.4 07/19/2015    Studies/Results: Dg Abd 1 View - Kub  Result Date: 01/20/2017 CLINICAL DATA:  Gastric outlet obstruction. EXAM: ABDOMEN COMPARISON:  KUB 01/19/2017. FINDINGS: Intraprocedural spot films are noted. Stent is noted placed over the upper abdomen. Clinical correlation suggested. Eight images obtained. 342 seconds fluoroscopy time. Radiation dose 204 scratched mGy. IMPRESSION: Intra procedures spot films as above. Electronically Signed   By: Marcello Moores  Register   On: 01/20/2017 14:23   Dg Chest Port 1 View  Result Date: 01/20/2017 CLINICAL DATA:  Hypoxemia. EXAM: PORTABLE CHEST 1 VIEW COMPARISON:  August 12, 2016 FINDINGS: New infiltrate in the left lung. The heart, hila, mediastinum, right Port-A-Cath,  and pacemaker stable. No pneumothorax. IMPRESSION: New infiltrate in the left lung suspicious for pneumonia. Electronically Signed   By: Dorise Bullion III M.D   On: 01/20/2017 19:07   Dg C-arm 1-60 Min-no Report  Result Date: 01/20/2017 Fluoroscopy was utilized by the requesting physician.  No radiographic interpretation.    Medications: I have reviewed the patient's current medications.  Assessment/Plan:  1. Gastric Cancer  Lesser curvature gastric body mass noted on endoscopy 06/27/2015, biopsy confirmed adenocarcinoma  Staging CT scans to 06/28/2015 with a gastric body mass, adenopathy adjacent to the stomach, chest adenopathy, a single enhancing liver lesion, and indeterminate nodular pulmonary lesions  PET scan 07/12/2015-hypermetabolic gastric mass, gastrohepatic ligament metastases, metastasis at the left adrenal gland, hypermetabolic right paratracheal, right hilar, and subcarinal nodes moderate metabolic activity involving 2 pulmonary lesions  Cycle 1 FOLFOX 07/19/2015  Cycle 2 FOLFOX 08/02/2015  Cycle 3 FOLFOX 08/23/2015 with Neulasta support  Cycle 4 FOLFOX 09/06/2015 with Neulasta support  Cycle 5 FOLFOX 09/20/2015 with Neulasta support  Restaging CTs 10/02/2015-decrease in chest and perigastric lymphadenopathy, decrease thickening at the distal stomach, 11 mm sclerotic lesion in the posterior left third rib-barely visible on the PET/CT 07/12/2015  Cycle 6 FOLFOX 10/04/2015  Cycle 7 FOLFOX 10/18/2015  Cycle 8 FOLFOX 11/08/2015 (oxaliplatin held due to neuropathy)  Cycle 9 FOLFOX 11/22/2015 (oxaliplatin held secondary to neuropathy)  Cycle 10 FOLFOX 12/06/2015 (oxaliplatin held secondary to neuropathy)  CTs 12/24/2015-stable lung nodules and mediastinal lymph nodes, no evidence of disease progression  Upper endoscopy 08/13/2016 showed a large ulcerated circumferential mass  with no bleeding in the gastric antrum and at the pylorus causing partial outlet  obstruction.  Initiation of infusional 5-FU and radiation 08/21/2016;completed 09/10/2016  CTs06/18/2018-enlargement of a pleural-based right upper lobe nodule, other lung nodules are unchanged, no evidence of metastatic disease in the abdomen/pelvis  CT chest 12/12/2016-mild decrease in size of dominantpulmonary nodule medial right upper lobe. Other bilateral subcentimeter pulmonary nodulesshowed no significant change. New abdominal ascites.  Paracentesis 12/26/2016-cytology positive for adenocarcinoma  2. COPD  3. Nonischemic cardiomyopathy  4. Implanted cardiac defibrillator  5. History of prostate cancer  6. Hypertension  7. History of Iron deficiency anemia-hemoglobin lower03/20/2018  8. Neutropenia secondary to chemotherapy-Neulasta added with cycle 3 FOLFOX 08/23/2015  9. 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 Anemia secondary to blood loss related to #1. Blood transfusion 08/12/2016,08/16/2016.Hemoglobin improved 09/04/2016.Hemoglobin stable 09/18/2016.  12. 08/28/2016-hyperpigmentation with skin induration surrounding the Port-A-Cath, most likely a tape or skin cleanser reaction; 09/18/2016 the skin has healed; marked hyperpigmentation persists.  13.  Admission 01/18/2017 with nausea/vomiting and coffee-ground emesis-secondary to gastric outlet obstruction by tumor, status post placement of a gastroduodenal stent 01/20/2017 14. Respiratory distress following endoscopy 01/20/2017-chest x-ray with a new left lung infiltrate, aspiration pneumonia?  15. Elevated BUN/creatinine-more elevated 01/21/2017, dehydration?   [name]  underwent placement of a gastroduodenal stent by Dr. Henrene Pastor yesterday. He is now tolerating liquids. He appears stable for discharge from an oncology standpoint. He would like to have another therapeutic paracentesis prior to discharge. He is waiting to meet with the Hospice  team.  He may have aspiration pneumonia. He does not appear to be in respiratory distress this morning. The elevated beginning creatinine may be related to lack of oral intake over the past several days.  Recommendations: 1.  Diet per Dr. Henrene Pastor 2. [facility] hospice referral for home care 3. Palliative therapeutic paracentesis today 4. Antibiotics, management of pneumonia and renal insufficiency per the medical service 5. Outpatient follow-up will be arranged at the [facility] for 01/28/2017       LOS: 2 days   Donneta Romberg, MD   01/21/2017, 8:54 AM

## 2017-01-21 NOTE — Progress Notes (Addendum)
PROGRESS NOTE    [name]  BOF:[id number] DOB: 01/21/1935 DOA: 01/18/2017 PCP: Janith Lima, MD    Brief Narrative: [name] is a 82 y.o. male with history of gastric adenocarcinoma and lung mass had undergone chemotherapy and radiation therapy with history of prostate cancer in remission who has had paracentesis last week for ascites and since then patient has been having persistent vomiting.  ED Course: In the ER patient's hemoglobin appears to be at baseline. Creatinine has worsened from baseline. Patient is hemodynamically stable. Was started on IV Protonix.  Assessment & Plan:   1-Coffee ground emesis/gastric outlet obstruction -in setting of Metastatic gastric cancer -continue IV Protonix.  -s/p EGD and gastric stent placement 9/18 -Dr [name] following -plan for DC home with Hospice tomorrow, re-discussed code status-agrees to DNR  2-AKI; due to vomiting.  -holding ARB -creatinine up with soft BPs and vancomycin -clinically slightly vol overloaded, so hold IVF now -overall prognosis poor and plan for DC home with Hospice  3-Acute blood loss anemia -now stable Hb   4-Aspiration pneumonitis -continue Vanc and Zosyn today -change to Po Abx at Dc tomorrow  6. COPD; continue with theophylline.   7-Non ischemic Cardiomyopathy, S/P pacemaker.  Continue with coreg. Hold ARB.  EF 30 % -cut down IVF, hold diuretics  8. HTN; hydralazine PRN.   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  Disposition Plan: Home with Hospice tomorrow   Consultants:   GI  Oncology    Procedures:   none   Antimicrobials:  none   Subjective: No further vomiting, denies dyspnea. He is  doing ok.    Objective: Vitals:   01/21/17 1220 01/21/17 1225 01/21/17 1235 01/21/17 1300  BP: (!) 106/54 (!) 113/53 (!) 111/53 (!) 114/48  Pulse:    73  Resp:      Temp:    (!) 97.3 F (36.3 C)  TempSrc:    Oral  SpO2:    91%  Weight:      Height:        Intake/Output Summary (Last 24 hours) at 01/21/17 1516 Last data filed at 01/21/17 7782  Gross per 24 hour  Intake             1875 ml  Output              100 ml  Net             1775 ml   Filed Weights   01/18/17 2255 01/20/17 1224  Weight: 92 kg (202 lb 13.2 oz) 92.1 kg (203 lb)    Examination:  General exam: elderly chronically ill appearing male, no distress Respiratory system: diminished on Left Cardiovascular system: S1S2/RRR mildly tachycardic Gastrointestinal system: abd si soft, midly distended, non tender, BS present, fluid thrill present Central nervous system: non focal.  Extremities: Symmetric 5 x 5 power. Skin: No rashes, lesions or ulcers     Data Reviewed: I have personally reviewed following labs and imaging studies  CBC:  Recent Labs Lab 01/18/17 2328 01/19/17 0340 01/19/17 0821 01/20/17 0529 01/21/17 0109  WBC 6.5 5.8 5.3 5.3 10.2  HGB 10.8* 10.0* 10.2* 10.3* 10.2*  HCT 33.4* 31.1* 32.1* 32.3* 32.3*  MCV 82.9 80.8 80.9 82.2 83.7  PLT 247 220 222 230 914   Basic Metabolic Panel:  Recent Labs Lab 01/18/17 2000 01/19/17 0340 01/20/17 0529 01/21/17 0109  NA 138 140 141 139  K 5.1 5.1 5.0 5.2*  CL 99* 105 105 104  CO2 28 26 25 23   GLUCOSE 127* 106* 85 174*  BUN 29* 32* 40* 48*  CREATININE 1.76* 1.71* 1.92* 2.54*  CALCIUM 8.9 8.2* 8.3* 8.2*   GFR: Estimated Creatinine Clearance: 27.4 mL/min (A) (by C-G formula based on SCr of 2.54 mg/dL (H)). Liver Function Tests:  Recent Labs Lab 01/18/17 2000  AST 38  ALT 19  ALKPHOS 121  BILITOT 0.8  PROT 7.4  ALBUMIN 2.9*    Recent Labs Lab 01/18/17 2000  LIPASE 37   No results for input(s): AMMONIA in the last 168  hours. Coagulation Profile:  Recent Labs Lab 01/18/17 2000  INR 1.03   Cardiac Enzymes:  Recent Labs Lab 01/20/17 1919 01/21/17 0109 01/21/17 0850  TROPONINI <0.03 0.03* 0.03*   BNP (last 3 results) No results for input(s): PROBNP in the last 8760 hours. HbA1C: No results for input(s): HGBA1C in the last 72 hours. CBG:  Recent Labs Lab 01/20/17 0809 01/20/17 1241 01/20/17 1631 01/21/17 0009 01/21/17 0746  GLUCAP 79 82 103* 163* 159*   Lipid Profile: No results for input(s): CHOL, HDL, LDLCALC, TRIG, CHOLHDL, LDLDIRECT in the last 72 hours. Thyroid Function Tests: No results for input(s): TSH, T4TOTAL, FREET4, T3FREE, THYROIDAB in the last 72 hours. Anemia Panel: No results for input(s): VITAMINB12, FOLATE, FERRITIN, TIBC, IRON, RETICCTPCT in the last 72 hours. Sepsis Labs: No results for input(s): PROCALCITON, LATICACIDVEN in the last 168 hours.  No results found for this or any previous visit (from the past 240 hour(s)).       Radiology Studies: Dg Abd 1 View - Kub  Result Date: 01/20/2017 CLINICAL DATA:  Gastric outlet obstruction. EXAM: ABDOMEN COMPARISON:  KUB 01/19/2017. FINDINGS: Intraprocedural spot films are noted. Stent is noted placed over the upper abdomen. Clinical correlation suggested. Eight images obtained. 342 seconds fluoroscopy time. Radiation dose 204 scratched mGy. IMPRESSION: Intra procedures spot films as above. Electronically Signed   By: Marcello Moores  Register   On: 01/20/2017 14:23   Dg Chest Port 1 View  Result Date: 01/20/2017 CLINICAL DATA:  Hypoxemia. EXAM: PORTABLE CHEST 1 VIEW COMPARISON:  August 12, 2016 FINDINGS: New infiltrate in the left lung. The heart, hila, mediastinum, right Port-A-Cath, and pacemaker stable. No pneumothorax. IMPRESSION: New infiltrate in the left lung suspicious for pneumonia. Electronically Signed   By: Dorise Bullion III M.D   On: 01/20/2017 19:07   Dg C-arm 1-60 Min-no Report  Result Date:  01/20/2017 Fluoroscopy was utilized by the requesting physician.  No radiographic interpretation.        Scheduled Meds: . carvedilol  6.25 mg Oral BID WC  . lidocaine      . mometasone-formoterol  2 puff Inhalation BID  . [START ON 01/22/2017] pantoprazole  40 mg Intravenous Q12H  . theophylline  400 mg Oral Daily   Continuous Infusions: . pantoprozole (PROTONIX) infusion 8 mg/hr (01/21/17 1149)  . piperacillin-tazobactam (ZOSYN)  IV 3.375 g (01/21/17 1301)  . vancomycin Stopped (01/20/17 2329)     LOS: 2 days    Time spent: 35 minutes.     Domenic Polite, MD [facility] Pager [phone]  If 7PM-7AM, please contact night-coverage [email or web address] Password [email or web address] 01/21/2017, 3:16 PM

## 2017-01-21 NOTE — Progress Notes (Signed)
CRITICAL VALUE ALERT  Critical Value:  Troponin 0.03   Date & Time Notied:  9/19/182 @ -0215  Provider Notified: Maryjane Hurter   Orders Received/Actions taken: None.

## 2017-01-21 NOTE — Telephone Encounter (Signed)
Spoke with patient's son regarding the addition of his appt next week per 9/19 sch msg.

## 2017-01-22 LAB — CREATININE, SERUM
Creatinine, Ser: 2.14 mg/dL — ABNORMAL HIGH (ref 0.61–1.24)
GFR calc Af Amer: 32 mL/min — ABNORMAL LOW (ref >60)
GFR calc non Af Amer: 28 mL/min — ABNORMAL LOW (ref >60)

## 2017-01-22 LAB — GLUCOSE, CAPILLARY: Glucose-Capillary: 113 mg/dL — ABNORMAL HIGH (ref 65–99)

## 2017-01-22 MED ORDER — AMOXICILLIN-POT CLAVULANATE 875-125 MG PO TABS: 1.0000 | ORAL_TABLET | Freq: Two times a day (BID) | ORAL | 0 refills | Status: DC

## 2017-01-22 MED ORDER — PANTOPRAZOLE SODIUM 40 MG PO TBEC: 40.0000 mg | DELAYED_RELEASE_TABLET | Freq: Two times a day (BID) | ORAL | 0 refills | Status: AC

## 2017-01-22 NOTE — Care Management Important Message (Signed)
Important Message  Patient Details  Name: [name] MRN: [id number] Date of Birth: 1934/06/05   Medicare Important Message Given:  Yes    Kerin Salen 01/22/2017, 11:34 AMImportant Message  Patient Details  Name: [name] MRN: [id number] Date of Birth: [birth year]   Medicare Important Message Given:  Yes    Kerin Salen 01/22/2017, 11:34 AM

## 2017-01-22 NOTE — Progress Notes (Signed)
CM referral for hospice [place] on 01/21/17-Sons were provided w/home hospice provider list-they spoke to [facility], they also wanted to talk to [facility]. After their conversation w/both agencies independently they chose [facility].

## 2017-01-22 NOTE — Discharge Summary (Signed)
Physician Discharge Summary  [name] BTD:[id number] DOB: 01/06/1935 DOA: 01/18/2017  PCP: Janith Lima, MD  Admit date: 01/18/2017 Discharge date: 01/22/2017  Time spent: 35 minutes  Recommendations for Outpatient Follow-up:  Home with Hospice services Please evaluate and admit to Hospice if appropriate  Discharge Diagnoses:  Principal Problem:   Acute GI bleeding   Metastatic gastric cancer   Essential hypertension   COPD mixed type ([place])   Non-ischemic cardiomyopathy ([place])   Primary cancer of stomach with metastasis to other site [facility])   Acute kidney injury ([place])   Gastric outlet obstruction   Discharge Condition: stable  Diet recommendation: heart healthy  Filed Weights   01/18/17 2255 01/20/17 1224  Weight: 92 kg (202 lb 13.2 oz) 92.1 kg (203 lb)    History of present illness:  Joseph T Braswellis a 82 y.o.malewith history of gastric adenocarcinoma and lung mass had undergone chemotherapy and radiation therapy with history of prostate cancer in remission who has had paracentesis last week for ascites and since then patient has been having persistent vomiting.  Hospital Course:  1-Coffee ground emesis/gastric outlet obstruction -in setting of Metastatic gastric cancer -treated with  IV Protonix, GI consulted -s/p EGD and gastric stent placement 9/18 -Dr [name] following closely  -plan for DC home with Hospice today, re-discussed code status-agrees to DNR now  2-AKI; due to vomiting.  -stopped ARB -creatinine up with soft BPs and vancomycin -clinically slightly vol overloaded, so held IVF, creatinine slight improvement today -overall prognosis poor and plan for DC home with Hospice  3-Acute blood loss anemia -now stable Hb, didn't require transfusions  4-Aspiration pneumonitis -post EGD, s/p Rx with IV Vanc and Zosyn -change to Po Augmentin at discharge today for 80more days  6. COPD;  -stable, stopped theophylline.   7-Non ischemic  Cardiomyopathy, S/P pacemaker.  Continue with coreg. Hold ARB.  EF 30 % -need FU with Dr.Taylor-EP to have ICD turned off  8. HTN; hydralazine PRN.   9-Metastatic Adenocarcinoma of the stomach with lung mets, malignant ascites -and  history of prostate cancer  -overall poor prognosis, followed by Dr.Sherril, after much discussions, now plans for DC home with Hospice -now DNR, s/p therapeutic paracentesis of malignant ascites for comfort done yesterday 9/19   Consultations:  GI  Oncology    Discharge Exam: Vitals:   01/22/17 0522 01/22/17 0838  BP: (!) 106/59   Pulse: (!) 59   Resp: (!) 21   Temp: 99.2 F (37.3 C)   SpO2: 99% 92%    General: AAOx2 Cardiovascular: S1S2/RRR Respiratory: decreased on L base  Discharge Instructions   Discharge Instructions    Discharge instructions    Complete by:  As directed    Liquid/soft diet   Increase activity slowly    Complete by:  As directed      Discharge Medication List as of 01/22/2017  1:51 PM    START taking these medications   Details  amoxicillin-clavulanate (AUGMENTIN) 875-125 MG tablet Take 1 tablet by mouth 2 (two) times daily. For 5days, Starting Thu 01/22/2017, Print    pantoprazole (PROTONIX) 40 MG tablet Take 1 tablet (40 mg total) by mouth 2 (two) times daily before a meal., Starting Thu 01/22/2017, Print      CONTINUE these medications which have NOT CHANGED   Details  acetaminophen (TYLENOL) 500 MG tablet Take 1,000 mg by mouth daily as needed for fever or headache., Until Discontinued, Historical Med    atorvastatin (LIPITOR) 40 MG tablet TAKE  1 TABLET BY MOUTH DAILY, Normal    carvedilol (COREG) 6.25 MG tablet TAKE 1 TABLET(6.25 MG) BY MOUTH TWICE DAILY WITH A MEAL, Normal    lidocaine-prilocaine (EMLA) cream Apply 1 application topically as needed. Apply to [facility] 1 hour prior to stick and cover w/plastic wrap, Starting Wed 07/11/2015, Normal    omeprazole (PRILOSEC) 40 MG capsule Take 1 capsule  (40 mg total) by mouth daily., Starting Mon 02/18/2016, Normal    Polyethyl Glycol-Propyl Glycol (SYSTANE OP) Place 1 drop into both eyes daily. , Until Discontinued, Historical Med    prochlorperazine (COMPAZINE) 10 MG tablet Take 1 tablet (10 mg total) by mouth every 6 (six) hours as needed for nausea or vomiting., Starting Mon 12/29/2016, Normal      STOP taking these medications     amLODipine (NORVASC) 10 MG tablet      budesonide-formoterol (SYMBICORT) 160-4.5 MCG/ACT inhaler      Fe Cbn-Fe Gluc-FA-B12-C-DSS (FERRALET 90) 90-1 MG TABS      irbesartan (AVAPRO) 300 MG tablet      potassium chloride SA (K-DUR,KLOR-CON) 20 MEQ tablet      theophylline (UNIPHYL) 400 MG 24 hr tablet      HYDROcodone-homatropine (HYCODAN) 5-1.5 MG/5ML syrup        Allergies  Allergen Reactions  . Adhesive [Tape]     Reaction to sorbaview, opsite  . Other Rash    Patient allergic to Chlorehexidine    Follow-up Information    [facility].   Specialty:  Hospice Services Why:  Home nurse visit Contact information: [address] [place] [address] [phone]            The results of significant diagnostics from this hospitalization (including imaging, microbiology, ancillary and laboratory) are listed below for reference.    Significant Diagnostic Studies: Dg Abd 1 View - Kub  Result Date: 01/20/2017 CLINICAL DATA:  Gastric outlet obstruction. EXAM: ABDOMEN COMPARISON:  KUB 01/19/2017. FINDINGS: Intraprocedural spot films are noted. Stent is noted placed over the upper abdomen. Clinical correlation suggested. Eight images obtained. 342 seconds fluoroscopy time. Radiation dose 204 scratched mGy. IMPRESSION: Intra procedures spot films as above. Electronically Signed   By: Marcello Moores  Register   On: 01/20/2017 14:23   Dg Abd 1 View  Result Date: 01/19/2017 CLINICAL DATA:  Nausea and vomiting EXAM: ABDOMEN - 1 VIEW COMPARISON:  CT abdomen and pelvis  10/20/2016 FINDINGS: No large or small bowel distention. There is evidence of fold thickening and mid abdominal and left upper quadrant small bowel loops. This may indicate changes of enteritis. No radiopaque stones demonstrated. Vascular calcifications. Degenerative changes in the spine and hips. IMPRESSION: Nonobstructive bowel gas pattern. Fold thickening of small bowel loops may indicate enteritis. Electronically Signed   By: Lucienne Capers M.D.   On: 01/19/2017 01:31   US Paracentesis  Result Date: 01/22/2017 INDICATION: Gastric cancer with recurrent malignant ascites. Request made for therapeutic paracentesis. EXAM: ULTRASOUND GUIDED THERAPEUTIC PARACENTESIS MEDICATIONS: None. COMPLICATIONS: None immediate. PROCEDURE: Informed written consent was obtained from the patient after a discussion of the risks, benefits and alternatives to treatment. A timeout was performed prior to the initiation of the procedure. Initial ultrasound scanning demonstrates a moderate amount of ascites within the left lower abdominal quadrant. The left lower abdomen was prepped and draped in the usual sterile fashion. 1% lidocaine was used for local anesthesia. Following this, a Yueh catheter was introduced. An ultrasound image was saved for documentation purposes. The paracentesis was  performed. The catheter was removed and a dressing was applied. The patient tolerated the procedure well without immediate post procedural complication. FINDINGS: A total of approximately 3.1 liters of turbid, amber fluid was removed. IMPRESSION: Successful ultrasound-guided therapeutic paracentesis yielding 3.1 liters of peritoneal fluid. Read by: Rowe Robert, PA-C Electronically Signed   By: Lucrezia Europe M.D.   On: 01/21/2017 13:28   US Paracentesis  Result Date: 12/26/2016 INDICATION: Patient with history of gastric cancer, now with ascites. Request is made for diagnostic and therapeutic paracentesis. EXAM: ULTRASOUND GUIDED DIAGNOSTIC AND  THERAPEUTIC PARACENTESIS MEDICATIONS: 10 mL 1% lidocaine COMPLICATIONS: None immediate. PROCEDURE: Informed written consent was obtained from the patient after a discussion of the risks, benefits and alternatives to treatment. A timeout was performed prior to the initiation of the procedure. Initial ultrasound scanning demonstrates a moderate amount of ascites within the right lower abdominal quadrant. The right lateral abdomen was prepped and draped in the usual sterile fashion. 1% lidocaine with epinephrine was used for local anesthesia. Following this, a 19 gauge, 10-cm, Yueh catheter was introduced. An ultrasound image was saved for documentation purposes. The paracentesis was performed. The catheter was removed and a dressing was applied. The patient tolerated the procedure well without immediate post procedural complication. FINDINGS: A total of approximately 2.8 liters of amber fluid was removed. Samples were sent to the laboratory as requested by the clinical team. IMPRESSION: Successful ultrasound-guided diagnostic and therapeutic paracentesis yielding 2.8 liters of peritoneal fluid. Read by:  Brynda Greathouse PA-C Electronically Signed   By: Jerilynn Mages.  Shick M.D.   On: 12/26/2016 11:36   Dg Chest Port 1 View  Result Date: 01/20/2017 CLINICAL DATA:  Hypoxemia. EXAM: PORTABLE CHEST 1 VIEW COMPARISON:  August 12, 2016 FINDINGS: New infiltrate in the left lung. The heart, hila, mediastinum, right Port-A-Cath, and pacemaker stable. No pneumothorax. IMPRESSION: New infiltrate in the left lung suspicious for pneumonia. Electronically Signed   By: Dorise Bullion III M.D   On: 01/20/2017 19:07   Dg C-arm 1-60 Min-no Report  Result Date: 01/20/2017 Fluoroscopy was utilized by the requesting physician.  No radiographic interpretation.   Ir Paracentesis  Result Date: 01/12/2017 INDICATION: Patient with history of gastric cancer, now with ascites. Request is made for therapeutic paracentesis. EXAM: ULTRASOUND GUIDED  THERAPEUTIC PARACENTESIS MEDICATIONS: None. COMPLICATIONS: None immediate. PROCEDURE: Informed written consent was obtained from the patient after a discussion of the risks, benefits and alternatives to treatment. A timeout was performed prior to the initiation of the procedure. Initial ultrasound scanning demonstrates a large amount of ascites within the right lateral abdomen. The right lateral abdomen was prepped and draped in the usual sterile fashion. 1% lidocaine was used for local anesthesia. Following this, a 19 gauge, 7-cm, Yueh catheter was introduced. An ultrasound image was saved for documentation purposes. The paracentesis was performed. The catheter was removed and a dressing was applied. The patient tolerated the procedure well without immediate post procedural complication. FINDINGS: A total of approximately 3.3 liters of amber fluid was removed. IMPRESSION: Successful ultrasound-guided therapeutic paracentesis yielding 3.3 liters of peritoneal fluid. Read by:  Brynda Greathouse PA-C Electronically Signed   By: Jerilynn Mages.  Shick M.D.   On: 01/12/2017 10:57    Microbiology: No results found for this or any previous visit (from the past 240 hour(s)).   Labs: Basic Metabolic Panel:  Recent Labs Lab 01/18/17 2000 01/19/17 0340 01/20/17 0529 01/21/17 0109 01/22/17 0606  NA 138 140 141 139  --   K 5.1 5.1 5.0  5.2*  --   CL 99* 105 105 104  --   CO2 28 26 25 23   --   GLUCOSE 127* 106* 85 174*  --   BUN 29* 32* 40* 48*  --   CREATININE 1.76* 1.71* 1.92* 2.54* 2.14*  CALCIUM 8.9 8.2* 8.3* 8.2*  --    Liver Function Tests:  Recent Labs Lab 01/18/17 2000  AST 38  ALT 19  ALKPHOS 121  BILITOT 0.8  PROT 7.4  ALBUMIN 2.9*    Recent Labs Lab 01/18/17 2000  LIPASE 37   No results for input(s): AMMONIA in the last 168 hours. CBC:  Recent Labs Lab 01/18/17 2328 01/19/17 0340 01/19/17 0821 01/20/17 0529 01/21/17 0109  WBC 6.5 5.8 5.3 5.3 10.2  HGB 10.8* 10.0* 10.2* 10.3* 10.2*   HCT 33.4* 31.1* 32.1* 32.3* 32.3*  MCV 82.9 80.8 80.9 82.2 83.7  PLT 247 220 222 230 223   Cardiac Enzymes:  Recent Labs Lab 01/20/17 1919 01/21/17 0109 01/21/17 0850  TROPONINI <0.03 0.03* 0.03*   BNP: BNP (last 3 results) No results for input(s): BNP in the last 8760 hours.  ProBNP (last 3 results) No results for input(s): PROBNP in the last 8760 hours.  CBG:  Recent Labs Lab 01/21/17 0009 01/21/17 0746 01/21/17 1606 01/21/17 2340 01/22/17 0753  GLUCAP 163* 159* 137* 142* 113*       SignedDomenic Polite MD.  [facility] 01/22/2017, 2:54 PM

## 2017-01-28 ENCOUNTER — Ambulatory Visit (HOSPITAL_BASED_OUTPATIENT_CLINIC_OR_DEPARTMENT_OTHER): Payer: Medicare HMO | Admitting: Oncology

## 2017-01-28 ENCOUNTER — Telehealth: Payer: Self-pay | Admitting: Oncology

## 2017-01-28 ENCOUNTER — Other Ambulatory Visit: Payer: Medicare HMO

## 2017-01-28 ENCOUNTER — Other Ambulatory Visit: Payer: Self-pay

## 2017-01-28 VITALS — BP 133/75 | HR 75 | Temp 97.7°F | Resp 17 | Ht 74.0 in | Wt 204.7 lb

## 2017-01-28 DIAGNOSIS — C7972 Secondary malignant neoplasm of left adrenal gland: Secondary | ICD-10-CM

## 2017-01-28 DIAGNOSIS — D701 Agranulocytosis secondary to cancer chemotherapy: Secondary | ICD-10-CM | POA: Diagnosis not present

## 2017-01-28 DIAGNOSIS — C162 Malignant neoplasm of body of stomach: Secondary | ICD-10-CM | POA: Diagnosis not present

## 2017-01-28 DIAGNOSIS — Z452 Encounter for adjustment and management of vascular access device: Secondary | ICD-10-CM

## 2017-01-28 DIAGNOSIS — C169 Malignant neoplasm of stomach, unspecified: Secondary | ICD-10-CM

## 2017-01-28 DIAGNOSIS — Z95828 Presence of other vascular implants and grafts: Secondary | ICD-10-CM

## 2017-01-28 MED ORDER — HEPARIN SOD (PORK) LOCK FLUSH 100 UNIT/ML IV SOLN
500.0000 [IU] | Freq: Once | INTRAVENOUS | Status: AC | PRN
  Administered 2017-01-28: 500 [IU] via INTRAVENOUS
  Filled 2017-01-28: qty 5

## 2017-01-28 MED ORDER — SODIUM CHLORIDE 0.9 % IJ SOLN
10.0000 mL | INTRAMUSCULAR | Status: DC | PRN
  Administered 2017-01-28: 10 mL via INTRAVENOUS
  Filled 2017-01-28: qty 10

## 2017-01-28 MED ORDER — HYDROCODONE-ACETAMINOPHEN 5-325 MG PO TABS: 1.0000 | ORAL_TABLET | ORAL | 0 refills | Status: AC | PRN

## 2017-01-28 NOTE — Progress Notes (Signed)
[name]   Diagnosis: Gastric cancer  INTERVAL HISTORY:   [name] was discharged from the hospital 01/22/2017 after an admission with gastric outlet obstruction. He underwent placement of a gastric stent while in the hospital. He is tolerating liquids. He has occasional episodes of emesis. He reports intermittent abdominal pain. He underwent a therapeutic paracentesis for 3.1 L of fluid on 01/22/2017.  He is enrolled in home Hospice care. This is working out well.  He completed a course of antibiotics for probable aspiration pneumonia.  Objective:  Vital signs in last 24 hours:  Blood pressure 133/75, pulse 75, temperature 97.7 F (36.5 C), temperature source Oral, resp. rate 17, height 6\' 2"  (1.88 m), weight 204 lb 11.2 oz (92.9 kg), SpO2 100 %.    HEENT: No thrush Resp: Scattered inspiratory/expiratory rhonchi and wheezing. No respiratory distress Cardio: Regular rate and rhythm GI: Mildly distended, firm masslike fullness in the left mid and upper abdomen, mild tenderness Vascular: No leg edema   Portacath/PICC-without erythema   Medications: I have reviewed the patient's current medications.  Assessment/Plan: 1. Gastric Cancer  Lesser curvature gastric body mass noted on endoscopy 06/27/2015, biopsy confirmed adenocarcinoma  Staging CT scans to 06/28/2015 with a gastric body mass, adenopathy adjacent to the stomach, chest adenopathy, a single enhancing liver lesion, and indeterminate nodular pulmonary lesions  PET scan 07/12/2015-hypermetabolic gastric mass, gastrohepatic ligament metastases, metastasis at the left adrenal gland, hypermetabolic right paratracheal, right hilar, and subcarinal nodes moderate metabolic activity involving 2 pulmonary lesions  Cycle 1 FOLFOX 07/19/2015  Cycle 2 FOLFOX 08/02/2015  Cycle 3 FOLFOX 08/23/2015 with Neulasta support  Cycle 4 FOLFOX 09/06/2015 with Neulasta support  Cycle 5  FOLFOX 09/20/2015 with Neulasta support  Restaging CTs 10/02/2015-decrease in chest and perigastric lymphadenopathy, decrease thickening at the distal stomach, 11 mm sclerotic lesion in the posterior left third rib-barely visible on the PET/CT 07/12/2015  Cycle 6 FOLFOX 10/04/2015  Cycle 7 FOLFOX 10/18/2015  Cycle 8 FOLFOX 11/08/2015 (oxaliplatin held due to neuropathy)  Cycle 9 FOLFOX 11/22/2015 (oxaliplatin held secondary to neuropathy)  Cycle 10 FOLFOX 12/06/2015 (oxaliplatin held secondary to neuropathy)  CTs 12/24/2015-stable lung nodules and mediastinal lymph nodes, no evidence of disease progression  Upper endoscopy 08/13/2016 showed a large ulcerated circumferential mass with no bleeding in the gastric antrum and at the pylorus causing partial outlet obstruction.  Initiation of infusional 5-FU and radiation 08/21/2016;completed 09/10/2016  CTs06/18/2018-enlargement of a pleural-based right upper lobe nodule, other lung nodules are unchanged, no evidence of metastatic disease in the abdomen/pelvis  CT chest 12/12/2016-mild decrease in size of dominantpulmonary nodule medial right upper lobe. Other bilateral subcentimeter pulmonary nodulesshowed no significant change. New abdominal ascites.  Paracentesis 12/26/2016-cytology positive for adenocarcinoma  2. COPD  3. Nonischemic cardiomyopathy  4. Implanted cardiac defibrillator  5. History of prostate cancer  6. Hypertension  7. History of Iron deficiency anemia-hemoglobin lower03/20/2018  8. Neutropenia secondary to chemotherapy-Neulasta added with cycle 3 FOLFOX 08/23/2015  9. 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 Anemia secondary to blood loss related to #1. Blood transfusion 08/12/2016,08/16/2016.Hemoglobin improved 09/04/2016.Hemoglobin stable 09/18/2016.  12. 08/28/2016-hyperpigmentation with skin induration surrounding the Port-A-Cath, most  likely a tape or skin cleanser reaction; 09/18/2016 the skin has healed; marked hyperpigmentation persists.  13.  Admission 01/18/2017 with nausea/vomiting and coffee-ground emesis-secondary to gastric outlet obstruction by tumor, status post placement of a gastroduodenal stent 01/20/2017 14. Respiratory distress following endoscopy 01/20/2017-chest x-ray with a new  left lung infiltrate, aspiration pneumonia? Completed outpatient course of Augmentin    Disposition:  [name] has metastatic gastric cancer. He is enrolled in a home Hospice program. He will continue Compazine as needed for nausea. He will try hydrocodone for pain. He will discontinue Lipitor and omeprazole.  [name] will contact us when he feels that he needs another therapeutic paracentesis. He will return for an office visit in 3 weeks.  15 minutes were spent with the patient today. The majority of the time was used for counseling and coordination of care.  Donneta Romberg, MD  01/28/2017  10:03 AM

## 2017-01-28 NOTE — Telephone Encounter (Signed)
Scheduled appt per 9/26 los - Gave patient AVS and calender per los.

## 2017-02-01 ENCOUNTER — Encounter (HOSPITAL_COMMUNITY): Payer: Self-pay

## 2017-02-01 DIAGNOSIS — Z5321 Procedure and treatment not carried out due to patient leaving prior to being seen by health care provider: Secondary | ICD-10-CM | POA: Insufficient documentation

## 2017-02-01 DIAGNOSIS — C169 Malignant neoplasm of stomach, unspecified: Secondary | ICD-10-CM | POA: Insufficient documentation

## 2017-02-01 DIAGNOSIS — R188 Other ascites: Secondary | ICD-10-CM | POA: Diagnosis not present

## 2017-02-01 LAB — CBC
HCT: 34.8 % — ABNORMAL LOW (ref 39.0–52.0)
Hemoglobin: 11.5 g/dL — ABNORMAL LOW (ref 13.0–17.0)
MCH: 26.4 pg (ref 26.0–34.0)
MCHC: 33 g/dL (ref 30.0–36.0)
MCV: 80 fL (ref 78.0–100.0)
PLATELETS: 318 10*3/uL (ref 150–400)
RBC: 4.35 MIL/uL (ref 4.22–5.81)
RDW: 16.7 % — ABNORMAL HIGH (ref 11.5–15.5)
WBC: 7.6 10*3/uL (ref 4.0–10.5)

## 2017-02-01 LAB — COMPREHENSIVE METABOLIC PANEL
ALBUMIN: 2.7 g/dL — AB (ref 3.5–5.0)
ALK PHOS: 132 U/L — AB (ref 38–126)
ALT: 17 U/L (ref 17–63)
AST: 41 U/L (ref 15–41)
Anion gap: 9 (ref 5–15)
BUN: 33 mg/dL — ABNORMAL HIGH (ref 6–20)
CALCIUM: 8.7 mg/dL — AB (ref 8.9–10.3)
CO2: 25 mmol/L (ref 22–32)
CREATININE: 2.01 mg/dL — AB (ref 0.61–1.24)
Chloride: 103 mmol/L (ref 101–111)
GFR calc Af Amer: 35 mL/min — ABNORMAL LOW (ref >60)
GFR calc non Af Amer: 30 mL/min — ABNORMAL LOW (ref >60)
GLUCOSE: 127 mg/dL — AB (ref 65–99)
Potassium: 5 mmol/L (ref 3.5–5.1)
SODIUM: 137 mmol/L (ref 135–145)
Total Bilirubin: 0.5 mg/dL (ref 0.3–1.2)
Total Protein: 7 g/dL (ref 6.5–8.1)

## 2017-02-01 LAB — LIPASE, BLOOD: Lipase: 48 U/L (ref 11–51)

## 2017-02-01 NOTE — ED Triage Notes (Addendum)
Pt BIB family c/o inability to keep anything down and abdominal tightness. Pt reports hx of getting fluid drained from abdomen, last drainage was about 11 days ago and is experiencing the same sx as when he needed it drained in the past. Pt hasn't taken meds today.

## 2017-02-02 ENCOUNTER — Telehealth: Payer: Self-pay | Admitting: *Deleted

## 2017-02-02 ENCOUNTER — Ambulatory Visit: Payer: Medicare HMO | Admitting: Oncology

## 2017-02-02 ENCOUNTER — Ambulatory Visit (HOSPITAL_COMMUNITY)
Admission: RE | Admit: 2017-02-02 | Discharge: 2017-02-02 | Disposition: A | Discharge status: Home / Self Care | Payer: Medicare Other | Source: Ambulatory Visit | Attending: Oncology | Admitting: Oncology

## 2017-02-02 ENCOUNTER — Emergency Department (HOSPITAL_COMMUNITY)
Admission: EM | Admit: 2017-02-02 | Discharge: 2017-02-02 | Discharge status: Against Medical Advice | Payer: Medicare Other | Source: Home / Self Care

## 2017-02-02 DIAGNOSIS — C169 Malignant neoplasm of stomach, unspecified: Secondary | ICD-10-CM

## 2017-02-02 DIAGNOSIS — R180 Malignant ascites: Secondary | ICD-10-CM | POA: Diagnosis not present

## 2017-02-02 MED ORDER — LIDOCAINE HCL 2 % IJ SOLN
INTRAMUSCULAR | Status: AC
  Filled 2017-02-02: qty 10

## 2017-02-02 NOTE — Procedures (Signed)
Ultrasound-guided  therapeutic paracentesis performed yielding 4 liters of hazy, amber fluid. No immediate complications.

## 2017-02-02 NOTE — ED Notes (Signed)
Pt's wife said they couldn't wait any longer, and would be going to see their PCP in the morning.

## 2017-02-02 NOTE — ED Notes (Signed)
Pt LWBS d/t wait time.

## 2017-02-02 NOTE — Telephone Encounter (Signed)
Message from pt requesting paracentesis today. Discussed with Dr. [name]: Order received. 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 Instructed him to come in now to be worked in. He voiced understanding.

## 2017-02-03 ENCOUNTER — Encounter: Payer: Medicare HMO | Admitting: Internal Medicine

## 2017-02-05 ENCOUNTER — Other Ambulatory Visit: Payer: Self-pay | Admitting: Oncology

## 2017-02-05 ENCOUNTER — Other Ambulatory Visit: Payer: Self-pay | Admitting: *Deleted

## 2017-02-05 DIAGNOSIS — C169 Malignant neoplasm of stomach, unspecified: Secondary | ICD-10-CM

## 2017-02-05 DIAGNOSIS — C160 Malignant neoplasm of cardia: Secondary | ICD-10-CM

## 2017-02-05 MED ORDER — PROCHLORPERAZINE MALEATE 10 MG PO TABS: 10.0000 mg | ORAL_TABLET | Freq: Four times a day (QID) | ORAL | 1 refills | Status: DC | PRN

## 2017-02-09 ENCOUNTER — Other Ambulatory Visit: Payer: Self-pay | Admitting: Oncology

## 2017-02-09 ENCOUNTER — Other Ambulatory Visit: Payer: Self-pay

## 2017-02-09 ENCOUNTER — Telehealth: Payer: Self-pay

## 2017-02-09 ENCOUNTER — Ambulatory Visit (HOSPITAL_COMMUNITY)
Admission: RE | Admit: 2017-02-09 | Discharge: 2017-02-09 | Disposition: A | Discharge status: Home / Self Care | Source: Ambulatory Visit | Attending: Oncology | Admitting: Oncology

## 2017-02-09 DIAGNOSIS — C169 Malignant neoplasm of stomach, unspecified: Secondary | ICD-10-CM

## 2017-02-09 DIAGNOSIS — R180 Malignant ascites: Secondary | ICD-10-CM | POA: Insufficient documentation

## 2017-02-09 DIAGNOSIS — R188 Other ascites: Secondary | ICD-10-CM | POA: Diagnosis not present

## 2017-02-09 MED ORDER — LIDOCAINE HCL 2 % IJ SOLN
INTRAMUSCULAR | Status: AC
  Filled 2017-02-09: qty 10

## 2017-02-09 NOTE — Procedures (Signed)
Ultrasound-guided therapeutic paracentesis performed yielding 2.8 liters of bloody colored fluid. No immediate complications.  Shanette Tamargo E 3:20 PM 02/09/2017

## 2017-02-09 NOTE — Telephone Encounter (Signed)
Gerald Stabs, pt's son, notified that a standing order has been put in for a paracentesis as requested, per MD [name].

## 2017-02-10 ENCOUNTER — Telehealth: Payer: Self-pay | Admitting: *Deleted

## 2017-02-10 ENCOUNTER — Other Ambulatory Visit: Payer: Self-pay | Admitting: *Deleted

## 2017-02-10 DIAGNOSIS — C169 Malignant neoplasm of stomach, unspecified: Secondary | ICD-10-CM

## 2017-02-10 NOTE — Telephone Encounter (Signed)
Refer to [facility]

## 2017-02-10 NOTE — Telephone Encounter (Signed)
Dr. [name] thinks pt is more appropriate for full hospice. Pt wishes to continue paracentesis for comfort.  Referral faxed to [facility].

## 2017-02-10 NOTE — Telephone Encounter (Signed)
Message from [place], hospice RN requesting orders for palliative care instead of hospice. Pt and family would like to continue paracentesis and emergency care PRN. Hospice nurse thinks palliative care would be a good bridge for him. Will review with MD.

## 2017-02-12 ENCOUNTER — Telehealth: Payer: Self-pay | Admitting: *Deleted

## 2017-02-12 NOTE — Telephone Encounter (Signed)
Received call from [name], [place] with [facility]. They are unable to admit pt until [facility] discharges him or pt revokes care. [name] reports pt's sons were not aware of the change. Called pt's son [name], explained to him the change was per request of [facility] nurse due to pt's ongoing need for paracentesis. He reports referral to palliative was discussed earlier this week. [name] or his brother will need to schedule a time to be present for this new admission to [facility].  Called hospice admissions, informed Jenn that I have spoken to pt's son. They will contact him (not pt's wife) to schedule appt.

## 2017-02-13 ENCOUNTER — Ambulatory Visit (HOSPITAL_COMMUNITY)
Admission: RE | Admit: 2017-02-13 | Discharge: 2017-02-13 | Disposition: A | Discharge status: Home / Self Care | Payer: Medicare Other | Source: Ambulatory Visit | Attending: Oncology | Admitting: Oncology

## 2017-02-13 ENCOUNTER — Telehealth: Payer: Self-pay | Admitting: Emergency Medicine

## 2017-02-13 DIAGNOSIS — R188 Other ascites: Secondary | ICD-10-CM | POA: Diagnosis not present

## 2017-02-13 DIAGNOSIS — Z85028 Personal history of other malignant neoplasm of stomach: Secondary | ICD-10-CM | POA: Diagnosis not present

## 2017-02-13 DIAGNOSIS — C169 Malignant neoplasm of stomach, unspecified: Secondary | ICD-10-CM | POA: Diagnosis not present

## 2017-02-13 DIAGNOSIS — R180 Malignant ascites: Secondary | ICD-10-CM | POA: Diagnosis not present

## 2017-02-13 MED ORDER — LIDOCAINE HCL 2 % IJ SOLN
INTRAMUSCULAR | Status: AC
  Filled 2017-02-13: qty 10

## 2017-02-13 NOTE — Telephone Encounter (Signed)
This nurse called [facility] to follow up on care for patient. Jenn from [facility] states that their is a nurse ready for this patient, they are just awaiting d/c notice from [facility] [place]. Son, Juanda Crumble notified of this. Juanda Crumble states that the social worker Raquel Sarna for [employer] is going to send over d/c paperwork to [place] at [employer]. Instructed son to call us back with any issues that we could assist him with.

## 2017-02-13 NOTE — Telephone Encounter (Signed)
Patients son Juanda Crumble called this nurse to inform Dr.Sherrill that [facility] will be coming on today to see Mr.Huneke. He thanked [place] for getting his father hospice care in a timely manor. This nurse instructed him to call back for any future issues or concerns.

## 2017-02-13 NOTE — Procedures (Signed)
Ultrasound-guided  therapeutic paracentesis performed yielding 1 liter of blood-tinged fluid. No immediate complications.

## 2017-02-16 ENCOUNTER — Telehealth: Payer: Self-pay | Admitting: *Deleted

## 2017-02-16 ENCOUNTER — Telehealth: Payer: Self-pay | Admitting: Internal Medicine

## 2017-02-16 NOTE — Telephone Encounter (Signed)
Returned call to pt's son, he reports pt has declined significantly since last week. The sons are providing around the clock care. Pt is no longer eating, drinking or getting out of bed. Doing very little talking. Family is interested in admission to [facility]. Social worker has been by today and admission to [facility] was discussed, there may be an opening later this week. Hospice RN is scheduled to come out again this afternoon.  Will make MD aware.

## 2017-02-16 NOTE — Telephone Encounter (Signed)
Spoke with patient's son who had questions about how his father's home monitor worked in relation to his ICD. I explained the function of his home monitor. He additionally asked how they would go about getting his fathers ICD therapies turned off as he is in hospice and a DNR. I explained that hospice would fax [place] an order in which we could then send out industry to deactivate therapies. Patient's son verbalized understanding.

## 2017-02-16 NOTE — Telephone Encounter (Signed)
New message    Pt son  Juanda Crumble is calling to inform staff patient is now under hospice care. The son is wanting ensure his father is not shocked by device during this transition period.

## 2017-02-17 ENCOUNTER — Encounter: Payer: Medicare HMO | Admitting: Internal Medicine

## 2017-02-17 ENCOUNTER — Telehealth: Payer: Self-pay | Admitting: Cardiology

## 2017-02-17 ENCOUNTER — Ambulatory Visit: Payer: Medicare HMO | Admitting: Nurse Practitioner

## 2017-02-17 NOTE — Telephone Encounter (Signed)
Called hospice facility to get order to deactivate ICD therapies. Receptionist will have RN to return my call.

## 2017-02-17 NOTE — Telephone Encounter (Signed)
Informed hospice RN, Opal Sidles, that order has not been received. Opal Sidles said that the hospice MD has tried to send the order x 2.   I spoke with Dionne Milo, ST[facility] who states that as long as the hospice MD signs the order requesting that the ICD therapies are deactivated then she can proceed without an EP signature. I called Opal Sidles and informed her of the information provided by [place]. She states that she will contact Dionne Milo about how to get the order to her.

## 2017-02-17 NOTE — Telephone Encounter (Signed)
Nurse with hospice called to get instructions on how to turn off patient ICD therapies. Gave her the instructions on how to do this.  Called patient son and got hospice information to call back and check on status of the order to turn off ICD.

## 2017-02-23 ENCOUNTER — Ambulatory Visit: Payer: Medicare HMO | Admitting: Internal Medicine

## 2017-02-24 ENCOUNTER — Telehealth: Payer: Self-pay | Admitting: Oncology

## 2017-02-24 NOTE — Telephone Encounter (Signed)
FAXED RECORDS TO [facility]

## 2017-03-05 DEATH — deceased

## 2017-04-08 ENCOUNTER — Other Ambulatory Visit: Payer: Self-pay | Admitting: Nurse Practitioner

## 2017-09-03 IMAGING — DX DG CHEST 1V PORT
1 series · 1 of 1 positions shown · non-contrast
Comparison: None.

CLINICAL DATA: Status post port placement

EXAM:
PORTABLE CHEST 1 VIEW

[chest ap]
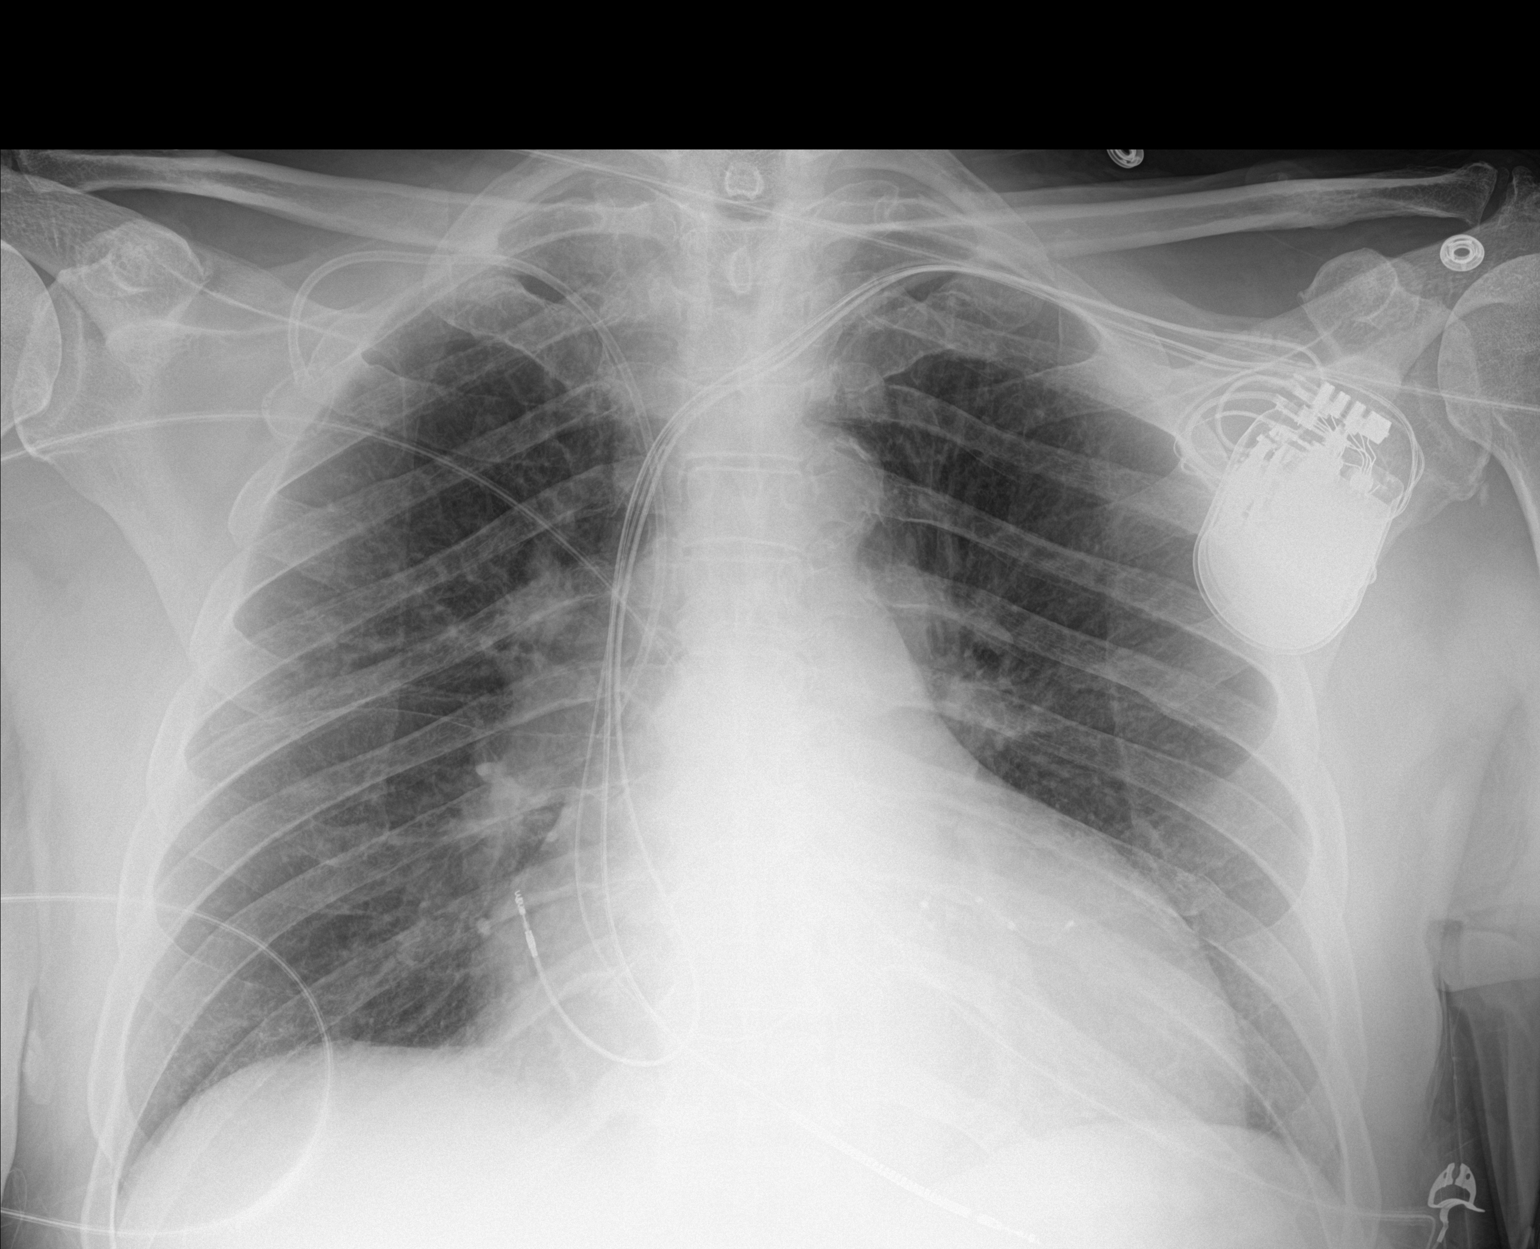

[1 of 1 positions shown; findings below may reference images not displayed]

FINDINGS: Cardiac shadow is enlarged. A pacing device is noted. The lungs are
well aerated bilaterally. A new right chest wall port is seen in
satisfactory position. No pneumothorax is noted. No bony abnormality
is seen.
IMPRESSION: No pneumothorax following chest port placement

## 2019-03-08 IMAGING — US IR PARACENTESIS
1 series · 2 of 2 positions shown · non-contrast
Comparison: none

INDICATION: Patient with history of gastric cancer, now with ascites. Request is
made for therapeutic paracentesis.

[Series 1: ir (id) (id)/(id)/(id) ir · 2 of 2 slices shown]
[im 1/2]
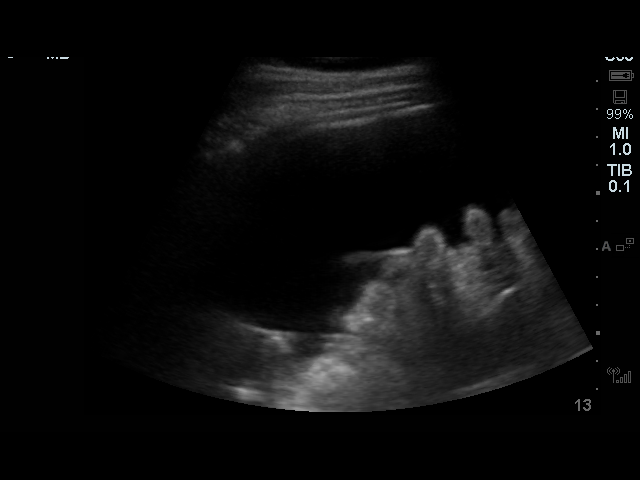
[im 2/2]
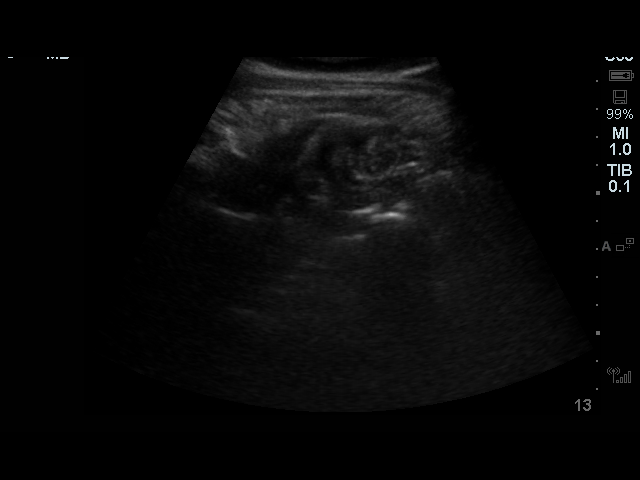

[2 of 2 positions shown; findings below may reference images not displayed]

EXAM:
ULTRASOUND GUIDED THERAPEUTIC PARACENTESIS

MEDICATIONS:
None.

COMPLICATIONS:
None immediate.

PROCEDURE:
Informed written consent was obtained from the patient after a
discussion of the risks, benefits and alternatives to treatment. A
timeout was performed prior to the initiation of the procedure.

Initial ultrasound scanning demonstrates a large amount of ascites
within the right lateral abdomen. The right lateral abdomen was
prepped and draped in the usual sterile fashion. 1% lidocaine was
used for local anesthesia.

Following this, a 19 gauge, 7-cm, Yueh catheter was introduced. An
ultrasound image was saved for documentation purposes. The
paracentesis was performed. The catheter was removed and a dressing
was applied. The patient tolerated the procedure well without
immediate post procedural complication.
FINDINGS: A total of approximately 3.3 liters of amber fluid was removed.
IMPRESSION: Successful ultrasound-guided therapeutic paracentesis yielding
liters of peritoneal fluid.

## 2019-03-15 IMAGING — DX DG ABDOMEN 1V
1 series · 1 of 1 positions shown · non-contrast
Comparison: CT abdomen and pelvis 10/20/2016

CLINICAL DATA: Nausea and vomiting

EXAM:
ABDOMEN - 1 VIEW

[abdomen kub]
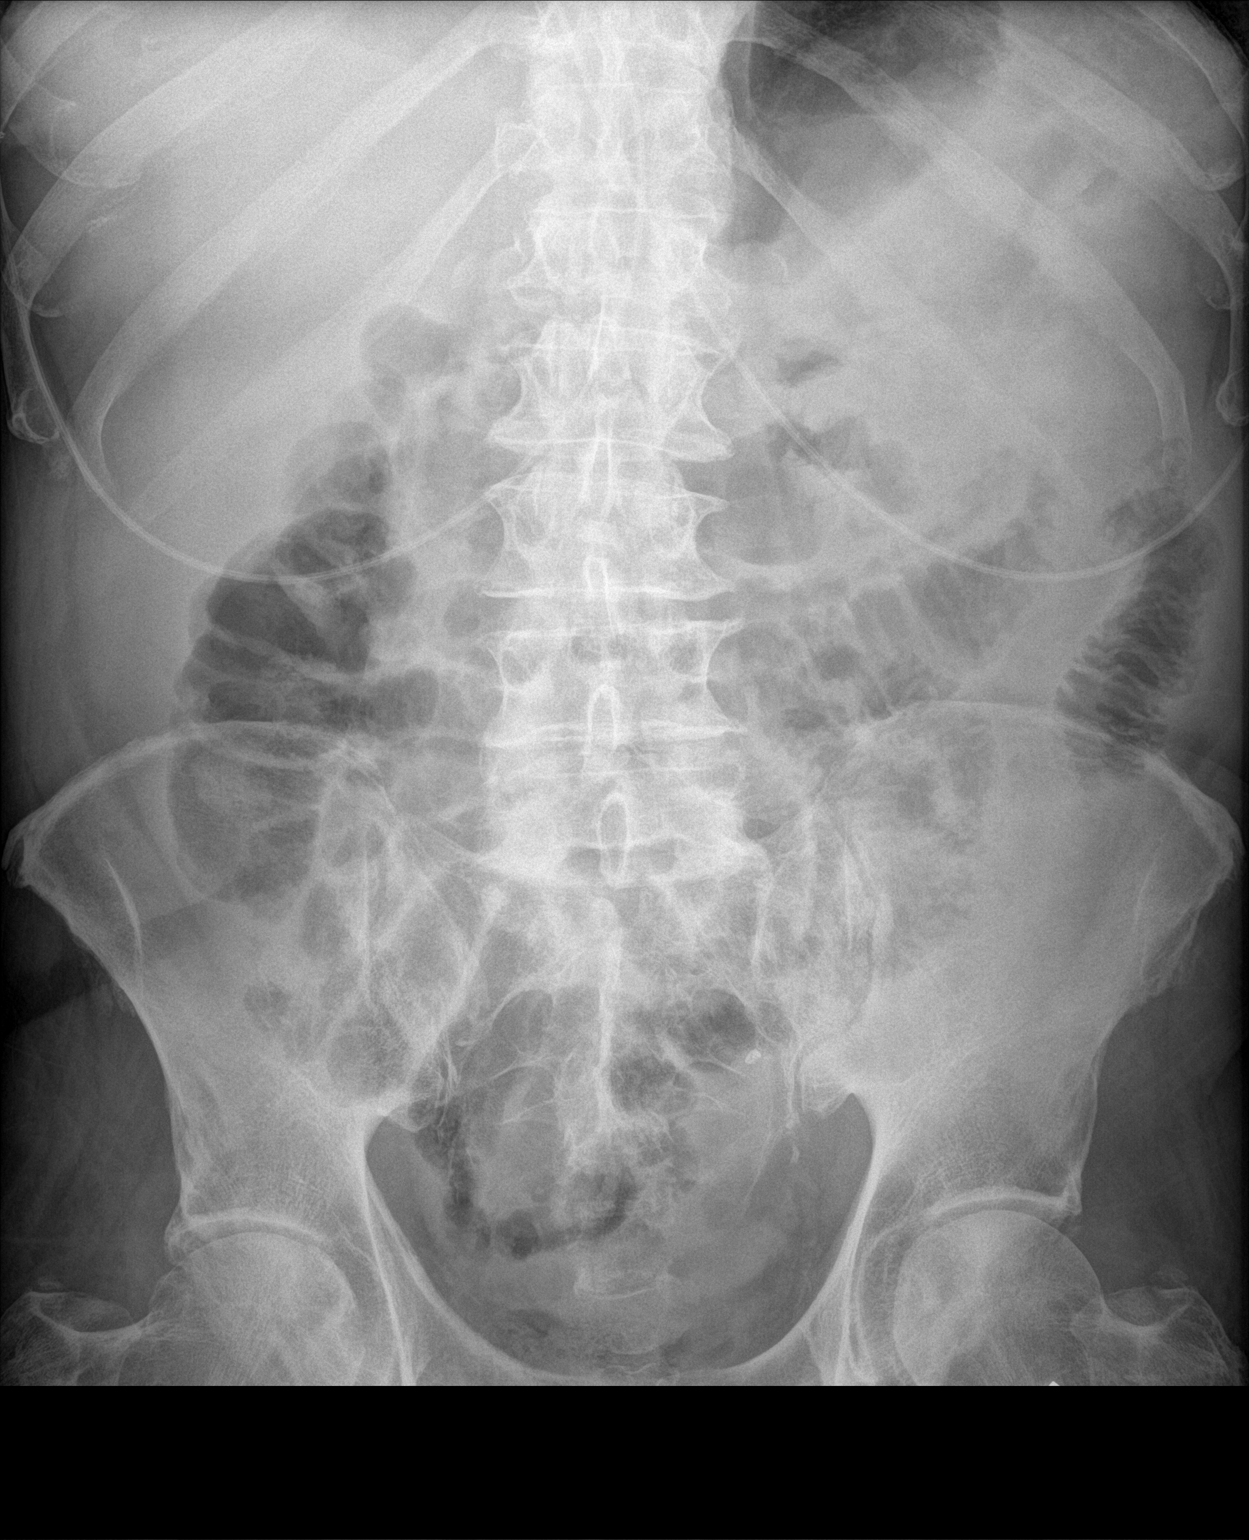

[1 of 1 positions shown; findings below may reference images not displayed]

FINDINGS: No large or small bowel distention. There is evidence of fold
thickening and mid abdominal and left upper quadrant small bowel
loops. This may indicate changes of enteritis. No radiopaque stones
demonstrated. Vascular calcifications. Degenerative changes in the
spine and hips.
IMPRESSION: Nonobstructive bowel gas pattern. Fold thickening of small bowel
loops may indicate enteritis.

## 2019-03-16 IMAGING — RF DG ABDOMEN 1V
1 series · 8 of 8 positions shown · non-contrast
Comparison: KUB 01/19/2017.

CLINICAL DATA: Gastric outlet obstruction.

EXAM:
ABDOMEN

[Series 1: run · 8 of 8 slices shown]
[im 1/8]
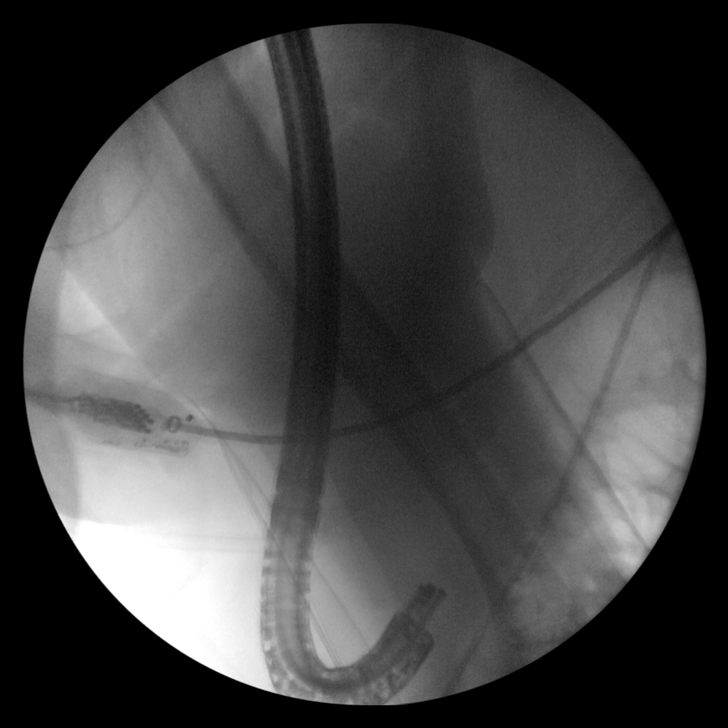
[im 2/8]
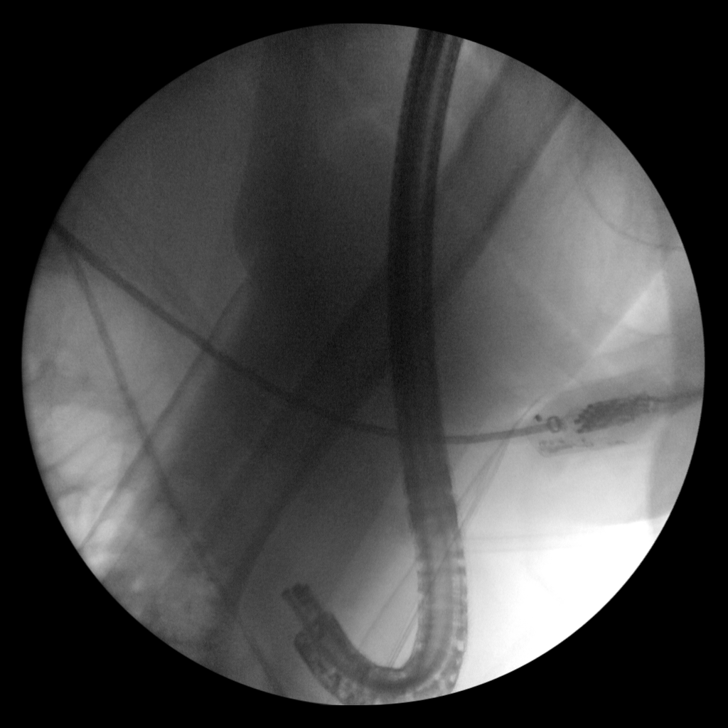
[im 3/8]
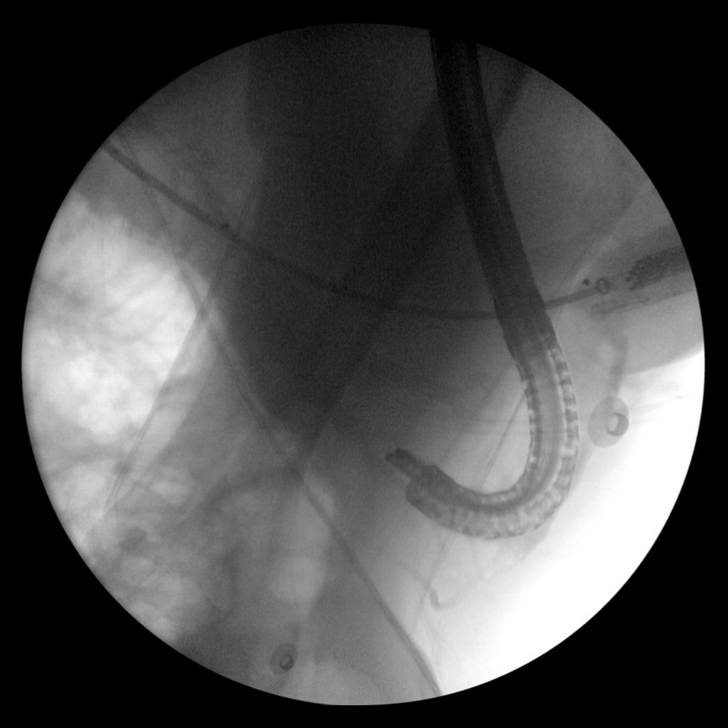
[im 4/8]
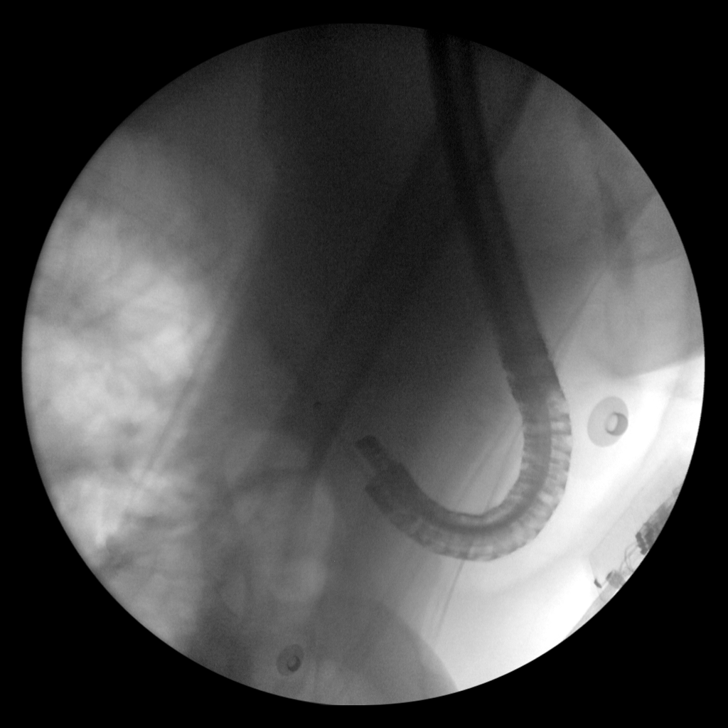
[im 5/8]
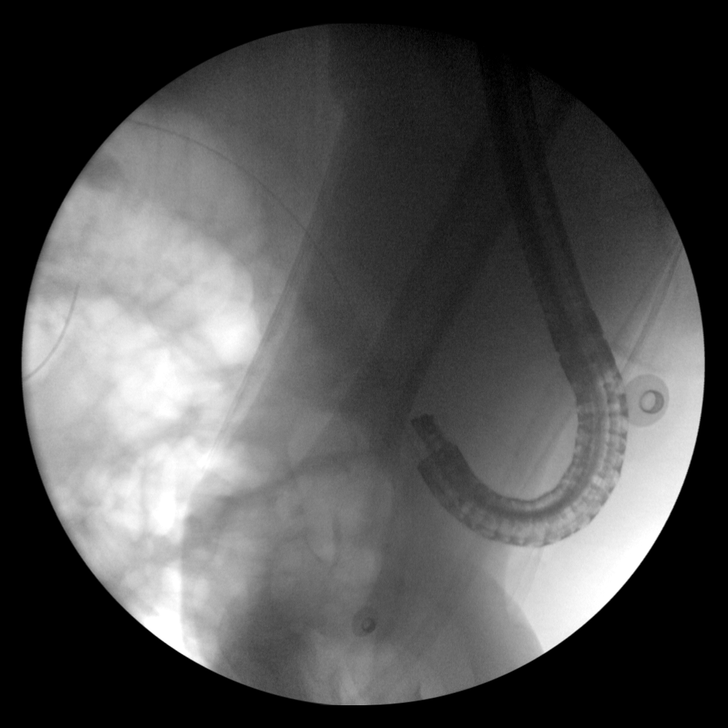
[im 6/8]
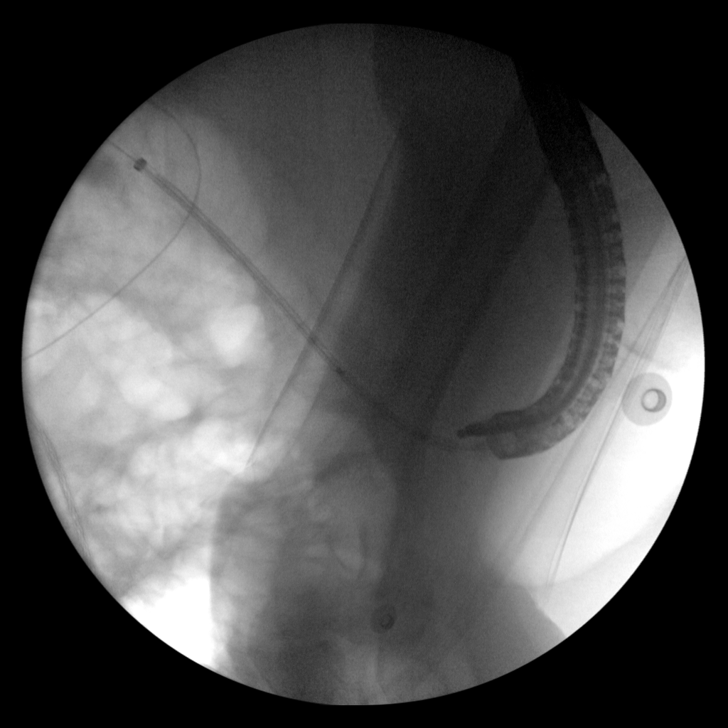
[im 7/8]
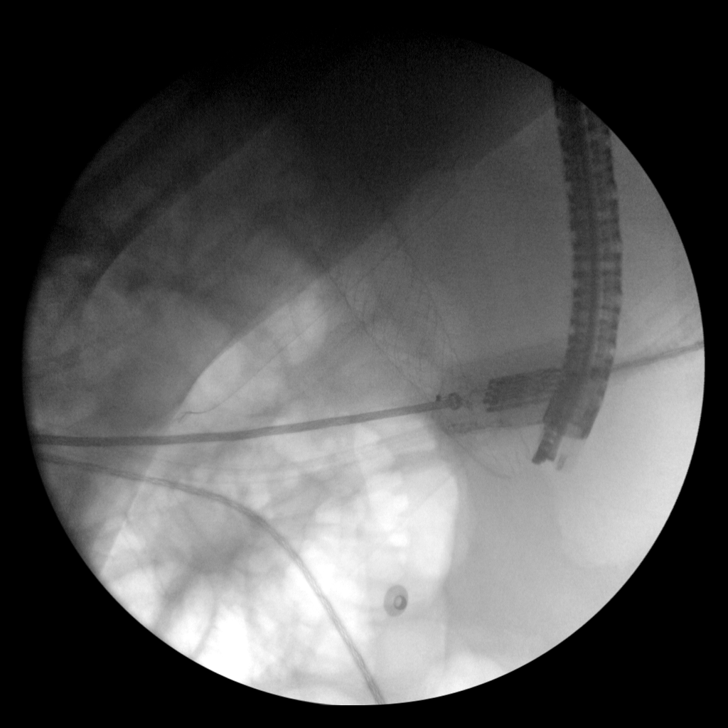
[im 8/8]
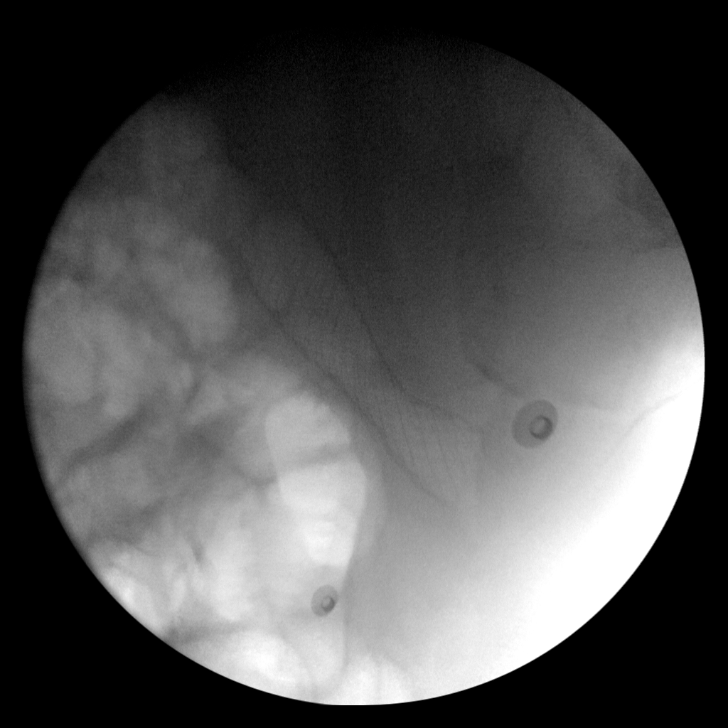

[8 of 8 positions shown; findings below may reference images not displayed]

FINDINGS: Intraprocedural spot films are noted. Stent is noted placed over the
upper abdomen. Clinical correlation suggested. Eight images
obtained. 342 seconds fluoroscopy time. Radiation dose 204 scratched
mGy.
IMPRESSION: Intra procedures spot films as above.

## 2019-03-17 IMAGING — US US PARACENTESIS
1 series · 6 of 6 positions shown · non-contrast
Comparison: none

INDICATION: Gastric cancer with recurrent malignant ascites. Request made for
therapeutic paracentesis.

[Series 1: us paracentesis · 0.26mm/px · 6 of 6 slices shown]
[im 1/6]
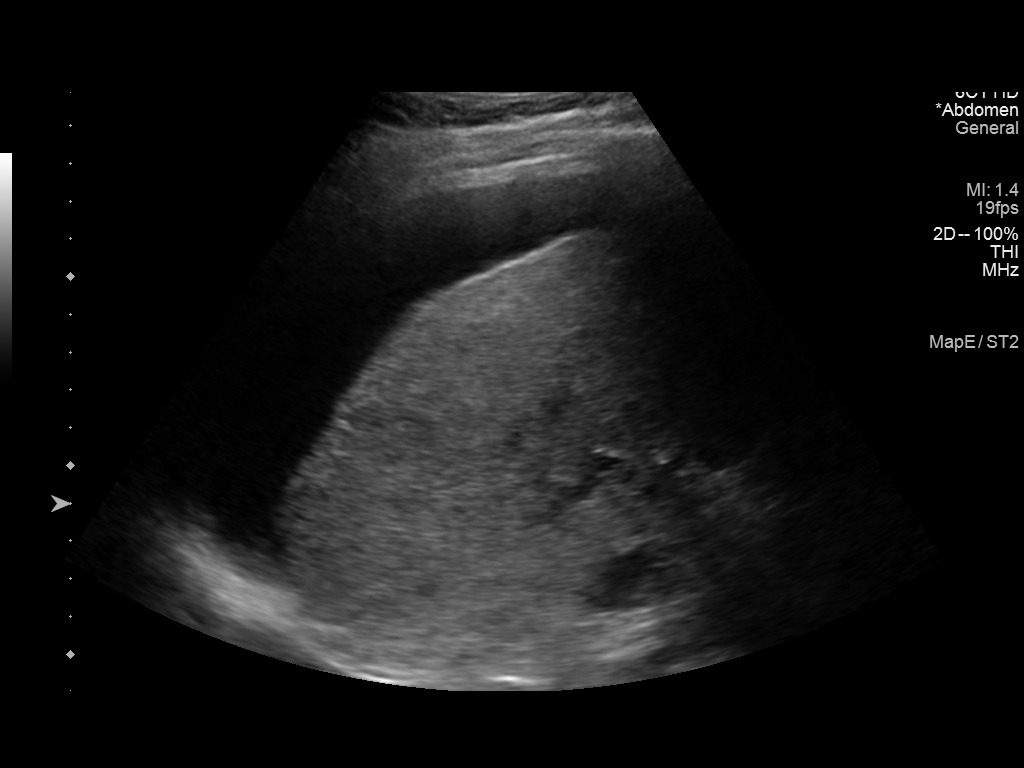
[im 2/6]
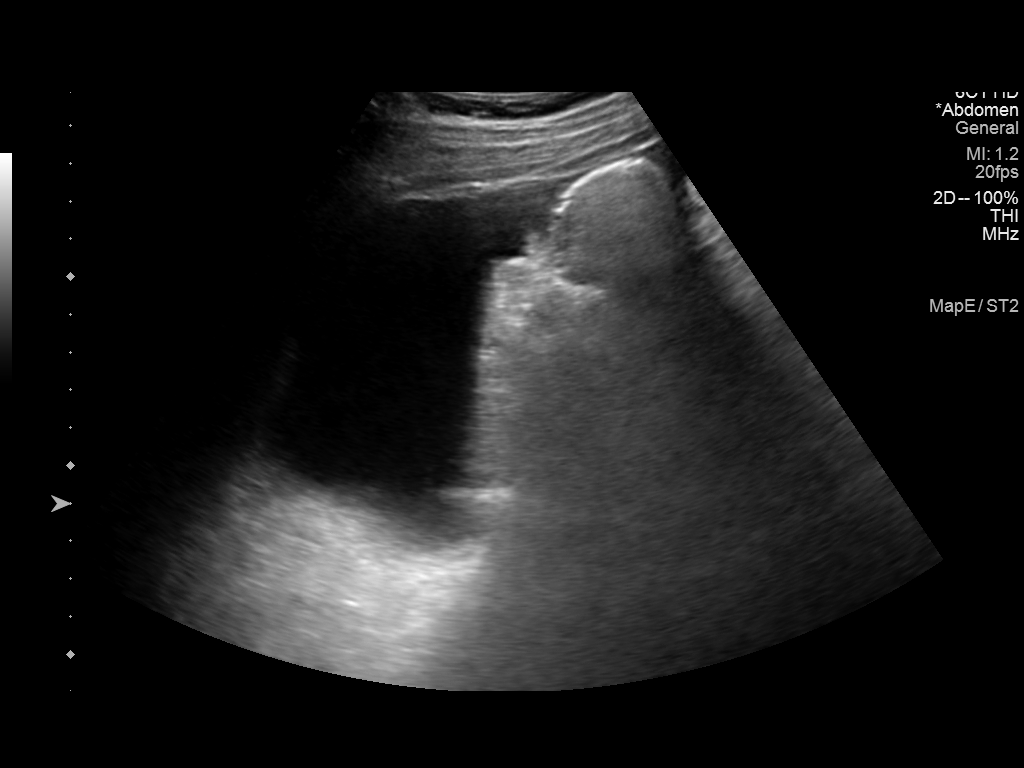
[im 3/6]
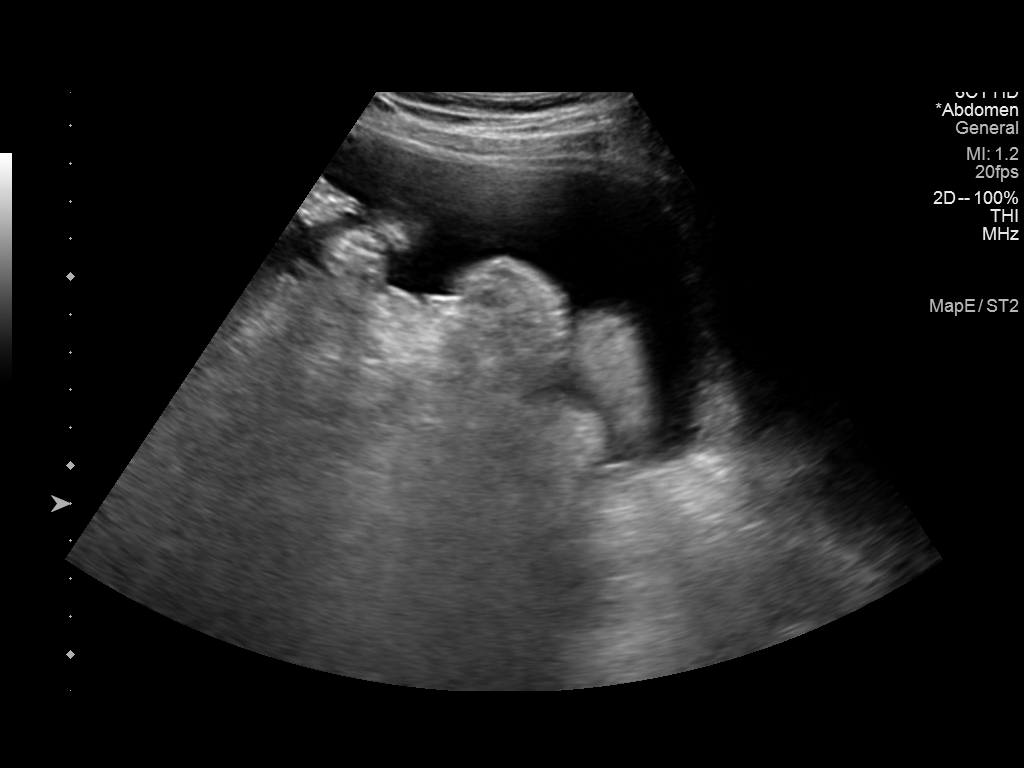
[im 4/6]
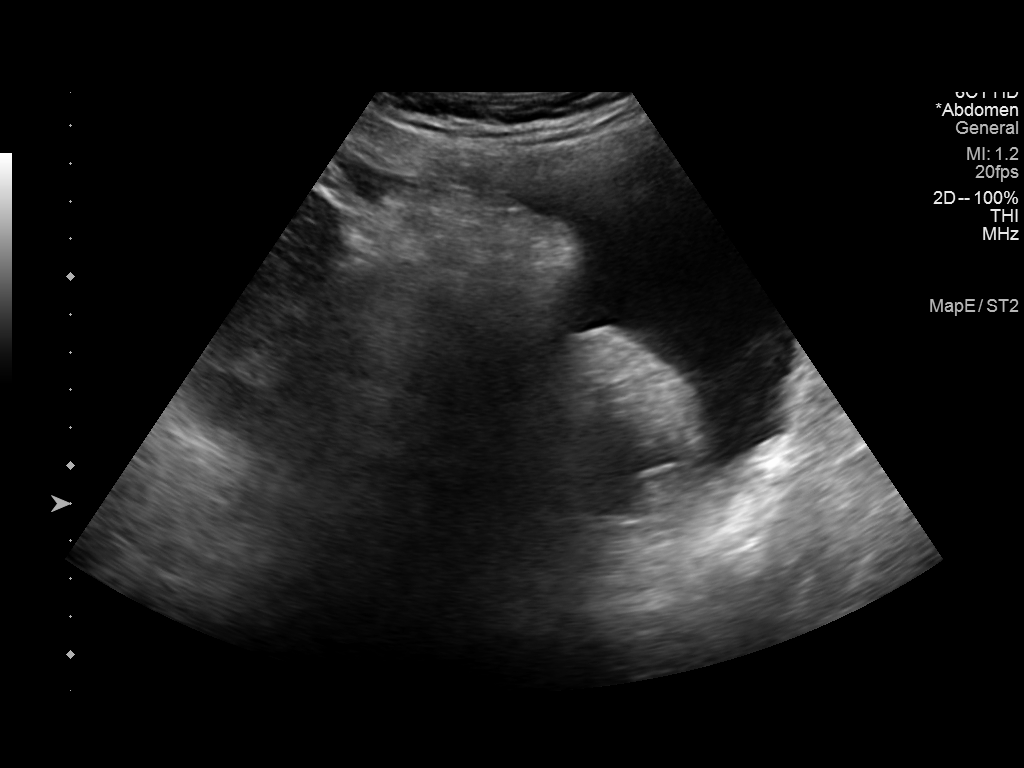
[im 5/6]
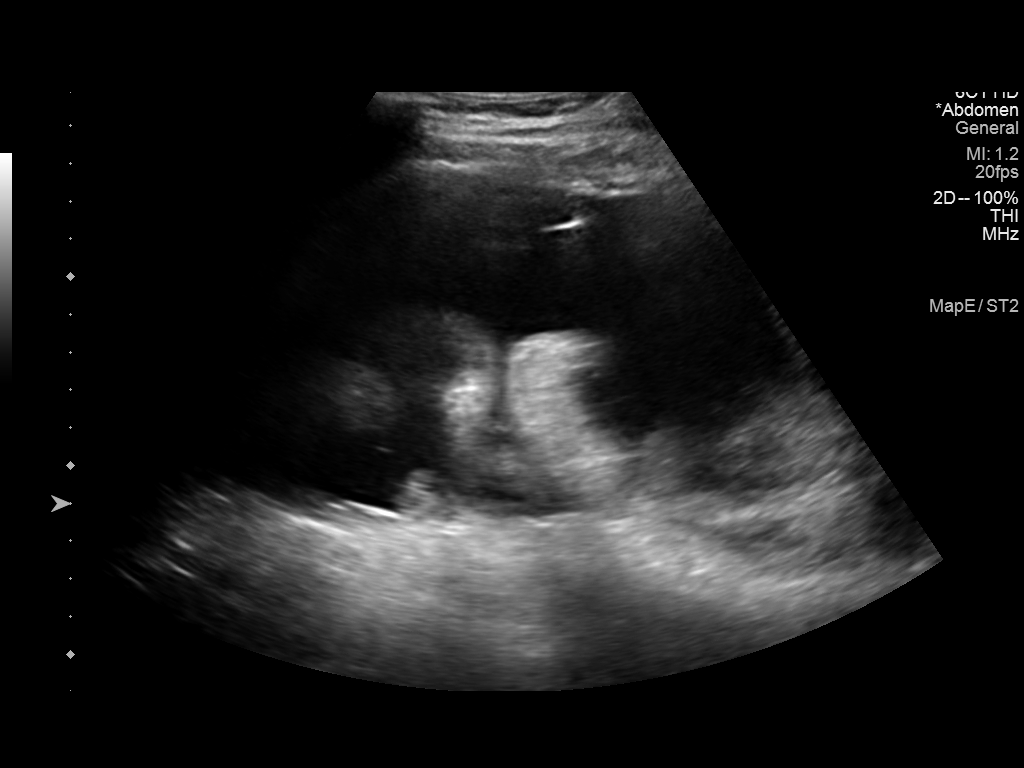
[im 6/6]
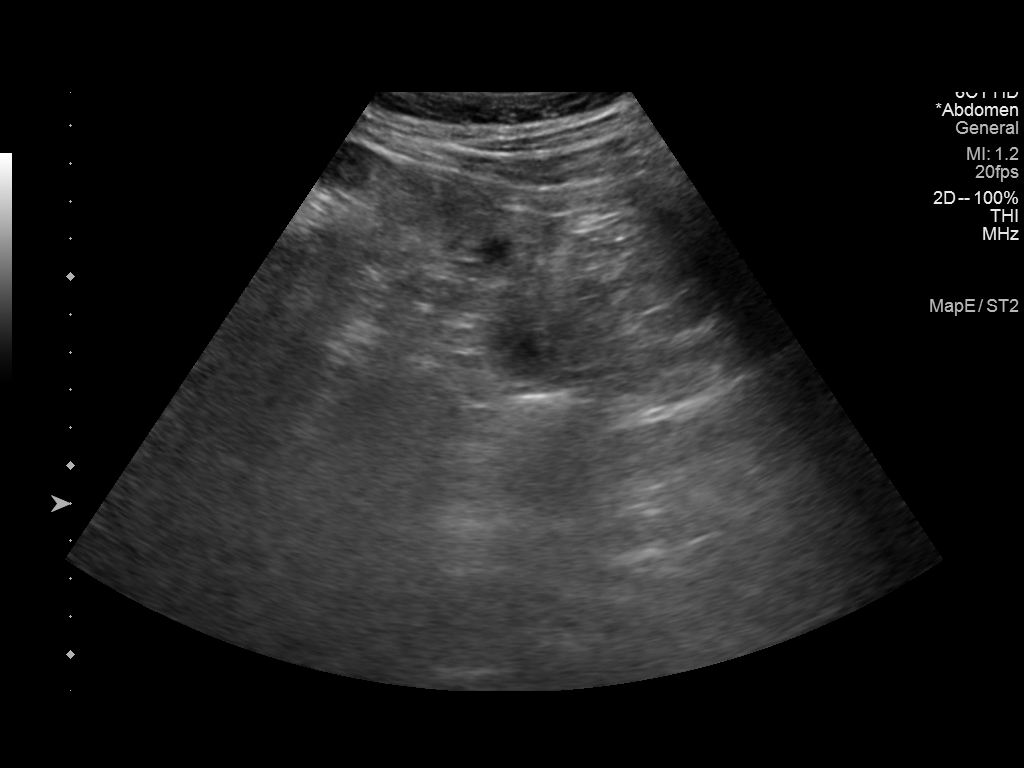

[6 of 6 positions shown; findings below may reference images not displayed]

EXAM:
ULTRASOUND GUIDED THERAPEUTIC PARACENTESIS

MEDICATIONS:
None.

COMPLICATIONS:
None immediate.

PROCEDURE:
Informed written consent was obtained from the patient after a
discussion of the risks, benefits and alternatives to treatment. A
timeout was performed prior to the initiation of the procedure.

Initial ultrasound scanning demonstrates a moderate amount of
ascites within the left lower abdominal quadrant. The left lower
abdomen was prepped and draped in the usual sterile fashion. 1%
lidocaine was used for local anesthesia.

Following this, a Yueh catheter was introduced. An ultrasound image
was saved for documentation purposes. The paracentesis was
performed. The catheter was removed and a dressing was applied. The
patient tolerated the procedure well without immediate post
procedural complication.
FINDINGS: A total of approximately 3.1 liters of turbid, amber fluid was
removed.
IMPRESSION: Successful ultrasound-guided therapeutic paracentesis yielding
liters of peritoneal fluid.
